# Patient Record
Sex: Female | Born: 1995 | Race: White | Hispanic: No | Marital: Single | Smoking: Current every day smoker
Health system: Southern US, Community
[De-identification: ages and names within clinical notes are randomized; demographics above are authoritative.]

## PROBLEM LIST (undated history)

## (undated) ENCOUNTER — Emergency Department: Admission: EM | Payer: Medicaid Other | Source: Home / Self Care

## (undated) ENCOUNTER — Emergency Department: Admission: EM | Payer: MEDICAID

## (undated) VITALS — BP 100/70 | HR 147 | Temp 97.7°F | Resp 17 | Ht 60.63 in | Wt 131.2 lb

## (undated) DIAGNOSIS — F909 Attention-deficit hyperactivity disorder, unspecified type: Secondary | ICD-10-CM

## (undated) DIAGNOSIS — J45909 Unspecified asthma, uncomplicated: Secondary | ICD-10-CM

## (undated) DIAGNOSIS — F419 Anxiety disorder, unspecified: Secondary | ICD-10-CM

## (undated) DIAGNOSIS — F4310 Post-traumatic stress disorder, unspecified: Secondary | ICD-10-CM

## (undated) DIAGNOSIS — F329 Major depressive disorder, single episode, unspecified: Secondary | ICD-10-CM

## (undated) DIAGNOSIS — F209 Schizophrenia, unspecified: Secondary | ICD-10-CM

## (undated) DIAGNOSIS — A6000 Herpesviral infection of urogenital system, unspecified: Secondary | ICD-10-CM

## (undated) DIAGNOSIS — B20 Human immunodeficiency virus [HIV] disease: Secondary | ICD-10-CM

## (undated) DIAGNOSIS — Z21 Asymptomatic human immunodeficiency virus [HIV] infection status: Secondary | ICD-10-CM

## (undated) HISTORY — DX: Herpesviral infection of urogenital system, unspecified: A60.00

## (undated) HISTORY — PX: NO PAST SURGERIES: SHX2092

## (undated) HISTORY — PX: COLONOSCOPY: SHX174

---
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
## 2016-04-26 DIAGNOSIS — Z7251 High risk heterosexual behavior: Secondary | ICD-10-CM | POA: Insufficient documentation

## 2016-06-17 DIAGNOSIS — W34010A Accidental discharge of airgun, initial encounter: Secondary | ICD-10-CM | POA: Insufficient documentation

## 2016-07-18 DIAGNOSIS — F603 Borderline personality disorder: Secondary | ICD-10-CM | POA: Insufficient documentation

## 2016-07-19 DIAGNOSIS — F061 Catatonic disorder due to known physiological condition: Secondary | ICD-10-CM | POA: Insufficient documentation

## 2016-07-19 DIAGNOSIS — S99922A Unspecified injury of left foot, initial encounter: Secondary | ICD-10-CM | POA: Insufficient documentation

## 2018-04-18 DIAGNOSIS — F1910 Other psychoactive substance abuse, uncomplicated: Secondary | ICD-10-CM | POA: Insufficient documentation

## 2018-07-26 DIAGNOSIS — F17210 Nicotine dependence, cigarettes, uncomplicated: Secondary | ICD-10-CM | POA: Insufficient documentation

## 2018-07-26 DIAGNOSIS — X838XXA Intentional self-harm by other specified means, initial encounter: Secondary | ICD-10-CM | POA: Insufficient documentation

## 2018-07-26 DIAGNOSIS — F1220 Cannabis dependence, uncomplicated: Secondary | ICD-10-CM | POA: Insufficient documentation

## 2019-02-21 ENCOUNTER — Ambulatory Visit: Payer: Medicaid Other | Attending: Infectious Diseases | Admitting: Infectious Diseases

## 2019-02-21 ENCOUNTER — Other Ambulatory Visit: Payer: Self-pay

## 2019-02-21 ENCOUNTER — Encounter: Payer: Self-pay | Admitting: Infectious Diseases

## 2019-02-21 VITALS — BP 106/73 | HR 96 | Temp 98.0°F | Ht 60.0 in | Wt 195.0 lb

## 2019-02-21 DIAGNOSIS — K219 Gastro-esophageal reflux disease without esophagitis: Secondary | ICD-10-CM

## 2019-02-21 DIAGNOSIS — F259 Schizoaffective disorder, unspecified: Secondary | ICD-10-CM

## 2019-02-21 DIAGNOSIS — I10 Essential (primary) hypertension: Secondary | ICD-10-CM | POA: Diagnosis not present

## 2019-02-21 DIAGNOSIS — Z91410 Personal history of adult physical and sexual abuse: Secondary | ICD-10-CM

## 2019-02-21 DIAGNOSIS — Z91013 Allergy to seafood: Secondary | ICD-10-CM

## 2019-02-21 DIAGNOSIS — J45909 Unspecified asthma, uncomplicated: Secondary | ICD-10-CM

## 2019-02-21 DIAGNOSIS — Z91010 Allergy to peanuts: Secondary | ICD-10-CM

## 2019-02-21 DIAGNOSIS — F913 Oppositional defiant disorder: Secondary | ICD-10-CM

## 2019-02-21 DIAGNOSIS — Z79899 Other long term (current) drug therapy: Secondary | ICD-10-CM

## 2019-02-21 DIAGNOSIS — G4700 Insomnia, unspecified: Secondary | ICD-10-CM

## 2019-02-21 DIAGNOSIS — B20 Human immunodeficiency virus [HIV] disease: Secondary | ICD-10-CM | POA: Insufficient documentation

## 2019-02-21 DIAGNOSIS — Z21 Asymptomatic human immunodeficiency virus [HIV] infection status: Secondary | ICD-10-CM

## 2019-02-21 DIAGNOSIS — F4310 Post-traumatic stress disorder, unspecified: Secondary | ICD-10-CM

## 2019-02-21 DIAGNOSIS — F603 Borderline personality disorder: Secondary | ICD-10-CM

## 2019-02-21 DIAGNOSIS — F17210 Nicotine dependence, cigarettes, uncomplicated: Secondary | ICD-10-CM

## 2019-02-21 DIAGNOSIS — F339 Major depressive disorder, recurrent, unspecified: Secondary | ICD-10-CM
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
## 2019-03-02 ENCOUNTER — Other Ambulatory Visit: Payer: Self-pay

## 2019-03-02 ENCOUNTER — Encounter: Payer: Self-pay | Admitting: Emergency Medicine

## 2019-03-02 ENCOUNTER — Emergency Department
Admission: EM | Admit: 2019-03-02 | Discharge: 2019-03-02 | Disposition: A | Discharge status: Home / Self Care | Payer: Medicaid Other | Attending: Emergency Medicine | Admitting: Emergency Medicine

## 2019-03-02 DIAGNOSIS — K625 Hemorrhage of anus and rectum: Secondary | ICD-10-CM | POA: Insufficient documentation

## 2019-03-02 DIAGNOSIS — Z5321 Procedure and treatment not carried out due to patient leaving prior to being seen by health care provider: Secondary | ICD-10-CM | POA: Insufficient documentation

## 2019-03-02 HISTORY — DX: Post-traumatic stress disorder, unspecified: F43.10

## 2019-03-02 HISTORY — DX: Asymptomatic human immunodeficiency virus (hiv) infection status: Z21

## 2019-03-02 HISTORY — DX: Schizophrenia, unspecified: F20.9

## 2019-03-02 HISTORY — DX: Major depressive disorder, single episode, unspecified: F32.9

## 2019-03-02 HISTORY — DX: Human immunodeficiency virus (HIV) disease: B20

## 2019-03-02 LAB — COMPREHENSIVE METABOLIC PANEL
ALT: 17 U/L (ref 0–44)
AST: 18 U/L (ref 15–41)
Albumin: 3.9 g/dL (ref 3.5–5.0)
Alkaline Phosphatase: 84 U/L (ref 38–126)
Anion gap: 6 (ref 5–15)
BUN: 15 mg/dL (ref 6–20)
CO2: 16 mmol/L — ABNORMAL LOW (ref 22–32)
Calcium: 8.5 mg/dL — ABNORMAL LOW (ref 8.9–10.3)
Chloride: 116 mmol/L — ABNORMAL HIGH (ref 98–111)
Creatinine, Ser: 0.72 mg/dL (ref 0.44–1.00)
GFR calc Af Amer: >60 mL/min (ref >60)
GFR calc non Af Amer: >60 mL/min (ref >60)
Glucose, Bld: 280 mg/dL — ABNORMAL HIGH (ref 70–99)
Potassium: 3.9 mmol/L (ref 3.5–5.1)
Sodium: 138 mmol/L (ref 135–145)
Total Bilirubin: 0.3 mg/dL (ref 0.3–1.2)
Total Protein: 7.4 g/dL (ref 6.5–8.1)

## 2019-03-02 LAB — TYPE AND SCREEN
ABO/RH(D): O NEG
Antibody Screen: NEGATIVE

## 2019-03-02 LAB — CBC
HCT: 38.8 % (ref 36.0–46.0)
Hemoglobin: 13.2 g/dL (ref 12.0–15.0)
MCH: 31 pg (ref 26.0–34.0)
MCHC: 34 g/dL (ref 30.0–36.0)
MCV: 91.1 fL (ref 80.0–100.0)
Platelets: 246 10*3/uL (ref 150–400)
RBC: 4.26 MIL/uL (ref 3.87–5.11)
RDW: 13.5 % (ref 11.5–15.5)
WBC: 6.4 10*3/uL (ref 4.0–10.5)
nRBC: 0 % (ref 0.0–0.2)

## 2019-03-02 NOTE — ED Triage Notes (Signed)
Pt to ED via POV c/o blood in stool. Pt states that she has had 3 BM's and there was a large amount of blood in all of them, pt states that she has pictures of her stools. Pt report pain with defecation. Pt is unsure if she has a hx/o hemorrhoids. Pt is in NAD at this time.

## 2019-03-02 NOTE — ED Notes (Signed)
Pt not present when rounding in [place]

## 2019-03-06 ENCOUNTER — Encounter: Payer: Self-pay | Admitting: Emergency Medicine

## 2019-03-06 ENCOUNTER — Emergency Department
Admission: EM | Admit: 2019-03-06 | Discharge: 2019-03-06 | Disposition: A | Discharge status: Home / Self Care | Payer: Medicaid Other | Attending: Emergency Medicine | Admitting: Emergency Medicine

## 2019-03-06 ENCOUNTER — Other Ambulatory Visit: Payer: Self-pay

## 2019-03-06 DIAGNOSIS — K625 Hemorrhage of anus and rectum: Secondary | ICD-10-CM | POA: Insufficient documentation

## 2019-03-06 DIAGNOSIS — J45909 Unspecified asthma, uncomplicated: Secondary | ICD-10-CM | POA: Diagnosis not present

## 2019-03-06 DIAGNOSIS — Z79899 Other long term (current) drug therapy: Secondary | ICD-10-CM | POA: Diagnosis not present

## 2019-03-06 DIAGNOSIS — Z21 Asymptomatic human immunodeficiency virus [HIV] infection status: Secondary | ICD-10-CM | POA: Insufficient documentation

## 2019-03-06 DIAGNOSIS — R739 Hyperglycemia, unspecified: Secondary | ICD-10-CM | POA: Insufficient documentation

## 2019-03-06 DIAGNOSIS — F17210 Nicotine dependence, cigarettes, uncomplicated: Secondary | ICD-10-CM | POA: Insufficient documentation

## 2019-03-06 LAB — CBC
HCT: 39.3 % (ref 36.0–46.0)
Hemoglobin: 13.6 g/dL (ref 12.0–15.0)
MCH: 31.6 pg (ref 26.0–34.0)
MCHC: 34.6 g/dL (ref 30.0–36.0)
MCV: 91.4 fL (ref 80.0–100.0)
Platelets: 246 10*3/uL (ref 150–400)
RBC: 4.3 MIL/uL (ref 3.87–5.11)
RDW: 13.6 % (ref 11.5–15.5)
WBC: 7.2 10*3/uL (ref 4.0–10.5)
nRBC: 0 % (ref 0.0–0.2)

## 2019-03-06 LAB — COMPREHENSIVE METABOLIC PANEL
ALT: 22 U/L (ref 0–44)
AST: 20 U/L (ref 15–41)
Albumin: 3.9 g/dL (ref 3.5–5.0)
Alkaline Phosphatase: 99 U/L (ref 38–126)
Anion gap: 9 (ref 5–15)
BUN: 14 mg/dL (ref 6–20)
CO2: 17 mmol/L — ABNORMAL LOW (ref 22–32)
Calcium: 8.7 mg/dL — ABNORMAL LOW (ref 8.9–10.3)
Chloride: 111 mmol/L (ref 98–111)
Creatinine, Ser: 0.77 mg/dL (ref 0.44–1.00)
GFR calc Af Amer: >60 mL/min (ref >60)
GFR calc non Af Amer: >60 mL/min (ref >60)
Glucose, Bld: 350 mg/dL — ABNORMAL HIGH (ref 70–99)
Potassium: 3.9 mmol/L (ref 3.5–5.1)
Sodium: 137 mmol/L (ref 135–145)
Total Bilirubin: 0.4 mg/dL (ref 0.3–1.2)
Total Protein: 7.6 g/dL (ref 6.5–8.1)

## 2019-03-06 LAB — POCT PREGNANCY, URINE: Preg Test, Ur: NEGATIVE
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
## 2019-03-06 NOTE — ED Notes (Addendum)
This RN spoke with Bethann Berkshire from [facility] regarding pt's DC, st pt's ride will be here in 15 minutes.

## 2019-03-06 NOTE — ED Notes (Signed)
This RN attempting to contact pt's legal guardian, Johnny Bridge at [phone] by calling 2x for permission to treat; unable to contact legal guardian, voicemail full; this Rn unable to leave a voicemail.

## 2019-03-06 NOTE — ED Triage Notes (Signed)
PT to ER with c/o blood in stools for last several days.  Pt states was here several days ago, but left due to long wait. Pt states pain with stools.

## 2019-03-06 NOTE — ED Notes (Addendum)
This RN contacted [facility]. This RN given permission to treat pt from [place]. Please call Royston Cowper at [phone] before DC.

## 2019-03-06 NOTE — ED Notes (Signed)
This RN spoke with Bethann Berkshire  regarding pt's DC

## 2019-03-27 ENCOUNTER — Emergency Department
Admission: EM | Admit: 2019-03-27 | Discharge: 2019-03-27 | Disposition: A | Discharge status: Home / Self Care | Payer: Medicaid Other | Attending: Emergency Medicine | Admitting: Emergency Medicine

## 2019-03-27 ENCOUNTER — Other Ambulatory Visit: Payer: Self-pay

## 2019-03-27 DIAGNOSIS — Z91010 Allergy to peanuts: Secondary | ICD-10-CM | POA: Diagnosis not present

## 2019-03-27 DIAGNOSIS — J45909 Unspecified asthma, uncomplicated: Secondary | ICD-10-CM | POA: Insufficient documentation

## 2019-03-27 DIAGNOSIS — R739 Hyperglycemia, unspecified: Secondary | ICD-10-CM | POA: Diagnosis not present

## 2019-03-27 DIAGNOSIS — Z79899 Other long term (current) drug therapy: Secondary | ICD-10-CM | POA: Diagnosis not present

## 2019-03-27 DIAGNOSIS — F909 Attention-deficit hyperactivity disorder, unspecified type: Secondary | ICD-10-CM | POA: Insufficient documentation

## 2019-03-27 DIAGNOSIS — F17210 Nicotine dependence, cigarettes, uncomplicated: Secondary | ICD-10-CM | POA: Insufficient documentation

## 2019-03-27 DIAGNOSIS — R42 Dizziness and giddiness: Secondary | ICD-10-CM | POA: Diagnosis present

## 2019-03-27 DIAGNOSIS — B20 Human immunodeficiency virus [HIV] disease: Secondary | ICD-10-CM

## 2019-03-27 LAB — BASIC METABOLIC PANEL
Anion gap: 10 (ref 5–15)
BUN: 16 mg/dL (ref 6–20)
CO2: 18 mmol/L — ABNORMAL LOW (ref 22–32)
Calcium: 9.1 mg/dL (ref 8.9–10.3)
Chloride: 108 mmol/L (ref 98–111)
Creatinine, Ser: 0.77 mg/dL (ref 0.44–1.00)
GFR calc Af Amer: >60 mL/min (ref >60)
GFR calc non Af Amer: >60 mL/min (ref >60)
Glucose, Bld: 211 mg/dL — ABNORMAL HIGH (ref 70–99)
Potassium: 3.9 mmol/L (ref 3.5–5.1)
Sodium: 136 mmol/L (ref 135–145)

## 2019-03-27 LAB — URINALYSIS, COMPLETE (UACMP) WITH MICROSCOPIC
Bilirubin Urine: NEGATIVE
Glucose, UA: 50 mg/dL — AB
Hgb urine dipstick: NEGATIVE
Ketones, ur: NEGATIVE mg/dL
Nitrite: NEGATIVE
Protein, ur: NEGATIVE mg/dL
Specific Gravity, Urine: 1.023 (ref 1.005–1.030)
pH: 6 (ref 5.0–8.0)

## 2019-03-27 LAB — HEMOGLOBIN A1C
Hgb A1c MFr Bld: 7.9 % — ABNORMAL HIGH (ref 4.8–5.6)
Mean Plasma Glucose: 180.03 mg/dL

## 2019-03-27 LAB — CBC
HCT: 39.9 % (ref 36.0–46.0)
Hemoglobin: 13.8 g/dL (ref 12.0–15.0)
MCH: 31.2 pg (ref 26.0–34.0)
MCHC: 34.6 g/dL (ref 30.0–36.0)
MCV: 90.3 fL (ref 80.0–100.0)
Platelets: 258 10*3/uL (ref 150–400)
RBC: 4.42 MIL/uL (ref 3.87–5.11)
RDW: 13.1 % (ref 11.5–15.5)
WBC: 6.6 10*3/uL (ref 4.0–10.5)
nRBC: 0 % (ref 0.0–0.2)

## 2019-03-27 LAB — GLUCOSE, CAPILLARY: Glucose-Capillary: 204 mg/dL — ABNORMAL HIGH (ref 70–99)

## 2019-03-27 LAB — POCT PREGNANCY, URINE: Preg Test, Ur: NEGATIVE

## 2019-03-27 MED ORDER — SODIUM CHLORIDE 0.9% FLUSH
3.0000 mL | Freq: Once | INTRAVENOUS | Status: AC
  Administered 2019-03-27: 19:00:00 3 mL via INTRAVENOUS

## 2019-03-27 MED ORDER — LACTATED RINGERS IV BOLUS
1000.0000 mL | Freq: Once | INTRAVENOUS | Status: AC
  Administered 2019-03-27: 18:00:00 1000 mL via INTRAVENOUS

## 2019-03-27 NOTE — Discharge Instructions (Signed)
Your elevated blood sugar is concerning for new onset diabetes.  We will hold off on starting any medication for this right now as there is risk for interaction with your HIV medications, but you should follow-up with your primary care doctor and also schedule an appointment for an endocrinologist.  In the meantime, you may manage your blood glucose levels with diet and exercise, please try to avoid sugary foods or large servings of carbohydrates.  Please return to the ER for new or worsening symptoms.

## 2019-03-27 NOTE — ED Notes (Signed)
Pt provided with meal tray by Claiborne Billings, RN.

## 2019-03-27 NOTE — ED Provider Notes (Signed)
[facility] Emergency Department Provider Note   ____________________________________________   First MD Initiated Contact with Patient 03/27/19 1728     (approximate)  I have reviewed the triage vital signs and the nursing notes.   HISTORY  Chief Complaint Dizziness    HPI Crystal Black is a 23 y.o. female with past medical history of schizophrenia, HIV, who presents to the ED complaining of dizziness.  Patient reports that she has been frequently been feeling dizzy and lightheaded over the past couple of weeks.  She was seen in the ED for similar symptoms a couple weeks ago, told that her blood sugar was elevated and she needed to follow-up with her PCP.  Patient states that her PCP saw her at the group home and she was post have more blood work done, but this has not occurred.  Patient states she does not have any follow-up scheduled with her PCP and she is not sure what to do next.  She states she has been checking her blood sugar recently and it has varied from 220-280.  She denies any fevers, chills, chest pain, cough, shortness of breath, abdominal pain, nausea, or vomiting.  She has not had any dysuria or hematuria.        Past Medical History:  Diagnosis Date  . ADHD (attention deficit hyperactivity disorder)   . Anxiety   . Asthma   . HIV (human immunodeficiency virus infection) (HCC)   . MDD (major depressive disorder)   . PTSD (post-traumatic stress disorder)   . Schizophrenia [facility](HCC)     Patient Active Problem List   Diagnosis Date Noted  . HIV disease (HCC) 02/21/2019  . Post traumatic stress disorder (PTSD) 11/21/2013  . ODD (oppositional defiant disorder) 11/21/2013  . MDD (major depressive disorder), recurrent episode, moderate (HCC) 11/20/2013    History reviewed. No pertinent surgical history.  Prior to Admission medications   Medication Sig Start Date End Date Taking? Authorizing Provider  albuterol (VENTOLIN HFA) 108 (90  Base) MCG/ACT inhaler Inhale 2 puffs into the lungs every 4 (four) hours.    [provider]  darunavir-cobicistat (PREZCOBIX) 800-150 MG tablet Take 1 tablet by mouth. 12/08/18   [provider]  divalproex (DEPAKOTE) 500 MG DR tablet Take 1 tablet by mouth 2 (two) times daily. 08/30/18   [provider]  dolutegravir (TIVICAY) 50 MG tablet Take 1 tablet by mouth daily. 12/11/18   [provider]  etonogestrel (NEXPLANON) 68 MG IMPL implant 68 mg by Subdermal route once.    [provider]  paliperidone (INVEGA SUSTENNA) 156 MG/ML SUSY injection Inject 156 mg into the muscle every 28 (twenty-eight) days.    [provider]  polyethylene glycol powder (GLYCOLAX/MIRALAX) 17 GM/SCOOP powder Take 17 g by mouth 2 (two) times daily as needed.    [provider]  risperiDONE (RISPERDAL) 1 MG tablet Take 1 tablet (1 mg total) by mouth daily. 11/27/13   Kendrick FriesBlankmann, Meghan, NP  risperiDONE (RISPERDAL) 2 MG tablet Take 1 tablet (2 mg total) by mouth at bedtime. 11/27/13   Kendrick FriesBlankmann, Meghan, NP  sertraline (ZOLOFT) 50 MG tablet Take 1 tablet (50 mg total) by mouth at bedtime. 11/27/13   Kendrick FriesBlankmann, Meghan, NP  traZODone (DESYREL) 100 MG tablet Take 100 mg by mouth at bedtime as needed for sleep.    [provider]    Allergies Fish allergy and Peanut oil  Family History  Problem Relation Age of Onset  . Drug abuse Mother  Social History Social History   Tobacco Use  . Smoking status: Current Every Day Smoker    Packs/day: 1.00    Years: 10.00    Pack years: 10.00    Types: Cigarettes  . Smokeless tobacco: Never Used  Substance Use Topics  . Alcohol use: No  . Drug use: No    Review of Systems  Constitutional: No fever/chills Eyes: No visual changes. ENT: No sore throat. Cardiovascular: Denies chest pain. Respiratory: Denies shortness of breath. Gastrointestinal: No abdominal pain.  No nausea, no vomiting.  No diarrhea.  No  constipation. Genitourinary: Negative for dysuria. Musculoskeletal: Negative for back pain. Skin: Negative for rash. Neurological: Negative for headaches, focal weakness or numbness.  Positive for dizziness.  ____________________________________________   PHYSICAL EXAM:  VITAL SIGNS: ED Triage Vitals  Enc Vitals Group     BP 03/27/19 1622 112/67     Pulse Rate 03/27/19 1622 95     Resp 03/27/19 1622 18     Temp 03/27/19 1622 98.2 F (36.8 C)     Temp Source 03/27/19 1622 Oral     SpO2 03/27/19 1622 97 %     Weight 03/27/19 1623 199 lb 1.2 oz (90.3 kg)     Height 03/27/19 1623 5\' 1"  (1.549 m)     Head Circumference --      Peak Flow --      Pain Score 03/27/19 1623 0     Pain Loc --      Pain Edu? --      Excl. in [place]? --     Constitutional: Alert and oriented. Eyes: Conjunctivae are normal. Head: Atraumatic. Nose: No congestion/rhinnorhea. Mouth/Throat: Mucous membranes are moist. Neck: Normal ROM Cardiovascular: Normal rate, regular rhythm. Grossly normal heart sounds. Respiratory: Normal respiratory effort.  No retractions. Lungs CTAB. Gastrointestinal: Soft and nontender. No distention. Genitourinary: deferred Musculoskeletal: No lower extremity tenderness nor edema. Neurologic:  Normal speech and language. No gross focal neurologic deficits are appreciated. Skin:  Skin is warm, dry and intact. No rash noted. Psychiatric: Mood and affect are normal. Speech and behavior are normal.  ____________________________________________   LABS (all labs ordered are listed, but only abnormal results are displayed)  Labs Reviewed  GLUCOSE, CAPILLARY - Abnormal; Notable for the following components:      Result Value   Glucose-Capillary 204 (*)    All other components within normal limits  BASIC METABOLIC PANEL - Abnormal; Notable for the following components:   CO2 18 (*)    Glucose, Bld 211 (*)    All other components within normal limits  URINALYSIS, COMPLETE  (UACMP) WITH MICROSCOPIC - Abnormal; Notable for the following components:   Color, Urine YELLOW (*)    APPearance CLOUDY (*)    Glucose, UA 50 (*)    Leukocytes,Ua SMALL (*)    Bacteria, UA MANY (*)    All other components within normal limits  CBC  HEMOGLOBIN A1C  CBG MONITORING, ED  POC URINE PREG, ED  POCT PREGNANCY, URINE   ____________________________________________  EKG  ED ECG REPORT I, Blake Divine, the attending physician, personally viewed and interpreted this ECG.   Date: 03/27/2019  EKG Time: 16:32  Rate: 92  Rhythm: normal sinus rhythm  Axis: Normal  Intervals:none  ST&T Change: None    PROCEDURES  Procedure(s) performed (including Critical Care):  Procedures   ____________________________________________   INITIAL IMPRESSION / ASSESSMENT AND PLAN / ED COURSE       23 year old female presenting to the ED  with 3 weeks of ongoing dizziness and lightheadedness, has repeatedly found herself to be hyperglycemic at home.  EKG without evidence of arrhythmia or ischemia, doubt cardiac etiology of symptoms.  Labs significant for hyperglycemia, patient has not yet been officially diagnosed with diabetes.  There is some mild acidosis, but no anion gap and UA is negative for ketones, doubt DKA.  Will hydrate with IV fluids as dehydration is likely contributing to lightheadedness.  Remainder of labs unremarkable.  Ideally, patient would be started on a medication for her apparent new onset diabetes, however this becomes more complicated with her HIV medications.  Counseled patient to focus on diet and exercise for now and will provide referral to endocrinology.  Also counseled patient to follow-up closely with her PCP.  Will send hemoglobin A1c.  Need for follow-up was discussed with manager at patient's group home, who understands and will assist with follow-up.      ____________________________________________   FINAL CLINICAL IMPRESSION(S) / ED DIAGNOSES   Final diagnoses:  Hyperglycemia  HIV disease [facility])     ED Discharge Orders    None       Note:  This document was prepared using Dragon voice recognition software and may include unintentional dictation errors.   Chesley Noon, MD 03/27/19 2002

## 2019-03-27 NOTE — ED Triage Notes (Signed)
Pt states her blood sugar has been elevated and her md was supposed to do more lab work but has not. Pt says she has been dizzy. Pt saw md last week and has been taking her blood sugar at the group home which pt states 200s. Pt states she was not placed on a special diet. Pt has been limiting sweets. Pt lives at a group home and staff were at ER.

## 2019-03-27 NOTE — ED Notes (Signed)
This RN spoke with the group home owner, Randal Buba, about pts follow up care with the endocrinologist. This RN stressed the importance of ensuring the pt goes to follow-up appointment to her. This RN also made her aware that she will be notified when pt is ready for discharge.

## 2019-03-27 NOTE — ED Notes (Signed)
Attempted to contact legal guardian Joaquin Music and was forwarded to voicemail. Randal Buba, owner of pts residence [place] called and notified of patients discharge. She stated that a vehicle would be here in approx 30 minutes to pick up the patient.

## 2019-03-27 NOTE — ED Notes (Signed)
Received permission from pt for this RN to speak to group home owner, Randal Buba, about follow up care with the endocrinologist.

## 2019-04-09 ENCOUNTER — Other Ambulatory Visit: Payer: Self-pay

## 2019-04-09 ENCOUNTER — Emergency Department
Admission: EM | Admit: 2019-04-09 | Discharge: 2019-04-09 | Disposition: A | Discharge status: Home / Self Care | Payer: Medicaid Other | Attending: Student in an Organized Health Care Education/Training Program | Admitting: Student in an Organized Health Care Education/Training Program

## 2019-04-09 DIAGNOSIS — R42 Dizziness and giddiness: Secondary | ICD-10-CM | POA: Diagnosis not present

## 2019-04-09 DIAGNOSIS — F17210 Nicotine dependence, cigarettes, uncomplicated: Secondary | ICD-10-CM | POA: Insufficient documentation

## 2019-04-09 DIAGNOSIS — Z79899 Other long term (current) drug therapy: Secondary | ICD-10-CM | POA: Diagnosis not present

## 2019-04-09 DIAGNOSIS — Z21 Asymptomatic human immunodeficiency virus [HIV] infection status: Secondary | ICD-10-CM | POA: Diagnosis not present

## 2019-04-09 DIAGNOSIS — N3090 Cystitis, unspecified without hematuria: Secondary | ICD-10-CM | POA: Diagnosis not present

## 2019-04-09 DIAGNOSIS — J45909 Unspecified asthma, uncomplicated: Secondary | ICD-10-CM | POA: Insufficient documentation

## 2019-04-09 DIAGNOSIS — E119 Type 2 diabetes mellitus without complications: Secondary | ICD-10-CM | POA: Insufficient documentation

## 2019-04-09 LAB — URINALYSIS, COMPLETE (UACMP) WITH MICROSCOPIC
Bilirubin Urine: NEGATIVE
Glucose, UA: NEGATIVE mg/dL
Hgb urine dipstick: NEGATIVE
Ketones, ur: NEGATIVE mg/dL
Nitrite: POSITIVE — AB
Protein, ur: NEGATIVE mg/dL
Specific Gravity, Urine: 1.014 (ref 1.005–1.030)
pH: 5 (ref 5.0–8.0)

## 2019-04-09 LAB — BASIC METABOLIC PANEL
Anion gap: 9 (ref 5–15)
BUN: 16 mg/dL (ref 6–20)
CO2: 17 mmol/L — ABNORMAL LOW (ref 22–32)
Calcium: 8.7 mg/dL — ABNORMAL LOW (ref 8.9–10.3)
Chloride: 111 mmol/L (ref 98–111)
Creatinine, Ser: 0.82 mg/dL (ref 0.44–1.00)
GFR calc Af Amer: >60 mL/min (ref >60)
GFR calc non Af Amer: >60 mL/min (ref >60)
Glucose, Bld: 90 mg/dL (ref 70–99)
Potassium: 3.6 mmol/L (ref 3.5–5.1)
Sodium: 137 mmol/L (ref 135–145)

## 2019-04-09 LAB — CBC
HCT: 39.6 % (ref 36.0–46.0)
Hemoglobin: 13.6 g/dL (ref 12.0–15.0)
MCH: 31.3 pg (ref 26.0–34.0)
MCHC: 34.3 g/dL (ref 30.0–36.0)
MCV: 91 fL (ref 80.0–100.0)
Platelets: 264 10*3/uL (ref 150–400)
RBC: 4.35 MIL/uL (ref 3.87–5.11)
RDW: 13.2 % (ref 11.5–15.5)
WBC: 7.1 10*3/uL (ref 4.0–10.5)
nRBC: 0 % (ref 0.0–0.2)

## 2019-04-09 LAB — GLUCOSE, CAPILLARY: Glucose-Capillary: 96 mg/dL (ref 70–99)

## 2019-04-09 LAB — POCT PREGNANCY, URINE: Preg Test, Ur: NEGATIVE

## 2019-04-09 MED ORDER — SODIUM CHLORIDE 0.9% FLUSH: 3.0000 mL | Freq: Once | INTRAVENOUS | Status: DC

## 2019-04-09 MED ORDER — CEPHALEXIN 500 MG PO CAPS
500.0000 mg | ORAL_CAPSULE | Freq: Once | ORAL | Status: AC
  Administered 2019-04-09: 500 mg via ORAL
  Filled 2019-04-09: qty 1

## 2019-04-09 MED ORDER — CEPHALEXIN 500 MG PO CAPS: 500.0000 mg | ORAL_CAPSULE | Freq: Three times a day (TID) | ORAL | 0 refills | Status: AC

## 2019-04-09 MED ORDER — SODIUM CHLORIDE 0.9 % IV BOLUS
1000.0000 mL | Freq: Once | INTRAVENOUS | Status: AC
  Administered 2019-04-09: 21:00:00 1000 mL via INTRAVENOUS

## 2019-04-09 NOTE — ED Provider Notes (Signed)
[facility]    First MD Initiated Contact with Patient 04/09/19 2033     (approximate)  I have reviewed the triage vital signs and the nursing notes.   HISTORY  Chief Complaint dizziness   HPI Crystal Black is a 23 y.o. female below listed medical history and recent diagnosis of type 2 diabetes presents the ER for lightheadedness.  Denies any pain.  No nausea or vomiting.  States she had decreased appetite today and has had increased urine frequency.  Was started on metformin by her PCP.  Has been compliant with that medication.  Denies any fevers.  No shortness of breath.  No diarrhea.   States that she has had some discomfort and dysuria with urination.   Past Medical History:  Diagnosis Date  . ADHD (attention deficit hyperactivity disorder)   . Anxiety   . Asthma   . HIV (human immunodeficiency virus infection) (HCC)   . MDD (major depressive disorder)   . PTSD (post-traumatic stress disorder)   . Schizophrenia (HCC)    Family History  Problem Relation Age of Onset  . Drug abuse Mother    History reviewed. No pertinent surgical history. Patient Active Problem List   Diagnosis Date Noted  . HIV disease (HCC) 02/21/2019  . Post traumatic stress disorder (PTSD) 11/21/2013  . ODD (oppositional defiant disorder) 11/21/2013  . MDD (major depressive disorder), recurrent episode, moderate (HCC) 11/20/2013      Prior to Admission medications   Medication Sig Start Date End Date Taking? Authorizing Provider  albuterol (VENTOLIN HFA) 108 (90 Base) MCG/ACT inhaler Inhale 2 puffs into the lungs every 4 (four) hours.    [provider]  darunavir-cobicistat (PREZCOBIX) 800-150 MG tablet Take 1 tablet by mouth. 12/08/18   [provider]  divalproex (DEPAKOTE) 500 MG DR tablet Take 1 tablet by mouth 2 (two) times daily. 08/30/18   [provider]  dolutegravir (TIVICAY) 50 MG tablet Take 1  tablet by mouth daily. 12/11/18   [provider]  etonogestrel (NEXPLANON) 68 MG IMPL implant 68 mg by Subdermal route once.    [provider]  paliperidone (INVEGA SUSTENNA) 156 MG/ML SUSY injection Inject 156 mg into the muscle every 28 (twenty-eight) days.    [provider]  polyethylene glycol powder (GLYCOLAX/MIRALAX) 17 GM/SCOOP powder Take 17 g by mouth 2 (two) times daily as needed.    [provider]  risperiDONE (RISPERDAL) 1 MG tablet Take 1 tablet (1 mg total) by mouth daily. 11/27/13   Kendrick FriesBlankmann, Meghan, NP  risperiDONE (RISPERDAL) 2 MG tablet Take 1 tablet (2 mg total) by mouth at bedtime. 11/27/13   Kendrick FriesBlankmann, Meghan, NP  sertraline (ZOLOFT) 50 MG tablet Take 1 tablet (50 mg total) by mouth at bedtime. 11/27/13   Kendrick FriesBlankmann, Meghan, NP  traZODone (DESYREL) 100 MG tablet Take 100 mg by mouth at bedtime as needed for sleep.    [provider]    Allergies Fish allergy and Peanut oil    Social History Social History   Tobacco Use  . Smoking status: Current Every Day Smoker    Packs/day: 1.00    Years: 10.00    Pack years: 10.00    Types: Cigarettes  . Smokeless tobacco: Never Used  Substance Use Topics  . Alcohol use: No  . Drug use: No    Review of Systems Patient denies headaches, rhinorrhea, blurry vision, numbness, shortness of breath, chest pain, edema, cough, abdominal pain, nausea,  vomiting, diarrhea, dysuria, fevers, rashes or hallucinations unless otherwise stated above in HPI. ____________________________________________   PHYSICAL EXAM:  VITAL SIGNS: Vitals:   04/09/19 2043 04/09/19 2044  BP: (!) 133/97   Pulse:  92  Resp:    Temp:    SpO2:  99%    Constitutional: Alert and oriented.  Eyes: Conjunctivae are normal.  Head: Atraumatic. Nose: No congestion/rhinnorhea. Mouth/Throat: Mucous membranes are moist.   Neck: No stridor. Painless ROM.  Cardiovascular: Normal rate, regular rhythm. Grossly normal  heart sounds.  Good peripheral circulation. Respiratory: Normal respiratory effort.  No retractions. Lungs CTAB. Gastrointestinal: Soft and nontender. No distention. No abdominal bruits. No CVA tenderness. Genitourinary:  Musculoskeletal: No lower extremity tenderness nor edema.  No joint effusions. Neurologic:  Normal speech and language. No gross focal neurologic deficits are appreciated. No facial droop Skin:  Skin is warm, dry and intact. No rash noted. Psychiatric: Mood and affect are normal. Speech and behavior are normal.  ____________________________________________   LABS (all labs ordered are listed, but only abnormal results are displayed)  Results for orders placed or performed during the hospital encounter of 04/09/19 (from the past 24 hour(s))  Glucose, capillary     Status: None   Collection Time: 04/09/19  6:57 PM  Result Value Ref Range   Glucose-Capillary 96 70 - 99 mg/dL  Basic metabolic panel     Status: Abnormal   Collection Time: 04/09/19  6:57 PM  Result Value Ref Range   Sodium 137 135 - 145 mmol/L   Potassium 3.6 3.5 - 5.1 mmol/L   Chloride 111 98 - 111 mmol/L   CO2 17 (L) 22 - 32 mmol/L   Glucose, Bld 90 70 - 99 mg/dL   BUN 16 6 - 20 mg/dL   Creatinine, Ser 0.26 0.44 - 1.00 mg/dL   Calcium 8.7 (L) 8.9 - 10.3 mg/dL   GFR calc non Af Amer >60 >60 mL/min   GFR calc Af Amer >60 >60 mL/min   Anion gap 9 5 - 15  CBC     Status: None   Collection Time: 04/09/19  6:57 PM  Result Value Ref Range   WBC 7.1 4.0 - 10.5 K/uL   RBC 4.35 3.87 - 5.11 MIL/uL   Hemoglobin 13.6 12.0 - 15.0 g/dL   HCT 37.8 58.8 - 50.2 %   MCV 91.0 80.0 - 100.0 fL   MCH 31.3 26.0 - 34.0 pg   MCHC 34.3 30.0 - 36.0 g/dL   RDW 77.4 12.8 - 78.6 %   Platelets 264 150 - 400 K/uL   nRBC 0.0 0.0 - 0.2 %  Urinalysis, Complete w Microscopic     Status: Abnormal   Collection Time: 04/09/19  6:57 PM  Result Value Ref Range   Color, Urine YELLOW (A) YELLOW   APPearance HAZY (A) CLEAR    Specific Gravity, Urine 1.014 1.005 - 1.030   pH 5.0 5.0 - 8.0   Glucose, UA NEGATIVE NEGATIVE mg/dL   Hgb urine dipstick NEGATIVE NEGATIVE   Bilirubin Urine NEGATIVE NEGATIVE   Ketones, ur NEGATIVE NEGATIVE mg/dL   Protein, ur NEGATIVE NEGATIVE mg/dL   Nitrite POSITIVE (A) NEGATIVE   Leukocytes,Ua SMALL (A) NEGATIVE   RBC / HPF 0-5 0 - 5 RBC/hpf   WBC, UA 0-5 0 - 5 WBC/hpf   Bacteria, UA MANY (A) NONE SEEN   Squamous Epithelial / LPF 6-10 0 - 5   Mucus PRESENT   Pregnancy, urine POC     Status:  None   Collection Time: 04/09/19  9:10 PM  Result Value Ref Range   Preg Test, Ur NEGATIVE NEGATIVE   ____________________________________________  EKG My review and personal interpretation at Time:   19:05 Indication: dizziness  Rate: 100  Rhythm: sinus Axis: normal Other: normal intervals, no stemi ____________________________________________  RADIOLOGY  I personally reviewed all radiographic images ordered to evaluate for the above acute complaints and reviewed radiology reports and findings.  These findings were personally discussed with the patient.  Please see medical record for radiology report.  ____________________________________________   PROCEDURES  Procedure(s) performed:  Procedures    Critical Care performed: no ____________________________________________   INITIAL IMPRESSION / ASSESSMENT AND PLAN / ED COURSE  Pertinent labs & imaging results that were available during my care of the patient were reviewed by me and considered in my medical decision making (see chart for details).   DDX: Dehydration, electrolyte abnormality, DKA, HHS, cystitis  Crystal Black is a 23 y.o. who presents to the ED with symptoms as described above.  Patient is well-appearing in no acute distress.  No signs of DKA.  Will give IV fluids.  Tolerating oral hydration.  Is complaining of some dysuria and does have evidence of cystitis.  No flank pain.  Not consistent with pyelonephritis.   Patient stable appropriate for outpatient follow-up.     The patient was evaluated in Emergency Department today for the symptoms described in the history of present illness. He/she was evaluated in the context of the global COVID-19 pandemic, which necessitated consideration that the patient might be at risk for infection with the SARS-CoV-2 virus that causes COVID-19. Institutional protocols and algorithms that pertain to the evaluation of patients at risk for COVID-19 are in a state of rapid change based on information released by regulatory bodies including the CDC and federal and state organizations. These policies and algorithms were followed during the patient's care in the ED.  As part of my medical decision making, I reviewed the following data within the [place] notes reviewed and incorporated, Labs reviewed, notes from prior ED visits and [place] Controlled Substance Database   ____________________________________________   FINAL CLINICAL IMPRESSION(S) / ED DIAGNOSES  Final diagnoses:  Dizziness  Cystitis      NEW MEDICATIONS STARTED DURING THIS VISIT:  New Prescriptions   No medications on file     Note:  This document was prepared using Dragon voice recognition software and may include unintentional dictation errors.    Merlyn Lot, MD 04/09/19 2138

## 2019-04-09 NOTE — ED Triage Notes (Signed)
Pt comes via POV from home with c/o possible hypoglycemic or hyperglycemic.  Pt states she was dx about 2 weeks ago with Type 2 diabetes. Pt states her Bp was low yesterday.  Pt states some dizziness and feeling like she might pass out.

## 2019-04-09 NOTE — ED Notes (Signed)
POC preg preformed with Negative results.

## 2019-04-09 NOTE — ED Notes (Signed)
Patient visualized in lobby in no acute distress. Awaiting room for MD eval. Lab results reviewed. Awaiting urine specimen from patient.

## 2019-04-09 NOTE — ED Notes (Signed)
[facility] made aware of patient being discharged. Worker at group states will have someone come and pick patient up.

## 2019-04-09 NOTE — ED Notes (Signed)
Diet sandwich tray given with a diet lemon lime soda.

## 2019-04-09 NOTE — ED Notes (Signed)
Attempted to call legal guardian Joaquin Music for pt. Left voicemail message with pt's status

## 2019-04-09 NOTE — ED Notes (Signed)
HIPPA compliant voicemail message left with Gaurdian

## 2019-04-09 NOTE — Discharge Instructions (Addendum)
Be sure to drink plenty of fluids.  Please start this antibiotic tomorrow morning as your urine does appear to have an infection.  This also may help with your symptoms.  Follow-up with PCP.  Return for any additional questions or concerns.

## 2019-04-09 NOTE — ED Notes (Signed)
Patient states she was recently dx with diabetes and started feeling dizzy today so she ate something. Patient stats that did not help and is afraid she is having adverse reactions with metformin and her HIV medications being mixed.

## 2019-05-28 ENCOUNTER — Other Ambulatory Visit
Admission: RE | Admit: 2019-05-28 | Discharge: 2019-05-28 | Disposition: A | Discharge status: Home / Self Care | Payer: Medicaid Other | Source: Ambulatory Visit | Attending: Internal Medicine | Admitting: Internal Medicine

## 2019-05-28 ENCOUNTER — Encounter: Payer: Self-pay | Admitting: *Deleted

## 2019-05-28 DIAGNOSIS — Z20828 Contact with and (suspected) exposure to other viral communicable diseases: Secondary | ICD-10-CM | POA: Diagnosis not present

## 2019-05-28 DIAGNOSIS — Z01812 Encounter for preprocedural laboratory examination: Secondary | ICD-10-CM | POA: Insufficient documentation

## 2019-05-28 LAB — SARS CORONAVIRUS 2 (TAT 6-24 HRS): SARS Coronavirus 2: NEGATIVE

## 2019-05-29 ENCOUNTER — Encounter
Admission: RE | Disposition: A | Discharge status: Home / Self Care | Payer: Self-pay | Source: Home / Self Care | Attending: Internal Medicine

## 2019-05-29 ENCOUNTER — Ambulatory Visit: Payer: Medicaid Other | Admitting: Anesthesiology

## 2019-05-29 ENCOUNTER — Ambulatory Visit
Admission: RE | Admit: 2019-05-29 | Discharge: 2019-05-29 | Disposition: A | Discharge status: Home / Self Care | Payer: Medicaid Other | Attending: Internal Medicine | Admitting: Internal Medicine

## 2019-05-29 DIAGNOSIS — F209 Schizophrenia, unspecified: Secondary | ICD-10-CM | POA: Insufficient documentation

## 2019-05-29 DIAGNOSIS — F329 Major depressive disorder, single episode, unspecified: Secondary | ICD-10-CM | POA: Diagnosis not present

## 2019-05-29 DIAGNOSIS — K640 First degree hemorrhoids: Secondary | ICD-10-CM | POA: Diagnosis not present

## 2019-05-29 DIAGNOSIS — F17210 Nicotine dependence, cigarettes, uncomplicated: Secondary | ICD-10-CM | POA: Diagnosis not present

## 2019-05-29 DIAGNOSIS — K625 Hemorrhage of anus and rectum: Secondary | ICD-10-CM | POA: Diagnosis present

## 2019-05-29 DIAGNOSIS — K921 Melena: Secondary | ICD-10-CM | POA: Diagnosis not present

## 2019-05-29 DIAGNOSIS — F4310 Post-traumatic stress disorder, unspecified: Secondary | ICD-10-CM | POA: Diagnosis not present

## 2019-05-29 DIAGNOSIS — F419 Anxiety disorder, unspecified: Secondary | ICD-10-CM | POA: Diagnosis not present

## 2019-05-29 DIAGNOSIS — Z21 Asymptomatic human immunodeficiency virus [HIV] infection status: Secondary | ICD-10-CM | POA: Diagnosis not present

## 2019-05-29 DIAGNOSIS — Z79899 Other long term (current) drug therapy: Secondary | ICD-10-CM | POA: Insufficient documentation

## 2019-05-29 HISTORY — PX: COLONOSCOPY WITH PROPOFOL: SHX5780

## 2019-05-29 HISTORY — DX: Post-traumatic stress disorder, unspecified: F43.10

## 2019-05-29 LAB — GLUCOSE, CAPILLARY: Glucose-Capillary: 90 mg/dL (ref 70–99)

## 2019-05-29 SURGERY — COLONOSCOPY WITH PROPOFOL
Anesthesia: General

## 2019-05-29 MED ORDER — SODIUM CHLORIDE 0.9 % IV SOLN
INTRAVENOUS | Status: DC
  Administered 2019-05-29: 11:00:00 via INTRAVENOUS

## 2019-05-29 MED ORDER — PROPOFOL 500 MG/50ML IV EMUL
INTRAVENOUS | Status: DC | PRN
  Administered 2019-05-29: 100 ug/kg/min via INTRAVENOUS

## 2019-05-29 MED ORDER — PHENYLEPHRINE HCL (PRESSORS) 10 MG/ML IV SOLN
INTRAVENOUS | Status: DC | PRN
  Administered 2019-05-29: 100 ug via INTRAVENOUS
  Administered 2019-05-29: 200 ug via INTRAVENOUS
  Administered 2019-05-29: 100 ug via INTRAVENOUS
  Administered 2019-05-29: 200 ug via INTRAVENOUS

## 2019-05-29 MED ORDER — PROPOFOL 500 MG/50ML IV EMUL
INTRAVENOUS | Status: AC
  Filled 2019-05-29: qty 50

## 2019-05-29 MED ORDER — LIDOCAINE HCL (PF) 1 % IJ SOLN
INTRAMUSCULAR | Status: AC
  Filled 2019-05-29: qty 2

## 2019-05-29 MED ORDER — PROPOFOL 10 MG/ML IV BOLUS
INTRAVENOUS | Status: DC | PRN
  Administered 2019-05-29: 40 mg via INTRAVENOUS

## 2019-05-29 NOTE — Interval H&P Note (Signed)
History and Physical Interval Note:  05/29/2019 10:32 AM  Crystal Black  has presented today for surgery, with the diagnosis of Rectal bleeding.  The various methods of treatment have been discussed with the patient and family. After consideration of risks, benefits and other options for treatment, the patient has consented to  Procedure(s): COLONOSCOPY WITH PROPOFOL (N/A) as a surgical intervention.  The patient's history has been reviewed, patient examined, no change in status, stable for surgery.  I have reviewed the patient's chart and labs.  Questions were answered to the patient's satisfaction.     [place], [place]
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
## 2019-05-29 NOTE — Anesthesia Post-op Follow-up Note (Signed)
Anesthesia QCDR form completed.        

## 2019-05-29 NOTE — Transfer of Care (Signed)
Immediate Anesthesia Transfer of Care Note  Patient: Crystal Black  Procedure(s) Performed: COLONOSCOPY WITH PROPOFOL (N/A )  Patient Location: PACU  Anesthesia Type:General  Level of Consciousness: sedated  Airway & Oxygen Therapy: Patient Spontanous Breathing and Patient connected to nasal cannula oxygen  Post-op Assessment: Report given to RN and Post -op Vital signs reviewed and stable  Post vital signs: Reviewed and stable  Last Vitals:  Vitals Value Taken Time  BP 102/54 05/29/19 1134  Temp 36.6 C 05/29/19 1132  Pulse 77 05/29/19 1138  Resp 12 05/29/19 1138  SpO2 100 % 05/29/19 1138  Vitals shown include unvalidated device data.  Last Pain:  Vitals:   05/29/19 1132  TempSrc: Temporal  PainSc: Asleep         Complications: No apparent anesthesia complications

## 2019-05-29 NOTE — Anesthesia Postprocedure Evaluation (Signed)
Anesthesia Post Note  Patient: Crystal Black  Procedure(s) Performed: COLONOSCOPY WITH PROPOFOL (N/A )  Patient location during evaluation: Endoscopy Anesthesia Type: General Level of consciousness: awake and alert and oriented Pain management: pain level controlled Vital Signs Assessment: post-procedure vital signs reviewed and stable Respiratory status: spontaneous breathing Cardiovascular status: blood pressure returned to baseline Anesthetic complications: no     Last Vitals:  Vitals:   05/29/19 0906 05/29/19 1132  BP: 109/70 (!) 102/54  Pulse: 95   Resp: 19   Temp: (!) 36.1 C 36.6 C  SpO2: 99%     Last Pain:  Vitals:   05/29/19 1152  TempSrc:   PainSc: 0-No pain                 Kalvin Buss

## 2019-05-29 NOTE — Anesthesia Preprocedure Evaluation (Addendum)
Anesthesia Evaluation  Patient identified by MRN, date of birth, ID band Patient awake    Reviewed: Allergy & Precautions, NPO status , Patient's Chart, lab work & pertinent test results  History of Anesthesia Complications Negative for: history of anesthetic complications  Airway Mallampati: II       Dental   Pulmonary asthma , neg sleep apnea, neg COPD, Current Smoker,           Cardiovascular (-) hypertension(-) Past MI and (-) CHF (-) dysrhythmias (-) Valvular Problems/Murmurs     Neuro/Psych neg Seizures Anxiety Depression Schizophrenia    GI/Hepatic Neg liver ROS, neg GERD  ,  Endo/Other  neg diabetes  Renal/GU negative Renal ROS     Musculoskeletal   Abdominal   Peds  Hematology   Anesthesia Other Findings   Reproductive/Obstetrics                            Anesthesia Physical Anesthesia Plan  ASA: III  Anesthesia Plan: General   Post-op Pain Management:    Induction: Intravenous  PONV Risk Score and Plan: 2 and TIVA and Propofol infusion  Airway Management Planned: Nasal Cannula  Additional Equipment:   Intra-op Plan:   Post-operative Plan:   Informed Consent: I have reviewed the patients History and Physical, chart, labs and discussed the procedure including the risks, benefits and alternatives for the proposed anesthesia with the patient or authorized representative who has indicated his/her understanding and acceptance.       Plan Discussed with:   Anesthesia Plan Comments:         Anesthesia Quick Evaluation

## 2019-05-29 NOTE — Op Note (Signed)
[facility] Gastroenterology Patient Name: Crystal Black Procedure Date: 05/29/2019 11:09 AM MRN: [id number] Account #: [id number] Date of Birth: 12/04/1995 Admit Type: Outpatient Age: [age] [facility] Gender: Female Note Status: Finalized Procedure:             Colonoscopy Indications:           Hematochezia Providers:             Boykin Nearingeodoro K. Norma Fredricksonoledo MD, MD Referring MD:          No Local Md, MD (Referring MD) Medicines:             Propofol per Anesthesia Complications:         No immediate complications. Procedure:             Pre-Anesthesia Assessment:                        - The risks and benefits of the procedure and the                         sedation options and risks were discussed with the                         patient. All questions were answered and informed                         consent was obtained.                        - Patient identification and proposed procedure were                         verified prior to the procedure by the nurse. The                         procedure was verified in the procedure room.                        - ASA Grade Assessment: II - A patient with mild                         systemic disease.                        - After reviewing the risks and benefits, the patient                         was deemed in satisfactory condition to undergo the                         procedure.                        After obtaining informed consent, the colonoscope was                         passed under direct vision. Throughout the procedure,                         the patient's blood pressure, pulse, and oxygen  saturations were monitored continuously. The                         Colonoscope was introduced through the anus and                         advanced to the the terminal ileum, with                         identification of the appendiceal orifice and IC   valve. The colonoscopy was performed without                         difficulty. The patient tolerated the procedure well.                         The quality of the bowel preparation was adequate. Findings:      The perianal and digital rectal examinations were normal. Pertinent       negatives include normal sphincter tone and no palpable rectal lesions.      Non-bleeding internal hemorrhoids were found during retroflexion. The       hemorrhoids were Grade I (internal hemorrhoids that do not prolapse).      The terminal ileum appeared normal. Biopsies were taken with a cold       forceps for histology.      Normal mucosa was found in the entire colon. Biopsies for histology were       taken with a cold forceps from the right colon, left colon and rectum       for evaluation of microscopic colitis.      The exam was otherwise without abnormality on direct and retroflexion       views. Impression:            - Non-bleeding internal hemorrhoids.                        - The examined portion of the ileum was normal.                         Biopsied.                        - Normal mucosa in the entire examined colon. Biopsied.                        - The examination was otherwise normal on direct and                         retroflexion views. Recommendation:        - Patient has a contact number available for                         emergencies. The signs and symptoms of potential                         delayed complications were discussed with the patient.                         Return to normal activities tomorrow. Written  discharge instructions were provided to the patient.                        - High fiber diet.                        - Continue present medications.                        - Await pathology results.                        - Return to GI office PRN.                        - The findings and recommendations were discussed with                          the designated responsible adult.                        - The findings and recommendations were discussed with                         the patient. Procedure Code(s):     --- Professional ---                        [phone], Colonoscopy, flexible; with biopsy, single or                         multiple Diagnosis Code(s):     --- Professional ---                        K92.1, Melena (includes Hematochezia)                        K64.0, First degree hemorrhoids CPT copyright 2019 American Medical Association. All rights reserved. The codes documented in this report are preliminary and upon coder review may  be revised to meet current compliance requirements. Stanton Kidney MD, MD 05/29/2019 11:31:27 AM This report has been signed electronically. Number of Addenda: 0 Note Initiated On: 05/29/2019 11:09 AM Scope Withdrawal Time: 0 hours 7 minutes 46 seconds  Total Procedure Duration: 0 hours 12 minutes 49 seconds  Estimated Blood Loss:  Estimated blood loss: none.      [facility]

## 2019-05-30 ENCOUNTER — Encounter: Payer: Self-pay | Admitting: Internal Medicine

## 2019-05-30 LAB — SURGICAL PATHOLOGY

## 2019-06-23 ENCOUNTER — Other Ambulatory Visit: Payer: Self-pay

## 2019-06-23 ENCOUNTER — Emergency Department
Admission: EM | Admit: 2019-06-23 | Discharge: 2019-06-23 | Disposition: A | Discharge status: Home / Self Care | Payer: Medicaid Other | Attending: Emergency Medicine | Admitting: Emergency Medicine

## 2019-06-23 ENCOUNTER — Encounter: Payer: Self-pay | Admitting: Emergency Medicine

## 2019-06-23 DIAGNOSIS — J029 Acute pharyngitis, unspecified: Secondary | ICD-10-CM | POA: Diagnosis not present

## 2019-06-23 DIAGNOSIS — F17210 Nicotine dependence, cigarettes, uncomplicated: Secondary | ICD-10-CM | POA: Insufficient documentation

## 2019-06-23 DIAGNOSIS — J45909 Unspecified asthma, uncomplicated: Secondary | ICD-10-CM | POA: Insufficient documentation

## 2019-06-23 DIAGNOSIS — R112 Nausea with vomiting, unspecified: Secondary | ICD-10-CM | POA: Insufficient documentation

## 2019-06-23 DIAGNOSIS — Z20822 Contact with and (suspected) exposure to covid-19: Secondary | ICD-10-CM

## 2019-06-23 DIAGNOSIS — M791 Myalgia, unspecified site: Secondary | ICD-10-CM | POA: Insufficient documentation

## 2019-06-23 DIAGNOSIS — Z20828 Contact with and (suspected) exposure to other viral communicable diseases: Secondary | ICD-10-CM | POA: Insufficient documentation

## 2019-06-23 DIAGNOSIS — R05 Cough: Secondary | ICD-10-CM | POA: Diagnosis not present

## 2019-06-23 LAB — INFLUENZA PANEL BY PCR (TYPE A & B)
Influenza A By PCR: NEGATIVE
Influenza B By PCR: NEGATIVE

## 2019-06-23 MED ORDER — ONDANSETRON 4 MG PO TBDP: 4.0000 mg | ORAL_TABLET | Freq: Three times a day (TID) | ORAL | 0 refills | Status: DC | PRN

## 2019-06-23 MED ORDER — PSEUDOEPH-BROMPHEN-DM 30-2-10 MG/5ML PO SYRP: 10.0000 mL | ORAL_SOLUTION | Freq: Four times a day (QID) | ORAL | 0 refills | Status: DC | PRN

## 2019-06-23 NOTE — ED Triage Notes (Signed)
Pt via POV from complaining of flu-like symptoms that started yesterday. Pt c/o productive cough, generalized body aches, stomach ache, and headache. Pt would like to be tested for the flu and COVID. Pt states she has been in contact with someone who had COVID.   Pt NAD and A&Ox4 at this time.
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
## 2019-06-23 NOTE — ED Notes (Signed)
This Rn attempted to call legal guardian, no answer. Called caregiver, Randal Buba, who knew that pt is in ED and gave consent for testing and treatment.

## 2019-06-25 ENCOUNTER — Telehealth: Payer: Self-pay

## 2019-06-25 LAB — NOVEL CORONAVIRUS, NAA (HOSP ORDER, SEND-OUT TO REF LAB; TAT 18-24 HRS): SARS-CoV-2, NAA: NOT DETECTED

## 2019-06-25 NOTE — Telephone Encounter (Signed)
Caller given negative result and verbalized understanding  

## 2019-07-23 ENCOUNTER — Encounter: Payer: Self-pay | Admitting: Emergency Medicine

## 2019-07-23 ENCOUNTER — Emergency Department
Admission: EM | Admit: 2019-07-23 | Discharge: 2019-07-23 | Disposition: A | Discharge status: Home / Self Care | Payer: Medicaid Other | Attending: Emergency Medicine | Admitting: Emergency Medicine

## 2019-07-23 DIAGNOSIS — J45909 Unspecified asthma, uncomplicated: Secondary | ICD-10-CM | POA: Diagnosis not present

## 2019-07-23 DIAGNOSIS — F4310 Post-traumatic stress disorder, unspecified: Secondary | ICD-10-CM | POA: Insufficient documentation

## 2019-07-23 DIAGNOSIS — F419 Anxiety disorder, unspecified: Secondary | ICD-10-CM | POA: Diagnosis not present

## 2019-07-23 DIAGNOSIS — B20 Human immunodeficiency virus [HIV] disease: Secondary | ICD-10-CM | POA: Diagnosis not present

## 2019-07-23 DIAGNOSIS — F17210 Nicotine dependence, cigarettes, uncomplicated: Secondary | ICD-10-CM | POA: Insufficient documentation

## 2019-07-23 DIAGNOSIS — F09 Unspecified mental disorder due to known physiological condition: Secondary | ICD-10-CM | POA: Diagnosis present

## 2019-07-23 DIAGNOSIS — F909 Attention-deficit hyperactivity disorder, unspecified type: Secondary | ICD-10-CM | POA: Insufficient documentation

## 2019-07-23 DIAGNOSIS — Z79899 Other long term (current) drug therapy: Secondary | ICD-10-CM | POA: Diagnosis not present

## 2019-07-23 DIAGNOSIS — Z91010 Allergy to peanuts: Secondary | ICD-10-CM | POA: Insufficient documentation

## 2019-07-23 LAB — URINE DRUG SCREEN, QUALITATIVE (ARMC ONLY)
Amphetamines, Ur Screen: NOT DETECTED
Barbiturates, Ur Screen: NOT DETECTED
Benzodiazepine, Ur Scrn: NOT DETECTED
Cannabinoid 50 Ng, Ur ~~LOC~~: NOT DETECTED
Cocaine Metabolite,Ur ~~LOC~~: NOT DETECTED
MDMA (Ecstasy)Ur Screen: NOT DETECTED
Methadone Scn, Ur: NOT DETECTED
Opiate, Ur Screen: NOT DETECTED
Phencyclidine (PCP) Ur S: NOT DETECTED
Tricyclic, Ur Screen: NOT DETECTED

## 2019-07-23 LAB — COMPREHENSIVE METABOLIC PANEL
ALT: 29 U/L (ref 0–44)
AST: 24 U/L (ref 15–41)
Albumin: 3.9 g/dL (ref 3.5–5.0)
Alkaline Phosphatase: 71 U/L (ref 38–126)
Anion gap: 8 (ref 5–15)
BUN: 19 mg/dL (ref 6–20)
CO2: 19 mmol/L — ABNORMAL LOW (ref 22–32)
Calcium: 8.7 mg/dL — ABNORMAL LOW (ref 8.9–10.3)
Chloride: 112 mmol/L — ABNORMAL HIGH (ref 98–111)
Creatinine, Ser: 0.75 mg/dL (ref 0.44–1.00)
GFR calc Af Amer: >60 mL/min (ref >60)
GFR calc non Af Amer: >60 mL/min (ref >60)
Glucose, Bld: 131 mg/dL — ABNORMAL HIGH (ref 70–99)
Potassium: 3.8 mmol/L (ref 3.5–5.1)
Sodium: 139 mmol/L (ref 135–145)
Total Bilirubin: 0.4 mg/dL (ref 0.3–1.2)
Total Protein: 7.6 g/dL (ref 6.5–8.1)

## 2019-07-23 LAB — CBC
HCT: 39.1 % (ref 36.0–46.0)
Hemoglobin: 13.6 g/dL (ref 12.0–15.0)
MCH: 31.3 pg (ref 26.0–34.0)
MCHC: 34.8 g/dL (ref 30.0–36.0)
MCV: 89.9 fL (ref 80.0–100.0)
Platelets: 245 10*3/uL (ref 150–400)
RBC: 4.35 MIL/uL (ref 3.87–5.11)
RDW: 12.3 % (ref 11.5–15.5)
WBC: 8.7 10*3/uL (ref 4.0–10.5)
nRBC: 0 % (ref 0.0–0.2)

## 2019-07-23 LAB — SALICYLATE LEVEL: Salicylate Lvl: <7 mg/dL — ABNORMAL LOW (ref 7.0–30.0)

## 2019-07-23 LAB — ACETAMINOPHEN LEVEL: Acetaminophen (Tylenol), Serum: <10 ug/mL — ABNORMAL LOW (ref 10–30)

## 2019-07-23 LAB — ETHANOL: Alcohol, Ethyl (B): <10 mg/dL (ref <10)

## 2019-07-23 LAB — POCT PREGNANCY, URINE: Preg Test, Ur: NEGATIVE

## 2019-07-23 MED ORDER — FLUOXETINE HCL 20 MG PO CAPS: 20.0000 mg | ORAL_CAPSULE | Freq: Every day | ORAL | 0 refills | Status: DC

## 2019-07-23 MED ORDER — PRAZOSIN HCL 1 MG PO CAPS: 1.0000 mg | ORAL_CAPSULE | Freq: Every day | ORAL | 0 refills | Status: DC

## 2019-07-23 NOTE — ED Notes (Signed)
Called number for legal guardian Purvis Kilts [phone]) left message on voicemail. Called number on voicemail [phone] for 24 hr emergency line to notify patient was seen here in ED and was to be discharge. Left message with Magdalene River.  This writer has called [facility] to notify patient is being discharge and needs ride home. Unable to make contact at this time will try again.

## 2019-07-23 NOTE — ED Notes (Signed)
Called caregiver again.  She says she is coming now from [place].
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
## 2019-07-23 NOTE — ED Notes (Signed)
Report given to [facility]. Patient moved to interview room and SOC in progress.

## 2019-07-23 NOTE — Discharge Instructions (Addendum)
1.  The psychiatrist tonight recommends the following medicines for the next 2 weeks: Prozac 20 mg daily Prazosin 1 mg nightly 2.  Follow-up with your ACT team today. 3.  Return to the ER for worsening symptoms, feelings of hurting yourself or others, or other concerns.

## 2019-07-23 NOTE — ED Notes (Signed)
Crystal Black guardian called and gave me number for home--[phone].  Called and they will come pick her up around 10-1030am.

## 2019-07-23 NOTE — ED Notes (Signed)

## 2019-07-23 NOTE — ED Notes (Signed)
Called guardian number.  Unable to leave message as voicemail is full.  I called the emergency /off hour number [phone] and spoke to [place].  She says she will contact guardian and group home and they are aware she is being discharged.
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
## 2019-07-23 NOTE — ED Notes (Signed)
Pt belongings:  1 black pant 1 white shirt 1 black jacket 1 pair black/red shoes 1 brown brief 1 phone charger 1 phone 1 cigarette lighter 1 pack cigarettes

## 2019-09-13 ENCOUNTER — Encounter: Payer: Self-pay | Admitting: Emergency Medicine

## 2019-09-13 ENCOUNTER — Other Ambulatory Visit: Payer: Self-pay

## 2019-09-13 DIAGNOSIS — K2900 Acute gastritis without bleeding: Secondary | ICD-10-CM | POA: Diagnosis not present

## 2019-09-13 DIAGNOSIS — Z79899 Other long term (current) drug therapy: Secondary | ICD-10-CM | POA: Insufficient documentation

## 2019-09-13 DIAGNOSIS — J45909 Unspecified asthma, uncomplicated: Secondary | ICD-10-CM | POA: Diagnosis not present

## 2019-09-13 DIAGNOSIS — R1011 Right upper quadrant pain: Secondary | ICD-10-CM | POA: Diagnosis present

## 2019-09-13 DIAGNOSIS — Z21 Asymptomatic human immunodeficiency virus [HIV] infection status: Secondary | ICD-10-CM | POA: Diagnosis not present

## 2019-09-13 DIAGNOSIS — F909 Attention-deficit hyperactivity disorder, unspecified type: Secondary | ICD-10-CM | POA: Insufficient documentation

## 2019-09-13 DIAGNOSIS — Z91010 Allergy to peanuts: Secondary | ICD-10-CM | POA: Insufficient documentation

## 2019-09-13 DIAGNOSIS — Z793 Long term (current) use of hormonal contraceptives: Secondary | ICD-10-CM | POA: Diagnosis not present

## 2019-09-13 DIAGNOSIS — F17210 Nicotine dependence, cigarettes, uncomplicated: Secondary | ICD-10-CM | POA: Diagnosis not present

## 2019-09-13 LAB — COMPREHENSIVE METABOLIC PANEL
ALT: 25 U/L (ref 0–44)
AST: 21 U/L (ref 15–41)
Albumin: 4.1 g/dL (ref 3.5–5.0)
Alkaline Phosphatase: 64 U/L (ref 38–126)
Anion gap: 9 (ref 5–15)
BUN: 22 mg/dL — ABNORMAL HIGH (ref 6–20)
CO2: 17 mmol/L — ABNORMAL LOW (ref 22–32)
Calcium: 8.8 mg/dL — ABNORMAL LOW (ref 8.9–10.3)
Chloride: 114 mmol/L — ABNORMAL HIGH (ref 98–111)
Creatinine, Ser: 0.98 mg/dL (ref 0.44–1.00)
GFR calc Af Amer: >60 mL/min (ref >60)
GFR calc non Af Amer: >60 mL/min (ref >60)
Glucose, Bld: 99 mg/dL (ref 70–99)
Potassium: 3.9 mmol/L (ref 3.5–5.1)
Sodium: 140 mmol/L (ref 135–145)
Total Bilirubin: 0.5 mg/dL (ref 0.3–1.2)
Total Protein: 7.6 g/dL (ref 6.5–8.1)

## 2019-09-13 LAB — CBC
HCT: 41.2 % (ref 36.0–46.0)
Hemoglobin: 13.9 g/dL (ref 12.0–15.0)
MCH: 31.1 pg (ref 26.0–34.0)
MCHC: 33.7 g/dL (ref 30.0–36.0)
MCV: 92.2 fL (ref 80.0–100.0)
Platelets: 255 10*3/uL (ref 150–400)
RBC: 4.47 MIL/uL (ref 3.87–5.11)
RDW: 13.1 % (ref 11.5–15.5)
WBC: 6.3 10*3/uL (ref 4.0–10.5)
nRBC: 0 % (ref 0.0–0.2)

## 2019-09-13 LAB — LIPASE, BLOOD: Lipase: 29 U/L (ref 11–51)

## 2019-09-13 NOTE — ED Notes (Signed)
Messaged left for patient's legal guardian Purvis Kilts [phone].

## 2019-09-13 NOTE — ED Triage Notes (Addendum)
Patient with complaint of upper abdominal pain that is a stabbing pain that started three days ago. Patient states that yesterday she developed a headache, dizziness and nausea. Patient states that three days ago she smoked 40 cigarettes and has been short of breath since.

## 2019-09-14 ENCOUNTER — Emergency Department: Payer: Medicaid Other

## 2019-09-14 ENCOUNTER — Emergency Department
Admission: EM | Admit: 2019-09-14 | Discharge: 2019-09-14 | Disposition: A | Discharge status: Home / Self Care | Payer: Medicaid Other | Attending: Emergency Medicine | Admitting: Emergency Medicine

## 2019-09-14 DIAGNOSIS — K2900 Acute gastritis without bleeding: Secondary | ICD-10-CM

## 2019-09-14 DIAGNOSIS — F17200 Nicotine dependence, unspecified, uncomplicated: Secondary | ICD-10-CM

## 2019-09-14 DIAGNOSIS — R1011 Right upper quadrant pain: Secondary | ICD-10-CM

## 2019-09-14 LAB — URINALYSIS, COMPLETE (UACMP) WITH MICROSCOPIC
Bilirubin Urine: NEGATIVE
Glucose, UA: NEGATIVE mg/dL
Hgb urine dipstick: NEGATIVE
Ketones, ur: 5 mg/dL — AB
Leukocytes,Ua: NEGATIVE
Nitrite: NEGATIVE
Protein, ur: 30 mg/dL — AB
Specific Gravity, Urine: 1.029 (ref 1.005–1.030)
pH: 5 (ref 5.0–8.0)

## 2019-09-14 LAB — POC URINE PREG, ED: Preg Test, Ur: NEGATIVE

## 2019-09-14 LAB — TROPONIN I (HIGH SENSITIVITY): Troponin I (High Sensitivity): <2 ng/L (ref <18)

## 2019-09-14 LAB — GROUP A STREP BY PCR: Group A Strep by PCR: NOT DETECTED

## 2019-09-14 LAB — FIBRIN DERIVATIVES D-DIMER (ARMC ONLY): Fibrin derivatives D-dimer (ARMC): 209.8 ng/mL (FEU) (ref 0.00–499.00)

## 2019-09-14 MED ORDER — KETOROLAC TROMETHAMINE 30 MG/ML IJ SOLN
15.0000 mg | Freq: Once | INTRAMUSCULAR | Status: AC
  Administered 2019-09-14: 15 mg via INTRAVENOUS
  Filled 2019-09-14: qty 1

## 2019-09-14 MED ORDER — ACETAMINOPHEN 500 MG PO TABS
1000.0000 mg | ORAL_TABLET | Freq: Once | ORAL | Status: AC
  Administered 2019-09-14: 03:00:00 1000 mg via ORAL
  Filled 2019-09-14: qty 2

## 2019-09-14 MED ORDER — SUCRALFATE 1 G PO TABS
1.0000 g | ORAL_TABLET | Freq: Once | ORAL | Status: AC
  Administered 2019-09-14: 1 g via ORAL
  Filled 2019-09-14: qty 1

## 2019-09-14 MED ORDER — PANTOPRAZOLE SODIUM 40 MG PO TBEC
40.0000 mg | DELAYED_RELEASE_TABLET | Freq: Once | ORAL | Status: AC
  Administered 2019-09-14: 40 mg via ORAL
  Filled 2019-09-14: qty 1

## 2019-09-14 MED ORDER — SODIUM CHLORIDE 0.9 % IV BOLUS
1000.0000 mL | Freq: Once | INTRAVENOUS | Status: AC
  Administered 2019-09-14: 1000 mL via INTRAVENOUS

## 2019-09-14 MED ORDER — ONDANSETRON 4 MG PO TBDP: 4.0000 mg | ORAL_TABLET | Freq: Three times a day (TID) | ORAL | 0 refills | Status: DC | PRN

## 2019-09-14 MED ORDER — LIDOCAINE VISCOUS HCL 2 % MT SOLN
15.0000 mL | Freq: Once | OROMUCOSAL | Status: AC
  Administered 2019-09-14: 15 mL via ORAL
  Filled 2019-09-14: qty 15

## 2019-09-14 MED ORDER — ONDANSETRON HCL 4 MG/2ML IJ SOLN
4.0000 mg | Freq: Once | INTRAMUSCULAR | Status: AC
  Administered 2019-09-14: 4 mg via INTRAVENOUS
  Filled 2019-09-14: qty 2

## 2019-09-14 MED ORDER — SUCRALFATE 1 G PO TABS: 1.0000 g | ORAL_TABLET | Freq: Three times a day (TID) | ORAL | 0 refills | Status: DC

## 2019-09-14 MED ORDER — ALUM & MAG HYDROXIDE-SIMETH 200-200-20 MG/5ML PO SUSP
30.0000 mL | Freq: Once | ORAL | Status: AC
  Administered 2019-09-14: 03:00:00 30 mL via ORAL
  Filled 2019-09-14: qty 30

## 2019-09-14 MED ORDER — PANTOPRAZOLE SODIUM 20 MG PO TBEC: 20.0000 mg | DELAYED_RELEASE_TABLET | Freq: Every day | ORAL | 0 refills | Status: DC

## 2019-09-14 NOTE — Discharge Instructions (Addendum)
This is most likely secondary to inflammation of your stomach given the pain is in your upper middle abdomen.  You can take a Protonix to help decrease the acid, Carafate to help line the stomach and Zofran to help with nausea.  You should stop smoking because this can cause worsening gastritis.  Return to the ER if you develop lower abdominal pain, fevers, worsening pain or any other concerns  MPRESSION: 1. The gallbladder and common bile duct are normal. 2. Heterogeneous increased echo texture in the liver is nonspecific but often seen with hepatic steatosis.

## 2019-09-14 NOTE — ED Notes (Addendum)
shanda arrived to pick pt up. She is from [facility] and ok with kara for pt to leave with her.  DC printed and signed by shanda, placed in med rec box.

## 2019-09-14 NOTE — ED Notes (Signed)
Pt and night shift RN have called Crystal Black without answer, ride of patient to adult foster facility.  Crystal Black is Crystal Black (owner of adult foster facility) daughter and brought pt to ED and will be bringing pt home.  Will try and call again shortly.

## 2019-09-14 NOTE — ED Notes (Signed)
Pt offered cereal, declined.  Pt given juice.  NAD.
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
## 2019-09-14 NOTE — ED Notes (Signed)
This RN able to get in touch with Dianna, legal guardian in regards of discharge.

## 2019-09-14 NOTE — ED Notes (Signed)
Attempted to call shonda and latoya without success.  Pt remains in room until able to reach safe ride.

## 2019-09-14 NOTE — ED Notes (Addendum)
Called and spoke with Diannia Ruder, legal guardian services on call person.  [phone].  Attempting to reach someone to pick up pt. Per legal guardian service [facility] resides at [facility]. Chanetta Marshall will attempt to reach dianna her regular caseworker and see if she know is pt has been moved.

## 2019-09-14 NOTE — ED Notes (Signed)
Attempted to call [facility] and verify if pt still living there. Group home staff reports pt is staying at an [facility] with latoya and shanda as her care givers.

## 2019-09-14 NOTE — ED Notes (Signed)
Diannia Ruder called and reports Crystal Black normal guardian did get in touch with her and inform her that pt is with latoya at the [facility].  Still unable to reach anyone.  She informed this RN Crystal Black will try and reach latoya.

## 2019-09-14 NOTE — ED Notes (Signed)
Explained delay to pt. Pt resting, lights dimmed for comfort.  Offered crackers.

## 2019-09-14 NOTE — ED Notes (Signed)
Remain to attempt to contact shanda or latoya to get patient home. Unable to reach anyone.

## 2020-04-14 ENCOUNTER — Emergency Department
Admission: EM | Admit: 2020-04-14 | Discharge: 2020-04-14 | Disposition: A | Discharge status: Home / Self Care | Payer: Medicaid Other | Attending: Emergency Medicine | Admitting: Emergency Medicine

## 2020-04-14 ENCOUNTER — Other Ambulatory Visit: Payer: Self-pay

## 2020-04-14 DIAGNOSIS — R531 Weakness: Secondary | ICD-10-CM | POA: Diagnosis present

## 2020-04-14 DIAGNOSIS — J45909 Unspecified asthma, uncomplicated: Secondary | ICD-10-CM | POA: Insufficient documentation

## 2020-04-14 DIAGNOSIS — F17210 Nicotine dependence, cigarettes, uncomplicated: Secondary | ICD-10-CM | POA: Insufficient documentation

## 2020-04-14 DIAGNOSIS — Z91010 Allergy to peanuts: Secondary | ICD-10-CM | POA: Diagnosis not present

## 2020-04-14 DIAGNOSIS — N12 Tubulo-interstitial nephritis, not specified as acute or chronic: Secondary | ICD-10-CM | POA: Diagnosis not present

## 2020-04-14 DIAGNOSIS — R5381 Other malaise: Secondary | ICD-10-CM | POA: Diagnosis not present

## 2020-04-14 LAB — CBC
HCT: 35.6 % — ABNORMAL LOW (ref 36.0–46.0)
Hemoglobin: 12.1 g/dL (ref 12.0–15.0)
MCH: 32 pg (ref 26.0–34.0)
MCHC: 34 g/dL (ref 30.0–36.0)
MCV: 94.2 fL (ref 80.0–100.0)
Platelets: 217 10*3/uL (ref 150–400)
RBC: 3.78 MIL/uL — ABNORMAL LOW (ref 3.87–5.11)
RDW: 13.3 % (ref 11.5–15.5)
WBC: 5.6 10*3/uL (ref 4.0–10.5)
nRBC: 0 % (ref 0.0–0.2)

## 2020-04-14 LAB — URINALYSIS, COMPLETE (UACMP) WITH MICROSCOPIC
Bilirubin Urine: NEGATIVE
Glucose, UA: NEGATIVE mg/dL
Ketones, ur: NEGATIVE mg/dL
Nitrite: POSITIVE — AB
Protein, ur: 30 mg/dL — AB
Specific Gravity, Urine: 1.017 (ref 1.005–1.030)
WBC, UA: >50 WBC/hpf — ABNORMAL HIGH (ref 0–5)
pH: 5 (ref 5.0–8.0)

## 2020-04-14 LAB — BASIC METABOLIC PANEL
Anion gap: 9 (ref 5–15)
BUN: 16 mg/dL (ref 6–20)
CO2: 20 mmol/L — ABNORMAL LOW (ref 22–32)
Calcium: 9 mg/dL (ref 8.9–10.3)
Chloride: 112 mmol/L — ABNORMAL HIGH (ref 98–111)
Creatinine, Ser: 1.02 mg/dL — ABNORMAL HIGH (ref 0.44–1.00)
GFR, Estimated: >60 mL/min (ref >60)
Glucose, Bld: 94 mg/dL (ref 70–99)
Potassium: 3.8 mmol/L (ref 3.5–5.1)
Sodium: 141 mmol/L (ref 135–145)

## 2020-04-14 LAB — PREGNANCY, URINE: Preg Test, Ur: NEGATIVE

## 2020-04-14 MED ORDER — CEFTRIAXONE SODIUM 1 G IJ SOLR
1.0000 g | Freq: Once | INTRAMUSCULAR | Status: AC
  Administered 2020-04-14: 1 g via INTRAMUSCULAR
  Filled 2020-04-14: qty 10

## 2020-04-14 MED ORDER — CIPROFLOXACIN HCL 500 MG PO TABS
500.0000 mg | ORAL_TABLET | Freq: Once | ORAL | Status: AC
  Administered 2020-04-14: 500 mg via ORAL
  Filled 2020-04-14: qty 1

## 2020-04-14 MED ORDER — CIPROFLOXACIN HCL 500 MG PO TABS: 500.0000 mg | ORAL_TABLET | Freq: Two times a day (BID) | ORAL | 0 refills | Status: AC

## 2020-04-14 NOTE — ED Triage Notes (Addendum)
Pt states she has been dizziness, off balance with N/V for the past 2-3 days, and has fallen multiple times and is c/o lower back and neck pain. Pt states she was brought here from the group home she lives at , attendee is in the parking lot.
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
## 2020-04-14 NOTE — ED Notes (Signed)
Pt ambulated to lobby. Group home staff to come and pick up pt.

## 2020-04-15 LAB — POCT PREGNANCY, URINE: Preg Test, Ur: NEGATIVE

## 2020-04-16 ENCOUNTER — Other Ambulatory Visit: Payer: Self-pay

## 2020-04-16 ENCOUNTER — Encounter: Payer: Self-pay | Admitting: Emergency Medicine

## 2020-04-16 ENCOUNTER — Emergency Department
Admission: EM | Admit: 2020-04-16 | Discharge: 2020-04-16 | Payer: Medicaid Other | Attending: Emergency Medicine | Admitting: Emergency Medicine

## 2020-04-16 DIAGNOSIS — F17210 Nicotine dependence, cigarettes, uncomplicated: Secondary | ICD-10-CM | POA: Insufficient documentation

## 2020-04-16 DIAGNOSIS — J45909 Unspecified asthma, uncomplicated: Secondary | ICD-10-CM | POA: Diagnosis not present

## 2020-04-16 DIAGNOSIS — S01502A Unspecified open wound of oral cavity, initial encounter: Secondary | ICD-10-CM | POA: Insufficient documentation

## 2020-04-16 DIAGNOSIS — Z91010 Allergy to peanuts: Secondary | ICD-10-CM | POA: Diagnosis not present

## 2020-04-16 DIAGNOSIS — W010XXA Fall on same level from slipping, tripping and stumbling without subsequent striking against object, initial encounter: Secondary | ICD-10-CM | POA: Diagnosis not present

## 2020-04-16 DIAGNOSIS — Y9389 Activity, other specified: Secondary | ICD-10-CM | POA: Insufficient documentation

## 2020-04-16 DIAGNOSIS — S01511A Laceration without foreign body of lip, initial encounter: Secondary | ICD-10-CM | POA: Insufficient documentation

## 2020-04-16 DIAGNOSIS — S00501A Unspecified superficial injury of lip, initial encounter: Secondary | ICD-10-CM | POA: Diagnosis present

## 2020-04-16 MED ORDER — CHLORHEXIDINE GLUCONATE 0.12 % MT SOLN: 15.0000 mL | Freq: Two times a day (BID) | OROMUCOSAL | 0 refills | Status: DC

## 2020-04-16 NOTE — Discharge Instructions (Signed)
Use the mouth rinse 2 times per day to help the lip and wound inside your mouth heal.  Follow up with your primary care as needed.

## 2020-04-16 NOTE — ED Triage Notes (Signed)
Pt in via POV from unspecified Group Home; reports mechanical fall today, states she slipped on a wet floor, with minor laceration to bottom lip; some bruising swelling noted to lip.  No bleeding noted at this time.
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
## 2020-04-16 NOTE — ED Notes (Signed)
Per EDP, no urinalysis needed at this time. Care home updated.

## 2020-04-16 NOTE — ED Notes (Signed)
Care giver in room to sign for pt, pt left with care giver.

## 2020-04-16 NOTE — ED Notes (Addendum)
Updated legal guardian, Krista Blue, left message. Gave update to [facility] Care home to send transportation for pt. Caregiver given dc instructions over phone.

## 2020-04-16 NOTE — ED Notes (Signed)
Per Legal Guardian, Crystal Black, group home is requesting Urinalysis.

## 2020-04-17 LAB — URINE CULTURE: Culture: >=100000 — AB

## 2020-05-19 ENCOUNTER — Encounter: Payer: Self-pay | Admitting: Infectious Diseases

## 2020-05-19 ENCOUNTER — Ambulatory Visit: Payer: Medicaid Other | Attending: Infectious Diseases | Admitting: Infectious Diseases

## 2020-05-19 ENCOUNTER — Other Ambulatory Visit
Admission: RE | Admit: 2020-05-19 | Discharge: 2020-05-19 | Disposition: A | Discharge status: Home / Self Care | Payer: Medicaid Other | Source: Ambulatory Visit | Attending: Infectious Diseases | Admitting: Infectious Diseases

## 2020-05-19 VITALS — BP 93/73 | HR 77 | Resp 16 | Ht 62.0 in | Wt 173.0 lb

## 2020-05-19 DIAGNOSIS — F259 Schizoaffective disorder, unspecified: Secondary | ICD-10-CM | POA: Insufficient documentation

## 2020-05-19 DIAGNOSIS — Z79899 Other long term (current) drug therapy: Secondary | ICD-10-CM | POA: Diagnosis not present

## 2020-05-19 DIAGNOSIS — F603 Borderline personality disorder: Secondary | ICD-10-CM | POA: Insufficient documentation

## 2020-05-19 DIAGNOSIS — K219 Gastro-esophageal reflux disease without esophagitis: Secondary | ICD-10-CM | POA: Insufficient documentation

## 2020-05-19 DIAGNOSIS — F17210 Nicotine dependence, cigarettes, uncomplicated: Secondary | ICD-10-CM | POA: Insufficient documentation

## 2020-05-19 DIAGNOSIS — F4310 Post-traumatic stress disorder, unspecified: Secondary | ICD-10-CM | POA: Insufficient documentation

## 2020-05-19 DIAGNOSIS — I10 Essential (primary) hypertension: Secondary | ICD-10-CM | POA: Diagnosis not present

## 2020-05-19 DIAGNOSIS — T7451XA Adult forced sexual exploitation, confirmed, initial encounter: Secondary | ICD-10-CM | POA: Insufficient documentation

## 2020-05-19 DIAGNOSIS — B20 Human immunodeficiency virus [HIV] disease: Secondary | ICD-10-CM | POA: Insufficient documentation

## 2020-05-19 DIAGNOSIS — Z23 Encounter for immunization: Secondary | ICD-10-CM | POA: Diagnosis not present

## 2020-05-19 LAB — COMPREHENSIVE METABOLIC PANEL
ALT: 17 U/L (ref 0–44)
AST: 15 U/L (ref 15–41)
Albumin: 4 g/dL (ref 3.5–5.0)
Alkaline Phosphatase: 54 U/L (ref 38–126)
Anion gap: 9 (ref 5–15)
BUN: 19 mg/dL (ref 6–20)
CO2: 21 mmol/L — ABNORMAL LOW (ref 22–32)
Calcium: 8.5 mg/dL — ABNORMAL LOW (ref 8.9–10.3)
Chloride: 107 mmol/L (ref 98–111)
Creatinine, Ser: 0.95 mg/dL (ref 0.44–1.00)
GFR, Estimated: >60 mL/min (ref >60)
Glucose, Bld: 77 mg/dL (ref 70–99)
Potassium: 3.5 mmol/L (ref 3.5–5.1)
Sodium: 137 mmol/L (ref 135–145)
Total Bilirubin: 0.6 mg/dL (ref 0.3–1.2)
Total Protein: 7.7 g/dL (ref 6.5–8.1)

## 2020-05-19 LAB — CBC WITH DIFFERENTIAL/PLATELET
Abs Immature Granulocytes: 0.01 10*3/uL (ref 0.00–0.07)
Basophils Absolute: 0 10*3/uL (ref 0.0–0.1)
Basophils Relative: 1 %
Eosinophils Absolute: 0 10*3/uL (ref 0.0–0.5)
Eosinophils Relative: 1 %
HCT: 32.7 % — ABNORMAL LOW (ref 36.0–46.0)
Hemoglobin: 11.3 g/dL — ABNORMAL LOW (ref 12.0–15.0)
Immature Granulocytes: 0 %
Lymphocytes Relative: 48 %
Lymphs Abs: 2.8 10*3/uL (ref 0.7–4.0)
MCH: 32.6 pg (ref 26.0–34.0)
MCHC: 34.6 g/dL (ref 30.0–36.0)
MCV: 94.2 fL (ref 80.0–100.0)
Monocytes Absolute: 0.5 10*3/uL (ref 0.1–1.0)
Monocytes Relative: 8 %
Neutro Abs: 2.5 10*3/uL (ref 1.7–7.7)
Neutrophils Relative %: 42 %
Platelets: 171 10*3/uL (ref 150–400)
RBC: 3.47 MIL/uL — ABNORMAL LOW (ref 3.87–5.11)
RDW: 12.8 % (ref 11.5–15.5)
WBC: 5.8 10*3/uL (ref 4.0–10.5)
nRBC: 0.3 % — ABNORMAL HIGH (ref 0.0–0.2)

## 2020-05-19 LAB — LIPID PANEL
Cholesterol: 198 mg/dL (ref 0–200)
HDL: 24 mg/dL — ABNORMAL LOW (ref >40)
LDL Cholesterol: 156 mg/dL — ABNORMAL HIGH (ref 0–99)
Total CHOL/HDL Ratio: 8.3 RATIO
Triglycerides: 90 mg/dL (ref <150)
VLDL: 18 mg/dL (ref 0–40)

## 2020-05-19 LAB — HEPATITIS PANEL, ACUTE
HCV Ab: NONREACTIVE
Hep A IgM: NONREACTIVE
Hep B C IgM: NONREACTIVE
Hepatitis B Surface Ag: NONREACTIVE

## 2020-05-19 MED ORDER — DARUNAVIR-COBICISTAT 800-150 MG PO TABS: 1.0000 | ORAL_TABLET | Freq: Every day | ORAL | 6 refills | Status: DC

## 2020-05-19 MED ORDER — DOLUTEGRAVIR SODIUM 50 MG PO TABS
50.0000 mg | ORAL_TABLET | Freq: Every day | ORAL | 6 refills | Status: DC
Start: 2020-05-19 — End: 2020-12-16

## 2020-05-19 NOTE — Patient Instructions (Signed)
Today you are here for follow up for your HIV- you currently are on prezcobix and tivcay- your other medicine valproate can make the level of tivical low- we will do labs today. Depending on that we will have to adjust your medicines. Please see a neurologist regarding seizure med
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
## 2020-05-20 LAB — T-HELPER CELLS CD4/CD8 %
% CD 4 Pos. Lymph.: 40.5 % (ref 30.8–58.5)
Absolute CD 4 Helper: 1134 /uL (ref 359–1519)
Basophils Absolute: 0 10*3/uL (ref 0.0–0.2)
Basos: 1 %
CD3+CD4+ Cells/CD3+CD8+ Cells Bld: 1.23 (ref 0.92–3.72)
CD3+CD8+ Cells # Bld: 924 /uL — ABNORMAL HIGH (ref 109–897)
CD3+CD8+ Cells NFr Bld: 33 % (ref 12.0–35.5)
EOS (ABSOLUTE): 0 10*3/uL (ref 0.0–0.4)
Eos: 1 %
Hematocrit: 35.7 % (ref 34.0–46.6)
Hemoglobin: 11.8 g/dL (ref 11.1–15.9)
Immature Grans (Abs): 0 10*3/uL (ref 0.0–0.1)
Immature Granulocytes: 0 %
Lymphocytes Absolute: 2.8 10*3/uL (ref 0.7–3.1)
Lymphs: 47 %
MCH: 30.9 pg (ref 26.6–33.0)
MCHC: 33.1 g/dL (ref 31.5–35.7)
MCV: 94 fL (ref 79–97)
Monocytes Absolute: 0.4 10*3/uL (ref 0.1–0.9)
Monocytes: 7 %
Neutrophils Absolute: 2.6 10*3/uL (ref 1.4–7.0)
Neutrophils: 44 %
Platelets: 186 10*3/uL (ref 150–450)
RBC: 3.82 x10E6/uL (ref 3.77–5.28)
RDW: 13.3 % (ref 11.7–15.4)
WBC: 5.8 10*3/uL (ref 3.4–10.8)

## 2020-05-20 LAB — RPR: RPR Ser Ql: NONREACTIVE

## 2020-05-20 LAB — HIV-1 RNA QUANT-NO REFLEX-BLD
HIV 1 RNA Quant: <20 copies/mL
LOG10 HIV-1 RNA: UNDETERMINED log10copy/mL

## 2020-05-23 LAB — QUANTIFERON-TB GOLD PLUS: QuantiFERON-TB Gold Plus: NEGATIVE

## 2020-05-23 LAB — QUANTIFERON-TB GOLD PLUS (RQFGPL)
QuantiFERON Mitogen Value: >10 IU/mL
QuantiFERON Nil Value: 0.06 IU/mL
QuantiFERON TB1 Ag Value: 0.07 IU/mL
QuantiFERON TB2 Ag Value: 0.06 IU/mL

## 2020-06-04 ENCOUNTER — Other Ambulatory Visit: Payer: Self-pay

## 2020-06-04 ENCOUNTER — Ambulatory Visit: Payer: Medicaid Other | Attending: Infectious Diseases | Admitting: Infectious Diseases

## 2020-06-04 ENCOUNTER — Encounter: Payer: Self-pay | Admitting: Infectious Diseases

## 2020-06-04 DIAGNOSIS — B20 Human immunodeficiency virus [HIV] disease: Secondary | ICD-10-CM

## 2020-06-04 NOTE — Progress Notes (Signed)
The purpose of this virtual visit is to provide medical care while limiting exposure to the novel coronavirus (COVID19) for both patient and office staff.   Consent was obtained for phone visit:  Yes.   Answered questions that patient had about telehealth interaction:  Yes.   I discussed the limitations, risks, security and privacy concerns of performing an evaluation and management service by telephone. I also discussed with the patient that there may be a patient responsible charge related to this service. The patient expressed understanding and agreed to proceed.   Patient Location: Group Home Provider Location: office People on the call Crystal Black patient Tomma Rakers ( care taker)  Call was to discuss her recent labs Shared the results that Vl < 20, Cd4 was very high > 1000, liver, kidney normal As LDH high and HDL low need to have increased activity, decrease food rich in fat and quit smoking  Labs Lab Result  Date comment  HIV VL <20 05/19/20   CD4 1134 ( 40%) 05/19/20   Genotype     [id number]     HIV antibody     RPR NR 05/19/20   Quantiferon Gold NR 05/19/20   Hep C ab NR 05/19/20   Hepatitis B-ab,ag,c NR 05/19/20   Hepatitis A-IgM, IgG /T NR 05/19/20   Lipid(TC,HDL,LDL,TGL) 267,12,458, 90    GC/CHL     PAP     HB,PLT,Cr, LFT 11.3,171,0.95,15/17 05/19/20    Spent 12  min on the call

## 2020-07-20 ENCOUNTER — Other Ambulatory Visit: Payer: Self-pay

## 2020-07-20 ENCOUNTER — Ambulatory Visit (INDEPENDENT_AMBULATORY_CARE_PROVIDER_SITE_OTHER): Payer: Medicaid Other | Admitting: Obstetrics and Gynecology

## 2020-07-20 ENCOUNTER — Encounter: Payer: Self-pay | Admitting: Obstetrics and Gynecology

## 2020-07-20 VITALS — BP 96/60 | Ht 61.0 in | Wt 162.0 lb

## 2020-07-20 DIAGNOSIS — Z3046 Encounter for surveillance of implantable subdermal contraceptive: Secondary | ICD-10-CM

## 2020-07-20 DIAGNOSIS — Z113 Encounter for screening for infections with a predominantly sexual mode of transmission: Secondary | ICD-10-CM

## 2020-07-20 LAB — POCT URINE PREGNANCY: Preg Test, Ur: NEGATIVE

## 2020-07-20 MED ORDER — ETONOGESTREL 68 MG ~~LOC~~ IMPL: 1.0000 | DRUG_IMPLANT | Freq: Once | SUBCUTANEOUS | 0 refills | Status: DC

## 2020-07-20 NOTE — Patient Instructions (Signed)
I value your feedback and you entrusting us with your care. If you get a [facility] patient survey, I would appreciate you taking the time to let us know about your experience today. Thank you!  Remove the dressing in 24 hours,  keep the incision area dry for 24 hours and remove the Steristrip in 2-3  days.  Notify us if any signs of tenderness, redness, pain, or fevers develop.   
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
## 2020-07-22 LAB — CHLAMYDIA/GONOCOCCUS/TRICHOMONAS, NAA
Chlamydia by NAA: NEGATIVE
Gonococcus by NAA: NEGATIVE
Trich vag by NAA: NEGATIVE

## 2020-09-29 ENCOUNTER — Other Ambulatory Visit (HOSPITAL_COMMUNITY)
Admission: RE | Admit: 2020-09-29 | Discharge: 2020-09-29 | Disposition: A | Discharge status: Home / Self Care | Payer: Medicaid Other | Source: Ambulatory Visit | Attending: Obstetrics and Gynecology | Admitting: Obstetrics and Gynecology

## 2020-09-29 ENCOUNTER — Encounter: Payer: Self-pay | Admitting: Obstetrics and Gynecology

## 2020-09-29 ENCOUNTER — Other Ambulatory Visit: Payer: Self-pay

## 2020-09-29 ENCOUNTER — Ambulatory Visit (INDEPENDENT_AMBULATORY_CARE_PROVIDER_SITE_OTHER): Payer: Medicaid Other | Admitting: Obstetrics and Gynecology

## 2020-09-29 VITALS — BP 100/80 | Ht 61.0 in | Wt 166.0 lb

## 2020-09-29 DIAGNOSIS — Z113 Encounter for screening for infections with a predominantly sexual mode of transmission: Secondary | ICD-10-CM | POA: Insufficient documentation

## 2020-09-29 NOTE — Patient Instructions (Signed)
I value your feedback and you entrusting us with your care. If you get a [facility] patient survey, I would appreciate you taking the time to let us know about your experience today. Thank you! ? ? ?
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
## 2020-10-01 LAB — CERVICOVAGINAL ANCILLARY ONLY
Chlamydia: NEGATIVE
Comment: NEGATIVE
Comment: NEGATIVE
Comment: NORMAL
Neisseria Gonorrhea: NEGATIVE
Trichomonas: NEGATIVE

## 2020-10-01 NOTE — Progress Notes (Signed)
Caregiver [name] (on [place]) aware.

## 2020-10-01 NOTE — Progress Notes (Signed)
Pls let pt know STD testing neg. Thx.

## 2020-12-02 ENCOUNTER — Other Ambulatory Visit: Payer: Self-pay | Admitting: Podiatry

## 2020-12-02 ENCOUNTER — Ambulatory Visit (INDEPENDENT_AMBULATORY_CARE_PROVIDER_SITE_OTHER): Payer: Medicaid Other | Admitting: Podiatry

## 2020-12-02 ENCOUNTER — Ambulatory Visit (INDEPENDENT_AMBULATORY_CARE_PROVIDER_SITE_OTHER): Payer: Medicaid Other

## 2020-12-02 ENCOUNTER — Other Ambulatory Visit: Payer: Self-pay

## 2020-12-02 ENCOUNTER — Encounter: Payer: Self-pay | Admitting: Podiatry

## 2020-12-02 DIAGNOSIS — M2142 Flat foot [pes planus] (acquired), left foot: Secondary | ICD-10-CM

## 2020-12-02 DIAGNOSIS — G990 Autonomic neuropathy in diseases classified elsewhere: Secondary | ICD-10-CM

## 2020-12-02 DIAGNOSIS — M2141 Flat foot [pes planus] (acquired), right foot: Secondary | ICD-10-CM | POA: Diagnosis not present

## 2020-12-02 DIAGNOSIS — M109 Gout, unspecified: Secondary | ICD-10-CM

## 2020-12-02 DIAGNOSIS — M722 Plantar fascial fibromatosis: Secondary | ICD-10-CM

## 2020-12-02 DIAGNOSIS — M7989 Other specified soft tissue disorders: Secondary | ICD-10-CM

## 2020-12-02 NOTE — Progress Notes (Signed)
  Subjective:  Patient ID: Crystal Black, female    DOB: May 17, 1996,  MRN: [id number]  Chief Complaint  Patient presents with   Nail Problem   Foot Pain    Patient presents today for nail trim [facility] and bilat foot pain from top midfoot to bottom of heel x 3 weeks    25 y.o. female presents with the above complaint. History confirmed with patient.  She has diabetes with use of metformin.  Here with a Geophysicist/field seismologist.  Objective:  Physical Exam: warm, good capillary refill, no trophic changes or ulcerative lesions, normal DP and PT pulses, and normal sensory exam.  Elongated thickened nails.  Unable to reproduce pain in the foot.  Nonspecific.   Radiographs: X-ray of both feet: no fracture, dislocation, swelling or degenerative changes noted Assessment:   1. Pain and swelling of toe of left foot   2. Gout of left foot, unspecified cause, unspecified chronicity   3. Peripheral autonomic neuropathy due to underlying disease   4. Pes planus of both feet      Plan:  Patient was evaluated and treated and all questions answered.  Unclear what the source of her pain is.  Have ordered lab work to evaluate for gout.  Possible this is also neuropathy she has multiple risk factors including diabetes and HIV.    Return in about 1 month (around 01/01/2021) for after lab work to review.

## 2020-12-03 LAB — ARTHRITIS PANEL
Basophils Absolute: 0.1 10*3/uL (ref 0.0–0.2)
Basos: 1 %
EOS (ABSOLUTE): 0 10*3/uL (ref 0.0–0.4)
Eos: 1 %
Hematocrit: 37.2 % (ref 34.0–46.6)
Hemoglobin: 12.5 g/dL (ref 11.1–15.9)
Immature Grans (Abs): 0 10*3/uL (ref 0.0–0.1)
Immature Granulocytes: 0 %
Lymphocytes Absolute: 3.2 10*3/uL — ABNORMAL HIGH (ref 0.7–3.1)
Lymphs: 53 %
MCH: 31.5 pg (ref 26.6–33.0)
MCHC: 33.6 g/dL (ref 31.5–35.7)
MCV: 94 fL (ref 79–97)
Monocytes Absolute: 0.4 10*3/uL (ref 0.1–0.9)
Monocytes: 7 %
Neutrophils Absolute: 2.2 10*3/uL (ref 1.4–7.0)
Neutrophils: 38 %
Platelets: 214 10*3/uL (ref 150–450)
RBC: 3.97 x10E6/uL (ref 3.77–5.28)
RDW: 13 % (ref 11.7–15.4)
Rheumatoid fact SerPl-aCnc: <10 IU/mL (ref <14.0)
Sed Rate: 7 mm/hr (ref 0–32)
Uric Acid: 5.4 mg/dL (ref 2.6–6.2)
WBC: 5.9 10*3/uL (ref 3.4–10.8)

## 2020-12-03 LAB — BASIC METABOLIC PANEL
BUN/Creatinine Ratio: 20 (ref 9–23)
BUN: 19 mg/dL (ref 6–20)
CO2: 17 mmol/L — ABNORMAL LOW (ref 20–29)
Calcium: 9.8 mg/dL (ref 8.7–10.2)
Chloride: 111 mmol/L — ABNORMAL HIGH (ref 96–106)
Creatinine, Ser: 0.95 mg/dL (ref 0.57–1.00)
Glucose: 88 mg/dL (ref 65–99)
Potassium: 3.9 mmol/L (ref 3.5–5.2)
Sodium: 143 mmol/L (ref 134–144)
eGFR: 86 mL/min/{1.73_m2} (ref >59)

## 2020-12-03 LAB — HEMOGLOBIN A1C
Est. average glucose Bld gHb Est-mCnc: 100 mg/dL
Hgb A1c MFr Bld: 5.1 % (ref 4.8–5.6)

## 2020-12-08 ENCOUNTER — Other Ambulatory Visit: Payer: Self-pay

## 2020-12-08 ENCOUNTER — Ambulatory Visit: Payer: Medicaid Other | Attending: Infectious Diseases | Admitting: Infectious Diseases

## 2020-12-08 DIAGNOSIS — B20 Human immunodeficiency virus [HIV] disease: Secondary | ICD-10-CM
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
## 2020-12-16 ENCOUNTER — Other Ambulatory Visit: Payer: Self-pay

## 2020-12-16 MED ORDER — DARUNAVIR-COBICISTAT 800-150 MG PO TABS: 1.0000 | ORAL_TABLET | Freq: Every day | ORAL | 6 refills | Status: DC

## 2020-12-16 MED ORDER — DOLUTEGRAVIR SODIUM 50 MG PO TABS: 50.0000 mg | ORAL_TABLET | Freq: Every day | ORAL | 6 refills | Status: DC

## 2020-12-29 ENCOUNTER — Other Ambulatory Visit: Payer: Self-pay

## 2021-01-06 ENCOUNTER — Other Ambulatory Visit: Payer: Self-pay

## 2021-01-06 ENCOUNTER — Ambulatory Visit (INDEPENDENT_AMBULATORY_CARE_PROVIDER_SITE_OTHER): Payer: Medicaid Other | Admitting: Podiatry

## 2021-01-06 DIAGNOSIS — G990 Autonomic neuropathy in diseases classified elsewhere: Secondary | ICD-10-CM | POA: Diagnosis not present

## 2021-01-06 DIAGNOSIS — M216X1 Other acquired deformities of right foot: Secondary | ICD-10-CM | POA: Diagnosis not present

## 2021-01-06 NOTE — Progress Notes (Signed)
  Subjective:  Patient ID: Crystal Black, female    DOB: March 26, 1996,  MRN: [id number]  Chief Complaint  Patient presents with   Foot Pain    Follow up pain and swelling of the left foot - lab results    25 y.o. female returns for follow-up with the above complaint. History confirmed with patient.  She has diabetes with use of metformin.  Here with a Geophysicist/field seismologist.  Doing much better after being in the CAM boot  Objective:  Physical Exam: warm, good capillary refill, no trophic changes or ulcerative lesions, normal DP and PT pulses, and normal sensory exam.  Elongated thickened nails.  Unable to reproduce pain in the foot.  Nonspecific.   Radiographs: X-ray of both feet: no fracture, dislocation, swelling or degenerative changes noted Assessment:   1. Peripheral autonomic neuropathy due to underlying disease       Plan:  Patient was evaluated and treated and all questions answered.  Doing much better she not having much pain.  I reviewed her lab work which was normal.  No evidence of gout.  Possible this is neuropathy return as needed.  Discontinue boot and resume regular shoe gear    Return if symptoms worsen or fail to improve.

## 2021-05-04 ENCOUNTER — Other Ambulatory Visit
Admission: RE | Admit: 2021-05-04 | Discharge: 2021-05-04 | Disposition: A | Discharge status: Home / Self Care | Payer: Medicaid Other | Attending: Infectious Diseases | Admitting: Infectious Diseases

## 2021-05-04 ENCOUNTER — Ambulatory Visit: Payer: Medicaid Other | Attending: Infectious Diseases | Admitting: Infectious Diseases

## 2021-05-04 ENCOUNTER — Other Ambulatory Visit: Payer: Self-pay

## 2021-05-04 VITALS — BP 118/76 | HR 88 | Resp 16 | Ht 61.0 in | Wt 174.5 lb

## 2021-05-04 DIAGNOSIS — I10 Essential (primary) hypertension: Secondary | ICD-10-CM | POA: Diagnosis not present

## 2021-05-04 DIAGNOSIS — F603 Borderline personality disorder: Secondary | ICD-10-CM | POA: Insufficient documentation

## 2021-05-04 DIAGNOSIS — F4310 Post-traumatic stress disorder, unspecified: Secondary | ICD-10-CM | POA: Insufficient documentation

## 2021-05-04 DIAGNOSIS — F259 Schizoaffective disorder, unspecified: Secondary | ICD-10-CM | POA: Diagnosis not present

## 2021-05-04 DIAGNOSIS — Z79899 Other long term (current) drug therapy: Secondary | ICD-10-CM | POA: Diagnosis not present

## 2021-05-04 DIAGNOSIS — Z7951 Long term (current) use of inhaled steroids: Secondary | ICD-10-CM | POA: Diagnosis not present

## 2021-05-04 DIAGNOSIS — F17210 Nicotine dependence, cigarettes, uncomplicated: Secondary | ICD-10-CM | POA: Insufficient documentation

## 2021-05-04 DIAGNOSIS — B20 Human immunodeficiency virus [HIV] disease: Secondary | ICD-10-CM | POA: Insufficient documentation

## 2021-05-04 DIAGNOSIS — F329 Major depressive disorder, single episode, unspecified: Secondary | ICD-10-CM | POA: Insufficient documentation

## 2021-05-04 DIAGNOSIS — T7451XA Adult forced sexual exploitation, confirmed, initial encounter: Secondary | ICD-10-CM | POA: Diagnosis not present

## 2021-05-04 DIAGNOSIS — J45909 Unspecified asthma, uncomplicated: Secondary | ICD-10-CM | POA: Insufficient documentation

## 2021-05-04 DIAGNOSIS — F913 Oppositional defiant disorder: Secondary | ICD-10-CM | POA: Diagnosis not present

## 2021-05-04 DIAGNOSIS — G629 Polyneuropathy, unspecified: Secondary | ICD-10-CM | POA: Insufficient documentation

## 2021-05-04 DIAGNOSIS — Z21 Asymptomatic human immunodeficiency virus [HIV] infection status: Secondary | ICD-10-CM | POA: Diagnosis present

## 2021-05-04 DIAGNOSIS — K219 Gastro-esophageal reflux disease without esophagitis: Secondary | ICD-10-CM | POA: Diagnosis not present

## 2021-05-04 LAB — COMPREHENSIVE METABOLIC PANEL
ALT: 16 U/L (ref 0–44)
AST: 15 U/L (ref 15–41)
Albumin: 3.9 g/dL (ref 3.5–5.0)
Alkaline Phosphatase: 46 U/L (ref 38–126)
Anion gap: 8 (ref 5–15)
BUN: 16 mg/dL (ref 6–20)
CO2: 21 mmol/L — ABNORMAL LOW (ref 22–32)
Calcium: 9 mg/dL (ref 8.9–10.3)
Chloride: 110 mmol/L (ref 98–111)
Creatinine, Ser: 1.04 mg/dL — ABNORMAL HIGH (ref 0.44–1.00)
GFR, Estimated: >60 mL/min (ref >60)
Glucose, Bld: 74 mg/dL (ref 70–99)
Potassium: 3.7 mmol/L (ref 3.5–5.1)
Sodium: 139 mmol/L (ref 135–145)
Total Bilirubin: 0.6 mg/dL (ref 0.3–1.2)
Total Protein: 8 g/dL (ref 6.5–8.1)

## 2021-05-04 MED ORDER — DARUNAVIR-COBICISTAT 800-150 MG PO TABS: 1.0000 | ORAL_TABLET | Freq: Every day | ORAL | 6 refills | Status: DC

## 2021-05-04 MED ORDER — DOLUTEGRAVIR SODIUM 50 MG PO TABS: 50.0000 mg | ORAL_TABLET | Freq: Every day | ORAL | 6 refills | Status: DC

## 2021-05-04 NOTE — Patient Instructions (Signed)
You are here for follow up of HIV-and you are doing well- you take your meds every days  today you will do labs- continue prezcobix and tivicay-  Follow up 6 months
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
## 2021-05-05 LAB — T-HELPER CELLS CD4/CD8 %
% CD 4 Pos. Lymph.: 38.9 % (ref 30.8–58.5)
Absolute CD 4 Helper: 1167 /uL (ref 359–1519)
Basophils Absolute: 0 10*3/uL (ref 0.0–0.2)
Basos: 1 %
CD3+CD4+ Cells/CD3+CD8+ Cells Bld: 1.24 (ref 0.92–3.72)
CD3+CD8+ Cells # Bld: 942 /uL — ABNORMAL HIGH (ref 109–897)
CD3+CD8+ Cells NFr Bld: 31.4 % (ref 12.0–35.5)
EOS (ABSOLUTE): 0.1 10*3/uL (ref 0.0–0.4)
Eos: 1 %
Hematocrit: 36.3 % (ref 34.0–46.6)
Hemoglobin: 12.4 g/dL (ref 11.1–15.9)
Immature Grans (Abs): 0 10*3/uL (ref 0.0–0.1)
Immature Granulocytes: 1 %
Lymphocytes Absolute: 3 10*3/uL (ref 0.7–3.1)
Lymphs: 47 %
MCH: 32.3 pg (ref 26.6–33.0)
MCHC: 34.2 g/dL (ref 31.5–35.7)
MCV: 95 fL (ref 79–97)
Monocytes Absolute: 0.4 10*3/uL (ref 0.1–0.9)
Monocytes: 7 %
Neutrophils Absolute: 2.6 10*3/uL (ref 1.4–7.0)
Neutrophils: 43 %
Platelets: 227 10*3/uL (ref 150–450)
RBC: 3.84 x10E6/uL (ref 3.77–5.28)
RDW: 13.5 % (ref 11.7–15.4)
WBC: 6.1 10*3/uL (ref 3.4–10.8)

## 2021-05-05 LAB — HIV-1 RNA QUANT-NO REFLEX-BLD
HIV 1 RNA Quant: <20 copies/mL
LOG10 HIV-1 RNA: UNDETERMINED log10copy/mL

## 2021-05-05 LAB — RPR: RPR Ser Ql: NONREACTIVE

## 2021-05-10 LAB — QUANTIFERON-TB GOLD PLUS (RQFGPL)
QuantiFERON Mitogen Value: >10 IU/mL
QuantiFERON Nil Value: 0.07 IU/mL
QuantiFERON TB1 Ag Value: 0.05 IU/mL
QuantiFERON TB2 Ag Value: 0.05 IU/mL

## 2021-05-10 LAB — QUANTIFERON-TB GOLD PLUS: QuantiFERON-TB Gold Plus: NEGATIVE

## 2021-06-16 LAB — MISC LABCORP TEST (SEND OUT): Labcorp test code: 551776

## 2021-06-30 ENCOUNTER — Ambulatory Visit: Payer: Medicaid Other | Admitting: Podiatry

## 2021-07-18 ENCOUNTER — Emergency Department
Admission: EM | Admit: 2021-07-18 | Discharge: 2021-07-18 | Disposition: A | Discharge status: Home / Self Care | Payer: Medicaid Other | Attending: Emergency Medicine | Admitting: Emergency Medicine

## 2021-07-18 ENCOUNTER — Encounter: Payer: Self-pay | Admitting: Radiology

## 2021-07-18 ENCOUNTER — Emergency Department: Payer: Medicaid Other

## 2021-07-18 DIAGNOSIS — M436 Torticollis: Secondary | ICD-10-CM | POA: Diagnosis not present

## 2021-07-18 DIAGNOSIS — Z7984 Long term (current) use of oral hypoglycemic drugs: Secondary | ICD-10-CM | POA: Insufficient documentation

## 2021-07-18 DIAGNOSIS — F17210 Nicotine dependence, cigarettes, uncomplicated: Secondary | ICD-10-CM | POA: Insufficient documentation

## 2021-07-18 DIAGNOSIS — J45909 Unspecified asthma, uncomplicated: Secondary | ICD-10-CM | POA: Insufficient documentation

## 2021-07-18 DIAGNOSIS — Z91010 Allergy to peanuts: Secondary | ICD-10-CM | POA: Insufficient documentation

## 2021-07-18 DIAGNOSIS — Z21 Asymptomatic human immunodeficiency virus [HIV] infection status: Secondary | ICD-10-CM | POA: Insufficient documentation

## 2021-07-18 DIAGNOSIS — M542 Cervicalgia: Secondary | ICD-10-CM

## 2021-07-18 MED ORDER — IBUPROFEN 800 MG PO TABS
800.0000 mg | ORAL_TABLET | Freq: Three times a day (TID) | ORAL | 0 refills | Status: DC | PRN
Start: 2021-07-18 — End: 2023-09-08

## 2021-07-18 MED ORDER — CYCLOBENZAPRINE HCL 5 MG PO TABS: ORAL_TABLET | ORAL | 0 refills | Status: DC

## 2021-07-18 MED ORDER — IBUPROFEN 800 MG PO TABS
800.0000 mg | ORAL_TABLET | Freq: Once | ORAL | Status: AC
  Administered 2021-07-18: 800 mg via ORAL
  Filled 2021-07-18: qty 1

## 2021-07-18 MED ORDER — CYCLOBENZAPRINE HCL 10 MG PO TABS
5.0000 mg | ORAL_TABLET | Freq: Once | ORAL | Status: AC
Start: 2021-07-18 — End: 2021-07-18
  Administered 2021-07-18: 5 mg via ORAL
  Filled 2021-07-18: qty 1

## 2021-07-18 NOTE — ED Notes (Signed)
Crystal Black [phone]

## 2021-07-18 NOTE — ED Triage Notes (Signed)
Patient from [place] [facility] via EMS for Neck pain x 2 weeks; a/o x 4; respirations even non-labored; skin warm dry
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
## 2021-07-18 NOTE — ED Notes (Addendum)
Called Glendon Axe (legal guardian) told ETA for pick up is 0900, refused permission for sister to transport at this time

## 2021-07-18 NOTE — Discharge Instructions (Addendum)
You may take medicines as needed for pain and muscle spasms (Motrin/Flexeril #15).  Return to the ER for worsening symptoms, persistent vomiting, difficulty breathing or other concerns.

## 2021-10-12 ENCOUNTER — Ambulatory Visit: Payer: Medicaid Other | Admitting: Podiatry

## 2021-10-12 ENCOUNTER — Ambulatory Visit (INDEPENDENT_AMBULATORY_CARE_PROVIDER_SITE_OTHER): Payer: Medicaid Other

## 2021-10-12 DIAGNOSIS — M7661 Achilles tendinitis, right leg: Secondary | ICD-10-CM
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
## 2021-11-02 ENCOUNTER — Ambulatory Visit: Payer: Medicaid Other | Admitting: Infectious Diseases

## 2021-11-04 ENCOUNTER — Other Ambulatory Visit
Admission: RE | Admit: 2021-11-04 | Discharge: 2021-11-04 | Disposition: A | Discharge status: Home / Self Care | Payer: Medicaid Other | Source: Ambulatory Visit | Attending: Infectious Diseases | Admitting: Infectious Diseases

## 2021-11-04 ENCOUNTER — Ambulatory Visit: Payer: Medicaid Other | Attending: Infectious Diseases | Admitting: Infectious Diseases

## 2021-11-04 ENCOUNTER — Encounter: Payer: Self-pay | Admitting: Infectious Diseases

## 2021-11-04 VITALS — BP 102/71 | HR 90 | Temp 97.3°F | Ht 64.0 in | Wt 185.0 lb

## 2021-11-04 DIAGNOSIS — J45909 Unspecified asthma, uncomplicated: Secondary | ICD-10-CM | POA: Diagnosis not present

## 2021-11-04 DIAGNOSIS — Z21 Asymptomatic human immunodeficiency virus [HIV] infection status: Secondary | ICD-10-CM | POA: Diagnosis not present

## 2021-11-04 DIAGNOSIS — F209 Schizophrenia, unspecified: Secondary | ICD-10-CM

## 2021-11-04 DIAGNOSIS — F603 Borderline personality disorder: Secondary | ICD-10-CM | POA: Diagnosis not present

## 2021-11-04 DIAGNOSIS — Z7729 Contact with and (suspected ) exposure to other hazardous substances: Secondary | ICD-10-CM | POA: Insufficient documentation

## 2021-11-04 DIAGNOSIS — I10 Essential (primary) hypertension: Secondary | ICD-10-CM | POA: Diagnosis not present

## 2021-11-04 DIAGNOSIS — Z9142 Personal history of forced labor or sexual exploitation: Secondary | ICD-10-CM | POA: Diagnosis not present

## 2021-11-04 DIAGNOSIS — K219 Gastro-esophageal reflux disease without esophagitis: Secondary | ICD-10-CM | POA: Insufficient documentation

## 2021-11-04 DIAGNOSIS — F4310 Post-traumatic stress disorder, unspecified: Secondary | ICD-10-CM | POA: Diagnosis not present

## 2021-11-04 DIAGNOSIS — F17210 Nicotine dependence, cigarettes, uncomplicated: Secondary | ICD-10-CM | POA: Diagnosis not present

## 2021-11-04 DIAGNOSIS — B20 Human immunodeficiency virus [HIV] disease: Secondary | ICD-10-CM | POA: Diagnosis present

## 2021-11-04 DIAGNOSIS — F259 Schizoaffective disorder, unspecified: Secondary | ICD-10-CM | POA: Insufficient documentation

## 2021-11-04 LAB — COMPREHENSIVE METABOLIC PANEL
ALT: 16 U/L (ref 0–44)
AST: 17 U/L (ref 15–41)
Albumin: 4.1 g/dL (ref 3.5–5.0)
Alkaline Phosphatase: 42 U/L (ref 38–126)
Anion gap: 7 (ref 5–15)
BUN: 14 mg/dL (ref 6–20)
CO2: 23 mmol/L (ref 22–32)
Calcium: 9.4 mg/dL (ref 8.9–10.3)
Chloride: 109 mmol/L (ref 98–111)
Creatinine, Ser: 1.11 mg/dL — ABNORMAL HIGH (ref 0.44–1.00)
GFR, Estimated: >60 mL/min (ref >60)
Glucose, Bld: 95 mg/dL (ref 70–99)
Potassium: 4 mmol/L (ref 3.5–5.1)
Sodium: 139 mmol/L (ref 135–145)
Total Bilirubin: 0.6 mg/dL (ref 0.3–1.2)
Total Protein: 8 g/dL (ref 6.5–8.1)
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
## 2021-11-05 LAB — T-HELPER CELLS CD4/CD8 %
% CD 4 Pos. Lymph.: 38.5 % (ref 30.8–58.5)
Absolute CD 4 Helper: 1232 /uL (ref 359–1519)
Basophils Absolute: 0 10*3/uL (ref 0.0–0.2)
Basos: 1 %
CD3+CD4+ Cells/CD3+CD8+ Cells Bld: 1.18 (ref 0.92–3.72)
CD3+CD8+ Cells # Bld: 1046 /uL — ABNORMAL HIGH (ref 109–897)
CD3+CD8+ Cells NFr Bld: 32.7 % (ref 12.0–35.5)
EOS (ABSOLUTE): 0 10*3/uL (ref 0.0–0.4)
Eos: 1 %
Hematocrit: 39.8 % (ref 34.0–46.6)
Hemoglobin: 13.2 g/dL (ref 11.1–15.9)
Immature Grans (Abs): 0 10*3/uL (ref 0.0–0.1)
Immature Granulocytes: 0 %
Lymphocytes Absolute: 3.2 10*3/uL — ABNORMAL HIGH (ref 0.7–3.1)
Lymphs: 49 %
MCH: 31.1 pg (ref 26.6–33.0)
MCHC: 33.2 g/dL (ref 31.5–35.7)
MCV: 94 fL (ref 79–97)
Monocytes Absolute: 0.5 10*3/uL (ref 0.1–0.9)
Monocytes: 8 %
Neutrophils Absolute: 2.6 10*3/uL (ref 1.4–7.0)
Neutrophils: 41 %
Platelets: 230 10*3/uL (ref 150–450)
RBC: 4.24 x10E6/uL (ref 3.77–5.28)
RDW: 13.1 % (ref 11.7–15.4)
WBC: 6.4 10*3/uL (ref 3.4–10.8)

## 2021-11-05 LAB — HIV-1 RNA QUANT-NO REFLEX-BLD
HIV 1 RNA Quant: <20 copies/mL
LOG10 HIV-1 RNA: UNDETERMINED log10copy/mL

## 2021-11-05 LAB — RPR: RPR Ser Ql: NONREACTIVE

## 2021-11-18 ENCOUNTER — Ambulatory Visit (INDEPENDENT_AMBULATORY_CARE_PROVIDER_SITE_OTHER): Payer: Medicaid Other | Admitting: Podiatry

## 2021-11-18 DIAGNOSIS — M7661 Achilles tendinitis, right leg: Secondary | ICD-10-CM | POA: Diagnosis not present

## 2021-11-18 DIAGNOSIS — M21861 Other specified acquired deformities of right lower leg: Secondary | ICD-10-CM

## 2021-11-18 MED ORDER — MELOXICAM 15 MG PO TABS: 15.0000 mg | ORAL_TABLET | Freq: Every day | ORAL | 0 refills | Status: DC

## 2021-11-18 MED ORDER — METHYLPREDNISOLONE 4 MG PO TBPK: ORAL_TABLET | ORAL | 0 refills | Status: DC

## 2021-11-23 ENCOUNTER — Encounter: Payer: Self-pay | Admitting: Podiatry
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
## 2022-01-11 ENCOUNTER — Other Ambulatory Visit: Payer: Self-pay

## 2022-01-11 MED ORDER — DARUNAVIR-COBICISTAT 800-150 MG PO TABS: 1.0000 | ORAL_TABLET | Freq: Every day | ORAL | 6 refills | Status: DC

## 2022-01-11 MED ORDER — DOLUTEGRAVIR SODIUM 50 MG PO TABS: 50.0000 mg | ORAL_TABLET | Freq: Every day | ORAL | 6 refills | Status: DC

## 2022-02-17 ENCOUNTER — Emergency Department
Admission: EM | Admit: 2022-02-17 | Discharge: 2022-02-17 | Disposition: A | Discharge status: Home / Self Care | Payer: Medicaid Other | Attending: Emergency Medicine | Admitting: Emergency Medicine

## 2022-02-17 ENCOUNTER — Other Ambulatory Visit: Payer: Self-pay

## 2022-02-17 DIAGNOSIS — K5909 Other constipation: Secondary | ICD-10-CM | POA: Diagnosis not present

## 2022-02-17 DIAGNOSIS — R339 Retention of urine, unspecified: Secondary | ICD-10-CM | POA: Diagnosis present

## 2022-02-17 DIAGNOSIS — Z21 Asymptomatic human immunodeficiency virus [HIV] infection status: Secondary | ICD-10-CM | POA: Diagnosis not present

## 2022-02-17 DIAGNOSIS — N390 Urinary tract infection, site not specified: Secondary | ICD-10-CM | POA: Diagnosis not present

## 2022-02-17 LAB — URINALYSIS, ROUTINE W REFLEX MICROSCOPIC
Bilirubin Urine: NEGATIVE
Glucose, UA: NEGATIVE mg/dL
Hgb urine dipstick: NEGATIVE
Ketones, ur: NEGATIVE mg/dL
Nitrite: POSITIVE — AB
Protein, ur: 30 mg/dL — AB
Specific Gravity, Urine: 1.021 (ref 1.005–1.030)
pH: 5 (ref 5.0–8.0)

## 2022-02-17 MED ORDER — CEFDINIR 300 MG PO CAPS: 300.0000 mg | ORAL_CAPSULE | Freq: Two times a day (BID) | ORAL | 0 refills | Status: AC

## 2022-02-17 MED ORDER — POLYETHYLENE GLYCOL 3350 17 GM/SCOOP PO POWD: 17.0000 g | Freq: Once | ORAL | 0 refills | Status: AC

## 2022-02-17 MED ORDER — POLYETHYLENE GLYCOL 3350 17 GM/SCOOP PO POWD: 17.0000 g | Freq: Once | ORAL | 0 refills | Status: DC

## 2022-02-17 MED ORDER — CEFDINIR 300 MG PO CAPS: 300.0000 mg | ORAL_CAPSULE | Freq: Two times a day (BID) | ORAL | 0 refills | Status: DC

## 2022-02-17 NOTE — Discharge Instructions (Addendum)
Please use MiraLAX one half capful every hour until your first bowel movement.  Please do not take any MiraLAX after this for at least 24 hours.  You may use one half capful twice a day of MiraLAX in order to have 1 solid well-formed bowel movement per day.  You may increase or decrease this dosage as needed to obtain this 1 well-formed bowel movement.  Please make sure that you are drinking at least 8 ounces of water every hour during this initial bowel regimen. ?

## 2022-02-17 NOTE — ED Notes (Signed)
Pt bladder scanned and result showed 0 ml, ABD non tender to touch and not firm to touch

## 2022-02-17 NOTE — ED Notes (Signed)
PVR 88 mL

## 2022-02-17 NOTE — ED Triage Notes (Signed)
Pt presents to ED with c/o of urinary retention and constipation for 1 week. Pt states "I only peed 5 drops yesterday". NAD noted. Caregiver with pt.

## 2022-02-17 NOTE — ED Notes (Signed)
Crystal Black, legal guardian aware and gives consent to treat.

## 2022-02-17 NOTE — ED Provider Notes (Signed)
[facility] Provider Note   Event Date/Time   First MD Initiated Contact with Patient 02/17/22 1141     (approximate) History  Urinary Retention  HPI Crystal Black is a 26 y.o. female with a past medical history of HIV, borderline personality disorder, polysubstance abuse, major depressive disorder, PTSD, oppositional defiant disorder, and schizoaffective disorder who presents with complaints of urinary retention and constipation over the last week.  Patient states that she does not drink very much water and after drinking some last night she only had "5 drops of pee" throughout the last 24 hours.  Patient also complains of suprapubic abdominal pain and constipation over the last week as well. ROS: Patient currently denies any vision changes, tinnitus, difficulty speaking, facial droop, sore throat, chest pain, shortness of breath, nausea/vomiting/diarrhea, dysuria, or weakness/numbness/paresthesias in any extremity   Physical Exam  Triage Vital Signs: ED Triage Vitals  Enc Vitals Group     BP 02/17/22 1111 115/82     Pulse Rate 02/17/22 1111 97     Resp 02/17/22 1111 17     Temp 02/17/22 1111 97.7 F (36.5 C)     Temp Source 02/17/22 1111 Oral     SpO2 02/17/22 1111 98 %     Weight 02/17/22 1147 184 lb 15.5 oz (83.9 kg)     Height 02/17/22 1147 5' (1.524 m)     Head Circumference --      Peak Flow --      Pain Score 02/17/22 1115 8     Pain Loc --      Pain Edu? --      Excl. in [place]? --    Most recent vital signs: Vitals:   02/17/22 1111 02/17/22 1115  BP: 115/82   Pulse: 97 98  Resp: 17 17  Temp: 97.7 F (36.5 C) 97.7 F (36.5 C)  SpO2: 98%    General: Awake, oriented x4. CV:  Good peripheral perfusion.  Resp:  Normal effort.  Abd:  No distention.  Mild suprapubic abdominal tenderness to palpation Other:  Obese middle-aged Caucasian female laying in bed in no acute distress ED Results / Procedures / Treatments  Labs (all labs ordered are  listed, but only abnormal results are displayed) Labs Reviewed  URINALYSIS, ROUTINE W REFLEX MICROSCOPIC - Abnormal; Notable for the following components:      Result Value   Color, Urine YELLOW (*)    APPearance CLOUDY (*)    Protein, ur 30 (*)    Nitrite POSITIVE (*)    Leukocytes,Ua MODERATE (*)    Bacteria, UA MANY (*)    All other components within normal limits   PROCEDURES: Critical Care performed: No Procedures MEDICATIONS ORDERED IN ED: Medications - No data to display IMPRESSION / MDM / ASSESSMENT AND PLAN / ED COURSE  I reviewed the triage vital signs and the nursing notes.                             The patient is on the cardiac monitor to evaluate for evidence of arrhythmia and/or significant heart rate changes. Patient's presentation is most consistent with acute presentation with potential threat to life or bodily function. Not Pregnant. Unlikely TOA, Ovarian Torsion, PID, gonorrhea/chlamydia. Low suspicion for Infected Urolithiasis, AAA, Cholecystitis, Pancreatitis, SBO, Appendicitis, or other acute abdomen. Patients history and exam most consistent with UTI and constipation as an etiology for their pain and sensation of urinary retention  Patients symptoms not typical for other emergent causes of abdominal pain such as, but not limited to, appendicitis, abdominal aortic aneurysm, pancreatitis, SBO, mesenteric ischemia, serious intra-abdominal bacterial illness.  Patient without red flags concerning for cancer as a constipation etiology.  Rx: Miralax, Cefdinir 300 mg BID for 5 days Disposition: Discharge home. SRP discussed. Advise follow up with primary care provider within 24-72 hours.   FINAL CLINICAL IMPRESSION(S) / ED DIAGNOSES   Final diagnoses:  Lower urinary tract infectious disease  Other constipation   Rx / DC Orders   ED Discharge Orders          Ordered    cefdinir (OMNICEF) 300 MG capsule  2 times daily,   Status:  Discontinued         02/17/22 1423    polyethylene glycol powder (GLYCOLAX/MIRALAX) 17 GM/SCOOP powder   Once,   Status:  Discontinued        02/17/22 1423    cefdinir (OMNICEF) 300 MG capsule  2 times daily        02/17/22 1444    polyethylene glycol powder (GLYCOLAX/MIRALAX) 17 GM/SCOOP powder   Once        02/17/22 1444           Note:  This document was prepared using Dragon voice recognition software and may include unintentional dictation errors.   Merwyn Katos, MD 02/17/22 [id number]

## 2022-03-30 ENCOUNTER — Emergency Department: Payer: Medicaid Other

## 2022-03-30 ENCOUNTER — Emergency Department
Admission: EM | Admit: 2022-03-30 | Discharge: 2022-03-30 | Disposition: A | Discharge status: Home / Self Care | Payer: Medicaid Other | Attending: Emergency Medicine | Admitting: Emergency Medicine

## 2022-03-30 ENCOUNTER — Encounter: Payer: Self-pay | Admitting: Emergency Medicine

## 2022-03-30 ENCOUNTER — Other Ambulatory Visit: Payer: Self-pay

## 2022-03-30 DIAGNOSIS — Y9343 Activity, gymnastics: Secondary | ICD-10-CM | POA: Insufficient documentation

## 2022-03-30 DIAGNOSIS — S0990XA Unspecified injury of head, initial encounter: Secondary | ICD-10-CM | POA: Diagnosis not present

## 2022-03-30 DIAGNOSIS — M25551 Pain in right hip: Secondary | ICD-10-CM | POA: Diagnosis not present

## 2022-03-30 DIAGNOSIS — S300XXA Contusion of lower back and pelvis, initial encounter: Secondary | ICD-10-CM

## 2022-03-30 DIAGNOSIS — R102 Pelvic and perineal pain: Secondary | ICD-10-CM | POA: Diagnosis not present

## 2022-03-30 DIAGNOSIS — Z21 Asymptomatic human immunodeficiency virus [HIV] infection status: Secondary | ICD-10-CM | POA: Diagnosis not present

## 2022-03-30 DIAGNOSIS — W01198A Fall on same level from slipping, tripping and stumbling with subsequent striking against other object, initial encounter: Secondary | ICD-10-CM | POA: Diagnosis not present

## 2022-03-30 DIAGNOSIS — Y9239 Other specified sports and athletic area as the place of occurrence of the external cause: Secondary | ICD-10-CM | POA: Insufficient documentation

## 2022-03-30 DIAGNOSIS — W19XXXA Unspecified fall, initial encounter: Secondary | ICD-10-CM

## 2022-03-30 DIAGNOSIS — R519 Headache, unspecified: Secondary | ICD-10-CM | POA: Diagnosis present

## 2022-03-30 LAB — POC URINE PREG, ED: Preg Test, Ur: NEGATIVE

## 2022-03-30 MED ORDER — MORPHINE SULFATE (PF) 4 MG/ML IV SOLN
4.0000 mg | Freq: Once | INTRAVENOUS | Status: AC
  Administered 2022-03-30: 4 mg via INTRAMUSCULAR
  Filled 2022-03-30: qty 1

## 2022-03-30 MED ORDER — ONDANSETRON 4 MG PO TBDP
4.0000 mg | ORAL_TABLET | Freq: Once | ORAL | Status: AC
  Administered 2022-03-30: 4 mg via ORAL
  Filled 2022-03-30: qty 1

## 2022-03-30 NOTE — ED Notes (Signed)
Poc urine pregnancy test is negative

## 2022-03-30 NOTE — ED Provider Notes (Signed)
[facility] Provider Note    Event Date/Time   First MD Initiated Contact with Patient 03/30/22 1319     (approximate)  History   Chief Complaint: Fall  HPI  Crystal Black is a 26 y.o. female with a past medical history of anxiety, HIV, schizophrenia, presents to the emergency department after a fall.  According to the patient she was using exercise equipment today at the gym when she fell backwards by accident hitting the back of her head as well as hitting her right hip/pelvis.  Patient states 10/10 pain in her right hip/pelvis as well as a mild headache and some dizziness.  Physical Exam   Triage Vital Signs: ED Triage Vitals  Enc Vitals Group     BP 03/30/22 1318 (!) 116/47     Pulse Rate 03/30/22 1318 87     Resp 03/30/22 1318 16     Temp 03/30/22 1318 98 F (36.7 C)     Temp Source 03/30/22 1318 Oral     SpO2 03/30/22 1318 99 %     Weight 03/30/22 1315 197 lb (89.4 kg)     Height 03/30/22 1315 5' (1.524 m)     Head Circumference --      Peak Flow --      Pain Score 03/30/22 1315 9     Pain Loc --      Pain Edu? --      Excl. in [place]? --     Most recent vital signs: Vitals:   03/30/22 1318  BP: (!) 116/47  Pulse: 87  Resp: 16  Temp: 98 F (36.7 C)  SpO2: 99%    General: Awake, no distress.  CV:  Good peripheral perfusion.  Regular rate and rhythm  Resp:  Normal effort.  Equal breath sounds bilaterally.  Abd:  No distention.  Soft, nontender.  No rebound or guarding. Other:  States moderate tenderness to palpation of the right hip and mild pain with range of motion although able to range the hip well.  Neurovascular intact distally with 2+ DP pulse.  Sensation intact.   ED Results / Procedures / Treatments    RADIOLOGY  I have reviewed and interpreted the hip x-ray images.  There is no fracture on my evaluation of the images.   MEDICATIONS ORDERED IN ED: Medications  morphine (PF) 4 MG/ML injection 4 mg (has no  administration in time range)  ondansetron (ZOFRAN-ODT) disintegrating tablet 4 mg (has no administration in time range)    IMPRESSION / MDM / ASSESSMENT AND PLAN / ED COURSE  I reviewed the triage vital signs and the nursing notes.  Patient's presentation is most consistent with acute presentation with potential threat to life or bodily function.  Patient presents emergency department after mechanical fall.  States she lost her balance and fell backwards while using workout equipment hitting the back of her head as well as hitting her right hip/pelvis.  Patient states 10 out of 10 pain in her right hip/pelvis.  Mild tenderness palpation of the occipital scalp although no obvious hematoma palpated.  We will obtain CT imaging of the head and C-spine as a precaution as well as imaging of the right hip and pelvis.  We will dose intramuscular pain medication.  Patient agreeable to plan of care.  X-ray images are negative.  CT scan of the head and neck are negative.  Patient appears well.  We will discharge patient from the emergency department with PCP follow-up.  Patient  agreeable to plan of care.  FINAL CLINICAL IMPRESSION(S) / ED DIAGNOSES   Fall Right hip pain Pelvis pain Head injury   Note:  This document was prepared using Dragon voice recognition software and may include unintentional dictation errors.   Harvest Dark, MD 03/30/22 1439

## 2022-03-30 NOTE — ED Triage Notes (Signed)
Pt to ED via [facility] from the [facility]. Pt was on a piece of exercise equipment and was assisted to the ground, fall was from about 4 feet. Pt did hit head. Pt states that she is having some neck pain and dizziness. Pt is having 10/10 pain in her pelvis area on the right side near hip joint. Pt VSS. Pt was given Fentanyl 50 mg IM with EMS with no relief in pain.

## 2022-05-10 ENCOUNTER — Ambulatory Visit: Payer: Medicaid Other | Admitting: Infectious Diseases

## 2022-05-26 ENCOUNTER — Ambulatory Visit: Payer: Medicaid Other | Attending: Infectious Diseases | Admitting: Infectious Diseases

## 2022-05-26 ENCOUNTER — Other Ambulatory Visit
Admission: RE | Admit: 2022-05-26 | Discharge: 2022-05-26 | Disposition: A | Discharge status: Home / Self Care | Payer: Medicaid Other | Attending: Infectious Diseases | Admitting: Infectious Diseases

## 2022-05-26 ENCOUNTER — Encounter: Payer: Self-pay | Admitting: Infectious Diseases

## 2022-05-26 VITALS — BP 112/78 | HR 106 | Temp 97.2°F | Wt 177.0 lb

## 2022-05-26 DIAGNOSIS — B20 Human immunodeficiency virus [HIV] disease: Secondary | ICD-10-CM | POA: Diagnosis not present

## 2022-05-26 DIAGNOSIS — K219 Gastro-esophageal reflux disease without esophagitis: Secondary | ICD-10-CM | POA: Diagnosis not present

## 2022-05-26 DIAGNOSIS — J069 Acute upper respiratory infection, unspecified: Secondary | ICD-10-CM | POA: Insufficient documentation

## 2022-05-26 DIAGNOSIS — J45909 Unspecified asthma, uncomplicated: Secondary | ICD-10-CM | POA: Diagnosis not present

## 2022-05-26 DIAGNOSIS — Z1152 Encounter for screening for COVID-19: Secondary | ICD-10-CM | POA: Insufficient documentation

## 2022-05-26 DIAGNOSIS — F259 Schizoaffective disorder, unspecified: Secondary | ICD-10-CM | POA: Diagnosis not present

## 2022-05-26 DIAGNOSIS — J029 Acute pharyngitis, unspecified: Secondary | ICD-10-CM | POA: Diagnosis not present

## 2022-05-26 DIAGNOSIS — I10 Essential (primary) hypertension: Secondary | ICD-10-CM | POA: Diagnosis not present

## 2022-05-26 DIAGNOSIS — F603 Borderline personality disorder: Secondary | ICD-10-CM | POA: Diagnosis not present

## 2022-05-26 DIAGNOSIS — Z21 Asymptomatic human immunodeficiency virus [HIV] infection status: Secondary | ICD-10-CM | POA: Diagnosis not present

## 2022-05-26 DIAGNOSIS — F4310 Post-traumatic stress disorder, unspecified: Secondary | ICD-10-CM | POA: Insufficient documentation

## 2022-05-26 LAB — RESP PANEL BY RT-PCR (RSV, FLU A&B, COVID)  RVPGX2
Influenza A by PCR: NEGATIVE
Influenza B by PCR: NEGATIVE
Resp Syncytial Virus by PCR: NEGATIVE
SARS Coronavirus 2 by RT PCR: NEGATIVE

## 2022-05-26 LAB — COMPREHENSIVE METABOLIC PANEL
ALT: 18 U/L (ref 0–44)
AST: 18 U/L (ref 15–41)
Albumin: 4.2 g/dL (ref 3.5–5.0)
Alkaline Phosphatase: 38 U/L (ref 38–126)
Anion gap: 6 (ref 5–15)
BUN: 20 mg/dL (ref 6–20)
CO2: 20 mmol/L — ABNORMAL LOW (ref 22–32)
Calcium: 9 mg/dL (ref 8.9–10.3)
Chloride: 115 mmol/L — ABNORMAL HIGH (ref 98–111)
Creatinine, Ser: 1.01 mg/dL — ABNORMAL HIGH (ref 0.44–1.00)
GFR, Estimated: >60 mL/min (ref >60)
Glucose, Bld: 106 mg/dL — ABNORMAL HIGH (ref 70–99)
Potassium: 3.8 mmol/L (ref 3.5–5.1)
Sodium: 141 mmol/L (ref 135–145)
Total Bilirubin: 0.7 mg/dL (ref 0.3–1.2)
Total Protein: 7.9 g/dL (ref 6.5–8.1)

## 2022-05-26 LAB — CBC WITH DIFFERENTIAL/PLATELET
Abs Immature Granulocytes: 0.01 10*3/uL (ref 0.00–0.07)
Basophils Absolute: 0 10*3/uL (ref 0.0–0.1)
Basophils Relative: 0 %
Eosinophils Absolute: 0 10*3/uL (ref 0.0–0.5)
Eosinophils Relative: 0 %
HCT: 37.5 % (ref 36.0–46.0)
Hemoglobin: 12.9 g/dL (ref 12.0–15.0)
Immature Granulocytes: 0 %
Lymphocytes Relative: 28 %
Lymphs Abs: 1.9 10*3/uL (ref 0.7–4.0)
MCH: 32.1 pg (ref 26.0–34.0)
MCHC: 34.4 g/dL (ref 30.0–36.0)
MCV: 93.3 fL (ref 80.0–100.0)
Monocytes Absolute: 0.4 10*3/uL (ref 0.1–1.0)
Monocytes Relative: 5 %
Neutro Abs: 4.6 10*3/uL (ref 1.7–7.7)
Neutrophils Relative %: 67 %
Platelets: 187 10*3/uL (ref 150–400)
RBC: 4.02 MIL/uL (ref 3.87–5.11)
RDW: 12.4 % (ref 11.5–15.5)
WBC: 6.9 10*3/uL (ref 4.0–10.5)
nRBC: 0 % (ref 0.0–0.2)

## 2022-05-26 LAB — GROUP A STREP BY PCR: Group A Strep by PCR: NOT DETECTED

## 2022-05-26 MED ORDER — DARUNAVIR-COBICISTAT 800-150 MG PO TABS: 1.0000 | ORAL_TABLET | Freq: Every day | ORAL | 6 refills | Status: DC

## 2022-05-26 MED ORDER — DOLUTEGRAVIR SODIUM 50 MG PO TABS: 50.0000 mg | ORAL_TABLET | Freq: Every day | ORAL | 6 refills | Status: DC

## 2022-05-26 MED ORDER — BENZONATATE 100 MG PO CAPS: 100.0000 mg | ORAL_CAPSULE | Freq: Three times a day (TID) | ORAL | 0 refills | Status: DC | PRN

## 2022-05-26 NOTE — Patient Instructions (Addendum)
You are here for follow up of HIV-= you are doing well- you have been having painful throat and body ache and back ache- will check flu and strep throat. Take tessalon pearls will follow up after results
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
## 2022-05-27 LAB — T-HELPER CELLS CD4/CD8 %
% CD 4 Pos. Lymph.: 39.5 % (ref 30.8–58.5)
Absolute CD 4 Helper: 751 /uL (ref 359–1519)
Basophils Absolute: 0 10*3/uL (ref 0.0–0.2)
Basos: 0 %
CD3+CD4+ Cells/CD3+CD8+ Cells Bld: 1.22 (ref 0.92–3.72)
CD3+CD8+ Cells # Bld: 614 /uL (ref 109–897)
CD3+CD8+ Cells NFr Bld: 32.3 % (ref 12.0–35.5)
EOS (ABSOLUTE): 0 10*3/uL (ref 0.0–0.4)
Eos: 0 %
Hematocrit: 38.7 % (ref 34.0–46.6)
Hemoglobin: 12.9 g/dL (ref 11.1–15.9)
Immature Grans (Abs): 0 10*3/uL (ref 0.0–0.1)
Immature Granulocytes: 0 %
Lymphocytes Absolute: 1.9 10*3/uL (ref 0.7–3.1)
Lymphs: 27 %
MCH: 31.2 pg (ref 26.6–33.0)
MCHC: 33.3 g/dL (ref 31.5–35.7)
MCV: 94 fL (ref 79–97)
Monocytes Absolute: 0.4 10*3/uL (ref 0.1–0.9)
Monocytes: 6 %
Neutrophils Absolute: 4.7 10*3/uL (ref 1.4–7.0)
Neutrophils: 67 %
Platelets: 185 10*3/uL (ref 150–450)
RBC: 4.13 x10E6/uL (ref 3.77–5.28)
RDW: 12.9 % (ref 11.7–15.4)
WBC: 7.1 10*3/uL (ref 3.4–10.8)

## 2022-05-27 LAB — RPR: RPR Ser Ql: NONREACTIVE

## 2022-05-30 ENCOUNTER — Telehealth: Payer: Self-pay

## 2022-05-30 LAB — QUANTIFERON-TB GOLD PLUS (RQFGPL)
QuantiFERON Mitogen Value: >10 IU/mL
QuantiFERON Nil Value: 0 IU/mL
QuantiFERON TB1 Ag Value: 0 IU/mL
QuantiFERON TB2 Ag Value: 0 IU/mL

## 2022-05-30 LAB — QUANTIFERON-TB GOLD PLUS: QuantiFERON-TB Gold Plus: NEGATIVE

## 2022-05-30 NOTE — Telephone Encounter (Signed)
-----   Message from Lynn Ito, MD sent at 05/27/2022  1:13 PM EST ----- Can you please let hetr know that strep  and flu and covid were negative- she has to just continue symptomatic treament for sore throat like tessalon pearls- thx ----- Message ----- From: Interface, Lab In [facility] Sent: 05/26/2022   1:51 PM EST To: Lynn Ito, MD

## 2022-05-30 NOTE — Telephone Encounter (Signed)
Left voicemail asking patient to return my call.   Jet Armbrust P Ivie Maese, CMA  

## 2022-05-31 ENCOUNTER — Emergency Department
Admission: EM | Admit: 2022-05-31 | Discharge: 2022-06-01 | Disposition: A | Discharge status: Home / Self Care | Payer: Medicaid Other | Attending: Emergency Medicine | Admitting: Emergency Medicine

## 2022-05-31 ENCOUNTER — Encounter: Payer: Self-pay | Admitting: Emergency Medicine

## 2022-05-31 ENCOUNTER — Other Ambulatory Visit: Payer: Self-pay

## 2022-05-31 ENCOUNTER — Emergency Department: Payer: Medicaid Other

## 2022-05-31 DIAGNOSIS — R251 Tremor, unspecified: Secondary | ICD-10-CM | POA: Insufficient documentation

## 2022-05-31 DIAGNOSIS — M25571 Pain in right ankle and joints of right foot: Secondary | ICD-10-CM | POA: Insufficient documentation

## 2022-05-31 LAB — CBC
HCT: 36.9 % (ref 36.0–46.0)
Hemoglobin: 12.3 g/dL (ref 12.0–15.0)
MCH: 31.3 pg (ref 26.0–34.0)
MCHC: 33.3 g/dL (ref 30.0–36.0)
MCV: 93.9 fL (ref 80.0–100.0)
Platelets: 193 10*3/uL (ref 150–400)
RBC: 3.93 MIL/uL (ref 3.87–5.11)
RDW: 12.8 % (ref 11.5–15.5)
WBC: 6.7 10*3/uL (ref 4.0–10.5)
nRBC: 0 % (ref 0.0–0.2)

## 2022-05-31 LAB — BASIC METABOLIC PANEL
Anion gap: 6 (ref 5–15)
BUN: 16 mg/dL (ref 6–20)
CO2: 21 mmol/L — ABNORMAL LOW (ref 22–32)
Calcium: 9.4 mg/dL (ref 8.9–10.3)
Chloride: 113 mmol/L — ABNORMAL HIGH (ref 98–111)
Creatinine, Ser: 0.92 mg/dL (ref 0.44–1.00)
GFR, Estimated: >60 mL/min (ref >60)
Glucose, Bld: 121 mg/dL — ABNORMAL HIGH (ref 70–99)
Potassium: 3.8 mmol/L (ref 3.5–5.1)
Sodium: 140 mmol/L (ref 135–145)

## 2022-05-31 LAB — URINALYSIS, ROUTINE W REFLEX MICROSCOPIC
Bilirubin Urine: NEGATIVE
Glucose, UA: NEGATIVE mg/dL
Hgb urine dipstick: NEGATIVE
Ketones, ur: NEGATIVE mg/dL
Leukocytes,Ua: NEGATIVE
Nitrite: NEGATIVE
Protein, ur: NEGATIVE mg/dL
Specific Gravity, Urine: 1.023 (ref 1.005–1.030)
pH: 5 (ref 5.0–8.0)

## 2022-05-31 LAB — POC URINE PREG, ED: Preg Test, Ur: NEGATIVE

## 2022-05-31 LAB — CBG MONITORING, ED: Glucose-Capillary: 119 mg/dL — ABNORMAL HIGH (ref 70–99)

## 2022-05-31 NOTE — ED Triage Notes (Signed)
Patient into triage in wheelchair c/o either having a seizure of a panic attack. Patient reports she was standing up and her legs began shaking uncontrollably. Patient reports not eating over the past few days due to sore throat. Was seen by PCP and was negative for strep.

## 2022-05-31 NOTE — Telephone Encounter (Signed)
Attempted to reach patient to relay lab results. Patient currently in the ED

## 2022-05-31 NOTE — ED Provider Triage Note (Signed)
Emergency Medicine Provider Triage Evaluation Note  Crystal Black , a 26 y.o. female  was evaluated in triage.  Pt complains of shaking uncontrollably, but is not cold. Symptoms started earlier today. She is concerned that she may be having seizures. History of seizures as a child, but not currently on any medications. She is also having right ankle pain after falling out of bed last night.  Physical Exam  BP 113/84 (BP Location: Left Arm)   Pulse 88   Temp 98.3 F (36.8 C) (Oral)   Resp 16   Ht 5' (1.524 m)   Wt 80.3 kg   SpO2 96%   BMI 34.57 kg/m  Gen:   Awake, no distress   Resp:  Normal effort  MSK:   Moves extremities without difficulty  Other:    Medical Decision Making  Medically screening exam initiated at 4:33 PM.  Appropriate orders placed.  Crystal Black was informed that the remainder of the evaluation will be completed by another provider, this initial triage assessment does not replace that evaluation, and the importance of remaining in the ED until their evaluation is complete.    Crystal Pester, FNP 05/31/22 1636

## 2022-05-31 NOTE — ED Triage Notes (Signed)
First Nurse: Pt here via [facility] from a group home. Pt started shaking for 20 mins. Pt takes abx but unsure of why she is taking them. Pt cbg read low, 100cc of D10 given by ems, 154 new cbg. Hx of borderline diabetic, 18G RAC.   103 97% 35 co2 22 129/77 97.2

## 2022-06-01 NOTE — Telephone Encounter (Signed)
Attempted to contact group home to relay still no answer and vm left for them to call back Crystal Black T [place]

## 2022-06-02 MED ORDER — DOLUTEGRAVIR SODIUM 50 MG PO TABS: 50.0000 mg | ORAL_TABLET | Freq: Every day | ORAL | 6 refills | Status: DC

## 2022-06-02 MED ORDER — DARUNAVIR-COBICISTAT 800-150 MG PO TABS: 1.0000 | ORAL_TABLET | Freq: Every day | ORAL | 6 refills | Status: DC

## 2022-06-02 NOTE — Addendum Note (Signed)
Addended by: Tressa Busman T on: 06/02/2022 11:16 AM   Modules accepted: Orders

## 2022-06-02 NOTE — Telephone Encounter (Signed)
I spoke to [place] patient guardian and relayed lab results. Dealie Koelzer T Pricilla Loveless

## 2022-06-06 ENCOUNTER — Emergency Department (EMERGENCY_DEPARTMENT_HOSPITAL)
Admission: EM | Admit: 2022-06-06 | Discharge: 2022-06-07 | Disposition: A | Discharge status: Other Acute Inpatient Hospital | Payer: No Typology Code available for payment source | Source: Home / Self Care | Attending: Emergency Medicine | Admitting: Emergency Medicine

## 2022-06-06 ENCOUNTER — Encounter: Payer: Self-pay | Admitting: Emergency Medicine

## 2022-06-06 DIAGNOSIS — R070 Pain in throat: Secondary | ICD-10-CM | POA: Insufficient documentation

## 2022-06-06 DIAGNOSIS — Z91010 Allergy to peanuts: Secondary | ICD-10-CM | POA: Insufficient documentation

## 2022-06-06 DIAGNOSIS — J45909 Unspecified asthma, uncomplicated: Secondary | ICD-10-CM | POA: Insufficient documentation

## 2022-06-06 DIAGNOSIS — F17210 Nicotine dependence, cigarettes, uncomplicated: Secondary | ICD-10-CM | POA: Diagnosis present

## 2022-06-06 DIAGNOSIS — X838XXA Intentional self-harm by other specified means, initial encounter: Secondary | ICD-10-CM | POA: Diagnosis present

## 2022-06-06 DIAGNOSIS — J029 Acute pharyngitis, unspecified: Secondary | ICD-10-CM

## 2022-06-06 DIAGNOSIS — B20 Human immunodeficiency virus [HIV] disease: Secondary | ICD-10-CM | POA: Diagnosis present

## 2022-06-06 DIAGNOSIS — Z21 Asymptomatic human immunodeficiency virus [HIV] infection status: Secondary | ICD-10-CM | POA: Insufficient documentation

## 2022-06-06 DIAGNOSIS — R45851 Suicidal ideations: Secondary | ICD-10-CM | POA: Insufficient documentation

## 2022-06-06 DIAGNOSIS — F331 Major depressive disorder, recurrent, moderate: Secondary | ICD-10-CM | POA: Diagnosis present

## 2022-06-06 DIAGNOSIS — F209 Schizophrenia, unspecified: Secondary | ICD-10-CM | POA: Insufficient documentation

## 2022-06-06 DIAGNOSIS — F061 Catatonic disorder due to known physiological condition: Secondary | ICD-10-CM | POA: Diagnosis present

## 2022-06-06 DIAGNOSIS — F1220 Cannabis dependence, uncomplicated: Secondary | ICD-10-CM | POA: Diagnosis present

## 2022-06-06 DIAGNOSIS — Z1152 Encounter for screening for COVID-19: Secondary | ICD-10-CM | POA: Insufficient documentation

## 2022-06-06 DIAGNOSIS — F4310 Post-traumatic stress disorder, unspecified: Secondary | ICD-10-CM | POA: Diagnosis present

## 2022-06-06 DIAGNOSIS — F1910 Other psychoactive substance abuse, uncomplicated: Secondary | ICD-10-CM | POA: Diagnosis present

## 2022-06-06 DIAGNOSIS — F603 Borderline personality disorder: Secondary | ICD-10-CM | POA: Diagnosis present

## 2022-06-06 DIAGNOSIS — Z7251 High risk heterosexual behavior: Secondary | ICD-10-CM

## 2022-06-06 LAB — CBC
HCT: 39.7 % (ref 36.0–46.0)
Hemoglobin: 13 g/dL (ref 12.0–15.0)
MCH: 31.3 pg (ref 26.0–34.0)
MCHC: 32.7 g/dL (ref 30.0–36.0)
MCV: 95.4 fL (ref 80.0–100.0)
Platelets: 220 K/uL (ref 150–400)
RBC: 4.16 MIL/uL (ref 3.87–5.11)
RDW: 12.8 % (ref 11.5–15.5)
WBC: 7.4 K/uL (ref 4.0–10.5)
nRBC: 0 % (ref 0.0–0.2)

## 2022-06-06 NOTE — ED Notes (Signed)
Pt dressed out by this RN & EDT [employer]) belongings include:   1 pair of white of shoes 1 pair of camo pants 1 gray top 1 blue bra 1 pink and black bag

## 2022-06-06 NOTE — ED Triage Notes (Signed)
Pt presents via PD for SI under IVC. Pt is from [facility] and endorses SI with a plan for the last week. Per paperwork, the patient has recently had a medication change and her behavior has changed since then. It states that she has had flashbacks causing her to want to harm herself with sharp object such a knife or wanting to hang herself. Denies HI.

## 2022-06-07 ENCOUNTER — Other Ambulatory Visit (HOSPITAL_COMMUNITY): Payer: Self-pay

## 2022-06-07 ENCOUNTER — Inpatient Hospital Stay
Admission: EM | Admit: 2022-06-07 | Discharge: 2022-06-09 | DRG: 882 | Disposition: A | Discharge status: Home / Self Care | Payer: No Typology Code available for payment source | Source: Intra-hospital | Attending: Psychiatry | Admitting: Psychiatry

## 2022-06-07 ENCOUNTER — Encounter: Payer: Self-pay | Admitting: Psychiatry

## 2022-06-07 ENCOUNTER — Other Ambulatory Visit: Payer: Self-pay

## 2022-06-07 DIAGNOSIS — R45851 Suicidal ideations: Secondary | ICD-10-CM

## 2022-06-07 DIAGNOSIS — B20 Human immunodeficiency virus [HIV] disease: Secondary | ICD-10-CM | POA: Diagnosis present

## 2022-06-07 DIAGNOSIS — K5900 Constipation, unspecified: Secondary | ICD-10-CM | POA: Diagnosis present

## 2022-06-07 DIAGNOSIS — Z20822 Contact with and (suspected) exposure to covid-19: Secondary | ICD-10-CM | POA: Diagnosis present

## 2022-06-07 DIAGNOSIS — R070 Pain in throat: Secondary | ICD-10-CM | POA: Diagnosis present

## 2022-06-07 DIAGNOSIS — F17210 Nicotine dependence, cigarettes, uncomplicated: Secondary | ICD-10-CM | POA: Diagnosis present

## 2022-06-07 DIAGNOSIS — Z91010 Allergy to peanuts: Secondary | ICD-10-CM | POA: Diagnosis not present

## 2022-06-07 DIAGNOSIS — F209 Schizophrenia, unspecified: Secondary | ICD-10-CM | POA: Diagnosis present

## 2022-06-07 DIAGNOSIS — J45909 Unspecified asthma, uncomplicated: Secondary | ICD-10-CM | POA: Diagnosis present

## 2022-06-07 DIAGNOSIS — F603 Borderline personality disorder: Secondary | ICD-10-CM | POA: Diagnosis present

## 2022-06-07 DIAGNOSIS — F4310 Post-traumatic stress disorder, unspecified: Principal | ICD-10-CM | POA: Diagnosis present

## 2022-06-07 LAB — URINE DRUG SCREEN, QUALITATIVE (ARMC ONLY)
Amphetamines, Ur Screen: NOT DETECTED
Barbiturates, Ur Screen: NOT DETECTED
Benzodiazepine, Ur Scrn: NOT DETECTED
Cannabinoid 50 Ng, Ur ~~LOC~~: NOT DETECTED
Cocaine Metabolite,Ur ~~LOC~~: NOT DETECTED
MDMA (Ecstasy)Ur Screen: NOT DETECTED
Methadone Scn, Ur: NOT DETECTED
Opiate, Ur Screen: NOT DETECTED
Phencyclidine (PCP) Ur S: NOT DETECTED
Tricyclic, Ur Screen: NOT DETECTED

## 2022-06-07 LAB — COMPREHENSIVE METABOLIC PANEL
ALT: 35 U/L (ref 0–44)
AST: 23 U/L (ref 15–41)
Albumin: 4.2 g/dL (ref 3.5–5.0)
Alkaline Phosphatase: 54 U/L (ref 38–126)
Anion gap: 7 (ref 5–15)
BUN: 14 mg/dL (ref 6–20)
CO2: 22 mmol/L (ref 22–32)
Calcium: 8.9 mg/dL (ref 8.9–10.3)
Chloride: 112 mmol/L — ABNORMAL HIGH (ref 98–111)
Creatinine, Ser: 0.88 mg/dL (ref 0.44–1.00)
GFR, Estimated: >60 mL/min (ref >60)
Glucose, Bld: 131 mg/dL — ABNORMAL HIGH (ref 70–99)
Potassium: 4.1 mmol/L (ref 3.5–5.1)
Sodium: 141 mmol/L (ref 135–145)
Total Bilirubin: 0.5 mg/dL (ref 0.3–1.2)
Total Protein: 8 g/dL (ref 6.5–8.1)

## 2022-06-07 LAB — RESP PANEL BY RT-PCR (FLU A&B, COVID) ARPGX2
Influenza A by PCR: NEGATIVE
Influenza B by PCR: NEGATIVE
SARS Coronavirus 2 by RT PCR: NEGATIVE

## 2022-06-07 LAB — POC URINE PREG, ED: Preg Test, Ur: NEGATIVE

## 2022-06-07 LAB — VALPROIC ACID LEVEL: Valproic Acid Lvl: 64 ug/mL (ref 50.0–100.0)

## 2022-06-07 LAB — SALICYLATE LEVEL: Salicylate Lvl: <7 mg/dL — ABNORMAL LOW (ref 7.0–30.0)

## 2022-06-07 LAB — ETHANOL: Alcohol, Ethyl (B): <10 mg/dL (ref <10)

## 2022-06-07 LAB — GROUP A STREP BY PCR: Group A Strep by PCR: NOT DETECTED

## 2022-06-07 LAB — ACETAMINOPHEN LEVEL: Acetaminophen (Tylenol), Serum: <10 ug/mL — ABNORMAL LOW (ref 10–30)

## 2022-06-07 MED ORDER — TOPIRAMATE 25 MG PO TABS
50.0000 mg | ORAL_TABLET | Freq: Two times a day (BID) | ORAL | Status: DC
  Administered 2022-06-07 – 2022-06-09 (×4): 50 mg via ORAL
  Filled 2022-06-07 (×5): qty 2

## 2022-06-07 MED ORDER — ACETAMINOPHEN 325 MG PO TABS: 650.0000 mg | ORAL_TABLET | Freq: Four times a day (QID) | ORAL | Status: DC | PRN

## 2022-06-07 MED ORDER — NICOTINE POLACRILEX 2 MG MT GUM
2.0000 mg | CHEWING_GUM | OROMUCOSAL | Status: DC | PRN
  Administered 2022-06-07 – 2022-06-08 (×2): 2 mg via ORAL
  Filled 2022-06-07 (×3): qty 1

## 2022-06-07 MED ORDER — PRAZOSIN HCL 2 MG PO CAPS
5.0000 mg | ORAL_CAPSULE | Freq: Every day | ORAL | Status: DC
  Administered 2022-06-07 – 2022-06-08 (×2): 5 mg via ORAL
  Filled 2022-06-07 (×2): qty 1

## 2022-06-07 MED ORDER — ALUM & MAG HYDROXIDE-SIMETH 200-200-20 MG/5ML PO SUSP: 30.0000 mL | ORAL | Status: DC | PRN

## 2022-06-07 MED ORDER — MAGNESIUM HYDROXIDE 400 MG/5ML PO SUSP: 30.0000 mL | Freq: Every day | ORAL | Status: DC | PRN

## 2022-06-07 MED ORDER — HYDROXYZINE HCL 25 MG PO TABS: 25.0000 mg | ORAL_TABLET | Freq: Three times a day (TID) | ORAL | Status: DC | PRN

## 2022-06-07 MED ORDER — DIVALPROEX SODIUM 500 MG PO DR TAB
500.0000 mg | DELAYED_RELEASE_TABLET | Freq: Two times a day (BID) | ORAL | Status: DC
  Administered 2022-06-07 – 2022-06-09 (×4): 500 mg via ORAL
  Filled 2022-06-07 (×4): qty 1

## 2022-06-07 MED ORDER — BENZTROPINE MESYLATE 1 MG PO TABS
0.5000 mg | ORAL_TABLET | Freq: Two times a day (BID) | ORAL | Status: DC
  Administered 2022-06-07 – 2022-06-09 (×4): 0.5 mg via ORAL
  Filled 2022-06-07 (×4): qty 1

## 2022-06-07 MED ORDER — DARUNAVIR-COBICISTAT 800-150 MG PO TABS
1.0000 | ORAL_TABLET | Freq: Every day | ORAL | Status: DC
  Administered 2022-06-08 – 2022-06-09 (×2): 1 via ORAL
  Filled 2022-06-07 (×2): qty 1

## 2022-06-07 NOTE — Group Note (Signed)
LCSW Group Therapy Note   Group Date: 06/07/2022 Start Time: 1300 End Time: 1400   Type of Therapy and Topic:  Group Therapy: Challenging Core Beliefs  Participation Level:  None  Description of Group:  Patients were educated about core beliefs and asked to identify one harmful core belief that they have. Patients were asked to explore from where those beliefs originate. Patients were asked to discuss how those beliefs make them feel and the resulting behaviors of those beliefs. They were then be asked if those beliefs are true and, if so, what evidence they have to support them. Lastly, group members were challenged to replace those negative core beliefs with helpful beliefs.   Therapeutic Goals:   1. Patient will identify harmful core beliefs and explore the origins of such beliefs. 2. Patient will identify feelings and behaviors that result from those core beliefs. 3. Patient will discuss whether such beliefs are true. 4.  Patient will replace harmful core beliefs with helpful ones.  Summary of Patient Progress:   Patient was present for entirety of group; did not participate to any capacity.   Therapeutic Modalities: Cognitive Behavioral Therapy; Solution-Focused Therapy   Crystal Black 06/07/2022  2:32 PM
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
## 2022-06-07 NOTE — ED Notes (Signed)
RN contacted    McAdams,Garnetta Other     [phone] group home owner  To update on now plan for admission to [facility]. Agreeable to plan.
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
## 2022-06-07 NOTE — ED Provider Notes (Signed)
[facility] Provider Note    Event Date/Time   First MD Initiated Contact with Patient 05/31/22 1709     (approximate)   History   Hypoglycemia   HPI  Crystal Black is a 26 y.o. female who presents with complaints of shaking.  She reports the symptoms started earlier today, she reports having history of seizures as a child but not currently on any medications.     Physical Exam   Triage Vital Signs: ED Triage Vitals  Enc Vitals Group     BP 05/31/22 1622 113/84     Pulse Rate 05/31/22 1622 88     Resp 05/31/22 1622 16     Temp 05/31/22 1622 98.3 F (36.8 C)     Temp Source 05/31/22 1622 Oral     SpO2 05/31/22 1622 96 %     Weight 05/31/22 1623 80.3 kg (177 lb)     Height 05/31/22 1623 1.524 m (5')     Head Circumference --      Peak Flow --      Pain Score 05/31/22 1623 4     Pain Loc --      Pain Edu? --      Excl. in GC? --     Most recent vital signs: Vitals:   05/31/22 1622  BP: 113/84  Pulse: 88  Resp: 16  Temp: 98.3 F (36.8 C)  SpO2: 96%     General: Awake, no distress.  CV:  Good peripheral perfusion.  Resp:  Normal effort.  Abd:  No distention.  Other:  Mildly tremulous, anxious   ED Results / Procedures / Treatments   Labs (all labs ordered are listed, but only abnormal results are displayed) Labs Reviewed  BASIC METABOLIC PANEL - Abnormal; Notable for the following components:      Result Value   Chloride 113 (*)    CO2 21 (*)    Glucose, Bld 121 (*)    All other components within normal limits  URINALYSIS, ROUTINE W REFLEX MICROSCOPIC - Abnormal; Notable for the following components:   Color, Urine YELLOW (*)    APPearance HAZY (*)    All other components within normal limits  CBG MONITORING, ED - Abnormal; Notable for the following components:   Glucose-Capillary 119 (*)    All other components within normal limits  CBC  POC URINE PREG, ED     EKG     RADIOLOGY  Ankle x-ray viewed  interpreted me, no acute abnormality   PROCEDURES:  Critical Care performed:   Procedures   MEDICATIONS ORDERED IN ED: Medications - No data to display   IMPRESSION / MDM / ASSESSMENT AND PLAN / ED COURSE  I reviewed the triage vital signs and the nursing notes. Patient's presentation is most consistent with acute illness / injury with system symptoms.   Patient with tremors as above, suspect anxiety, no evidence of electrolyte abnormalities.  Her symptoms resolved while I was with her.  She is feeling improved, no indication for further workup this time, prep for discharge.       FINAL CLINICAL IMPRESSION(S) / ED DIAGNOSES   Final diagnoses:  Shaking  Acute right ankle pain     Rx / DC Orders   ED Discharge Orders     None        Note:  This document was prepared using Dragon voice recognition software and may include unintentional dictation errors.   Jene Every, MD 06/07/22 [phone]

## 2022-06-07 NOTE — BH Assessment (Signed)
This Clinical research associate attempted to reach Patient has a Legal Guardian, Purvis Kilts [phone]) to update guardian on pt's plan to be observed overnight and discharged back to the group home later in the morning. A HIPAA compliant voicemail was left requesting a call back.

## 2022-06-07 NOTE — ED Notes (Signed)
Report to Johnny Bridge, RN in [facility]
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
## 2022-06-07 NOTE — ED Notes (Addendum)
RN contacted    McAdams,Garnetta Other   [phone] group home owner  To update on now plan for admission to BMU. No answer, left HIPAA compliant message.
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
## 2022-06-07 NOTE — ED Notes (Signed)
Pt refusing shower.  

## 2022-06-07 NOTE — Tx Team (Signed)
Initial Treatment Plan 06/07/2022 1:29 PM [name] [id number]    PATIENT STRESSORS: Financial difficulties   Health problems   Medication change or noncompliance   Traumatic event     PATIENT STRENGTHS: Ability for insight  Communication skills  Motivation for treatment/growth  Physical Health    PATIENT IDENTIFIED PROBLEMS: Health Risks  Safety Risks   Medication Compliance   Depression Anxiety  PTSD             DISCHARGE CRITERIA:  Ability to meet basic life and health needs Adequate post-discharge living arrangements Improved stabilization in mood, thinking, and/or behavior Medical problems require only outpatient monitoring Reduction of life-threatening or endangering symptoms to within safe limits Safe-care adequate arrangements made Verbal commitment to aftercare and medication compliance  PRELIMINARY DISCHARGE PLAN: Return to previous living arrangement  PATIENT/FAMILY INVOLVEMENT: This treatment plan has been presented to and reviewed with the patient, Crystal Black, and/or family member, .  The patient and family have been given the opportunity to ask questions and make suggestions.  Crystal Haring, RN 06/07/2022, 1:29 PM

## 2022-06-07 NOTE — ED Notes (Addendum)
McAdams,Garnetta Other   [phone] group home owner    RN contacted, Burnett Corrente states that they will come pick up the patient as soon as she is ready for discharge and to call at this number.

## 2022-06-07 NOTE — BH Assessment (Addendum)
Patient is to be admitted to [facility] BMU today 06/07/22 by Dr. Toni Amend.  Attending Physician will be Dr.  Toni Amend .   Patient has been assigned to room 325, by [facility] Charge Nurse, Maryelizabeth Kaufmann.    ER staff is aware of the admission: Melodie, ER Secretary   Dr. Lenard Lance, ER MD  Connye Burkitt, Patient's Nurse  Harrison Mons, Patient Access.

## 2022-06-07 NOTE — ED Notes (Signed)
Breakfast placed at bedside. 
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
## 2022-06-07 NOTE — ED Notes (Signed)
Moved to [facility] via ED tech, security and wheelchair. Sent with all of belongings.

## 2022-06-07 NOTE — ED Notes (Signed)
RN called  Crystal Black,Crystal Black Legal Guardian   [phone]    To inform of pending discharge, left HIPAA complaint VM.

## 2022-06-07 NOTE — ED Notes (Signed)
VOL/pt to be admitted to [facility] today

## 2022-06-07 NOTE — ED Notes (Addendum)
RN attempted to call to update on disposition to [facility]. Agreeable with plan. [name],[name] Legal Guardian   [phone]

## 2022-06-08 ENCOUNTER — Other Ambulatory Visit (HOSPITAL_COMMUNITY): Payer: Self-pay

## 2022-06-08 DIAGNOSIS — R45851 Suicidal ideations: Secondary | ICD-10-CM | POA: Diagnosis not present

## 2022-06-08 MED ORDER — DOLUTEGRAVIR SODIUM 50 MG PO TABS
50.0000 mg | ORAL_TABLET | Freq: Every day | ORAL | Status: DC
  Administered 2022-06-09: 50 mg via ORAL
  Filled 2022-06-08 (×2): qty 1

## 2022-06-08 NOTE — BH IP Treatment Plan (Signed)
Interdisciplinary Treatment and Diagnostic Plan Update  06/08/2022 Time of Session: 0830 Crystal Black MRN: [id number]  Principal Diagnosis: Suicidal ideation  Secondary Diagnoses: Principal Problem:   Suicidal ideation Active Problems:   Post traumatic stress disorder (PTSD)   HIV disease (HCC)   Borderline personality disorder (HCC)   Current Medications:  Current Facility-Administered Medications  Medication Dose Route Frequency Provider Last Rate Last Admin   acetaminophen (TYLENOL) tablet 650 mg  650 mg Oral Q6H PRN Vanetta Mulders, NP       alum & mag hydroxide-simeth (MAALOX/MYLANTA) 200-200-20 MG/5ML suspension 30 mL  30 mL Oral Q4H PRN Gabriel Cirri F, NP       benztropine (COGENTIN) tablet 0.5 mg  0.5 mg Oral BID [name], John T, MD   0.5 mg at 06/08/22 0847   darunavir-cobicistat (PREZCOBIX) 800-150 MG per tablet 1 tablet  1 tablet Oral Q breakfast [name], John T, MD       divalproex (DEPAKOTE) DR tablet 500 mg  500 mg Oral Q12H [name], John T, MD   500 mg at 06/08/22 0847   hydrOXYzine (ATARAX) tablet 25 mg  25 mg Oral TID PRN Vanetta Mulders, NP       magnesium hydroxide (MILK OF MAGNESIA) suspension 30 mL  30 mL Oral Daily PRN Gabriel Cirri F, NP       nicotine polacrilex (NICORETTE) gum 2 mg  2 mg Oral PRN [name], [name], MD   2 mg at 06/07/22 2111   prazosin (MINIPRESS) capsule 5 mg  5 mg Oral QHS [name], John T, MD   5 mg at 06/07/22 2109   topiramate (TOPAMAX) tablet 50 mg  50 mg Oral BID [name], John T, MD   50 mg at 06/08/22 0848   PTA Medications: Medications Prior to Admission  Medication Sig Dispense Refill Last Dose   albuterol (VENTOLIN HFA) 108 (90 Base) MCG/ACT inhaler Inhale 2 puffs into the lungs every 4 (four) hours.      benztropine (COGENTIN) 1 MG tablet Take 1 mg by mouth 2 (two) times daily.      chlorhexidine (PERIDEX) 0.12 % solution Use as directed 15 mLs in the mouth or throat 2 (two) times daily. 120 mL 0    cyclobenzaprine  (FLEXERIL) 5 MG tablet 1 tablet every 8 hours as needed for muscle spasms 15 tablet 0    darunavir-cobicistat (PREZCOBIX) 800-150 MG tablet Take 1 tablet by mouth daily with breakfast. 30 tablet 6    divalproex (DEPAKOTE) 250 MG DR tablet Take 250 mg by mouth every morning.      divalproex (DEPAKOTE) 500 MG DR tablet Take 1 tablet by mouth at bedtime.       docusate sodium (COLACE) 100 MG capsule Take 100 mg by mouth 2 (two) times daily.      dolutegravir (TIVICAY) 50 MG tablet Take 1 tablet (50 mg total) by mouth daily. 30 tablet 6    etonogestrel (NEXPLANON) 68 MG IMPL implant 1 each (68 mg total) by Subdermal route once for 1 dose. 1 each 0    FLUoxetine (PROZAC) 20 MG capsule Take 1 capsule (20 mg total) by mouth daily for 14 days. 14 capsule 0    folic acid (FOLVITE) 1 MG tablet Take 1 mg by mouth daily.      hydrOXYzine (ATARAX/VISTARIL) 25 MG tablet Take 25 mg by mouth 3 (three) times daily as needed.      ibuprofen (ADVIL) 800 MG tablet Take 1 tablet (800 mg total) by  mouth every 8 (eight) hours as needed for moderate pain. 15 tablet 0    INVEGA SUSTENNA 156 MG/ML SUSY injection SMARTSIG:1 Syringe(s) IM Every 4 Weeks      meloxicam (MOBIC) 15 MG tablet Take 1 tablet (15 mg total) by mouth daily. 30 tablet 0    metFORMIN (GLUCOPHAGE-XR) 500 MG 24 hr tablet Take 1 tablet by mouth 2 (two) times daily.      methylPREDNISolone (MEDROL DOSEPAK) 4 MG TBPK tablet Take as directed 21 each 0    montelukast (SINGULAIR) 10 MG tablet Take 10 mg by mouth at bedtime.      paliperidone (INVEGA) 3 MG 24 hr tablet Take 3 mg by mouth daily.      prazosin (MINIPRESS) 1 MG capsule Take 1 capsule (1 mg total) by mouth at bedtime. (Patient taking differently: Take 5 mg by mouth at bedtime.) 14 capsule 0    topiramate (TOPAMAX) 50 MG tablet Take 50 mg by mouth 3 (three) times daily.      traZODone (DESYREL) 50 MG tablet Take 50 mg by mouth at bedtime as needed for sleep.       Patient Stressors: Financial  difficulties   Health problems   Medication change or noncompliance   Traumatic event    Patient Strengths: Ability for insight  Printmaker for treatment/growth  Physical Health   Treatment Modalities: Medication Management, Group therapy, Case management,  1 to 1 session with clinician, Psychoeducation, Recreational therapy.   Physician Treatment Plan for Primary Diagnosis: Suicidal ideation Long Term Goal(s): Improvement in symptoms so as ready for discharge   Short Term Goals: Compliance with prescribed medications will improve Ability to verbalize feelings will improve Ability to disclose and discuss suicidal ideas Ability to demonstrate self-control will improve  Medication Management: Evaluate patient's response, side effects, and tolerance of medication regimen.  Therapeutic Interventions: 1 to 1 sessions, Unit Group sessions and Medication administration.  Evaluation of Outcomes: Progressing  Physician Treatment Plan for Secondary Diagnosis: Principal Problem:   Suicidal ideation Active Problems:   Post traumatic stress disorder (PTSD)   HIV disease (HCC)   Borderline personality disorder (HCC)  Long Term Goal(s): Improvement in symptoms so as ready for discharge   Short Term Goals: Compliance with prescribed medications will improve Ability to verbalize feelings will improve Ability to disclose and discuss suicidal ideas Ability to demonstrate self-control will improve     Medication Management: Evaluate patient's response, side effects, and tolerance of medication regimen.  Therapeutic Interventions: 1 to 1 sessions, Unit Group sessions and Medication administration.  Evaluation of Outcomes: Progressing   RN Treatment Plan for Primary Diagnosis: Suicidal ideation Long Term Goal(s): Knowledge of disease and therapeutic regimen to maintain health will improve  Short Term Goals: Ability to remain free from injury will improve, Ability  to verbalize frustration and anger appropriately will improve, Ability to demonstrate self-control, Ability to participate in decision making will improve, Ability to verbalize feelings will improve, Ability to disclose and discuss suicidal ideas, Ability to identify and develop effective coping behaviors will improve, and Compliance with prescribed medications will improve  Medication Management: RN will administer medications as ordered by provider, will assess and evaluate patient's response and provide education to patient for prescribed medication. RN will report any adverse and/or side effects to prescribing provider.  Therapeutic Interventions: 1 on 1 counseling sessions, Psychoeducation, Medication administration, Evaluate responses to treatment, Monitor vital signs and CBGs as ordered, Perform/monitor CIWA, COWS, AIMS and Fall Risk screenings as  ordered, Perform wound care treatments as ordered.  Evaluation of Outcomes: Progressing   LCSW Treatment Plan for Primary Diagnosis: Suicidal ideation Long Term Goal(s): Safe transition to appropriate next level of care at discharge, Engage patient in therapeutic group addressing interpersonal concerns.  Short Term Goals: Engage patient in aftercare planning with referrals and resources, Increase social support, Increase ability to appropriately verbalize feelings, Increase emotional regulation, Facilitate acceptance of mental health diagnosis and concerns, Facilitate patient progression through stages of change regarding substance use diagnoses and concerns, Identify triggers associated with mental health/substance abuse issues, and Increase skills for wellness and recovery  Therapeutic Interventions: Assess for all discharge needs, 1 to 1 time with Social worker, Explore available resources and support systems, Assess for adequacy in community support network, Educate family and significant other(s) on suicide prevention, Complete Psychosocial  Assessment, Interpersonal group therapy.  Evaluation of Outcomes: Progressing   Progress in Treatment: Attending groups: Yes. Participating in groups: No. Taking medication as prescribed: Yes. Toleration medication: Yes. Family/Significant other contact made: No, will contact:  CSW will contact legal guardian. Patient understands diagnosis: No. Discussing patient identified problems/goals with staff: Yes. Medical problems stabilized or resolved: Yes. Denies suicidal/homicidal ideation: No. Issues/concerns per patient self-inventory: Yes. Other: none  New problem(s) identified: No, Describe:  none  New Short Term/Long Term Goal(s): Patient to work towards medication management for mood stabilization; elimination of SI thoughts; development of comprehensive mental wellness plan.  Patient Goals:  Patient states their goal for treatment is to "just get the help I need." Patient unable to identify what help is needed.  Discharge Plan or Barriers: No psychosocial barriers identified at this time, patient to return to place of residence when appropriate for discharge.   Reason for Continuation of Hospitalization: Depression Medication stabilization Suicidal ideation  Estimated Length of Stay: 1-7 days   Last 3 [place] Suicide Severity Risk Score: Flowsheet Row Admission (Current) from 06/07/2022 in [facility] INPATIENT BEH[facility] MEDICINE ED from 06/06/2022 in [facility] EMERGENCY [facility]PART[facility] ED from [facility]/31/2022 in [facility]  C-SSRS RISK CATEGORY High Risk High Risk No Risk       Last PHQ 2/9 Scores:    05/26/2022   12:03 PM 11/04/2021   11:52 AM  Depression screen PHQ 2/9  Decreased Interest 0 0  Down, Depressed, Hopeless 1 1  PHQ - 2 Score 1 1    Scribe for Treatment Team: Almedia Balls 06/08/2022 10:15 AM

## 2022-06-08 NOTE — Group Note (Signed)
[facility] LCSW Group Therapy Note   Group Date: 06/08/2022 Start Time: 1300 End Time: 1400   Type of Therapy/Topic:  Group Therapy:  Emotion Regulation  Participation Level:  Did Not Attend   Mood:  Description of Group:    The purpose of this group is to assist patients in learning to regulate negative emotions and experience positive emotions. Patients will be guided to discuss ways in which they have been vulnerable to their negative emotions. These vulnerabilities will be juxtaposed with experiences of positive emotions or situations, and patients challenged to use positive emotions to combat negative ones. Special emphasis will be placed on coping with negative emotions in conflict situations, and patients will process healthy conflict resolution skills.  Therapeutic Goals: Patient will identify two positive emotions or experiences to reflect on in order to balance out negative emotions:  Patient will label two or more emotions that they find the most difficult to experience:  Patient will be able to demonstrate positive conflict resolution skills through discussion or role plays:   Summary of Patient Progress:   Patient did not attend group.    Therapeutic Modalities:   Cognitive Behavioral Therapy Feelings Identification Dialectical Behavioral Therapy   Shellene Sweigert J Rozina Pointer, LCSW 
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
## 2022-06-08 NOTE — Progress Notes (Signed)
Patient was cooperative with treatment and medication on shift, she spent most of the evening resting in bed. She remains sad and depressed, she denies SI, HI & AVH.

## 2022-06-08 NOTE — [facility] Group Notes (Signed)
[facility] Group Notes:  (Nursing/MHT/Case Management/Adjunct)  Date:  06/08/2022  Time:  8:30 PM  Type of Therapy:   Wrap up  Participation Level:  Active  Participation Quality:  Appropriate  Affect:  Appropriate  Cognitive:  Alert  Insight:  Good  Engagement in Group:  Engaged and Her goal is to deal with flash back  Modes of Intervention:  Support  Summary of Progress/Problems:  Crystal Black 06/08/2022, 8:30 PM
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
## 2022-06-08 NOTE — Plan of Care (Signed)
  Problem: Education: Goal: Knowledge of General Education information will improve Description: Including pain rating scale, medication(s)/side effects and non-pharmacologic comfort measures Outcome: Progressing   Problem: Health Behavior/Discharge Planning: Goal: Ability to manage health-related needs will improve Outcome: Progressing   Problem: Nutrition: Goal: Adequate nutrition will be maintained Outcome: Progressing   Problem: Education: Goal: Emotional status will improve Outcome: Progressing   Problem: Education: Goal: Mental status will improve Outcome: Progressing

## 2022-06-08 NOTE — Plan of Care (Signed)
Patient stayed in bed most of the shift. States that she feels tired but feels better emotionally. Denies SI,HI and AVH.Appropriate with staff & peers. Compliant with medications. Appetite good. Support and encouragement given.

## 2022-06-08 NOTE — Progress Notes (Signed)
Recreation Therapy Notes   Date: 06/08/2022  Time: 10:35 am    Location: Craft room     Behavioral response: N/A   Intervention Topic: Values   Discussion/Intervention: Patient refused to attend group.   Clinical Observations/Feedback:  Patient refused to attend group.    Alleyah Twombly LRT/CTRS        Kayler Buckholtz 06/08/2022 12:15 PM
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
## 2022-06-09 MED ORDER — PRAZOSIN HCL 5 MG PO CAPS: 5.0000 mg | ORAL_CAPSULE | Freq: Every day | ORAL | 1 refills | Status: DC

## 2022-06-09 MED ORDER — TOPIRAMATE 50 MG PO TABS: 50.0000 mg | ORAL_TABLET | Freq: Two times a day (BID) | ORAL | 1 refills | Status: DC

## 2022-06-09 MED ORDER — DOLUTEGRAVIR SODIUM 50 MG PO TABS: 50.0000 mg | ORAL_TABLET | Freq: Every day | ORAL | 1 refills | Status: DC

## 2022-06-09 MED ORDER — HYDROXYZINE HCL 25 MG PO TABS: 25.0000 mg | ORAL_TABLET | Freq: Three times a day (TID) | ORAL | 1 refills | Status: DC | PRN

## 2022-06-09 MED ORDER — DARUNAVIR-COBICISTAT 800-150 MG PO TABS: 1.0000 | ORAL_TABLET | Freq: Every day | ORAL | 1 refills | Status: DC

## 2022-06-09 MED ORDER — NICOTINE POLACRILEX 2 MG MT GUM: 2.0000 mg | CHEWING_GUM | OROMUCOSAL | 1 refills | Status: DC | PRN

## 2022-06-09 MED ORDER — DIVALPROEX SODIUM 500 MG PO DR TAB: 500.0000 mg | DELAYED_RELEASE_TABLET | Freq: Two times a day (BID) | ORAL | 1 refills | Status: DC

## 2022-06-09 MED ORDER — BENZTROPINE MESYLATE 0.5 MG PO TABS: 0.5000 mg | ORAL_TABLET | Freq: Two times a day (BID) | ORAL | 1 refills | Status: DC

## 2022-06-09 NOTE — [facility] Suicide Risk Assessment (Signed)
[facility] Discharge Suicide Risk Assessment   Principal Problem: Suicidal ideation Discharge Diagnoses: Principal Problem:   Suicidal ideation Active Problems:   Post traumatic stress disorder (PTSD)   HIV disease (HCC)   Borderline personality disorder (HCC)   Total Time spent with patient: 30 minutes  Musculoskeletal: Strength & Muscle Tone: within normal limits Gait & Station: normal Patient leans: N/A  Psychiatric Specialty Exam  Presentation  General Appearance:  Bizarre  Eye Contact: Good  Speech: Clear and Coherent  Speech Volume: Normal  Handedness: Right   Mood and Affect  Mood: Depressed  Duration of Depression Symptoms: No data recorded Affect: Blunt; Congruent   Thought Process  Thought Processes: Coherent  Descriptions of Associations:Loose  Orientation:Full (Time, Place and Person)  Thought Content:Illogical; Obsessions  History of Schizophrenia/Schizoaffective disorder:No data recorded Duration of Psychotic Symptoms:No data recorded Hallucinations:No data recorded Ideas of Reference:None  Suicidal Thoughts:No data recorded Homicidal Thoughts:No data recorded  Sensorium  Memory: Immediate Good; Recent Good; Remote Good  Judgment: Fair  Insight: Fair   Chartered certified accountant: Fair  Attention Span: Fair  Recall: [employer] of Knowledge: Fair  Language: Fair   Psychomotor Activity  Psychomotor Activity:No data recorded  Assets  Assets: Communication Skills; Desire for Improvement; Resilience; Physical Health; Social Support   Sleep  Sleep:No data recorded  Physical Exam: Physical Exam Vitals and nursing note reviewed.  Constitutional:      Appearance: Normal appearance.  HENT:     Head: Normocephalic and atraumatic.     Mouth/Throat:     Pharynx: Oropharynx is clear.  Eyes:     Pupils: Pupils are equal, round, and reactive to light.  Cardiovascular:     Rate and Rhythm: Normal rate and  regular rhythm.  Pulmonary:     Effort: Pulmonary effort is normal.     Breath sounds: Normal breath sounds.  Abdominal:     General: Abdomen is flat.     Palpations: Abdomen is soft.  Musculoskeletal:        General: Normal range of motion.  Skin:    General: Skin is warm and dry.  Neurological:     General: No focal deficit present.     Mental Status: She is alert. Mental status is at baseline.  Psychiatric:        Attention and Perception: Attention normal.        Mood and Affect: Mood normal.        Speech: Speech normal.        Behavior: Behavior normal.        Thought Content: Thought content normal.        Cognition and Memory: Cognition normal.    Review of Systems  Constitutional: Negative.   HENT: Negative.    Eyes: Negative.   Respiratory: Negative.    Cardiovascular: Negative.   Gastrointestinal: Negative.   Musculoskeletal: Negative.   Skin: Negative.   Neurological: Negative.   Psychiatric/Behavioral: Negative.     Blood pressure 110/79, pulse (!) 103, temperature 97.7 F (36.5 C), temperature source Oral, resp. rate 18, height 5' (1.524 m), weight 78.9 kg, SpO2 100 %. Body mass index is 33.98 kg/m.  Mental Status Per Nursing Assessment::   On Admission:  NA  Demographic Factors:  Adolescent or young adult  Loss Factors: NA  Historical Factors: Impulsivity  Risk Reduction Factors:   Positive social support  Continued Clinical Symptoms:  Severe Anxiety and/or Agitation Dysthymia  Cognitive Features That Contribute To Risk:  None  Suicide Risk:  Minimal: No identifiable suicidal ideation.  Patients presenting with no risk factors but with morbid ruminations; may be classified as minimal risk based on the severity of the depressive symptoms   Follow-up Information     [facility] .   Contact information: [address] [place] [address] [phone]                 Plan Of Care/Follow-up recommendations:   Other:  Patient will be discharged back to her group home.  Recommend continued follow-up with [facility].  No change to medication.  Patient is denying all suicidal thought or violent or aggressive thought and has not displayed any dangerous behavior in the hospital.  Mordecai Rasmussen, MD 06/09/2022, 10:56 AM

## 2022-06-09 NOTE — Progress Notes (Signed)
  [facility] Adult Case Management Discharge Plan :  Will you be returning to the same living situation after discharge:  Yes,  pt to return to [facility] group home. At discharge, do you have transportation home?: Yes,  group home to provide transportation. Do you have the ability to pay for your medications: Yes,  LME Medicaid/[facility].  Release of information consent forms completed and in the chart;  Patient's signature needed at discharge.  Patient to Follow up at:  Follow-up Information     [facility] .   Why: You had an appointment scheduled for today. [facility] to follow up with group home to make arrangements regarding appointment. Thanks! Contact information: [address] [place] [address] [phone]                 Next level of care provider has access to [facility] Link:no  Safety Planning and Suicide Prevention discussed: Yes,  SPE completed with guardian, [name].     Has patient been referred to the Quitline?: Patient refused referral  Patient has been referred for addiction treatment: Pt. refused referral  Glenis Smoker, LCSW 06/09/2022, 1:29 PM

## 2022-06-09 NOTE — Plan of Care (Signed)
  Problem: Education: Goal: Knowledge of General Education information will improve Description: Including pain rating scale, medication(s)/side effects and non-pharmacologic comfort measures Outcome: Adequate for Discharge   Problem: Health Behavior/Discharge Planning: Goal: Ability to manage health-related needs will improve Outcome: Adequate for Discharge   Problem: Clinical Measurements: Goal: Ability to maintain clinical measurements within normal limits will improve Outcome: Adequate for Discharge Goal: Will remain free from infection Outcome: Adequate for Discharge Goal: Diagnostic test results will improve Outcome: Adequate for Discharge Goal: Respiratory complications will improve Outcome: Adequate for Discharge Goal: Cardiovascular complication will be avoided Outcome: Adequate for Discharge   Problem: Activity: Goal: Risk for activity intolerance will decrease Outcome: Adequate for Discharge   Problem: Nutrition: Goal: Adequate nutrition will be maintained Outcome: Adequate for Discharge   Problem: Coping: Goal: Level of anxiety will decrease Outcome: Adequate for Discharge   Problem: Elimination: Goal: Will not experience complications related to bowel motility Outcome: Adequate for Discharge Goal: Will not experience complications related to urinary retention Outcome: Adequate for Discharge   Problem: Pain Managment: Goal: General experience of comfort will improve Outcome: Adequate for Discharge   Problem: Safety: Goal: Ability to remain free from injury will improve Outcome: Adequate for Discharge   Problem: Skin Integrity: Goal: Risk for impaired skin integrity will decrease Outcome: Adequate for Discharge   Problem: Education: Goal: Knowledge of [facility] Education information/materials will improve Outcome: Adequate for Discharge Goal: Emotional status will improve Outcome: Adequate for Discharge Goal: Mental status will  improve Outcome: Adequate for Discharge Goal: Verbalization of understanding the information provided will improve Outcome: Adequate for Discharge   Problem: Activity: Goal: Sleeping patterns will improve Outcome: Adequate for Discharge   Problem: Coping: Goal: Ability to verbalize frustrations and anger appropriately will improve Outcome: Adequate for Discharge Goal: Ability to demonstrate self-control will improve Outcome: Adequate for Discharge   Problem: Health Behavior/Discharge Planning: Goal: Identification of resources available to assist in meeting health care needs will improve Outcome: Adequate for Discharge Goal: Compliance with treatment plan for underlying cause of condition will improve Outcome: Adequate for Discharge   Problem: Physical Regulation: Goal: Ability to maintain clinical measurements within normal limits will improve Outcome: Adequate for Discharge   Problem: Safety: Goal: Periods of time without injury will increase Outcome: Adequate for Discharge

## 2022-06-09 NOTE — Progress Notes (Signed)
Patient ID: Crystal Black, female   DOB: 02-27-1996, 26 y.o.   MRN: [id number] Patient denies SI/HI/AVH. Belongings were returned to patient. Discharge instructions  including medication and follow up information were reviewed with patient and understanding was verbalized. Patient was not observed to be in distress at time of discharge. Patient was escorted out with staff to medical mall to be transported to [facility].

## 2022-06-09 NOTE — Progress Notes (Signed)
Patient has been in the dayroom since the start of the shift.  She is playing cards with her peers. She is med compliant and states that she is feeling a lot better during this med  pass. She denies si/hi/avh depression and anxiety.  No issues or concerns to discuss, as she wanted to get back to her card game.  Safe on the unit with q15 min safety checks.   C Butler-Nicholson, LPN

## 2022-06-09 NOTE — Progress Notes (Signed)
Recreation Therapy Notes   Date: 06/09/2022  Time: 9:45 am     Location: Craft room   Behavioral response: Appropriate  Intervention Topic:  Coping skills   Discussion/Intervention:  Group content on today was focused on coping skills. The group defined what coping skills are and when they normally use coping skills. Individuals described how they normally cope with thing and the coping skills they normally use. Patients expressed why it is important to cope with things and how not coping with things can affect you. The group participated in the intervention "Exploring coping skills" where they had a chance to test new coping skills they could use in the future.  Clinical Observations/Feedback: Patient came to group late due to unknown reasons. She identified music and white noise as coping skills she uses. Individual was social with peers and staff while participating in the intervention.    Crystal Black LRT/CTRS         Crystal Black 06/09/2022 1:25 PM
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
## 2022-06-09 NOTE — BHH Counselor (Signed)
CSW contacted guardian, Charna Archer [phone]) to inform of discharge. Guardian was agreeable to this. CSW asked about aftercare as it was noted that pt has psychiatry for medication management but also needs a therapist. Wille Glaser stated that this was left up to the pt as she would have to buy into this. CSW voiced understanding and informed [name] that he would speak with pt about this. She asked to speak to the pt. CSW agreed, placing guardian on a brief hold to get pt. No other concerns expressed. Contact ended without incident. CSW transferred guardian to pt phone and they were able to speak with each other.   CSW met with pt briefly to discuss discharge. When asked about therapy, pt was agreeable to referral. Pt acknowledged that she has the clothes that she came into the hospital with in her stuff here at the hospital. Pt states that she is a cigarette smoker who has no interest in cessation. She denied any substance use or need for such services at this time, although she acknowledges in the past this was an issue for her. When asked if there was anything else that she needs for CSW she stated that she just wants her aftercare set up. CSW informed her that he would make contacts and attempt to get her an appointment set up for follow up. No other concerns expressed. Contact ended without incident.   CSW contacted group home, [facility] [phone]) to inform of discharge. [place] inquired regarding any medication changes. CSW informed her that he was uncertain but that he could only view her current medications and not those prior to admission. She asked that pt's scripts be sent to [facility] in [place], [place]. [place] agreed, stating that the provider would be updated. She agreed. Mc[place]ams and CSW discussed transportation for the pt. She stated that they have another client who has an appointment here at the hospital and they can swing by and pick the pt up as they  leave that appointment. Martha Clan stated that it will probably be around 1pm that they can pick pt up. CSW voiced understanding. She inquired regarding the process for discharge and was informed that they would need to check in at the medical mall information desk and left them know that they are here to pick up pt. CSW explained that the front desk will call the unit and notify us and in turn we would bring pt up. No other concerns expressed. Contact ended without incident.   CSW contacted [facility] [phone]) regarding follow up appointment. CSW inquired about pt getting therapy there. 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 CSW was also informed that pt had an appointment scheduled today for her shot. CSW explained that he was uncertain regarding when group home would be picking pt up exactly but that they had given a rough estimate of 1pm. She stated that she would reach out to group home regarding appointment to see when would be best for them. No other concerns expressed. Contact ended without incident.   Chalmers Guest. Guerry Bruin, MSW, LCSW, [place] 06/09/2022 1:28 PM
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
## 2022-06-16 ENCOUNTER — Other Ambulatory Visit (HOSPITAL_COMMUNITY): Payer: Self-pay

## 2022-07-07 ENCOUNTER — Other Ambulatory Visit: Payer: Self-pay

## 2022-07-07 ENCOUNTER — Emergency Department
Admission: EM | Admit: 2022-07-07 | Discharge: 2022-07-08 | Disposition: A | Discharge status: Home / Self Care | Payer: No Typology Code available for payment source | Attending: Emergency Medicine | Admitting: Emergency Medicine

## 2022-07-07 DIAGNOSIS — R45851 Suicidal ideations: Secondary | ICD-10-CM

## 2022-07-07 DIAGNOSIS — F17210 Nicotine dependence, cigarettes, uncomplicated: Secondary | ICD-10-CM | POA: Diagnosis present

## 2022-07-07 DIAGNOSIS — Z1152 Encounter for screening for COVID-19: Secondary | ICD-10-CM | POA: Insufficient documentation

## 2022-07-07 DIAGNOSIS — F061 Catatonic disorder due to known physiological condition: Secondary | ICD-10-CM | POA: Diagnosis present

## 2022-07-07 DIAGNOSIS — F1910 Other psychoactive substance abuse, uncomplicated: Secondary | ICD-10-CM | POA: Diagnosis present

## 2022-07-07 DIAGNOSIS — F250 Schizoaffective disorder, bipolar type: Secondary | ICD-10-CM | POA: Diagnosis not present

## 2022-07-07 DIAGNOSIS — F603 Borderline personality disorder: Secondary | ICD-10-CM | POA: Diagnosis present

## 2022-07-07 DIAGNOSIS — F329 Major depressive disorder, single episode, unspecified: Secondary | ICD-10-CM | POA: Insufficient documentation

## 2022-07-07 DIAGNOSIS — F331 Major depressive disorder, recurrent, moderate: Secondary | ICD-10-CM | POA: Diagnosis present

## 2022-07-07 DIAGNOSIS — F4310 Post-traumatic stress disorder, unspecified: Secondary | ICD-10-CM | POA: Diagnosis present

## 2022-07-07 DIAGNOSIS — F209 Schizophrenia, unspecified: Secondary | ICD-10-CM | POA: Diagnosis present

## 2022-07-07 LAB — CBC
HCT: 38.3 % (ref 36.0–46.0)
Hemoglobin: 12.6 g/dL (ref 12.0–15.0)
MCH: 31.6 pg (ref 26.0–34.0)
MCHC: 32.9 g/dL (ref 30.0–36.0)
MCV: 96 fL (ref 80.0–100.0)
Platelets: 233 10*3/uL (ref 150–400)
RBC: 3.99 MIL/uL (ref 3.87–5.11)
RDW: 13.2 % (ref 11.5–15.5)
WBC: 7 10*3/uL (ref 4.0–10.5)
nRBC: 0 % (ref 0.0–0.2)

## 2022-07-07 LAB — COMPREHENSIVE METABOLIC PANEL
ALT: 21 U/L (ref 0–44)
AST: 18 U/L (ref 15–41)
Albumin: 4.3 g/dL (ref 3.5–5.0)
Alkaline Phosphatase: 53 U/L (ref 38–126)
Anion gap: 8 (ref 5–15)
BUN: 18 mg/dL (ref 6–20)
CO2: 19 mmol/L — ABNORMAL LOW (ref 22–32)
Calcium: 8.8 mg/dL — ABNORMAL LOW (ref 8.9–10.3)
Chloride: 112 mmol/L — ABNORMAL HIGH (ref 98–111)
Creatinine, Ser: 0.95 mg/dL (ref 0.44–1.00)
GFR, Estimated: >60 mL/min (ref >60)
Glucose, Bld: 148 mg/dL — ABNORMAL HIGH (ref 70–99)
Potassium: 3.7 mmol/L (ref 3.5–5.1)
Sodium: 139 mmol/L (ref 135–145)
Total Bilirubin: 0.5 mg/dL (ref 0.3–1.2)
Total Protein: 8.1 g/dL (ref 6.5–8.1)

## 2022-07-07 LAB — ACETAMINOPHEN LEVEL: Acetaminophen (Tylenol), Serum: <10 ug/mL — ABNORMAL LOW (ref 10–30)

## 2022-07-07 LAB — ETHANOL: Alcohol, Ethyl (B): <10 mg/dL (ref <10)

## 2022-07-07 LAB — SALICYLATE LEVEL: Salicylate Lvl: <7 mg/dL — ABNORMAL LOW (ref 7.0–30.0)

## 2022-07-07 NOTE — ED Provider Notes (Signed)
[facility] Provider Note    Event Date/Time   First MD Initiated Contact with Patient 07/07/22 2154     (approximate)   History   Suicidal and IVC   HPI  Crystal Black is a 27 y.o. female   Past medical history of depression anxiety PTSD who presents the emergency department suicidal ideation.  She comes from a residential group home facility with suicidal and homicidal ideation with reported plan to suffocate herself or cut herself with a knife.  She was IVC prior to Arrival to the emergency department.   She has no acute medical complaints.  Multiple stressors, has been taking all of her medications and denies drug or alcohol use.  Suicidal ideation with plan as above.   History was obtained via the patient I reviewed external medical notes including a discharge summary from 06/09/2022 when she was admitted to psychiatric for agitation and aggression and threatening behavior at her group home, suicidal ideation.     Physical Exam   Triage Vital Signs: ED Triage Vitals  Enc Vitals Group     BP 07/07/22 2119 125/74     Pulse Rate 07/07/22 2119 98     Resp 07/07/22 2119 18     Temp 07/07/22 2119 98 F (36.7 C)     Temp src --      SpO2 07/07/22 2119 98 %     Weight 07/07/22 2120 179 lb (81.2 kg)     Height 07/07/22 2120 5' (1.524 m)     Head Circumference --      Peak Flow --      Pain Score 07/07/22 2119 0     Pain Loc --      Pain Edu? --      Excl. in [place]? --     Most recent vital signs: Vitals:   07/07/22 2119  BP: 125/74  Pulse: 98  Resp: 18  Temp: 98 F (36.7 C)  SpO2: 98%    General: Awake, no distress.  CV:  Good peripheral perfusion.  Resp:  Normal effort.  Abd:  No distention.  Other:  Awake alert comfortable appearing, no obvious trauma    ED Results / Procedures / Treatments   Labs (all labs ordered are listed, but only abnormal results are displayed) Labs Reviewed  COMPREHENSIVE METABOLIC PANEL - Abnormal;  Notable for the following components:      Result Value   Chloride 112 (*)    CO2 19 (*)    Glucose, Bld 148 (*)    Calcium 8.8 (*)    All other components within normal limits  SALICYLATE LEVEL - Abnormal; Notable for the following components:   Salicylate Lvl <2.4 (*)    All other components within normal limits  ACETAMINOPHEN LEVEL - Abnormal; Notable for the following components:   Acetaminophen (Tylenol), Serum <10 (*)    All other components within normal limits  ETHANOL  CBC  URINE DRUG SCREEN, QUALITATIVE ([facility] ONLY)  POC URINE PREG, ED     I reviewed labs and they are notable for negative salicylate and ethanol and Tylenol level.   PROCEDURES:  Critical Care performed: No  Procedures   MEDICATIONS ORDERED IN ED: Medications - No data to display  IMPRESSION / MDM / [place] / ED COURSE  I reviewed the triage vital signs and the nursing notes.  Differential diagnosis includes, but is not limited to, [place] ideation, depression, anxiety, considering organic causes like trauma infection or ingestion but less likely    MDM: Medically cleared for psychiatric evaluation after review of basic labs and tox labs within normal limits. IVC    Patient's presentation is most consistent with acute presentation with potential threat to life or bodily function.       FINAL CLINICAL IMPRESSION(S) / ED DIAGNOSES   Final diagnoses:  Suicidal ideation     Rx / DC Orders   ED Discharge Orders     None        Note:  This document was prepared using Dragon voice recognition software and may include unintentional dictation errors.    Lucillie Garfinkel, MD 07/07/22 [phone]
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
## 2022-07-07 NOTE — ED Notes (Signed)
Pt dressed out into hospital provided scrubs by this tech. Pts belongings placed in a labeled pt belonging bag. Pts belongings include:  Pearline Cables long sleeve top [employer] [place] bra Pink underwear White slide shoes Black winter coat Off-white hair scrunchie  Ziploc bag including: Red gatorade [place] pack of cigarettes Face mask Paper with pts phone numbers Pack of playing cards Lighter Two vape pens Snack pack of chex mix  Elastic from a yellow skid-free sock was cut and provided for the pt to use as a hair scrunchie, per pts request.

## 2022-07-07 NOTE — ED Triage Notes (Signed)
Arrives [facility] from [facility]. Pt sts she lives at [facility]. Was taken to crisis center by coworker after suicidal & homicidal ideation with plan to:   1) suffocate self 2) cut throat with knife 3) drown  Admits access to knives in her office. Upon arrival IVC papers were already petitioned and served.

## 2022-07-07 NOTE — ED Notes (Signed)
Behavioral med provided at the bedside for pt evaluation

## 2022-07-08 DIAGNOSIS — F603 Borderline personality disorder: Secondary | ICD-10-CM

## 2022-07-08 DIAGNOSIS — F4310 Post-traumatic stress disorder, unspecified: Secondary | ICD-10-CM

## 2022-07-08 LAB — RESP PANEL BY RT-PCR (RSV, FLU A&B, COVID)  RVPGX2
Influenza A by PCR: NEGATIVE
Influenza B by PCR: NEGATIVE
Resp Syncytial Virus by PCR: NEGATIVE
SARS Coronavirus 2 by RT PCR: NEGATIVE

## 2022-07-08 LAB — URINE DRUG SCREEN, QUALITATIVE (ARMC ONLY)
Amphetamines, Ur Screen: NOT DETECTED
Barbiturates, Ur Screen: NOT DETECTED
Benzodiazepine, Ur Scrn: NOT DETECTED
Cannabinoid 50 Ng, Ur ~~LOC~~: NOT DETECTED
Cocaine Metabolite,Ur ~~LOC~~: NOT DETECTED
MDMA (Ecstasy)Ur Screen: NOT DETECTED
Methadone Scn, Ur: NOT DETECTED
Opiate, Ur Screen: NOT DETECTED
Phencyclidine (PCP) Ur S: NOT DETECTED
Tricyclic, Ur Screen: NOT DETECTED

## 2022-07-08 LAB — POC URINE PREG, ED: Preg Test, Ur: NEGATIVE

## 2022-07-08 MED ORDER — NICOTINE 21 MG/24HR TD PT24
21.0000 mg | MEDICATED_PATCH | Freq: Once | TRANSDERMAL | Status: DC
  Administered 2022-07-08: 21 mg via TRANSDERMAL
  Filled 2022-07-08: qty 1

## 2022-07-08 NOTE — ED Notes (Signed)
Pt using the phone to speak with her group home director
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
## 2022-07-08 NOTE — ED Notes (Signed)
Snack was provided with water. No other needs at this time   

## 2022-07-08 NOTE — ED Notes (Signed)
Pt. To BHU from ED ambulatory without difficulty, to room  BHU 4. Report from [employer]. Pt. Is alert and oriented, warm and dry in no distress. Pt. Denies SI, HI, and AVH. Pt. Calm and cooperative. Pt. Made aware of security cameras and Q15 minute rounds. Pt. Encouraged to let Nursing staff know of any concerns or needs.   ENVIRONMENTAL ASSESSMENT Potentially harmful objects out of patient reach: y Personal belongings secured: Yes.   Patient dressed in hospital provided attire only: Yes.   Plastic bags out of patient reach: Yes.   Patient care equipment (cords, cables, call bells, lines, and drains) shortened, removed, or accounted for: Yes.   Equipment and supplies removed from bottom of stretcher: Yes.   Potentially toxic materials out of patient reach: Yes.   Sharps container removed or out of patient reach: Yes.

## 2022-07-08 NOTE — ED Notes (Signed)
Pt asking to use the phone but stating that she needed numbers out of her belongings. Pt informed we would not go into belongings to get phone but I could check her chart to see if the number she needed was ok it. Pt states that she wants to call her "baby daddy". "Baby Daddy" number is not on the chart.

## 2022-07-08 NOTE — ED Notes (Signed)
ivc/ patient would remain under observation overnight and be reassessed in the morning to determine if she meets the criteria for psychiatric inpatient admission or she could be discharge in the morning.

## 2022-11-08 ENCOUNTER — Inpatient Hospital Stay: Payer: Medicaid Other | Admitting: Infectious Diseases

## 2022-11-17 ENCOUNTER — Encounter: Payer: Self-pay | Admitting: Infectious Diseases

## 2022-11-17 ENCOUNTER — Other Ambulatory Visit: Admission: RE | Admit: 2022-11-17 | Payer: Medicaid Other | Source: Ambulatory Visit | Admitting: *Deleted

## 2022-11-17 ENCOUNTER — Ambulatory Visit: Payer: Medicaid Other | Attending: Infectious Diseases | Admitting: Infectious Diseases

## 2022-11-17 VITALS — BP 116/79 | HR 81 | Temp 97.8°F | Wt 178.0 lb

## 2022-11-17 DIAGNOSIS — F4310 Post-traumatic stress disorder, unspecified: Secondary | ICD-10-CM | POA: Diagnosis not present

## 2022-11-17 DIAGNOSIS — F419 Anxiety disorder, unspecified: Secondary | ICD-10-CM | POA: Insufficient documentation

## 2022-11-17 DIAGNOSIS — F603 Borderline personality disorder: Secondary | ICD-10-CM | POA: Insufficient documentation

## 2022-11-17 DIAGNOSIS — Z21 Asymptomatic human immunodeficiency virus [HIV] infection status: Secondary | ICD-10-CM | POA: Diagnosis present

## 2022-11-17 DIAGNOSIS — J45909 Unspecified asthma, uncomplicated: Secondary | ICD-10-CM | POA: Diagnosis not present

## 2022-11-17 DIAGNOSIS — I10 Essential (primary) hypertension: Secondary | ICD-10-CM | POA: Insufficient documentation

## 2022-11-17 DIAGNOSIS — F259 Schizoaffective disorder, unspecified: Secondary | ICD-10-CM | POA: Diagnosis not present

## 2022-11-17 DIAGNOSIS — Z79899 Other long term (current) drug therapy: Secondary | ICD-10-CM | POA: Diagnosis not present

## 2022-11-17 DIAGNOSIS — K219 Gastro-esophageal reflux disease without esophagitis: Secondary | ICD-10-CM | POA: Insufficient documentation

## 2022-11-17 DIAGNOSIS — B20 Human immunodeficiency virus [HIV] disease: Secondary | ICD-10-CM
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
## 2022-11-17 NOTE — Patient Instructions (Addendum)
You are here for follow up of HIV- you are c/o dizziness and falls- BP is low when you stand up- ( 86/61) . Drink gatorade and water -your psych meds could be doing this- you have a high level of Depakote- I spoke to Gerri and you need to see your psychiatrist and PCP asap If you have another episode of falling and dizziness need to go to the ED. Today will do labs to check your HIV status

## 2022-11-18 ENCOUNTER — Other Ambulatory Visit
Admission: RE | Admit: 2022-11-18 | Discharge: 2022-11-18 | Disposition: A | Discharge status: Home / Self Care | Payer: Medicaid Other | Source: Ambulatory Visit | Attending: Infectious Diseases | Admitting: Infectious Diseases

## 2022-11-18 DIAGNOSIS — B20 Human immunodeficiency virus [HIV] disease: Secondary | ICD-10-CM | POA: Insufficient documentation

## 2022-11-18 LAB — CBC WITH DIFFERENTIAL/PLATELET
Abs Immature Granulocytes: 0.01 10*3/uL (ref 0.00–0.07)
Basophils Absolute: 0 10*3/uL (ref 0.0–0.1)
Basophils Relative: 1 %
Eosinophils Absolute: 0 10*3/uL (ref 0.0–0.5)
Eosinophils Relative: 0 %
HCT: 42.2 % (ref 36.0–46.0)
Hemoglobin: 14 g/dL (ref 12.0–15.0)
Immature Granulocytes: 0 %
Lymphocytes Relative: 47 %
Lymphs Abs: 2.5 10*3/uL (ref 0.7–4.0)
MCH: 31.7 pg (ref 26.0–34.0)
MCHC: 33.2 g/dL (ref 30.0–36.0)
MCV: 95.7 fL (ref 80.0–100.0)
Monocytes Absolute: 0.4 10*3/uL (ref 0.1–1.0)
Monocytes Relative: 8 %
Neutro Abs: 2.3 10*3/uL (ref 1.7–7.7)
Neutrophils Relative %: 44 %
Platelets: 194 10*3/uL (ref 150–400)
RBC: 4.41 MIL/uL (ref 3.87–5.11)
RDW: 13.1 % (ref 11.5–15.5)
WBC: 5.4 10*3/uL (ref 4.0–10.5)
nRBC: 0 % (ref 0.0–0.2)

## 2022-11-18 LAB — COMPREHENSIVE METABOLIC PANEL
ALT: 17 U/L (ref 0–44)
AST: 17 U/L (ref 15–41)
Albumin: 4 g/dL (ref 3.5–5.0)
Alkaline Phosphatase: 44 U/L (ref 38–126)
Anion gap: 8 (ref 5–15)
BUN: 18 mg/dL (ref 6–20)
CO2: 20 mmol/L — ABNORMAL LOW (ref 22–32)
Calcium: 8.9 mg/dL (ref 8.9–10.3)
Chloride: 109 mmol/L (ref 98–111)
Creatinine, Ser: 1.05 mg/dL — ABNORMAL HIGH (ref 0.44–1.00)
GFR, Estimated: >60 mL/min (ref >60)
Glucose, Bld: 111 mg/dL — ABNORMAL HIGH (ref 70–99)
Potassium: 3.9 mmol/L (ref 3.5–5.1)
Sodium: 137 mmol/L (ref 135–145)
Total Bilirubin: 0.6 mg/dL (ref 0.3–1.2)
Total Protein: 7.6 g/dL (ref 6.5–8.1)

## 2022-11-19 LAB — T-HELPER CELLS CD4/CD8 %
% CD 4 Pos. Lymph.: 43.9 % (ref 30.8–58.5)
Absolute CD 4 Helper: 1098 /uL (ref 359–1519)
Basophils Absolute: 0 10*3/uL (ref 0.0–0.2)
Basos: 1 %
CD3+CD4+ Cells/CD3+CD8+ Cells Bld: 1.42 (ref 0.92–3.72)
CD3+CD8+ Cells # Bld: 773 /uL (ref 109–897)
CD3+CD8+ Cells NFr Bld: 30.9 % (ref 12.0–35.5)
EOS (ABSOLUTE): 0 10*3/uL (ref 0.0–0.4)
Eos: 0 %
Hematocrit: 38.9 % (ref 34.0–46.6)
Hemoglobin: 13 g/dL (ref 11.1–15.9)
Immature Grans (Abs): 0 10*3/uL (ref 0.0–0.1)
Immature Granulocytes: 0 %
Lymphocytes Absolute: 2.5 10*3/uL (ref 0.7–3.1)
Lymphs: 47 %
MCH: 32.3 pg (ref 26.6–33.0)
MCHC: 33.4 g/dL (ref 31.5–35.7)
MCV: 97 fL (ref 79–97)
Monocytes Absolute: 0.5 10*3/uL (ref 0.1–0.9)
Monocytes: 9 %
Neutrophils Absolute: 2.3 10*3/uL (ref 1.4–7.0)
Neutrophils: 43 %
Platelets: 183 10*3/uL (ref 150–450)
RBC: 4.02 x10E6/uL (ref 3.77–5.28)
RDW: 13.3 % (ref 11.7–15.4)
WBC: 5.3 10*3/uL (ref 3.4–10.8)

## 2022-11-19 LAB — RPR: RPR Ser Ql: NONREACTIVE

## 2022-11-19 LAB — HIV-1 RNA QUANT-NO REFLEX-BLD
HIV 1 RNA Quant: <20 copies/mL
LOG10 HIV-1 RNA: UNDETERMINED log10copy/mL

## 2022-11-23 ENCOUNTER — Telehealth: Payer: Self-pay

## 2022-11-23 LAB — QUANTIFERON-TB GOLD PLUS: QuantiFERON-TB Gold Plus: NEGATIVE

## 2022-11-23 LAB — QUANTIFERON-TB GOLD PLUS (RQFGPL)
QuantiFERON Mitogen Value: >10 IU/mL
QuantiFERON Nil Value: 0.06 IU/mL
QuantiFERON TB1 Ag Value: 0.07 IU/mL
QuantiFERON TB2 Ag Value: 0.06 IU/mL

## 2022-11-23 NOTE — Telephone Encounter (Signed)
Attempted to contact the group home to relay lab results. No answer and LVM to call back Collen Vincent Jonathon Resides, CMA

## 2022-11-23 NOTE — Telephone Encounter (Signed)
-----   Message from Lynn Ito, MD sent at 11/22/2022  1:54 PM EDT ----- Can you let her ( group home) know that her labs look good except cr of 1 indicating dehydration- Can you please check to see whether she saw her psychaitrist/PCP for the falls and low BP? thx ----- Message ----- From: Leory Plowman, Lab [facility] [place] Sent: 11/18/2022  10:59 AM EDT To: Lynn Ito, MD

## 2022-11-25 NOTE — Telephone Encounter (Signed)
Spoke with [name], relayed that patient's labs look good except that creatinine indicated dehydration. Encouraged her to keep Crystal Black hydrated.   Garnetta ended up switching Harly's PCP and her depakote dose has been adjusted. Her new PCP will be managing her psych meds as well. She recently saw this provider and will be going back soon for labs. [name] is unsure of the provider's name.   Sandie Ano, RN

## 2023-01-30 ENCOUNTER — Emergency Department
Admission: EM | Admit: 2023-01-30 | Discharge: 2023-02-01 | Disposition: A | Discharge status: Other Acute Inpatient Hospital | Payer: MEDICAID | Attending: Emergency Medicine | Admitting: Emergency Medicine

## 2023-01-30 ENCOUNTER — Encounter: Payer: Self-pay | Admitting: Emergency Medicine

## 2023-01-30 DIAGNOSIS — T5492XA Toxic effect of unspecified corrosive substance, intentional self-harm, initial encounter: Secondary | ICD-10-CM

## 2023-01-30 DIAGNOSIS — F322 Major depressive disorder, single episode, severe without psychotic features: Secondary | ICD-10-CM | POA: Diagnosis not present

## 2023-01-30 DIAGNOSIS — Z21 Asymptomatic human immunodeficiency virus [HIV] infection status: Secondary | ICD-10-CM | POA: Diagnosis not present

## 2023-01-30 DIAGNOSIS — J45909 Unspecified asthma, uncomplicated: Secondary | ICD-10-CM | POA: Insufficient documentation

## 2023-01-30 DIAGNOSIS — F1910 Other psychoactive substance abuse, uncomplicated: Secondary | ICD-10-CM | POA: Diagnosis not present

## 2023-01-30 DIAGNOSIS — T65892A Toxic effect of other specified substances, intentional self-harm, initial encounter: Secondary | ICD-10-CM | POA: Diagnosis present

## 2023-01-30 DIAGNOSIS — R45851 Suicidal ideations: Secondary | ICD-10-CM | POA: Diagnosis not present

## 2023-01-30 DIAGNOSIS — F17210 Nicotine dependence, cigarettes, uncomplicated: Secondary | ICD-10-CM | POA: Insufficient documentation

## 2023-01-30 DIAGNOSIS — F4310 Post-traumatic stress disorder, unspecified: Secondary | ICD-10-CM | POA: Diagnosis present

## 2023-01-30 DIAGNOSIS — B20 Human immunodeficiency virus [HIV] disease: Secondary | ICD-10-CM | POA: Diagnosis present

## 2023-01-30 DIAGNOSIS — F603 Borderline personality disorder: Secondary | ICD-10-CM | POA: Diagnosis present

## 2023-01-30 LAB — CBC
HCT: 34.2 % — ABNORMAL LOW (ref 36.0–46.0)
Hemoglobin: 12.1 g/dL (ref 12.0–15.0)
MCH: 32.8 pg (ref 26.0–34.0)
MCHC: 35.4 g/dL (ref 30.0–36.0)
MCV: 92.7 fL (ref 80.0–100.0)
Platelets: 228 10*3/uL (ref 150–400)
RBC: 3.69 MIL/uL — ABNORMAL LOW (ref 3.87–5.11)
RDW: 12.7 % (ref 11.5–15.5)
WBC: 7.2 10*3/uL (ref 4.0–10.5)
nRBC: 0 % (ref 0.0–0.2)

## 2023-01-30 LAB — URINE DRUG SCREEN, QUALITATIVE (ARMC ONLY)
Amphetamines, Ur Screen: NOT DETECTED
Barbiturates, Ur Screen: NOT DETECTED
Benzodiazepine, Ur Scrn: NOT DETECTED
Cannabinoid 50 Ng, Ur ~~LOC~~: NOT DETECTED
Cocaine Metabolite,Ur ~~LOC~~: NOT DETECTED
MDMA (Ecstasy)Ur Screen: NOT DETECTED
Methadone Scn, Ur: NOT DETECTED
Opiate, Ur Screen: NOT DETECTED
Phencyclidine (PCP) Ur S: NOT DETECTED
Tricyclic, Ur Screen: NOT DETECTED

## 2023-01-30 LAB — COMPREHENSIVE METABOLIC PANEL
ALT: 18 U/L (ref 0–44)
AST: 16 U/L (ref 15–41)
Albumin: 3.9 g/dL (ref 3.5–5.0)
Alkaline Phosphatase: 49 U/L (ref 38–126)
Anion gap: 8 (ref 5–15)
BUN: 19 mg/dL (ref 6–20)
CO2: 20 mmol/L — ABNORMAL LOW (ref 22–32)
Calcium: 8.8 mg/dL — ABNORMAL LOW (ref 8.9–10.3)
Chloride: 112 mmol/L — ABNORMAL HIGH (ref 98–111)
Creatinine, Ser: 1.1 mg/dL — ABNORMAL HIGH (ref 0.44–1.00)
GFR, Estimated: >60 mL/min (ref >60)
Glucose, Bld: 132 mg/dL — ABNORMAL HIGH (ref 70–99)
Potassium: 3.4 mmol/L — ABNORMAL LOW (ref 3.5–5.1)
Sodium: 140 mmol/L (ref 135–145)
Total Bilirubin: 0.3 mg/dL (ref 0.3–1.2)
Total Protein: 7.3 g/dL (ref 6.5–8.1)

## 2023-01-30 LAB — SALICYLATE LEVEL: Salicylate Lvl: <7 mg/dL — ABNORMAL LOW (ref 7.0–30.0)

## 2023-01-30 LAB — ETHANOL: Alcohol, Ethyl (B): <10 mg/dL (ref <10)

## 2023-01-30 LAB — ACETAMINOPHEN LEVEL: Acetaminophen (Tylenol), Serum: <10 ug/mL — ABNORMAL LOW (ref 10–30)

## 2023-01-30 LAB — POC URINE PREG, ED: Preg Test, Ur: NEGATIVE

## 2023-01-30 MED ORDER — SODIUM CHLORIDE 0.9 % IV BOLUS: 1000.0000 mL | Freq: Once | INTRAVENOUS | Status: DC

## 2023-01-30 MED ORDER — SODIUM CHLORIDE 0.9 % IV BOLUS
1000.0000 mL | Freq: Once | INTRAVENOUS | Status: AC
  Administered 2023-01-30: 1000 mL via INTRAVENOUS

## 2023-01-30 NOTE — ED Notes (Signed)
Pt reports drinking a gulp of fabulouso at 2030 tonight.  Pt reports her mother died yesterday on her [birth year].  Pt states she wanted to kill herself.  Pt denies other drug use or etoh use.  Pt is calm and cooperative.  Pt is from a group home.  Pt in hallway bed.
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
## 2023-01-30 NOTE — ED Notes (Signed)
Contents of bad 2 include Take time to make your soul happy bag - bright orange and muted pink Mcdonalds bag with fries Uno deluxe family card game Blck bag 3 packs of ciggerettes  2 boxes e cigs 4 e cigs out of boxes Inhaler 8 one dollar bills Red headphones La colors mood lip gloss 2 green pencils 1 gold pencil Red penci Striped pencil 1 black pencil Lens cleaner Grey eye glass case Black Durag Black marker Grey lighter Green lighter Green Oncologist 2 dollars and 76 cents in change Mp3 player

## 2023-01-30 NOTE — ED Triage Notes (Signed)
Pt arrived via [facility] from group home, [facility]" after getting in argument with staff and then ingested aprox. 5-10 ounces of Fabuloso Cleaner at [employer]. Pt c/o sore throat and bilateral eyes burning. No obvious redness or irritation noted to pts mucosal membranes. Pts airway clear and able to swallow without difficulty.     Poison Control contacted by Clinical research associate with following recommendations from Danielle: GI irritant, look for GI symptoms and treat with antiemetics, fluids until able to tolerate PO fluid/solids. Baseline lab work with salicylate and acetaminophen with redraw of acetaminophen at 2 hours (0020.) EKG also recommended for baseline. Medical observation time 4 hours from time of ingestion.   ** PT HAS LEGAL GUARDIAN: Crystal Black [phone]

## 2023-01-30 NOTE — ED Notes (Signed)
Contents of bag 1include: Black nike slides Wallace Cullens sport bra White nike shirt with black logo Pink panties Red/black/white pattered leggings

## 2023-01-30 NOTE — ED Notes (Signed)
Md with pt now

## 2023-01-30 NOTE — ED Notes (Signed)
Pt in interview room with tts.  Iv in place.
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
## 2023-01-30 NOTE — ED Notes (Addendum)
This rn spoke with chris stockwell-[phone], on call legal guardian of pt.

## 2023-01-31 ENCOUNTER — Other Ambulatory Visit: Payer: Self-pay

## 2023-01-31 LAB — SALICYLATE LEVEL: Salicylate Lvl: <7 mg/dL — ABNORMAL LOW (ref 7.0–30.0)

## 2023-01-31 LAB — ACETAMINOPHEN LEVEL: Acetaminophen (Tylenol), Serum: <10 ug/mL — ABNORMAL LOW (ref 10–30)

## 2023-01-31 MED ORDER — NICOTINE POLACRILEX 2 MG MT GUM
2.0000 mg | CHEWING_GUM | OROMUCOSAL | Status: DC | PRN
  Administered 2023-01-31 (×2): 2 mg via ORAL
  Filled 2023-01-31 (×3): qty 1

## 2023-01-31 MED ORDER — BIKTARVY 50-200-25 MG PO TABS
1.0000 | ORAL_TABLET | Freq: Every day | ORAL | 0 refills | Status: DC
  Filled 2023-01-31: qty 30, 30d supply, fill #0

## 2023-01-31 NOTE — ED Notes (Addendum)
Breakfast meal tray given at this time.  

## 2023-01-31 NOTE — ED Provider Notes (Signed)
Emergency Medicine Observation Re-evaluation Note  Crystal Black is a 27 y.o. female, seen on rounds today.  Pt initially presented to the ED for complaints of Ingestion  Currently, the patient is resting comfortably.  Physical Exam  BP 134/83 (BP Location: Left Arm)   Pulse 96   Temp 98.7 F (37.1 C) (Oral)   Resp 18   Ht 5' (1.524 m)   Wt 81 kg   SpO2 96%   BMI 34.88 kg/m  General: No acute distress Cardiac: Well-perfused extremities Lungs: No respiratory distress Psych: Appropriate mood and affect  ED Course / MDM  EKG:EKG Interpretation Date/Time:  Monday January 30 2023 21:38:06 EDT Ventricular Rate:  88 PR Interval:  152 QRS Duration:  82 QT Interval:  362 QTC Calculation: 438 R Axis:   91  Text Interpretation: Normal sinus rhythm Rightward axis Borderline ECG When compared with ECG of 14-Apr-2020 13:56, No significant change was found Confirmed by UNCONFIRMED, DOCTOR ([id number]), editor Lonell Face [phone]) on 01/31/2023 7:25:39 AM  I have reviewed the labs performed to date as well as medications administered while in observation.  Recent changes in the last 24 hours include none.  Plan  Current plan is for placement.   Merwyn Katos, MD 01/31/23 [phone]

## 2023-01-31 NOTE — ED Notes (Signed)
Lunch meal tray placed at bedside at this time, pt currently sleeping.

## 2023-01-31 NOTE — ED Notes (Addendum)
Report received from Chad, RN.

## 2023-01-31 NOTE — ED Notes (Signed)
Pt given snack and beverage. 

## 2023-01-31 NOTE — ED Notes (Signed)
Pt care taken, no complaints, lying in hall wants something to drink. Gave a gingerale.

## 2023-01-31 NOTE — ED Notes (Signed)
Pt heard yelling at other psych pt down the hall. EDT intervened and talked to this pt one-on-one informing her that yelling at other pts in the hallway was not allowed. Pt explained she was yelling at the other pt because "she was being disrespectful to y'all." EDT further explained how it is kind of the pt to recognize when someone is not being treated right, but yelling and disrupting other pts in the hallway is also considered disrespectful. Pt understanding and apologetic. Privacy screen set in place for pts privacy from the pt down the hall. Snack trash disposed of and pt provided playing cards for entertainment.

## 2023-01-31 NOTE — ED Notes (Signed)
Pt provided with hospital snack tray and fresh ice water per request. Pt also asked for grape juice. Pt made aware of the rules of only water after night time snack until breakfast in the morning. Pt denied needing anything else at this time.

## 2023-01-31 NOTE — ED Notes (Signed)
Poison controlled called, provided update and closed this case. No further recommendation.

## 2023-01-31 NOTE — BH Assessment (Signed)
Per [facility] AC Selena Batten B), patient to be referred out of system.  Referral information for Psychiatric Hospitalization faxed to;   [facility] [phone]- [phone]) No available beds  [facility] [phone]),   [facility] [phone]),  [address] [phone]),   [facility] [phone] -or- [phone]),   [facility] [phone])  [facility] [phone])

## 2023-01-31 NOTE — BH Assessment (Signed)
Patient has been accepted to [facility] on tomm morning 02/01/23 after 8:30am. Patient assigned to [facility]. Accepting physician is Dr. Janalyn Rouse.  Call report to [phone].  Representative was [place].   ER Staff is aware of it:  Misty Stanley, ER Secretary  Dr. Anner Crete, ER MD  Morrie Sheldon, Patient's Nurse     Patient's Family/Support System [name]- legal guardian [phone]) has been updated as well.

## 2023-01-31 NOTE — BH Assessment (Addendum)
Patient is under review at [facility]. Writer called [phone] and left message to return call. Trying to confirm admission and give update on medication status.

## 2023-01-31 NOTE — ED Notes (Signed)
VOL  GOING TO  [facility] ON 02/01/23

## 2023-01-31 NOTE — ED Notes (Signed)
This tech obtained vital signs on pt.  

## 2023-02-01 DIAGNOSIS — F322 Major depressive disorder, single episode, severe without psychotic features: Secondary | ICD-10-CM

## 2023-02-01 NOTE — ED Notes (Signed)
Meal tray delivered. Pt sitting up eating at this time.

## 2023-02-01 NOTE — ED Notes (Signed)
Gave report to [facility] ,they are requesting permission sign by patients legal guardian , awaiting fax from [facility]

## 2023-02-01 NOTE — ED Notes (Signed)
Attempted to call [facility] to inform of patient's departure from our facility with no answer.

## 2023-02-01 NOTE — ED Notes (Signed)
Pt resting at this time. Chest rise and fall noted
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
## 2023-02-01 NOTE — ED Notes (Signed)
Called [name] (legal guardian) at [phone]. Legal guardian verbally acknowledge SAFE transport to [facility] and agreeable to plan.

## 2023-02-01 NOTE — ED Notes (Signed)
vol/accepted to [facility] on 02/01/23.

## 2023-02-01 NOTE — ED Notes (Signed)
Safe transport arrived to transport patient. Pt stable at time of departure. Pt's belongings as well as medication sent with safe transport.

## 2023-02-01 NOTE — ED Notes (Signed)
Safe  transport  called  to  take  pt  to  [facility]

## 2023-02-01 NOTE — ED Provider Notes (Signed)
Emergency Medicine Observation Re-evaluation Note  Crystal Black is a 27 y.o. female, seen on rounds today.  Pt initially presented to the ED for complaints of Ingestion Currently, the patient is resting, voices no medical complaints.  Physical Exam  BP 111/81 (BP Location: Left Arm)   Pulse 83   Temp 98.3 F (36.8 C)   Resp 20   Ht 5' (1.524 m)   Wt 81 kg   SpO2 97%   BMI 34.88 kg/m  Physical Exam General: Resting in no acute distress Cardiac: No cyanosis Lungs: Equal rise and fall Psych: Not agitated  ED Course / MDM  EKG:EKG Interpretation Date/Time:  Monday January 30 2023 21:38:06 EDT Ventricular Rate:  88 PR Interval:  152 QRS Duration:  82 QT Interval:  362 QTC Calculation: 438 R Axis:   91  Text Interpretation: Normal sinus rhythm Rightward axis Borderline ECG When compared with ECG of 14-Apr-2020 13:56, No significant change was found Confirmed by UNCONFIRMED, DOCTOR ([id number]), editor Lonell Face [phone]) on 01/31/2023 7:25:39 AM  I have reviewed the labs performed to date as well as medications administered while in observation.  Recent changes in the last 24 hours include no events overnight.  Plan  Current plan is for psychiatric disposition.    Irean Hong, MD 02/01/23 [phone]

## 2023-02-13 ENCOUNTER — Telehealth: Payer: Self-pay

## 2023-02-13 NOTE — Telephone Encounter (Signed)
Received voicemail from patient's group home. Crystal Black was hospitalized and did not receive her Prezcobix while there. Per chart review, she was given Biktarvy while at [facility]. Unable to determine what her regimen was while she was at [facility] in [place]. Group home staff is unsure of what regimen she was on, but thinks it contained Descovy. They would like to know if Crystal Black needs to be seen for follow-up sooner. Will route to provider.   Sandie Ano, RN

## 2023-02-13 NOTE — Telephone Encounter (Signed)
Spoke with group home, they will bring Faviola to appointment on Thursday. Requested that they bring all of her medications, including ones not being prescribed by Dr. Rivka Safer.   Sandie Ano, RN

## 2023-02-16 ENCOUNTER — Other Ambulatory Visit
Admission: RE | Admit: 2023-02-16 | Discharge: 2023-02-16 | Disposition: A | Discharge status: Home / Self Care | Payer: MEDICAID | Source: Ambulatory Visit | Attending: Infectious Diseases | Admitting: Infectious Diseases

## 2023-02-16 ENCOUNTER — Ambulatory Visit: Payer: MEDICAID | Attending: Infectious Diseases | Admitting: Infectious Diseases

## 2023-02-16 VITALS — BP 89/66 | HR 78 | Temp 97.3°F | Ht 62.0 in | Wt 177.0 lb

## 2023-02-16 DIAGNOSIS — J45909 Unspecified asthma, uncomplicated: Secondary | ICD-10-CM | POA: Insufficient documentation

## 2023-02-16 DIAGNOSIS — F259 Schizoaffective disorder, unspecified: Secondary | ICD-10-CM | POA: Insufficient documentation

## 2023-02-16 DIAGNOSIS — K219 Gastro-esophageal reflux disease without esophagitis: Secondary | ICD-10-CM | POA: Diagnosis not present

## 2023-02-16 DIAGNOSIS — F603 Borderline personality disorder: Secondary | ICD-10-CM | POA: Diagnosis not present

## 2023-02-16 DIAGNOSIS — Z79899 Other long term (current) drug therapy: Secondary | ICD-10-CM | POA: Diagnosis not present

## 2023-02-16 DIAGNOSIS — B20 Human immunodeficiency virus [HIV] disease: Secondary | ICD-10-CM | POA: Diagnosis present

## 2023-02-16 DIAGNOSIS — F4310 Post-traumatic stress disorder, unspecified: Secondary | ICD-10-CM | POA: Insufficient documentation

## 2023-02-16 DIAGNOSIS — T7421XS Adult sexual abuse, confirmed, sequela: Secondary | ICD-10-CM | POA: Diagnosis not present

## 2023-02-16 DIAGNOSIS — I10 Essential (primary) hypertension: Secondary | ICD-10-CM | POA: Diagnosis not present

## 2023-02-16 DIAGNOSIS — F17290 Nicotine dependence, other tobacco product, uncomplicated: Secondary | ICD-10-CM | POA: Diagnosis not present
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
## 2023-02-27 NOTE — ED Provider Notes (Signed)
[facility] Provider Note    Event Date/Time   First MD Initiated Contact with Patient 01/30/23 2143     (approximate)   History   Ingestion   HPI  Crystal Black is a 27 y.o. female history of ADHD   Patient presents after she ingested "fabulouso" cleaning product after she got upset at her group home.  She advises that her throat felt slightly sore but no difficulty breathing.  She did this because she was upset.  She reports she ingested a small amount like a sip  She endorses suicidal ideation, and does not wish to return to her current care facility  Denies any other injury or ingestion   No nausea or vomiting.  No diarrhea no abdominal pain     Physical Exam   Triage Vital Signs: ED Triage Vitals  Encounter Vitals Group     BP 01/30/23 2115 134/83     Systolic BP Percentile --      Diastolic BP Percentile --      Pulse Rate 01/30/23 2115 96     Resp 01/30/23 2115 18     Temp 01/30/23 2115 98.7 F (37.1 C)     Temp Source 01/30/23 2115 Oral     SpO2 01/30/23 2115 96 %     Weight 01/30/23 2056 178 lb 9.2 oz (81 kg)     Height 01/30/23 2056 5' (1.524 m)     Head Circumference --      Peak Flow --      Pain Score 01/30/23 2055 7     Pain Loc --      Pain Education --      Exclude from Growth Chart --     Most recent vital signs: Vitals:   02/01/23 1850 02/01/23 1850  BP: 115/74 115/74  Pulse: 89 89  Resp: 18 18  Temp: 98 F (36.7 C) 98 F (36.7 C)  SpO2: 98% 98%     General: Awake, no distress.  No noted lesions in the oropharynx or tongue normal mucosa.  Patient swallowing without difficulty no stridor no neck swelling CV:  Good peripheral perfusion.  Resp:  Normal effort.  Abd:  No distention.  Soft nontender nondistended Other:     ED Results / Procedures / Treatments   Labs (all labs ordered are listed, but only abnormal results are displayed) Labs Reviewed  COMPREHENSIVE METABOLIC PANEL - Abnormal; Notable  for the following components:      Result Value   Potassium 3.4 (*)    Chloride 112 (*)    CO2 20 (*)    Glucose, Bld 132 (*)    Creatinine, Ser 1.10 (*)    Calcium 8.8 (*)    All other components within normal limits  SALICYLATE LEVEL - Abnormal; Notable for the following components:   Salicylate Lvl <7.0 (*)    All other components within normal limits  ACETAMINOPHEN LEVEL - Abnormal; Notable for the following components:   Acetaminophen (Tylenol), Serum <10 (*)    All other components within normal limits  CBC - Abnormal; Notable for the following components:   RBC 3.69 (*)    HCT 34.2 (*)    All other components within normal limits  ACETAMINOPHEN LEVEL - Abnormal; Notable for the following components:   Acetaminophen (Tylenol), Serum <10 (*)    All other components within normal limits  SALICYLATE LEVEL - Abnormal; Notable for the following components:   Salicylate Lvl <7.0 (*)  All other components within normal limits  ETHANOL  URINE DRUG SCREEN, QUALITATIVE ([facility] ONLY)  POC URINE PREG, ED   Labs interpreted as mild hypokalemia, normal salicylate and acetaminophen levels including on repeat testing  PROCEDURES:  Critical Care performed: No  Procedures   MEDICATIONS ORDERED IN ED: Medications  sodium chloride 0.9 % bolus 1,000 mL (0 mLs Intravenous Stopped 01/31/23 0046)     IMPRESSION / MDM / ASSESSMENT AND PLAN / ED COURSE  I reviewed the triage vital signs and the nursing notes.                              Differential diagnosis includes, but is not limited to, self-injurious attempt, suicidal ideation, cleaning agent ingestion etc.  Consulted with poison control who recommended patient be observed for 4 hours for symptoms and repeat salicylate and acetaminophen levels.  Patient awake alert nontoxic no obvious evidence of airway or oral pharyngeal mucosal injury from her ingestion no GI symptoms  Patient will continue to be observed, ongoing care to  oncoming partner.  Patient is pending psychiatric evaluation  Patient's presentation is most consistent with acute complicated illness / injury requiring diagnostic workup.      Clinical Course as of 02/27/23 0157  Tue Jan 31, 2023  1308 Pt is medically cleared for psychiatric admission at this time [EB]    Clinical Course User Index [EB] Vicente Males Clent Jacks, MD     FINAL CLINICAL IMPRESSION(S) / ED DIAGNOSES   Final diagnoses:  Ingestion of corrosive chemical, intentional self-harm, initial encounter [facility])    Note:  This document was prepared using Dragon voice recognition software and may include unintentional dictation errors.   Sharyn Creamer, MD 02/27/23 0200

## 2023-03-03 NOTE — Progress Notes (Deleted)
PCP:  Alease Medina, MD   No chief complaint on file.    HPI:      [name] is a 27 y.o. No obstetric history on file. whose LMP was No LMP recorded. Patient has had an implant., presents today for her annual examination.  Her menses are {norm/abn:715}, lasting {number: 22536} days.  Dysmenorrhea {dysmen:716}. She {does:18564} have intermenstrual bleeding.  Sex activity: {sex active: 315163}. Nexplanon replaced 07/20/20 Last Pap: {QION:[id number]}  Results were: {norm/abn:16707::"no abnormalities"} /neg HPV DNA *** Hx of STDs: HIV--seeing ID  There is no FH of breast cancer. There is no FH of ovarian cancer. The patient {does:18564} do self-breast exams.  Tobacco use: {tob:20664} Alcohol use: {Alcohol:11675} No drug use.  Exercise: {exercise:31265}  She {does:18564} get adequate calcium and Vitamin D in her diet.  Patient Active Problem List   Diagnosis Date Noted   MDD (major depressive disorder), severe (HCC) 01/30/2023   HIV disease (HCC) 02/21/2019   Cigarette nicotine dependence without complication 07/26/2018   Polysubstance abuse (HCC) 04/18/2018   Borderline personality disorder (HCC) 07/18/2016   PTSD (post-traumatic stress disorder) 11/21/2013    Past Surgical History:  Procedure Laterality Date   COLONOSCOPY     COLONOSCOPY WITH PROPOFOL N/A 05/29/2019   Procedure: COLONOSCOPY WITH PROPOFOL;  Surgeon: Toledo, Boykin Nearing, MD;  Location: [facility] ENDOSCOPY;  Service: Gastroenterology;  Laterality: N/A;    Family History  Problem Relation Age of Onset   Drug abuse Mother     Social History   Socioeconomic History   Marital status: Single    Spouse name: Not on file   Number of children: Not on file   Years of education: Not on file   Highest education level: Not on file  Occupational History   Not on file  Tobacco Use   Smoking status: Every Day    Current packs/day: 0.25    Average packs/day: 0.3 packs/day for 10.0 years (2.5 ttl pk-yrs)     Types: Cigarettes, E-cigarettes    Passive exposure: Never   Smokeless tobacco: Never  Vaping Use   Vaping status: Every Day  Substance and Sexual Activity   Alcohol use: Not Currently   Drug use: No   Sexual activity: Not on file  Other Topics Concern   Not on file  Social History Narrative   Not on file   Social Determinants of Health   Financial Resource Strain: Not on file  Food Insecurity: No Food Insecurity (06/07/2022)   Hunger Vital Sign    Worried About Running Out of Food in the Last Year: Never true    Ran Out of Food in the Last Year: Never true  Transportation Needs: Unknown (06/07/2022)   PRAPARE - Administrator, Civil Service (Medical): Not on file    Lack of Transportation (Non-Medical): No  Physical Activity: Not on file  Stress: Not on file  Social Connections: Not on file  Intimate Partner Violence: Not At Risk (06/07/2022)   Humiliation, Afraid, Rape, and Kick questionnaire    Fear of Current or Ex-Partner: No    Emotionally Abused: No    Physically Abused: No    Sexually Abused: No     Current Outpatient Medications:    albuterol (VENTOLIN HFA) 108 (90 Base) MCG/ACT inhaler, Inhale 2 puffs into the lungs every 4 (four) hours as needed for wheezing or shortness of breath., Disp: , Rfl:    benztropine (COGENTIN) 0.5 MG tablet, Take 1 tablet (0.5 mg total)  by mouth 2 (two) times daily., Disp: 60 tablet, Rfl: 1   darunavir-cobicistat (PREZCOBIX) 800-150 MG tablet, Take 1 tablet by mouth daily with breakfast. Swallow whole. Do NOT crush, break or chew tablets. Take with food., Disp: 30 tablet, Rfl: 1   divalproex (DEPAKOTE) 500 MG DR tablet, Take 1 tablet (500 mg total) by mouth every 12 (twelve) hours. (Patient taking differently: Take 500 mg by mouth daily.), Disp: 60 tablet, Rfl: 1   docusate sodium (COLACE) 100 MG capsule, Take 300 mg by mouth daily., Disp: , Rfl:    dolutegravir (TIVICAY) 50 MG tablet, Take 1 tablet (50 mg total) by mouth  daily., Disp: 30 tablet, Rfl: 1   EPINEPHrine 0.3 mg/0.3 mL IJ SOAJ injection, Inject 0.3 mg into the muscle as needed for anaphylaxis., Disp: , Rfl:    etonogestrel (NEXPLANON) 68 MG IMPL implant, 1 each (68 mg total) by Subdermal route once for 1 dose., Disp: 1 each, Rfl: 0   famotidine (PEPCID) 20 MG tablet, Take 20 mg by mouth daily., Disp: , Rfl:    FLUoxetine (PROZAC) 40 MG capsule, Take 40 mg by mouth at bedtime., Disp: , Rfl:    glycerin adult 2 g suppository, Place 1 suppository rectally as needed for constipation., Disp: , Rfl:    hydrOXYzine (ATARAX) 25 MG tablet, Take 1 tablet (25 mg total) by mouth 3 (three) times daily as needed., Disp: 60 tablet, Rfl: 1   ibuprofen (ADVIL) 800 MG tablet, Take 1 tablet (800 mg total) by mouth every 8 (eight) hours as needed for moderate pain., Disp: 15 tablet, Rfl: 0   INVEGA SUSTENNA 156 MG/ML SUSY injection, Inject 234 mg into the muscle every 30 (thirty) days., Disp: , Rfl:    linaclotide (LINZESS) 290 MCG CAPS capsule, Take 290 mcg by mouth daily before breakfast., Disp: , Rfl:    magnesium citrate SOLN, Take 1 Bottle by mouth once., Disp: , Rfl:    methocarbamol (ROBAXIN) 500 MG tablet, Take 500 mg by mouth daily as needed for muscle spasms., Disp: , Rfl:    montelukast (SINGULAIR) 10 MG tablet, Take 10 mg by mouth at bedtime., Disp: , Rfl:    prazosin (MINIPRESS) 5 MG capsule, Take 1 capsule (5 mg total) by mouth at bedtime., Disp: 30 capsule, Rfl: 1   topiramate (TOPAMAX) 50 MG tablet, Take 1 tablet (50 mg total) by mouth 2 (two) times daily. (Patient taking differently: Take 50 mg by mouth daily. bedtime), Disp: 60 tablet, Rfl: 1   traZODone (DESYREL) 50 MG tablet, Take 50 mg by mouth at bedtime., Disp: , Rfl:    triamcinolone cream (KENALOG) 0.1 %, Apply 1 Application topically 2 (two) times daily. Right foot, Disp: , Rfl:      ROS:  Review of Systems BREAST: No symptoms   Objective: There were no vitals taken for this  visit.   OBGyn Exam  Results: No results found for this or any previous visit (from the past 24 hour(s)).  Assessment/Plan: No diagnosis found.  No orders of the defined types were placed in this encounter.            GYN counsel {counseling: 16159}     F/U  No follow-ups on file.  [place] Shannahan B. Lailanie Hasley, [name] 03/03/2023 3:01 PM

## 2023-03-05 ENCOUNTER — Emergency Department (HOSPITAL_COMMUNITY)
Admission: EM | Admit: 2023-03-05 | Discharge: 2023-03-07 | Disposition: A | Discharge status: Home / Self Care | Payer: MEDICAID | Source: Home / Self Care | Attending: Emergency Medicine | Admitting: Emergency Medicine

## 2023-03-05 ENCOUNTER — Other Ambulatory Visit: Payer: Self-pay

## 2023-03-05 DIAGNOSIS — R45851 Suicidal ideations: Secondary | ICD-10-CM | POA: Insufficient documentation

## 2023-03-05 DIAGNOSIS — Z21 Asymptomatic human immunodeficiency virus [HIV] infection status: Secondary | ICD-10-CM | POA: Insufficient documentation

## 2023-03-05 DIAGNOSIS — F17210 Nicotine dependence, cigarettes, uncomplicated: Secondary | ICD-10-CM | POA: Insufficient documentation

## 2023-03-05 DIAGNOSIS — J45909 Unspecified asthma, uncomplicated: Secondary | ICD-10-CM | POA: Insufficient documentation

## 2023-03-05 DIAGNOSIS — F332 Major depressive disorder, recurrent severe without psychotic features: Secondary | ICD-10-CM | POA: Insufficient documentation

## 2023-03-05 LAB — URINE DRUG SCREEN, QUALITATIVE (ARMC ONLY)
Amphetamines, Ur Screen: NOT DETECTED
Barbiturates, Ur Screen: NOT DETECTED
Benzodiazepine, Ur Scrn: NOT DETECTED
Cannabinoid 50 Ng, Ur ~~LOC~~: NOT DETECTED
Cocaine Metabolite,Ur ~~LOC~~: NOT DETECTED
MDMA (Ecstasy)Ur Screen: NOT DETECTED
Methadone Scn, Ur: NOT DETECTED
Opiate, Ur Screen: NOT DETECTED
Phencyclidine (PCP) Ur S: NOT DETECTED
Tricyclic, Ur Screen: NOT DETECTED

## 2023-03-05 LAB — COMPREHENSIVE METABOLIC PANEL
ALT: 16 U/L (ref 0–44)
AST: 17 U/L (ref 15–41)
Albumin: 3.8 g/dL (ref 3.5–5.0)
Alkaline Phosphatase: 43 U/L (ref 38–126)
Anion gap: 8 (ref 5–15)
BUN: 18 mg/dL (ref 6–20)
CO2: 19 mmol/L — ABNORMAL LOW (ref 22–32)
Calcium: 8.7 mg/dL — ABNORMAL LOW (ref 8.9–10.3)
Chloride: 111 mmol/L (ref 98–111)
Creatinine, Ser: 0.88 mg/dL (ref 0.44–1.00)
GFR, Estimated: >60 mL/min (ref >60)
Glucose, Bld: 190 mg/dL — ABNORMAL HIGH (ref 70–99)
Potassium: 3.8 mmol/L (ref 3.5–5.1)
Sodium: 138 mmol/L (ref 135–145)
Total Bilirubin: 0.4 mg/dL (ref 0.3–1.2)
Total Protein: 7 g/dL (ref 6.5–8.1)

## 2023-03-05 LAB — CBC
HCT: 36.4 % (ref 36.0–46.0)
Hemoglobin: 12.1 g/dL (ref 12.0–15.0)
MCH: 31.9 pg (ref 26.0–34.0)
MCHC: 33.2 g/dL (ref 30.0–36.0)
MCV: 96 fL (ref 80.0–100.0)
Platelets: 214 10*3/uL (ref 150–400)
RBC: 3.79 MIL/uL — ABNORMAL LOW (ref 3.87–5.11)
RDW: 13.7 % (ref 11.5–15.5)
WBC: 6.4 10*3/uL (ref 4.0–10.5)
nRBC: 0 % (ref 0.0–0.2)

## 2023-03-05 LAB — ACETAMINOPHEN LEVEL: Acetaminophen (Tylenol), Serum: <10 ug/mL — ABNORMAL LOW (ref 10–30)

## 2023-03-05 LAB — SALICYLATE LEVEL: Salicylate Lvl: <7 mg/dL — ABNORMAL LOW (ref 7.0–30.0)

## 2023-03-05 LAB — ETHANOL: Alcohol, Ethyl (B): <10 mg/dL (ref <10)

## 2023-03-05 NOTE — ED Triage Notes (Signed)
Arrives stating "I want to kill myself".  States symptoms started today. States "Its everything"
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
## 2023-03-05 NOTE — BH Assessment (Signed)
Comprehensive Clinical Assessment (CCA) Screening, Triage and Referral Note  03/05/2023 Crystal Black [id number]  Chief Complaint: Patient is a 27 y.o. female patient presenting to the ED with suicidal ideation with plans to drink soap and slit her wrists with the sharp end of the blender. Patient states she does not want to return to the group home and says she will follow through on her plans to slit her wrists if she has to return.   HPI:  Crystal Black is a 27 yo female with a psychiatric history of PTSD, Borderline Personality Disorder, Polysubstance abuse, IDD severe with multiple psychiatric hospitalizations.  She is alert and oriented x 4, pleasant and willing to engage. She has appropriate appearance for the environment, with depressed mood and affect. Patient reports experiencing auditory hallucinations last 3 days ago. She also reports visual hallucinations, "I see shadows". She denies delusional thought or paranoia. Patient says "not right now" to experiencing HI   Visit Diagnosis: Suicidal Ideations   Patient Reported Information How did you hear about [place]? Other (Comment) (Group Home)  What Is the Reason for Your Visit/Call Today? Suicidal  How Long Has This Been Causing You Problems? 1-6 months  What Do You Feel Would Help You the Most Today? Treatment for Depression or other mood problem   Have You Recently Had Any Thoughts About Hurting Yourself? Yes  Are You Planning to Commit Suicide/Harm Yourself At This time? Yes   Have you Recently Had Thoughts About Hurting Someone [name]? No  Are You Planning to Harm Someone at This Time? No  Explanation: No data recorded  Have You Used Any Alcohol or Drugs in the Past 24 Hours? No  How Long Ago Did You Use Drugs or Alcohol? No data recorded What Did You Use and How Much? N/A   Do You Currently Have a Therapist/Psychiatrist? Yes  Name of Therapist/Psychiatrist: [facility]   Have You Been Recently Discharged From Any Office  Practice or Programs? No  Explanation of Discharge From Practice/Program: n/a    CCA Screening Triage Referral Assessment Type of Contact: Face-to-Face  Telemedicine Service Delivery:   Is this Initial or Reassessment?   Date Telepsych consult ordered in [facility]:    Time Telepsych consult ordered in [facility]:    Location of Assessment: [facility] ED  Provider Location: [facility] ED    Collateral Involvement: [facility] [phone]   Does Patient Have a Court Appointed Legal Guardian? No data recorded Name and Contact of Legal Guardian: No data recorded If Minor and Not Living with Parent(s), Who has Custody? n/a  Is CPS involved or ever been involved? Never  Is APS involved or ever been involved? Never   Patient Determined To Be At Risk for Harm To Self or Others Based on Review of Patient Reported Information or Presenting Complaint? Yes, for Self-Harm  Method: Plan without intent  Availability of Means: Has close by  Intent: Intends to cause physical harm but not necessarily death  Notification Required: No need or identified person  Additional Information for Danger to Others Potential: -- (n/a)  Additional Comments for Danger to Others Potential: n/a  Are There Guns or Other Weapons in Your Home? No  Types of Guns/Weapons: n/a  Are These Weapons Safely Secured?                            No  Who Could Verify You Are Able To Have These Secured: n/a  Do  You Have any Outstanding Charges, Pending Court Dates, Parole/Probation? N/A  Contacted To Inform of Risk of Harm To Self or Others: Guardian/MH POA:   Does Patient Present under Involuntary Commitment? Yes    [place] of Residence: [place]   Patient Currently Receiving the Following Services: Group Home   Determination of Need: Emergent (2 hours)   Options For Referral: Inpatient Hospitalization   Discharge Disposition: Patient meets criteria for inpatient hospitalization.    [place],  Counselor

## 2023-03-05 NOTE — ED Notes (Signed)
Pt received snack. 

## 2023-03-05 NOTE — ED Provider Notes (Signed)
[facility] Provider Note    Event Date/Time   First MD Initiated Contact with Patient 03/05/23 1534     (approximate)   History   Suicidal   HPI  Crystal Black is a 27 year old female with history of HIV, PTSD, ODD, borderline personality disorder, schizoaffective disorder presenting to the Emergency Department for evaluation of suicidal ideation.  Reports she has been back at her group home for a few weeks.  Earlier today she took the blade out of the blender and attempted to cut herself with it, but was stopped by a group home employee.  She later tried to drink some soap and was once again stopped by a worker at her group home.  She reports that these were suicide attempts.  She now reports that she has homicidal ideation towards her group home workers.  She reports hearing voices telling her to kill other people over the last week.  Denies other complaints.  Denies any ingestions.  I did review her ED visit from 02/01/2023.  At that time, patient presented after an intentional ingestion of cleaner with suicidal ideation.  She was evaluated by psychiatry who recommended inpatient psychiatric hospitalization and patient was transferred to [facility].      Physical Exam   Triage Vital Signs: ED Triage Vitals  Encounter Vitals Group     BP 03/05/23 1519 104/76     Systolic BP Percentile --      Diastolic BP Percentile --      Pulse Rate 03/05/23 1519 (!) 112     Resp 03/05/23 1519 16     Temp 03/05/23 1519 97.9 F (36.6 C)     Temp Source 03/05/23 1519 Oral     SpO2 03/05/23 1519 98 %     Weight 03/05/23 1516 176 lb 12.9 oz (80.2 kg)     Height 03/05/23 1516 5\' 2"  (1.575 m)     Head Circumference --      Peak Flow --      Pain Score 03/05/23 1516 0     Pain Loc --      Pain Education --      Exclude from Growth Chart --     Most recent vital signs: Vitals:   03/05/23 1519  BP: 104/76  Pulse: (!) 112  Resp: 16  Temp: 97.9 F (36.6 C)   SpO2: 98%     General: Awake, interactive  CV:  Mild tachycardia, normal peripheral perfusion Resp:  Unlabored respirations.  Abd:  Nondistended Neuro:  Symmetric facial movement, fluid speech   ED Results / Procedures / Treatments   Labs (all labs ordered are listed, but only abnormal results are displayed) Labs Reviewed  COMPREHENSIVE METABOLIC PANEL - Abnormal; Notable for the following components:      Result Value   CO2 19 (*)    Glucose, Bld 190 (*)    Calcium 8.7 (*)    All other components within normal limits  SALICYLATE LEVEL - Abnormal; Notable for the following components:   Salicylate Lvl <7.0 (*)    All other components within normal limits  ACETAMINOPHEN LEVEL - Abnormal; Notable for the following components:   Acetaminophen (Tylenol), Serum <10 (*)    All other components within normal limits  CBC - Abnormal; Notable for the following components:   RBC 3.79 (*)    All other components within normal limits  ETHANOL  URINE DRUG SCREEN, QUALITATIVE ([facility] ONLY)  POC URINE PREG, ED  EKG EKG independently reviewed interpreted by myself (ER attending) demonstrates:    RADIOLOGY Imaging independently reviewed and interpreted by myself demonstrates:    PROCEDURES:  Critical Care performed: No  Procedures   MEDICATIONS ORDERED IN ED: Medications - No data to display   IMPRESSION / MDM / ASSESSMENT AND PLAN / ED COURSE  I reviewed the triage vital signs and the nursing notes.  Differential diagnosis includes, but is not limited to, decompensated psychiatric illness, substance-induced mood disorder, acute stress response  Patient's presentation is most consistent with acute presentation with potential threat to life or bodily function.   27 year old female presenting to the emergency department for evaluation of SI, HI, auditory hallucinations.  Will consult psychiatry and TTS.  Patient has been placed under IVC.  The patient has been placed  in psychiatric observation due to the need to provide a safe environment for the patient while obtaining psychiatric consultation and evaluation, as well as ongoing medical and medication management to treat the patient's condition.  The patient has been placed under full IVC at this time.  Labs without severe derangement.  Patient medically cleared. Patient evaluated by psychiatry team.  They are recommending inpatient management.        FINAL CLINICAL IMPRESSION(S) / ED DIAGNOSES   Final diagnoses:  Suicidal ideation     Rx / DC Orders   ED Discharge Orders     None        Note:  This document was prepared using Dragon voice recognition software and may include unintentional dictation errors.   Trinna Post, MD 03/05/23 [phone]

## 2023-03-05 NOTE — ED Triage Notes (Signed)
Patient has on burgundy pants with blue shirt , black flip flops , black purse. Purse has three vape pens. Patient belongs has been put in bags.

## 2023-03-06 ENCOUNTER — Ambulatory Visit: Payer: MEDICAID | Admitting: Obstetrics and Gynecology

## 2023-03-06 DIAGNOSIS — Z113 Encounter for screening for infections with a predominantly sexual mode of transmission: Secondary | ICD-10-CM

## 2023-03-06 DIAGNOSIS — Z124 Encounter for screening for malignant neoplasm of cervix: Secondary | ICD-10-CM

## 2023-03-06 DIAGNOSIS — Z01419 Encounter for gynecological examination (general) (routine) without abnormal findings: Secondary | ICD-10-CM

## 2023-03-06 DIAGNOSIS — Z3046 Encounter for surveillance of implantable subdermal contraceptive: Secondary | ICD-10-CM

## 2023-03-06 MED ORDER — DOLUTEGRAVIR SODIUM 50 MG PO TABS
50.0000 mg | ORAL_TABLET | Freq: Every day | ORAL | Status: DC
  Administered 2023-03-06 – 2023-03-07 (×2): 50 mg via ORAL
  Filled 2023-03-06 (×3): qty 1

## 2023-03-06 MED ORDER — MONTELUKAST SODIUM 10 MG PO TABS
10.0000 mg | ORAL_TABLET | Freq: Every day | ORAL | Status: DC
  Administered 2023-03-06 – 2023-03-07 (×2): 10 mg via ORAL
  Filled 2023-03-06 (×2): qty 1

## 2023-03-06 MED ORDER — FAMOTIDINE 20 MG PO TABS
20.0000 mg | ORAL_TABLET | Freq: Every day | ORAL | Status: DC | PRN
  Administered 2023-03-07: 20 mg via ORAL
  Filled 2023-03-06: qty 1

## 2023-03-06 MED ORDER — DARUNAVIR-COBICISTAT 800-150 MG PO TABS
1.0000 | ORAL_TABLET | Freq: Every day | ORAL | Status: DC
  Administered 2023-03-06 – 2023-03-07 (×2): 1 via ORAL
  Filled 2023-03-06 (×2): qty 1

## 2023-03-06 MED ORDER — ALBUTEROL SULFATE (2.5 MG/3ML) 0.083% IN NEBU: 2.5000 mg | INHALATION_SOLUTION | RESPIRATORY_TRACT | Status: DC | PRN

## 2023-03-06 MED ORDER — LINACLOTIDE 145 MCG PO CAPS
290.0000 ug | ORAL_CAPSULE | Freq: Every day | ORAL | Status: DC
  Filled 2023-03-06: qty 1
  Filled 2023-03-06: qty 2

## 2023-03-06 MED ORDER — METHOCARBAMOL 500 MG PO TABS
500.0000 mg | ORAL_TABLET | Freq: Every day | ORAL | Status: DC | PRN
  Administered 2023-03-07: 500 mg via ORAL
  Filled 2023-03-06: qty 1

## 2023-03-06 MED ORDER — TOPIRAMATE 25 MG PO TABS
50.0000 mg | ORAL_TABLET | Freq: Every day | ORAL | Status: DC
  Administered 2023-03-06 – 2023-03-07 (×2): 50 mg via ORAL
  Filled 2023-03-06 (×2): qty 2

## 2023-03-06 MED ORDER — HYDROXYZINE HCL 25 MG PO TABS: 25.0000 mg | ORAL_TABLET | Freq: Three times a day (TID) | ORAL | Status: DC | PRN

## 2023-03-06 MED ORDER — DOCUSATE SODIUM 100 MG PO CAPS
300.0000 mg | ORAL_CAPSULE | Freq: Every day | ORAL | Status: DC
  Administered 2023-03-06 – 2023-03-07 (×2): 300 mg via ORAL
  Filled 2023-03-06 (×2): qty 3

## 2023-03-06 MED ORDER — DIVALPROEX SODIUM 500 MG PO DR TAB
500.0000 mg | DELAYED_RELEASE_TABLET | Freq: Two times a day (BID) | ORAL | Status: DC
  Administered 2023-03-06 – 2023-03-07 (×4): 500 mg via ORAL
  Filled 2023-03-06 (×4): qty 1

## 2023-03-06 MED ORDER — FLUOXETINE HCL 20 MG PO CAPS
40.0000 mg | ORAL_CAPSULE | Freq: Every day | ORAL | Status: DC
  Administered 2023-03-06 – 2023-03-07 (×2): 40 mg via ORAL
  Filled 2023-03-06 (×2): qty 2

## 2023-03-06 MED ORDER — BENZTROPINE MESYLATE 1 MG PO TABS
0.5000 mg | ORAL_TABLET | Freq: Every day | ORAL | Status: DC
  Administered 2023-03-06 – 2023-03-07 (×2): 0.5 mg via ORAL
  Filled 2023-03-06 (×2): qty 1

## 2023-03-06 MED ORDER — TRIAMCINOLONE ACETONIDE 0.1 % EX CREA
1.0000 | TOPICAL_CREAM | Freq: Two times a day (BID) | CUTANEOUS | Status: DC
  Administered 2023-03-07 (×2): 1 via TOPICAL
  Filled 2023-03-06 (×2): qty 15

## 2023-03-06 MED ORDER — PRAZOSIN HCL 1 MG PO CAPS
5.0000 mg | ORAL_CAPSULE | Freq: Every day | ORAL | Status: DC
  Administered 2023-03-06: 5 mg via ORAL
  Filled 2023-03-06: qty 5

## 2023-03-06 MED ORDER — TRAZODONE HCL 50 MG PO TABS
50.0000 mg | ORAL_TABLET | Freq: Every day | ORAL | Status: DC
  Administered 2023-03-06 – 2023-03-07 (×2): 50 mg via ORAL
  Filled 2023-03-06 (×2): qty 1

## 2023-03-06 NOTE — ED Notes (Signed)
Pt given a breakfast tray.

## 2023-03-06 NOTE — ED Provider Notes (Signed)
Emergency Medicine Observation Re-evaluation Note  Crystal Black is a 27 y.o. female, seen on rounds today.  Pt initially presented to the ED for complaints of Suicidal  Currently, the patient is is no acute distress. Denies any concerns at this time.  Physical Exam  Blood pressure 104/76, pulse (!) 112, temperature 97.9 F (36.6 C), temperature source Oral, resp. rate 16, height 5\' 2"  (1.575 m), weight 80.2 kg, SpO2 98%.  Physical Exam: General: No apparent distress Pulm: Normal WOB Neuro: Moving all extremities Psych: Resting comfortably     ED Course / MDM     I have reviewed the labs performed to date as well as medications administered while in observation.  Recent changes in the last 24 hours include: No acute events overnight.  Plan   Current plan: Patient awaiting psychiatric disposition. Patient is not under full IVC at this time.    Najae Filsaime, Layla Maw, DO 03/06/23 [phone]

## 2023-03-06 NOTE — ED Notes (Signed)
Lunch tray given. 

## 2023-03-06 NOTE — ED Provider Notes (Signed)
Completed med reconciliation    Sharyn Creamer, MD 03/06/23 1059

## 2023-03-06 NOTE — ED Notes (Signed)
IVC/recommend inpatient admission when medically cleared.

## 2023-03-06 NOTE — BH Assessment (Signed)
Adult MH  Referral information for Psychiatric Hospitalization faxed to:   [facility] ([phone]-or- [phone]),   [facility] ([phone]),   [facility] ([phone] -or- [phone]),   [place] ([phone]---[phone]---[phone]),   [place] ([phone] or [phone])   [place] ([phone] or [phone]),   [place] ([phone]) 

## 2023-03-06 NOTE — ED Notes (Signed)
Pt using telephone to call Langley Adie from group home

## 2023-03-07 ENCOUNTER — Inpatient Hospital Stay
Admission: AD | Admit: 2023-03-07 | Discharge: 2023-03-13 | DRG: 885 | Disposition: A | Discharge status: Home / Self Care | Payer: MEDICAID | Source: Intra-hospital | Attending: Psychiatry | Admitting: Psychiatry

## 2023-03-07 ENCOUNTER — Encounter: Payer: Self-pay | Admitting: Psychiatry

## 2023-03-07 ENCOUNTER — Other Ambulatory Visit: Payer: Self-pay

## 2023-03-07 DIAGNOSIS — F79 Unspecified intellectual disabilities: Secondary | ICD-10-CM | POA: Diagnosis present

## 2023-03-07 DIAGNOSIS — F515 Nightmare disorder: Secondary | ICD-10-CM | POA: Diagnosis present

## 2023-03-07 DIAGNOSIS — F17210 Nicotine dependence, cigarettes, uncomplicated: Secondary | ICD-10-CM | POA: Diagnosis present

## 2023-03-07 DIAGNOSIS — F332 Major depressive disorder, recurrent severe without psychotic features: Secondary | ICD-10-CM | POA: Diagnosis not present

## 2023-03-07 DIAGNOSIS — K219 Gastro-esophageal reflux disease without esophagitis: Secondary | ICD-10-CM | POA: Diagnosis present

## 2023-03-07 DIAGNOSIS — F419 Anxiety disorder, unspecified: Secondary | ICD-10-CM | POA: Diagnosis present

## 2023-03-07 DIAGNOSIS — J45909 Unspecified asthma, uncomplicated: Secondary | ICD-10-CM | POA: Diagnosis present

## 2023-03-07 DIAGNOSIS — F603 Borderline personality disorder: Secondary | ICD-10-CM | POA: Diagnosis present

## 2023-03-07 DIAGNOSIS — Z91010 Allergy to peanuts: Secondary | ICD-10-CM

## 2023-03-07 DIAGNOSIS — Z813 Family history of other psychoactive substance abuse and dependence: Secondary | ICD-10-CM

## 2023-03-07 DIAGNOSIS — G4700 Insomnia, unspecified: Secondary | ICD-10-CM | POA: Diagnosis present

## 2023-03-07 DIAGNOSIS — Z91013 Allergy to seafood: Secondary | ICD-10-CM | POA: Diagnosis not present

## 2023-03-07 DIAGNOSIS — R45851 Suicidal ideations: Secondary | ICD-10-CM | POA: Diagnosis present

## 2023-03-07 DIAGNOSIS — K5900 Constipation, unspecified: Secondary | ICD-10-CM | POA: Diagnosis present

## 2023-03-07 DIAGNOSIS — F4310 Post-traumatic stress disorder, unspecified: Secondary | ICD-10-CM | POA: Diagnosis present

## 2023-03-07 DIAGNOSIS — Z21 Asymptomatic human immunodeficiency virus [HIV] infection status: Secondary | ICD-10-CM | POA: Diagnosis present

## 2023-03-07 DIAGNOSIS — Z79899 Other long term (current) drug therapy: Secondary | ICD-10-CM

## 2023-03-07 DIAGNOSIS — F333 Major depressive disorder, recurrent, severe with psychotic symptoms: Secondary | ICD-10-CM | POA: Diagnosis not present

## 2023-03-07 LAB — URINALYSIS, ROUTINE W REFLEX MICROSCOPIC
Bilirubin Urine: NEGATIVE
Glucose, UA: NEGATIVE mg/dL
Hgb urine dipstick: NEGATIVE
Ketones, ur: NEGATIVE mg/dL
Leukocytes,Ua: NEGATIVE
Nitrite: NEGATIVE
Protein, ur: NEGATIVE mg/dL
Specific Gravity, Urine: 1.016 (ref 1.005–1.030)
pH: 6 (ref 5.0–8.0)

## 2023-03-07 MED ORDER — BENZTROPINE MESYLATE 1 MG PO TABS
0.5000 mg | ORAL_TABLET | Freq: Every day | ORAL | Status: DC
  Administered 2023-03-08 – 2023-03-12 (×4): 0.5 mg via ORAL
  Filled 2023-03-07 (×4): qty 1

## 2023-03-07 MED ORDER — DIPHENHYDRAMINE HCL 25 MG PO CAPS
50.0000 mg | ORAL_CAPSULE | Freq: Three times a day (TID) | ORAL | Status: DC | PRN
  Administered 2023-03-13: 50 mg via ORAL
  Filled 2023-03-07: qty 2

## 2023-03-07 MED ORDER — TRAZODONE HCL 50 MG PO TABS
50.0000 mg | ORAL_TABLET | Freq: Every day | ORAL | Status: DC
  Administered 2023-03-08 – 2023-03-12 (×4): 50 mg via ORAL
  Filled 2023-03-07 (×5): qty 1

## 2023-03-07 MED ORDER — ALBUTEROL SULFATE (2.5 MG/3ML) 0.083% IN NEBU: 2.5000 mg | INHALATION_SOLUTION | RESPIRATORY_TRACT | Status: DC | PRN

## 2023-03-07 MED ORDER — LINACLOTIDE 145 MCG PO CAPS
290.0000 ug | ORAL_CAPSULE | Freq: Every day | ORAL | Status: DC
  Administered 2023-03-08 – 2023-03-13 (×6): 290 ug via ORAL
  Filled 2023-03-07 (×6): qty 2

## 2023-03-07 MED ORDER — MAGNESIUM HYDROXIDE 400 MG/5ML PO SUSP
30.0000 mL | Freq: Every day | ORAL | Status: DC | PRN
  Administered 2023-03-08: 30 mL via ORAL
  Filled 2023-03-07: qty 30

## 2023-03-07 MED ORDER — LORAZEPAM 2 MG/ML IJ SOLN
2.0000 mg | Freq: Three times a day (TID) | INTRAMUSCULAR | Status: DC | PRN
  Administered 2023-03-11: 2 mg via INTRAMUSCULAR
  Filled 2023-03-07: qty 1

## 2023-03-07 MED ORDER — METHOCARBAMOL 500 MG PO TABS: 500.0000 mg | ORAL_TABLET | Freq: Every day | ORAL | Status: DC | PRN

## 2023-03-07 MED ORDER — DIPHENHYDRAMINE HCL 50 MG/ML IJ SOLN
50.0000 mg | Freq: Three times a day (TID) | INTRAMUSCULAR | Status: DC | PRN
  Administered 2023-03-09 – 2023-03-11 (×2): 50 mg via INTRAMUSCULAR
  Filled 2023-03-07 (×2): qty 1

## 2023-03-07 MED ORDER — MONTELUKAST SODIUM 10 MG PO TABS
10.0000 mg | ORAL_TABLET | Freq: Every day | ORAL | Status: DC
  Administered 2023-03-08 – 2023-03-13 (×6): 10 mg via ORAL
  Filled 2023-03-07 (×7): qty 1

## 2023-03-07 MED ORDER — HALOPERIDOL 5 MG PO TABS: 5.0000 mg | ORAL_TABLET | Freq: Three times a day (TID) | ORAL | Status: DC | PRN

## 2023-03-07 MED ORDER — TOPIRAMATE 25 MG PO TABS
50.0000 mg | ORAL_TABLET | Freq: Every day | ORAL | Status: DC
  Administered 2023-03-08 – 2023-03-12 (×5): 50 mg via ORAL
  Filled 2023-03-07 (×5): qty 2

## 2023-03-07 MED ORDER — FLUOXETINE HCL 20 MG PO CAPS
40.0000 mg | ORAL_CAPSULE | Freq: Every day | ORAL | Status: DC
  Administered 2023-03-08 – 2023-03-12 (×4): 40 mg via ORAL
  Filled 2023-03-07 (×5): qty 2

## 2023-03-07 MED ORDER — FAMOTIDINE 20 MG PO TABS: 20.0000 mg | ORAL_TABLET | Freq: Every day | ORAL | Status: DC | PRN

## 2023-03-07 MED ORDER — DIVALPROEX SODIUM 500 MG PO DR TAB
500.0000 mg | DELAYED_RELEASE_TABLET | Freq: Two times a day (BID) | ORAL | Status: DC
  Administered 2023-03-08 – 2023-03-11 (×7): 500 mg via ORAL
  Filled 2023-03-07 (×7): qty 1

## 2023-03-07 MED ORDER — HYDROXYZINE HCL 25 MG PO TABS
25.0000 mg | ORAL_TABLET | Freq: Three times a day (TID) | ORAL | Status: DC | PRN
  Administered 2023-03-08 – 2023-03-13 (×4): 25 mg via ORAL
  Filled 2023-03-07 (×4): qty 1

## 2023-03-07 MED ORDER — TRIAMCINOLONE ACETONIDE 0.1 % EX CREA
1.0000 | TOPICAL_CREAM | Freq: Two times a day (BID) | CUTANEOUS | Status: DC
  Administered 2023-03-08 – 2023-03-11 (×6): 1 via TOPICAL

## 2023-03-07 MED ORDER — ALUM & MAG HYDROXIDE-SIMETH 200-200-20 MG/5ML PO SUSP
30.0000 mL | ORAL | Status: DC | PRN
  Administered 2023-03-08: 30 mL via ORAL

## 2023-03-07 MED ORDER — LORAZEPAM 2 MG PO TABS: 2.0000 mg | ORAL_TABLET | Freq: Three times a day (TID) | ORAL | Status: DC | PRN

## 2023-03-07 MED ORDER — PRAZOSIN HCL 2 MG PO CAPS
5.0000 mg | ORAL_CAPSULE | Freq: Every day | ORAL | Status: DC
  Administered 2023-03-08 – 2023-03-12 (×3): 5 mg via ORAL
  Filled 2023-03-07 (×5): qty 1

## 2023-03-07 MED ORDER — DOCUSATE SODIUM 100 MG PO CAPS
300.0000 mg | ORAL_CAPSULE | Freq: Every day | ORAL | Status: DC
  Administered 2023-03-08 – 2023-03-13 (×6): 300 mg via ORAL
  Filled 2023-03-07 (×6): qty 3

## 2023-03-07 MED ORDER — CEPHALEXIN 500 MG PO CAPS
500.0000 mg | ORAL_CAPSULE | Freq: Two times a day (BID) | ORAL | Status: DC
  Administered 2023-03-07: 500 mg via ORAL
  Filled 2023-03-07 (×2): qty 1

## 2023-03-07 MED ORDER — DOLUTEGRAVIR SODIUM 50 MG PO TABS
50.0000 mg | ORAL_TABLET | Freq: Every day | ORAL | Status: DC
  Administered 2023-03-08 – 2023-03-13 (×6): 50 mg via ORAL
  Filled 2023-03-07 (×6): qty 1

## 2023-03-07 MED ORDER — DARUNAVIR-COBICISTAT 800-150 MG PO TABS
1.0000 | ORAL_TABLET | Freq: Every day | ORAL | Status: DC
  Administered 2023-03-08 – 2023-03-13 (×6): 1 via ORAL
  Filled 2023-03-07 (×6): qty 1

## 2023-03-07 MED ORDER — ACETAMINOPHEN 325 MG PO TABS
650.0000 mg | ORAL_TABLET | Freq: Four times a day (QID) | ORAL | Status: DC | PRN
  Administered 2023-03-13: 650 mg via ORAL
  Filled 2023-03-07: qty 2

## 2023-03-07 MED ORDER — HALOPERIDOL LACTATE 5 MG/ML IJ SOLN
5.0000 mg | Freq: Three times a day (TID) | INTRAMUSCULAR | Status: DC | PRN
  Administered 2023-03-11: 5 mg via INTRAMUSCULAR
  Filled 2023-03-07: qty 1

## 2023-03-07 NOTE — ED Notes (Signed)
Pt was transferred from BHU 7 TO [facility] ROOM 24

## 2023-03-07 NOTE — ED Provider Notes (Signed)
Emergency Medicine Observation Re-evaluation Note  Crystal Black is a 27 y.o. female, seen on rounds today.  Pt initially presented to the ED for complaints of Suicidal Currently, the patient is calm in NAD.  Physical Exam  BP 100/73 (BP Location: Left Arm)   Pulse (!) 113   Temp 98.1 F (36.7 C) (Oral)   Resp 18   Ht 5\' 2"  (1.575 m)   Wt 80.2 kg   SpO2 97%   BMI 32.34 kg/m    ED Course / MDM  EKG:   I have reviewed the labs performed to date as well as medications administered while in observation.  Recent changes in the last 24 hours include none.  Plan  Current plan is for Psych dispo.    Shaune Pollack, MD 03/07/23 [phone]

## 2023-03-07 NOTE — ED Notes (Signed)
Receiving RN giving medications to another patient on the unit right now, will call this RN back for report.

## 2023-03-07 NOTE — Tx Team (Signed)
Initial Treatment Plan 03/07/2023 10:19 PM [name] WUJ:[id number]    PATIENT STRESSORS: Medication change or noncompliance   Other: Pt doesn't like her group home      PATIENT STRENGTHS: Ability for insight  Motivation for treatment/growth    PATIENT IDENTIFIED PROBLEMS: Depression  Anxiety  Suicidal Ideation                 DISCHARGE CRITERIA:  Motivation to continue treatment in a less acute level of care Verbal commitment to aftercare and medication compliance  PRELIMINARY DISCHARGE PLAN: Outpatient therapy Return to previous living arrangement  PATIENT/FAMILY INVOLVEMENT: This treatment plan has been presented to and reviewed with the patient, [name] Emge.  The patient has been given the opportunity to ask questions and make suggestions.  Elmyra Ricks, RN 03/07/2023, 10:19 PM

## 2023-03-07 NOTE — BH Assessment (Addendum)
Patient is to be admitted to [facility] BMU tonight 03/07/23 after 9:00pm by Dr.  Marlou Porch .  Attending Physician will be Dr. Marlou Porch.   Patient has been assigned to room 324, by [facility] Charge Nurse Alyas Creary.    ER staff is aware of the admission: Misty Stanley, ER Secretary   Dr. Erma Heritage, ER MD  Genevie Cheshire, Patient's Nurse  Rob, Patient Access.

## 2023-03-07 NOTE — ED Notes (Signed)
Spoke to lab, can add on culture to urine collected this a.m.

## 2023-03-07 NOTE — ED Notes (Signed)
IVC  PT to be admitted to [facility] BMU tonight after 9:00 pm

## 2023-03-07 NOTE — ED Notes (Signed)
Attempted to update legal guardian on patient disposition- no answer at this time.

## 2023-03-07 NOTE — Plan of Care (Signed)
 Pt new to the unit tonight, hasn't had time to progress  Problem: Education: Goal: Knowledge of General Education information will improve Description: Including pain rating scale, medication(s)/side effects and non-pharmacologic comfort measures Outcome: Not Progressing   Problem: Health Behavior/Discharge Planning: Goal: Ability to manage health-related needs will improve Outcome: Not Progressing   Problem: Clinical Measurements: Goal: Ability to maintain clinical measurements within normal limits will improve Outcome: Not Progressing Goal: Will remain free from infection Outcome: Not Progressing Goal: Diagnostic test results will improve Outcome: Not Progressing Goal: Respiratory complications will improve Outcome: Not Progressing Goal: Cardiovascular complication will be avoided Outcome: Not Progressing   Problem: Activity: Goal: Risk for activity intolerance will decrease Outcome: Not Progressing   Problem: Nutrition: Goal: Adequate nutrition will be maintained Outcome: Not Progressing   Problem: Coping: Goal: Level of anxiety will decrease Outcome: Not Progressing   Problem: Elimination: Goal: Will not experience complications related to bowel motility Outcome: Not Progressing Goal: Will not experience complications related to urinary retention Outcome: Not Progressing   Problem: Pain Managment: Goal: General experience of comfort will improve Outcome: Not Progressing   Problem: Safety: Goal: Ability to remain free from injury will improve Outcome: Not Progressing   Problem: Skin Integrity: Goal: Risk for impaired skin integrity will decrease Outcome: Not Progressing   Problem: Education: Goal: Knowledge of [place] [facility] Education information/materials will improve Outcome: Not Progressing Goal: Emotional status will improve Outcome: Not Progressing Goal: Mental status will improve Outcome: Not Progressing Goal: Verbalization of understanding  the information provided will improve Outcome: Not Progressing   Problem: Activity: Goal: Interest or engagement in activities will improve Outcome: Not Progressing Goal: Sleeping patterns will improve Outcome: Not Progressing   Problem: Coping: Goal: Ability to verbalize frustrations and anger appropriately will improve Outcome: Not Progressing Goal: Ability to demonstrate self-control will improve Outcome: Not Progressing   Problem: Health Behavior/Discharge Planning: Goal: Identification of resources available to assist in meeting health care needs will improve Outcome: Not Progressing Goal: Compliance with treatment plan for underlying cause of condition will improve Outcome: Not Progressing   Problem: Physical Regulation: Goal: Ability to maintain clinical measurements within normal limits will improve Outcome: Not Progressing   Problem: Safety: Goal: Periods of time without injury will increase Outcome: Not Progressing

## 2023-03-07 NOTE — Progress Notes (Signed)
Patient admitted from [name] - ED, report received from [place], [place]. Pt calm and pleasant during assessment denying SI/HI/AVH. Pt stated he doesn't like her group home. Pt stated she doesn't have to go back there and has an apartment lined up. Pt given education. Pt given snack. Pt oriented to the unit and her room. Pt took her night medications in the ED prior to coming down to our unit. Pt being monitored Q 15 minutes for safety per unit protocol.

## 2023-03-08 DIAGNOSIS — F333 Major depressive disorder, recurrent, severe with psychotic symptoms: Secondary | ICD-10-CM

## 2023-03-08 MED ORDER — NICOTINE POLACRILEX 2 MG MT GUM
2.0000 mg | CHEWING_GUM | OROMUCOSAL | Status: DC | PRN
  Administered 2023-03-09 – 2023-03-13 (×6): 2 mg via ORAL
  Filled 2023-03-08 (×6): qty 1

## 2023-03-08 NOTE — BHH Suicide Risk Assessment (Signed)
[facility] Admission Suicide Risk Assessment   Nursing information obtained from:  Patient Demographic factors:  Caucasian, Low socioeconomic status Current Mental Status:  Suicidal ideation indicated by patient Loss Factors:  NA (Pt doesn't like her group home) Historical Factors:  Impulsivity Risk Reduction Factors:  Positive social support, Positive therapeutic relationship  Total Time spent with patient: 1 hour Principal Problem: Major depressive disorder, recurrent episode, severe (HCC) Diagnosis:  Principal Problem:   Major depressive disorder, recurrent episode, severe (HCC)  Subjective Data:  Patient is a 27 y.o. female patient presenting to the ED with suicidal ideation with plans to drink soap and slit her wrists with the sharp end of the blender. Patient states she does not want to return to the group home and says she will follow through on her plans to slit her wrists if she has to return.   Crystal Black is a 27 yo female with a psychiatric history of PTSD, Borderline Personality Disorder, Polysubstance abuse, IDD severe with multiple psychiatric hospitalizations.  She is alert and oriented x 4, pleasant and willing to engage. She has appropriate appearance for the environment, with depressed mood and affect. Patient reports experiencing auditory hallucinations last 3 days ago. She also reports visual hallucinations, "I see shadows". She denies delusional thought or paranoia. Patient says "not right now" to experiencing HI  Continued Clinical Symptoms:  Alcohol Use Disorder Identification Test Final Score (AUDIT): 0 The "Alcohol Use Disorders Identification Test", Guidelines for Use in Primary Care, Second Edition.  World Science writer [facility]). Score between 0-7:  no or low risk or alcohol related problems. Score between 8-15:  moderate risk of alcohol related problems. Score between 16-19:  high risk of alcohol related problems. Score 20 or above:  warrants further diagnostic  evaluation for alcohol dependence and treatment.   CLINICAL FACTORS:   Bipolar Disorder:   Mixed State Personality Disorders:   Cluster B   Musculoskeletal: Strength & Muscle Tone: within normal limits Gait & Station: normal Patient leans: N/A  Psychiatric Specialty Exam:  Presentation  General Appearance:  Appropriate for Environment  Eye Contact: Good  Speech: Clear and Coherent  Speech Volume: Normal  Handedness: Right   Mood and Affect  Mood: Depressed  Affect: Congruent   Thought Process  Thought Processes: Coherent  Descriptions of Associations:Intact  Orientation:Full (Time, Place and Person)  Thought Content:Illogical  History of Schizophrenia/Schizoaffective disorder:No  Duration of Psychotic Symptoms:N/A  Hallucinations:No data recorded Ideas of Reference:None  Suicidal Thoughts:No data recorded Homicidal Thoughts:No data recorded  Sensorium  Memory: Immediate Good  Judgment: Poor  Insight: Poor   Executive Functions  Concentration: Good  Attention Span: Good  Recall: Good  Fund of Knowledge: Fair  Language: Good   Psychomotor Activity  Psychomotor Activity:No data recorded  Assets  Assets: Communication Skills   Sleep  Sleep:No data recorded    Blood pressure 112/73, pulse 99, temperature (!) 97.3 F (36.3 C), temperature source Oral, resp. rate 16, height 5\' 2"  (1.575 m), weight 80.2 kg, SpO2 99%. Body mass index is 32.34 kg/m.   COGNITIVE FEATURES THAT CONTRIBUTE TO RISK:  Thought constriction (tunnel vision)    SUICIDE RISK:   Minimal: No identifiable suicidal ideation.  Patients presenting with no risk factors but with morbid ruminations; may be classified as minimal risk based on the severity of the depressive symptoms  PLAN OF CARE: See orders  I certify that inpatient services furnished can reasonably be expected to improve the patient's condition.   Edina Winningham Tresea Mall,  DO 03/08/2023, 11:57 AM

## 2023-03-08 NOTE — Group Note (Signed)
Date:  03/08/2023 Time:  5:23 PM  Group Topic/Focus:  Outdoor Recreation    Participation Level:  Active  Participation Quality:  Appropriate  Affect:  Appropriate  Cognitive:  Appropriate  Insight: Appropriate  Engagement in Group:  Engaged  Modes of Intervention:  Activity  Additional Comments:    Wilford Corner 03/08/2023, 5:23 PM

## 2023-03-08 NOTE — Group Note (Signed)
Date:  03/08/2023 Time:  8:35 PM  Group Topic/Focus:  Goals Group:   The focus of this group is to help patients establish daily goals to achieve during treatment and discuss how the patient can incorporate goal setting into their daily lives to aide in recovery.    Participation Level:  Did Not Attend  Participation Quality:   none  Affect:   none  Cognitive:   none  Insight: None  Engagement in Group:   none  Modes of Intervention:   none  Additional Comments:  none  Ashvin Adelson 03/08/2023, 8:35 PM

## 2023-03-08 NOTE — Progress Notes (Signed)
Patient calm and pleasant during assessment. Pt denies SI/HI/AVH, pt stated that they have found her a new group home and she is happy about that. Pt observed interacting appropriately with staff and peers on the unit. Pt compliant with medication administration per MD orders. Pt given education, support, and encouragement to be active in her treatment plan. Pt being monitored Q 15 minutes for safety per unit protocol, remains safe on the unit
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
## 2023-03-08 NOTE — Plan of Care (Signed)
Assumed care of patient this am, she presented in good spirits, affect bright and cheerful. Pt. Reported being happy, "they found me another group home". Pt was compliant with taking her medications,attended groups, denied side effects and denied thoughts of self harm nor harm to others.Pt also c/o GI distress after eating and was given Mylanta as ordered. Will continue to monitor for safety and support according to plan of care.

## 2023-03-08 NOTE — BH IP Treatment Plan (Signed)
Interdisciplinary Treatment and Diagnostic Plan Update  03/08/2023 Time of Session: 09:12 Crystal Black MRN: [id number]  Principal Diagnosis: Major depressive disorder, recurrent episode, severe (HCC)  Secondary Diagnoses: Principal Problem:   Major depressive disorder, recurrent episode, severe (HCC)   Current Medications:  Current Facility-Administered Medications  Medication Dose Route Frequency Provider Last Rate Last Admin   acetaminophen (TYLENOL) tablet 650 mg  650 mg Oral Q6H PRN Bennett, Christal H, NP       albuterol (PROVENTIL) (2.5 MG/3ML) 0.083% nebulizer solution 2.5 mg  2.5 mg Inhalation Q4H PRN Bennett, Christal H, NP       alum & mag hydroxide-simeth (MAALOX/MYLANTA) 200-200-20 MG/5ML suspension 30 mL  30 mL Oral Q4H PRN Bennett, Christal H, NP       benztropine (COGENTIN) tablet 0.5 mg  0.5 mg Oral QHS Bennett, Christal H, NP       darunavir-cobicistat (PREZCOBIX) 800-150 MG per tablet 1 tablet  1 tablet Oral Q breakfast Bennett, Christal H, NP   1 tablet at 03/08/23 0905   diphenhydrAMINE (BENADRYL) capsule 50 mg  50 mg Oral TID PRN Bennett, Christal H, NP       Or   diphenhydrAMINE (BENADRYL) injection 50 mg  50 mg Intramuscular TID PRN Bennett, Christal H, NP       divalproex (DEPAKOTE) DR tablet 500 mg  500 mg Oral Q12H Bennett, Christal H, NP   500 mg at 03/08/23 0905   docusate sodium (COLACE) capsule 300 mg  300 mg Oral Daily Bennett, Christal H, NP   300 mg at 03/08/23 0905   dolutegravir (TIVICAY) tablet 50 mg  50 mg Oral Daily Bennett, Christal H, NP   50 mg at 03/08/23 0905   famotidine (PEPCID) tablet 20 mg  20 mg Oral Daily PRN Bennett, Christal H, NP       FLUoxetine (PROZAC) capsule 40 mg  40 mg Oral QHS Bennett, Christal H, NP       haloperidol (HALDOL) tablet 5 mg  5 mg Oral TID PRN Bennett, Christal H, NP       Or   haloperidol lactate (HALDOL) injection 5 mg  5 mg Intramuscular TID PRN Bennett, Christal H, NP       hydrOXYzine (ATARAX) tablet 25 mg   25 mg Oral TID PRN Bennett, Christal H, NP       linaclotide (LINZESS) capsule 290 mcg  290 mcg Oral QAC breakfast Bennett, Christal H, NP   290 mcg at 03/08/23 0905   LORazepam (ATIVAN) tablet 2 mg  2 mg Oral TID PRN Bennett, Christal H, NP       Or   LORazepam (ATIVAN) injection 2 mg  2 mg Intramuscular TID PRN Bennett, Christal H, NP       magnesium hydroxide (MILK OF MAGNESIA) suspension 30 mL  30 mL Oral Daily PRN Bennett, Christal H, NP       methocarbamol (ROBAXIN) tablet 500 mg  500 mg Oral Daily PRN Bennett, Christal H, NP       montelukast (SINGULAIR) tablet 10 mg  10 mg Oral Daily Bennett, Christal H, NP   10 mg at 03/08/23 0911   prazosin (MINIPRESS) capsule 5 mg  5 mg Oral QHS Bennett, Christal H, NP       topiramate (TOPAMAX) tablet 50 mg  50 mg Oral QHS Bennett, Christal H, NP       traZODone (DESYREL) tablet 50 mg  50 mg Oral QHS Bennett, Christal H, NP  triamcinolone cream (KENALOG) 0.1 % cream 1 Application  1 Application Topical BID Bennett, Christal H, NP   1 Application at 03/08/23 0912   PTA Medications: Medications Prior to Admission  Medication Sig Dispense Refill Last Dose   albuterol (VENTOLIN HFA) 108 (90 Base) MCG/ACT inhaler Inhale 2 puffs into the lungs every 4 (four) hours as needed for wheezing or shortness of breath.      benztropine (COGENTIN) 0.5 MG tablet Take 1 tablet (0.5 mg total) by mouth 2 (two) times daily. (Patient taking differently: Take 0.5 mg by mouth at bedtime.) 60 tablet 1    darunavir-cobicistat (PREZCOBIX) 800-150 MG tablet Take 1 tablet by mouth daily with breakfast. Swallow whole. Do NOT crush, break or chew tablets. Take with food. 30 tablet 1    divalproex (DEPAKOTE) 500 MG DR tablet Take 1 tablet (500 mg total) by mouth every 12 (twelve) hours. (Patient taking differently: Take 500 mg by mouth 3 (three) times daily.) 60 tablet 1    docusate sodium (COLACE) 100 MG capsule Take 300 mg by mouth daily.      dolutegravir (TIVICAY) 50 MG  tablet Take 1 tablet (50 mg total) by mouth daily. 30 tablet 1    EPINEPHrine 0.3 mg/0.3 mL IJ SOAJ injection Inject 0.3 mg into the muscle as needed for anaphylaxis.      etonogestrel (NEXPLANON) 68 MG IMPL implant 1 each (68 mg total) by Subdermal route once for 1 dose. 1 each 0    famotidine (PEPCID) 20 MG tablet Take 20 mg by mouth daily as needed for heartburn.      Fiber Adult Gummies 2 g CHEW Chew 1 Units by mouth 2 (two) times daily.      FLUoxetine (PROZAC) 40 MG capsule Take 40 mg by mouth at bedtime.      glycerin adult 2 g suppository Place 1 suppository rectally as needed for constipation.      hydrOXYzine (ATARAX) 25 MG tablet Take 1 tablet (25 mg total) by mouth 3 (three) times daily as needed. 60 tablet 1    ibuprofen (ADVIL) 800 MG tablet Take 1 tablet (800 mg total) by mouth every 8 (eight) hours as needed for moderate pain. 15 tablet 0    INVEGA SUSTENNA 156 MG/ML SUSY injection Inject 234 mg into the muscle every 30 (thirty) days.      linaclotide (LINZESS) 290 MCG CAPS capsule Take 290 mcg by mouth daily before breakfast.      magnesium citrate SOLN Take 1 Bottle by mouth once.      methocarbamol (ROBAXIN) 500 MG tablet Take 500 mg by mouth daily as needed (back pain).      montelukast (SINGULAIR) 10 MG tablet Take 10 mg by mouth daily.      nicotine polacrilex (NICORETTE) 2 MG gum Take 2 mg by mouth as needed for smoking cessation.      prazosin (MINIPRESS) 5 MG capsule Take 1 capsule (5 mg total) by mouth at bedtime. 30 capsule 1    SODIUM FLUORIDE, DENTAL RINSE, 0.2 % SOLN Use as directed 10 mLs in the mouth or throat at bedtime.      topiramate (TOPAMAX) 50 MG tablet Take 1 tablet (50 mg total) by mouth 2 (two) times daily. (Patient taking differently: Take 50 mg by mouth at bedtime.) 60 tablet 1    traZODone (DESYREL) 50 MG tablet Take 50 mg by mouth at bedtime.      triamcinolone cream (KENALOG) 0.1 % Apply 1 Application topically 2 (  two) times daily. Right foot        Patient Stressors: Medication change or noncompliance   Other: Pt doesn't like her group home     Patient Strengths: Ability for insight  Motivation for treatment/growth   Treatment Modalities: Medication Management, Group therapy, Case management,  1 to 1 session with clinician, Psychoeducation, Recreational therapy.   Physician Treatment Plan for Primary Diagnosis: Major depressive disorder, recurrent episode, severe (HCC) Long Term Goal(s):     Short Term Goals:    Medication Management: Evaluate patient's response, side effects, and tolerance of medication regimen.  Therapeutic Interventions: 1 to 1 sessions, Unit Group sessions and Medication administration.  Evaluation of Outcomes: Not Met  Physician Treatment Plan for Secondary Diagnosis: Principal Problem:   Major depressive disorder, recurrent episode, severe (HCC)  Long Term Goal(s):     Short Term Goals:       Medication Management: Evaluate patient's response, side effects, and tolerance of medication regimen.  Therapeutic Interventions: 1 to 1 sessions, Unit Group sessions and Medication administration.  Evaluation of Outcomes: Not Met   RN Treatment Plan for Primary Diagnosis: Major depressive disorder, recurrent episode, severe (HCC) Long Term Goal(s): Knowledge of disease and therapeutic regimen to maintain health will improve  Short Term Goals: Ability to remain free from injury will improve, Ability to verbalize frustration and anger appropriately will improve, Ability to demonstrate self-control, Ability to participate in decision making will improve, Ability to verbalize feelings will improve, Ability to disclose and discuss suicidal ideas, Ability to identify and develop effective coping behaviors will improve, and Compliance with prescribed medications will improve  Medication Management: RN will administer medications as ordered by provider, will assess and evaluate patient's response and provide  education to patient for prescribed medication. RN will report any adverse and/or side effects to prescribing provider.  Therapeutic Interventions: 1 on 1 counseling sessions, Psychoeducation, Medication administration, Evaluate responses to treatment, Monitor vital signs and CBGs as ordered, Perform/monitor CIWA, COWS, AIMS and Fall Risk screenings as ordered, Perform wound care treatments as ordered.  Evaluation of Outcomes: Not Met   LCSW Treatment Plan for Primary Diagnosis: Major depressive disorder, recurrent episode, severe (HCC) Long Term Goal(s): Safe transition to appropriate next level of care at discharge, Engage patient in therapeutic group addressing interpersonal concerns.  Short Term Goals: Engage patient in aftercare planning with referrals and resources, Increase social support, Increase ability to appropriately verbalize feelings, Increase emotional regulation, Facilitate acceptance of mental health diagnosis and concerns, and Increase skills for wellness and recovery  Therapeutic Interventions: Assess for all discharge needs, 1 to 1 time with Social worker, Explore available resources and support systems, Assess for adequacy in community support network, Educate family and significant other(s) on suicide prevention, Complete Psychosocial Assessment, Interpersonal group therapy.  Evaluation of Outcomes: Not Met   Progress in Treatment: Attending groups: No. Participating in groups: No. Taking medication as prescribed: Yes. Toleration medication: Yes. Family/Significant other contact made: No, will contact:  guardian, Lynn Ito. Patient understands diagnosis: Yes. Discussing patient identified problems/goals with staff: Yes. Medical problems stabilized or resolved: Yes. Denies suicidal/homicidal ideation: Yes. Issues/concerns per patient self-inventory: No. Other: none.  New problem(s) identified: No, Describe:  none identified.   New Short Term/Long Term Goal(s):  medication management for mood stabilization; elimination of SI thoughts; development of comprehensive mental wellness plan.   Patient Goals: "Just to get me out of here because I'm doing fine now."    Discharge Plan or Barriers: CSW will assist guardian/pt  with development of an appropriate aftercare/discharge plan.   Reason for Continuation of Hospitalization: Depression Medication stabilization Suicidal ideation  Estimated Length of Stay: 1-7 days  Last 3 [place] Suicide Severity Risk Score: Flowsheet Row Admission (Current) from 03/07/2023 in [facility] INPATIENT BE[facility]AL MEDICINE ED from 03/05/2023 in [facility] Emergency Department at [facility] ED from 01/30/2023 in [facility] Emergency Department at [facility]  C-SSRS RISK CATEGORY Low Risk High Risk High Risk       Last PHQ 2/9 Scores:    02/16/2023    9:49 AM 11/17/2022   10:53 AM 05/26/2022   12:03 PM  Depression screen PHQ 2/9  Decreased Interest 0 0 0  Down, Depressed, Hopeless 0 1 1  PHQ - 2 Score 0 1 1    Scribe for Treatment Team: Glenis Smoker, LCSW 03/08/2023 9:49 AM

## 2023-03-08 NOTE — Group Note (Signed)
Recreation Therapy Group Note   Group Topic:Healthy Support Systems  Group Date: 03/08/2023 Start Time: 1000 End Time: 1100 Facilitators: Rosina Lowenstein, LRT, CTRS Location:  Craft Room  Group Description: Straw Bridge.  Patients were given 10 plastic drinking straws and an equal length of masking tape. Using the materials provided, patients were instructed to build a free-standing bridge-like structure to suspend an everyday item (ex: deck of cards) off the floor or table surface. 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 LRT facilitated post-activity discussion reviewing the importance of having strong and healthy support systems in our lives. LRT discussed how the people in our lives serve as the tape and the deck of cards we placed on top of our straw structure are the stressors we face in daily life. LRT and pts discussed what happens in our life when things get too heavy for [place], and we don't have strong supports outside of the hospital. Pt shared 2 of their healthy supports in their life aloud in the group.    Goal Area(s) Addressed:  Patient will identify 2 healthy supports in their life. Patient will identify skills to successfully complete activity. Patient will identify correlation of this activity to life post-discharge.  Patient will work on Product manager.    Affect/Mood: N/A   Participation Level: Did not attend    Clinical Observations/Individualized Feedback: Crystal Black did not attend group.  Plan: Continue to engage patient in RT group sessions 2-3x/week.   Rosina Lowenstein, LRT, CTRS 03/08/2023 11:09 AM

## 2023-03-08 NOTE — Group Note (Signed)
LCSW Group Therapy Note  Group Date: 03/08/2023 Start Time: 1330 End Time: 1430   Type of Therapy and Topic:  Group Therapy: Anger Cues and Responses  Participation Level:  Active   Description of Group:   In this group, patients learned how to recognize the physical, cognitive, emotional, and behavioral responses they have to anger-provoking situations.  They identified a recent time they became angry and how they reacted.  They analyzed how their reaction was possibly beneficial and how it was possibly unhelpful.  The group discussed a variety of healthier coping skills that could help with such a situation in the future.  Focus was placed on how helpful it is to recognize the underlying emotions to our anger, because working on those can lead to a more permanent solution as well as our ability to focus on the important rather than the urgent.  Therapeutic Goals: Patients will remember their last incident of anger and how they felt emotionally and physically, what their thoughts were at the time, and how they behaved. Patients will identify how their behavior at that time worked for them, as well as how it worked against them. Patients will explore possible new behaviors to use in future anger situations. Patients will learn that anger itself is normal and cannot be eliminated, and that healthier reactions can assist with resolving conflict rather than worsening situations.  Summary of Patient Progress:   Patient was active during the group. She shared a recent incident that led to trauma.  Patient wasn't able to link it to the subject that was being discussed, however stated that she wanted to share her story with others.  She demonstrated poor insight into the subject matter, was respectful of peers, and participated throughout the entire session.  Therapeutic Modalities:   Cognitive Behavioral Therapy    Harden Mo, LCSW 03/08/2023  3:33 PM

## 2023-03-09 DIAGNOSIS — F333 Major depressive disorder, recurrent, severe with psychotic symptoms: Secondary | ICD-10-CM | POA: Diagnosis not present

## 2023-03-09 LAB — URINE CULTURE: Culture: >=100000 — AB

## 2023-03-09 NOTE — Plan of Care (Signed)
Pt denies SI/HI/AVH, compliant with procedures on the unit  Problem: Education: Goal: Knowledge of General Education information will improve Description: Including pain rating scale, medication(s)/side effects and non-pharmacologic comfort measures Outcome: Progressing   Problem: Health Behavior/Discharge Planning: Goal: Ability to manage health-related needs will improve Outcome: Progressing   Problem: Clinical Measurements: Goal: Ability to maintain clinical measurements within normal limits will improve Outcome: Progressing Goal: Will remain free from infection Outcome: Progressing Goal: Diagnostic test results will improve Outcome: Progressing   Problem: Activity: Goal: Risk for activity intolerance will decrease Outcome: Progressing   Problem: Nutrition: Goal: Adequate nutrition will be maintained Outcome: Progressing   Problem: Coping: Goal: Level of anxiety will decrease Outcome: Progressing   Problem: Elimination: Goal: Will not experience complications related to bowel motility Outcome: Progressing   Problem: Safety: Goal: Ability to remain free from injury will improve Outcome: Progressing   Problem: Education: Goal: Knowledge of  General Education information/materials will improve Outcome: Progressing Goal: Emotional status will improve Outcome: Progressing Goal: Mental status will improve Outcome: Progressing Goal: Verbalization of understanding the information provided will improve Outcome: Progressing   Problem: Activity: Goal: Interest or engagement in activities will improve Outcome: Progressing Goal: Sleeping patterns will improve Outcome: Progressing   Problem: Coping: Goal: Ability to verbalize frustrations and anger appropriately will improve Outcome: Progressing Goal: Ability to demonstrate self-control will improve Outcome: Progressing   Problem: Health Behavior/Discharge Planning: Goal: Identification of resources  available to assist in meeting health care needs will improve Outcome: Progressing Goal: Compliance with treatment plan for underlying cause of condition will improve Outcome: Progressing   Problem: Physical Regulation: Goal: Ability to maintain clinical measurements within normal limits will improve Outcome: Progressing   Problem: Safety: Goal: Periods of time without injury will increase Outcome: Progressing

## 2023-03-09 NOTE — Group Note (Signed)
Date:  03/09/2023 Time:  5:18 PM  Group Topic/Focus:  Self Care:   The focus of this group is to help patients understand the importance of self-care in order to improve or restore emotional, physical, spiritual, interpersonal, and financial health.    Participation Level:  Active  Participation Quality:  Appropriate  Affect:  Appropriate  Cognitive:  Appropriate  Insight: Appropriate  Engagement in Group:  Engaged  Modes of Intervention:  Activity  Additional Comments:    Edithe Dobbin 03/09/2023, 5:18 PM

## 2023-03-09 NOTE — Progress Notes (Signed)
During snack pt was exposed to peanut butter and she is allergic. Benadryl injection given. Pt being monitored

## 2023-03-09 NOTE — Group Note (Signed)
Date:  03/09/2023 Time:  10:37 AM  Group Topic/Focus:  Activity Group:  Focus of the group was to encourage activities such as coloring, playing cards or just having conversations for the benefit of mental health treatment.    Participation Level:  Active  Participation Quality:  Appropriate  Affect:  Appropriate  Cognitive:  Appropriate  Insight: Appropriate  Engagement in Group:  Engaged  Modes of Intervention:  Activity  Additional Comments:    Crystal Black 03/09/2023, 10:37 AM

## 2023-03-09 NOTE — Plan of Care (Signed)
Assumed care of patient this am, she presented pleasant and cooperative, affect bright and cheerful. Pt. Reported feeling "a little sad b/c she is not able to go to another group home as been promised yesterday. Pt was compliant with taking her medications,attended groups, denied side effects and denied thoughts of self harm nor harm to others.Pt also reported having a BM after more than a few days. Will continue to monitor for safety and support according to plan of care.

## 2023-03-09 NOTE — BHH Counselor (Signed)
CSW attempted contact with legal guardian, [name] [phone]). Contact unable to be established but HIPAA compliant voicemail left with contact information for follow through.   Vilma Meckel. Algis Greenhouse, MSW, LCSW, LCAS 03/09/2023 4:06 PM

## 2023-03-09 NOTE — Group Note (Signed)
Recreation Therapy Group Note   Group Topic:Relaxation  Group Date: 03/09/2023 Start Time: 1000 End Time: 1050 Facilitators: Rosina Lowenstein, LRT, CTRS Location:  Craft Room  Group Description: PMR (Progressive Muscle Relaxation). LRT asks patients their current level of stress/anxiety from 1-10, with 10 being the highest. LRT educates patients on what PMR is and the benefits that come from it. Patients are asked to sit with their feet flat on the floor while sitting up and all the way back in their chair, if possible. LRT and pts follow a prompt through a speaker that requires you to tense and release different muscles in their body and focus on their breathing. During session, lights are off and soft music is being played. Pts are given a stress ball to use if needed. At the end of the prompt, LRT asks patients to rank their current levels of stress/anxiety from 1-10, 10 being the highest.   Goal Area(s) Addressed:  Patients will be able to describe progressive muscle relaxation.  Patient will practice using relaxation technique. Patient will identify a new coping skill.  Patient will follow multistep directions to reduce anxiety and stress.   Affect/Mood: Appropriate   Participation Level: Active and Engaged   Participation Quality: Independent   Behavior: Appropriate, Calm, and Cooperative   Speech/Thought Process: Coherent   Insight: Fair   Judgement: Fair    Modes of Intervention: Activity   Patient Response to Interventions:  Attentive, Engaged, Interested , and Receptive   Education Outcome:  Acknowledges education   Clinical Observations/Individualized Feedback: Vasilisa was active in their participation of session activities and group discussion. Pt came late to group after meeting with SW. Pt did not complete the before or after rating sheet, however was active in following along with the prompt.    Plan: Continue to engage patient in RT group sessions  2-3x/week.   Rosina Lowenstein, LRT, CTRS 03/09/2023 11:01 AM

## 2023-03-09 NOTE — Plan of Care (Signed)
Pt compliant with procedures on the unit, denies SI/HI/AVH  Problem: Education: Goal: Knowledge of General Education information will improve Description: Including pain rating scale, medication(s)/side effects and non-pharmacologic comfort measures Outcome: Progressing   Problem: Health Behavior/Discharge Planning: Goal: Ability to manage health-related needs will improve Outcome: Progressing   Problem: Clinical Measurements: Goal: Ability to maintain clinical measurements within normal limits will improve Outcome: Progressing Goal: Will remain free from infection Outcome: Progressing Goal: Diagnostic test results will improve Outcome: Progressing   Problem: Activity: Goal: Risk for activity intolerance will decrease Outcome: Progressing   Problem: Nutrition: Goal: Adequate nutrition will be maintained Outcome: Progressing   Problem: Coping: Goal: Level of anxiety will decrease Outcome: Progressing   Problem: Elimination: Goal: Will not experience complications related to bowel motility Outcome: Progressing   Problem: Safety: Goal: Ability to remain free from injury will improve Outcome: Progressing   Problem: Skin Integrity: Goal: Risk for impaired skin integrity will decrease Outcome: Progressing   Problem: Education: Goal: Knowledge of [place] [facility] Education information/materials will improve Outcome: Progressing Goal: Emotional status will improve Outcome: Progressing Goal: Mental status will improve Outcome: Progressing Goal: Verbalization of understanding the information provided will improve Outcome: Progressing   Problem: Activity: Goal: Interest or engagement in activities will improve Outcome: Progressing Goal: Sleeping patterns will improve Outcome: Progressing   Problem: Coping: Goal: Ability to verbalize frustrations and anger appropriately will improve Outcome: Progressing Goal: Ability to demonstrate self-control will  improve Outcome: Progressing   Problem: Health Behavior/Discharge Planning: Goal: Identification of resources available to assist in meeting health care needs will improve Outcome: Progressing Goal: Compliance with treatment plan for underlying cause of condition will improve Outcome: Progressing   Problem: Physical Regulation: Goal: Ability to maintain clinical measurements within normal limits will improve Outcome: Progressing   Problem: Safety: Goal: Periods of time without injury will increase Outcome: Progressing

## 2023-03-09 NOTE — Group Note (Signed)
[facility] LCSW Group Therapy Note   Group Date: 03/09/2023 Start Time: 1315 End Time: 1415   Type of Therapy/Topic:  Group Therapy:  Balance in Life  Participation Level:  Active   Description of Group:    This group will address the concept of balance and how it feels and looks when one is unbalanced. Patients will be encouraged to process areas in their lives that are out of balance, and identify reasons for remaining unbalanced. Facilitators will guide patients utilizing problem- solving interventions to address and correct the stressor making their life unbalanced. Understanding and applying boundaries will be explored and addressed for obtaining  and maintaining a balanced life. Patients will be encouraged to explore ways to assertively make their unbalanced needs known to significant others in their lives, using other group members and facilitator for support and feedback.  Therapeutic Goals: Patient will identify two or more emotions or situations they have that consume much of in their lives. Patient will identify signs/triggers that life has become out of balance:  Patient will identify two ways to set boundaries in order to achieve balance in their lives:  Patient will demonstrate ability to communicate their needs through discussion and/or role plays  Summary of Patient Progress: Pt was present for the majority of the group process. She shared that music is something that can throw her off balance. Pt shared that she can be happy and listen to the wrong song and it will make her cry. She states that she likes to write music. Insight into the topic is questionable. However, she appeared open and receptive to feedback/comments from both her peers and there facilitator.    Therapeutic Modalities:   Cognitive Behavioral Therapy Solution-Focused Therapy Assertiveness Training   Glenis Smoker, LCSW
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
## 2023-03-09 NOTE — Group Note (Signed)
Date:  03/09/2023 Time:  10:30 PM  Group Topic/Focus:  Overcoming Stress:   The focus of this group is to define stress and help patients assess their triggers.    Participation Level:  Active  Participation Quality:  Appropriate and Attentive  Affect:  Appropriate  Cognitive:  Alert and Appropriate  Insight: Appropriate  Engagement in Group:  Developing/Improving  Modes of Intervention:  Education and Limit-setting  Additional Comments:     Copelyn Widmer 03/09/2023, 10:30 PM

## 2023-03-09 NOTE — Progress Notes (Signed)
Patient calm and pleasant during assessment. Pt denies SI/HI/AVH. Pt observed interacting appropriately with staff and peers on the unit. Pt compliant with medication administration per MD orders. Pt given education, support, and encouragement to be active in her treatment plan. Pt being monitored Q 15 minutes for safety per unit protocol, remains safe on the unit

## 2023-03-10 DIAGNOSIS — F333 Major depressive disorder, recurrent, severe with psychotic symptoms: Secondary | ICD-10-CM | POA: Diagnosis not present

## 2023-03-10 MED ORDER — MENTHOL 3 MG MT LOZG
1.0000 | LOZENGE | OROMUCOSAL | Status: DC | PRN
  Administered 2023-03-10 (×2): 3 mg via ORAL
  Filled 2023-03-10: qty 9

## 2023-03-10 NOTE — Group Note (Signed)
Date:  03/10/2023 Time:  10:28 PM  Group Topic/Focus:  Wrap-Up Group:   The focus of this group is to help patients review their daily goal of treatment and discuss progress on daily workbooks.     Participation Level:  Active  Participation Quality:  Appropriate and Attentive  Affect:  Appropriate  Cognitive:  Appropriate  Insight: Appropriate and Good  Engagement in Group:  Supportive  Modes of Intervention:  Support  Additional Comments:     [name] 03/10/2023, 10:28 PM
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
## 2023-03-10 NOTE — Plan of Care (Signed)
Problem: Education: Goal: Knowledge of General Education information will improve Description: Including pain rating scale, medication(s)/side effects and non-pharmacologic comfort measures Outcome: Progressing   Problem: Health Behavior/Discharge Planning: Goal: Ability to manage health-related needs will improve Outcome: Progressing   Problem: Clinical Measurements: Goal: Ability to maintain clinical measurements within normal limits will improve Outcome: Progressing Goal: Will remain free from infection Outcome: Progressing Goal: Diagnostic test results will improve Outcome: Progressing   Problem: Activity: Goal: Risk for activity intolerance will decrease Outcome: Progressing   Problem: Nutrition: Goal: Adequate nutrition will be maintained Outcome: Progressing   Problem: Coping: Goal: Level of anxiety will decrease Outcome: Progressing   Problem: Elimination: Goal: Will not experience complications related to bowel motility Outcome: Progressing   Problem: Safety: Goal: Ability to remain free from injury will improve Outcome: Progressing   Problem: Education: Goal: Knowledge of [facility] Education information/materials will improve Outcome: Progressing Goal: Emotional status will improve Outcome: Progressing Goal: Mental status will improve Outcome: Progressing Goal: Verbalization of understanding the information provided will improve Outcome: Progressing   Problem: Activity: Goal: Interest or engagement in activities will improve Outcome: Progressing Goal: Sleeping patterns will improve Outcome: Progressing   Problem: Coping: Goal: Ability to verbalize frustrations and anger appropriately will improve Outcome: Progressing Goal: Ability to demonstrate self-control will improve Outcome: Progressing   Problem: Health Behavior/Discharge Planning: Goal: Identification of resources available to assist in meeting health care needs will  improve Outcome: Progressing Goal: Compliance with treatment plan for underlying cause of condition will improve Outcome: Progressing   Problem: Physical Regulation: Goal: Ability to maintain clinical measurements within normal limits will improve Outcome: Progressing   Problem: Safety: Goal: Periods of time without injury will increase Outcome: Progressing

## 2023-03-10 NOTE — B[facility] Counselor (Signed)
CSW attempted contact with legal guardian, [name] [phone]) regarding consents and aftercare plans. Contact unable to be established but HIPAA compliant voicemail left with contact information for follow through.  Vilma Meckel. Algis Greenhouse, MSW, LCSW, LCAS 03/10/2023 2:19 PM

## 2023-03-10 NOTE — Progress Notes (Addendum)
Patient just came up to this writer stating "I think I might be pregnant. I took a pregnancy test at home and it said positive. I went and got another test and it said positive, but when I got here, it said negative. So, I don't know what's going on". Patient is asking for a pregnancy test. This Clinical research associate only sees a urine pregnancy test from August 2024. MD notified via secure chat.
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
## 2023-03-10 NOTE — Group Note (Signed)
Recreation Therapy Group Note   Group Topic:Leisure Education  Group Date: 03/10/2023 Start Time: 1000 End Time: 1100 Facilitators: Rosina Lowenstein, LRT, CTRS Location:  Craft Room  Group Description: Leisure. Patients were given the option to choose from singing karaoke, coloring mandalas, using oil pastels, journaling, or playing with play-doh. LRT and pts discussed the meaning of leisure, the importance of participating in leisure during their free time/when they're outside of the hospital, as well as how our leisure interests can also serve as coping skills.    Goal Area(s) Addressed:  Patient will identify a current leisure interest.  Patient will learn the definition of "leisure". Patient will practice making a positive decision. Patient will have the opportunity to try a new leisure activity. Patient will communicate with peers and LRT.    Affect/Mood: N/A   Participation Level: Did not attend    Clinical Observations/Individualized Feedback: Crystal Black did not attend group.  Plan: Continue to engage patient in RT group sessions 2-3x/week.   Rosina Lowenstein, LRT, CTRS 03/10/2023 11:13 AM

## 2023-03-10 NOTE — Group Note (Signed)
Date:  03/10/2023 Time:  7:16 PM  Group Topic/Focus:  Activity Group:  The purpose of the group was to hold a card game and communicate with each other to talk about our feelings.    Participation Level:  Active  Participation Quality:  Appropriate  Affect:  Appropriate  Cognitive:  Appropriate  Insight: Appropriate  Engagement in Group:  Engaged  Modes of Intervention:  Activity  Additional Comments:    Mary Sella Rudolpho Claxton 03/10/2023, 7:16 PM

## 2023-03-10 NOTE — Group Note (Unsigned)
Date:  03/10/2023 Time:  1:58 AM  Group Topic/Focus:  Spirituality:   The focus of this group is to discuss how one's spirituality can aide in recovery.     Participation Level:  {BHH PARTICIPATION QIHKV:42595}  Participation Quality:  {BHH PARTICIPATION QUALITY:22265}  Affect:  {BHH AFFECT:22266}  Cognitive:  {BHH COGNITIVE:22267}  Insight: {BHH Insight2:20797}  Engagement in Group:  {BHH ENGAGEMENT IN GLOVF:64332}  Modes of Intervention:  {BHH MODES OF INTERVENTION:22269}  Additional Comments:  ***  Lenore Cordia 03/10/2023, 1:58 AM

## 2023-03-10 NOTE — Progress Notes (Signed)
D- Patient alert and oriented. Patient presented in a preoccupied, but pleasant mood on assessment reporting that she slept "good" last night and had complaints of a sore throat, in which she requested something to help with relief. Patient denied depression, but endorsed anxiety, rating it an "8/10", stating that "just going back to the group home, I feel unsafe", has her feeling this way. Patient also stated that "the new staff aren't doing the job right". Patient denies SI, HI, AVH at this time. Patient's goal for today, per her self-inventory, is "getting help", in which she will "be around positive people", in order to achieve her goal.  A- Scheduled medications administered to patient, per MD orders. Support and encouragement provided. New orders were obtained for PRN Cepacol lozenges. Routine safety checks conducted every 15 minutes. Patient informed to notify staff with problems or concerns.  R- No adverse drug reactions noted. Patient contracts for safety at this time. Patient compliant with medications and treatment plan. Patient receptive, calm, and cooperative. Patient interacts well with others on the unit. Patient remains safe at this time.   03/10/23 1200  Psych Admission Type (Psych Patients Only)  Admission Status Involuntary  Psychosocial Assessment  Patient Complaints Anxiety  Eye Contact Fair  Facial Expression Anxious;Worried  Affect Preoccupied  Training and development officer;Childlike  Motor Activity Slow  Appearance/Hygiene Unremarkable  Behavior Characteristics Cooperative;Appropriate to situation  Mood Preoccupied;Pleasant  Aggressive Behavior  Effect No apparent injury  Thought Process  Coherency Circumstantial  Content WDL  Delusions None reported or observed  Perception WDL  Hallucination None reported or observed  Judgment WDL  Confusion None  Danger to Self  Current suicidal ideation? Denies  Agreement Not to Harm Self Yes  Description of Agreement  Verbal  Danger to Others  Danger to Others None reported or observed

## 2023-03-10 NOTE — Progress Notes (Signed)
Patient refused scheduled Kenalog cream, stating that she still has some from a previous administration.

## 2023-03-11 ENCOUNTER — Encounter: Payer: Self-pay | Admitting: Psychiatry

## 2023-03-11 DIAGNOSIS — F332 Major depressive disorder, recurrent severe without psychotic features: Secondary | ICD-10-CM

## 2023-03-11 MED ORDER — NITROFURANTOIN MONOHYD MACRO 100 MG PO CAPS
100.0000 mg | ORAL_CAPSULE | Freq: Two times a day (BID) | ORAL | Status: DC
  Administered 2023-03-11 – 2023-03-13 (×4): 100 mg via ORAL
  Filled 2023-03-11 (×5): qty 1

## 2023-03-11 NOTE — Progress Notes (Signed)
Patient alert and oriented x 4, affect is blunted, she was noted irritable earlier in the shift, patient denies SI/HI/AVH and ws offered emotional support. 15 minutes safety checks maintained.

## 2023-03-11 NOTE — Progress Notes (Signed)
I assumed care for Ms Secoy at about 08:00. She was resting in her bed, reports that she was nauseated earlier and did not want breakfast,vitals rechecked, wnl for patient, she reports thinking she might be pregnant, this is not a new concern as same has been documented from previous assessment. Gingerale and light snack provided. She otherwise denied any new pain, denied any avh/hi/si at the time of my assessment, she attended groups, ate lunch, provider Molli Knock made aware of patients concerns regarding pregnancy. She is being monitored as ordered.
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
## 2023-03-11 NOTE — Plan of Care (Signed)
  Problem: Safety: Goal: Ability to remain free from injury will improve Outcome: Progressing   

## 2023-03-11 NOTE — Group Note (Signed)
Date:  03/11/2023 Time:  4:51 PM  Group Topic/Focus:  Activity Group:  The focus of this group is to encourage patients to go outside to the courtyard to assist with their mental health to get some fresh air and exercise.     Participation Level:  Active  Participation Quality:  Appropriate  Affect:  Appropriate  Cognitive:  Appropriate  Insight: Appropriate  Engagement in Group:  Engaged  Modes of Intervention:  Activity  Additional Comments:    Crystal Black 03/11/2023, 4:51 PM

## 2023-03-11 NOTE — Plan of Care (Signed)

## 2023-03-12 DIAGNOSIS — F332 Major depressive disorder, recurrent severe without psychotic features: Secondary | ICD-10-CM | POA: Diagnosis not present

## 2023-03-12 NOTE — Group Note (Signed)
Date:  03/12/2023 Time:  5:33 PM  Group Topic/Focus:  Outdoor recreation Art therapy : help patients express themselves more freely, improve their mental health, and improve interpersonal relationships.    Participation Level:  Active  Participation Quality:  Appropriate and Attentive  Affect:  Appropriate and Excited  Cognitive:  Alert, Appropriate, and Oriented  Insight: Appropriate and Improving  Engagement in Group:  Developing/Improving and Engaged  Modes of Intervention:  Activity, Discussion, Rapport Building, and Socialization  Additional Comments:    Rosaura Carpenter 03/12/2023, 5:33 PM

## 2023-03-12 NOTE — Progress Notes (Signed)
Pt denies SI HI AVH but endorses depression and anxiety.  Pt slept late due to medications.  Pt was calm and cooperative and compliant with medications.  She participated with other patients and in unit activities.  Pt complained of redness on right ankle and will discuss with provider tomorrow.  Continued monitoring for safety.  03/12/23 1600  Psych Admission Type (Psych Patients Only)  Admission Status Involuntary  Psychosocial Assessment  Patient Complaints Anxiety  Eye Contact Fair  Facial Expression Anxious  Affect Preoccupied  Speech Soft  Interaction Assertive  Motor Activity Slow  Appearance/Hygiene Unremarkable  Behavior Characteristics Cooperative;Calm  Mood Pleasant  Thought Process  Coherency WDL  Content WDL  Delusions None reported or observed  Perception WDL  Hallucination None reported or observed  Judgment WDL  Confusion None  Danger to Self  Current suicidal ideation? Denies  Danger to Others  Danger to Others None reported or observed

## 2023-03-12 NOTE — Plan of Care (Signed)
  Problem: Education: Goal: Emotional status will improve Outcome: Progressing   Problem: Coping: Goal: Level of anxiety will decrease Outcome: Progressing

## 2023-03-12 NOTE — Group Note (Signed)
LCSW Group Therapy Note  Group Date: 03/12/2023 Start Time: 1315 End Time: 1355   Type of Therapy and Topic:   Group Therapy - Coping Skills  Participation Level:  Active   Description of Group The focus of this group was to determine what unhealthy coping techniques typically are used by group members and what healthy coping techniques would be helpful in coping with various problems. Patients were guided in becoming aware of the differences between healthy and unhealthy coping techniques. Patients were asked to identify 2-3 healthy coping skills they would like to learn to use more effectively.  Therapeutic Goals Patients learned that coping is what human beings do all day long to deal with various situations in their lives Patients defined and discussed healthy vs unhealthy coping techniques Patients identified their preferred coping techniques and identified whether these were healthy or unhealthy Patients determined 2-3 healthy coping skills they would like to become more familiar with and use more often. Patients provided support and ideas to each other   Summary of Patient Progress:  The patient attended group. Patient proved open to input from peers and feedback from [facility]. Patient demonstrated insight into the subject matter, was respectful of peers, and participated throughout the entire session. The patient participated during the icebreaker. The patient stated that she uses distraction techniques like painting while listening to music. The stated that going to the studio and writing music is a way she releases her emotions.      Marshell Levan, LCSWA 03/12/2023  2:09 PM

## 2023-03-12 NOTE — Group Note (Signed)
Date:  03/12/2023 Time:  8:34 PM  Group Topic/Focus:  Wrap-Up Group:   The focus of this group is to help patients review their daily goal of treatment and discuss progress on daily workbooks.    Participation Level:  Active  Participation Quality:  Appropriate and Attentive  Affect:  Appropriate  Cognitive:  Alert and Appropriate  Insight: Good  Engagement in Group:  Developing/Improving  Modes of Intervention:  Discussion, Rapport Building, and Socialization  Additional Comments:     Zaryia Markel 03/12/2023, 8:34 PM
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
## 2023-03-12 NOTE — Progress Notes (Signed)
Patient alert and oriented x 4, affect is blunted, she appears less anxious interacting appropriately with peers and staff.  During the shift patient abruptly ran to the nursing station frantic and anxious yelling " I can't breathe " she explained another patient was eating peanuts close to her and she is allergic to it . Immediately patient was given IM benadryl for itching/ allergy and she expressed relief. Respiration is even non labored without use of accessory muscles. Patient denies  SI/HI/AVH and was offered emotional support.15 minutes safety checks maintained will continue to observe closely.

## 2023-03-13 DIAGNOSIS — F333 Major depressive disorder, recurrent, severe with psychotic symptoms: Secondary | ICD-10-CM | POA: Diagnosis not present

## 2023-03-13 LAB — HCG, QUANTITATIVE, PREGNANCY: hCG, Beta Chain, Quant, S: <1 m[IU]/mL (ref <5)

## 2023-03-13 NOTE — Group Note (Signed)
Recreation Therapy Group Note   Group Topic:Goal Setting  Group Date: 03/13/2023 Start Time: 1000 End Time: 1100 Facilitators: Rosina Lowenstein, LRT, CTRS Location:  Craft Room  Group Description: Scientist, research (physical sciences). Patients were given many different magazines, a glue stick, markers, and a piece of cardstock paper. LRT and pts discussed the importance of having goals in life. LRT and pts discussed the difference between short-term and long-term goals, as well as what a SMART goal is. LRT encouraged pts to create a vision board, with images they picked and then cut out with safety scissors from the magazine, for themselves, that capture their short and long-term goals. LRT encouraged pts to show and explain their vision board to the group.   Goal Area(s) Addressed:  Patient will gain knowledge of short vs. long term goals.  Patient will identify goals for themselves. Patient will practice setting SMART goals. Patient will verbalize their goals to LRT and peers.   Affect/Mood: Appropriate   Participation Level: Active and Engaged   Participation Quality: Independent   Behavior: Appropriate, Calm, and Cooperative   Speech/Thought Process: Coherent   Insight: Fair   Judgement: Good   Modes of Intervention: Activity   Patient Response to Interventions:  Attentive, Interested , Receptive, and Resistant    Education Outcome:  Acknowledges education   Clinical Observations/Individualized Feedback: Crystal Black  was active in their participation of session activities and group discussion. Pt identified "I want to get back on my feet and be my own guardian again. I also want to get therapy going" as her goals. Pt came late to group, however joined with no issue. Pt interacted well with LRT and peers duration of session.   Plan: Continue to engage patient in RT group sessions 2-3x/week.   Rosina Lowenstein, LRT, CTRS 03/13/2023 11:44 AM
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
## 2023-03-13 NOTE — Plan of Care (Signed)
  Problem: Education: Goal: Knowledge of General Education information will improve Description: Including pain rating scale, medication(s)/side effects and non-pharmacologic comfort measures Outcome: Adequate for Discharge   Problem: Health Behavior/Discharge Planning: Goal: Ability to manage health-related needs will improve Outcome: Adequate for Discharge   Problem: Clinical Measurements: Goal: Ability to maintain clinical measurements within normal limits will improve Outcome: Adequate for Discharge Goal: Will remain free from infection Outcome: Adequate for Discharge Goal: Diagnostic test results will improve Outcome: Adequate for Discharge   Problem: Activity: Goal: Risk for activity intolerance will decrease Outcome: Adequate for Discharge   Problem: Nutrition: Goal: Adequate nutrition will be maintained Outcome: Adequate for Discharge   Problem: Coping: Goal: Level of anxiety will decrease Outcome: Adequate for Discharge   Problem: Elimination: Goal: Will not experience complications related to bowel motility Outcome: Adequate for Discharge   Problem: Safety: Goal: Ability to remain free from injury will improve Outcome: Adequate for Discharge   Problem: Education: Goal: Knowledge of [place] Education information/materials will improve Outcome: Adequate for Discharge Goal: Emotional status will improve Outcome: Adequate for Discharge Goal: Mental status will improve Outcome: Adequate for Discharge Goal: Verbalization of understanding the information provided will improve Outcome: Adequate for Discharge   Problem: Activity: Goal: Interest or engagement in activities will improve Outcome: Adequate for Discharge Goal: Sleeping patterns will improve Outcome: Adequate for Discharge   Problem: Coping: Goal: Ability to verbalize frustrations and anger appropriately will improve Outcome: Adequate for Discharge Goal: Ability to demonstrate  self-control will improve Outcome: Adequate for Discharge   Problem: Health Behavior/Discharge Planning: Goal: Identification of resources available to assist in meeting health care needs will improve Outcome: Adequate for Discharge Goal: Compliance with treatment plan for underlying cause of condition will improve Outcome: Adequate for Discharge   Problem: Physical Regulation: Goal: Ability to maintain clinical measurements within normal limits will improve Outcome: Adequate for Discharge   Problem: Safety: Goal: Periods of time without injury will increase Outcome: Adequate for Discharge

## 2023-03-13 NOTE — [facility] Suicide Risk Assessment (Signed)
[facility] INPATIENT:  Family/Significant Other Suicide Prevention Education  Suicide Prevention Education:  Education Completed; [name], legal guardian, [phone] has been identified by the patient as the family member/significant other with whom the patient will be residing, and identified as the person(s) who will aid the patient in the event of a mental health crisis (suicidal ideations/suicide attempt).  With written consent from the patient, the family member/significant other has been provided the following suicide prevention education, prior to the and/or following the discharge of the patient.  The suicide prevention education provided includes the following: Suicide risk factors Suicide prevention and interventions National Suicide Hotline telephone number [facility] assessment telephone number [facility] Emergency Assistance 911 [facility] and/or Residential Mobile Crisis Unit telephone number  Request made of family/significant other to: Remove weapons (e.g., guns, rifles, knives), all items previously/currently identified as safety concern.   Remove drugs/medications (over-the-counter, prescriptions, illicit drugs), all items previously/currently identified as a safety concern.  The family member/significant other verbalizes understanding of the suicide prevention education information provided.  The family member/significant other agrees to remove the items of safety concern listed above.  Harden Mo 03/13/2023, 12:02 PM

## 2023-03-13 NOTE — BHH Suicide Risk Assessment (Signed)
[facility] Discharge Suicide Risk Assessment   Principal Problem: Severe episode of recurrent major depressive disorder, without psychotic features (HCC) Discharge Diagnoses: Principal Problem:   Severe episode of recurrent major depressive disorder, without psychotic features (HCC) Active Problems:   PTSD (post-traumatic stress disorder)   Total Time spent with patient: 1 hour  Musculoskeletal: Strength & Muscle Tone: within normal limits Gait & Station: normal Patient leans: N/A  Psychiatric Specialty Exam  Presentation  General Appearance:  Appropriate for Environment  Eye Contact: Good  Speech: Clear and Coherent  Speech Volume: Normal  Handedness: Right   Mood and Affect  Mood: Depressed  Duration of Depression Symptoms: Greater than two weeks  Affect: Congruent   Thought Process  Thought Processes: Coherent  Descriptions of Associations:Intact  Orientation:Full (Time, Place and Person)  Thought Content:Illogical  History of Schizophrenia/Schizoaffective disorder:No  Duration of Psychotic Symptoms:N/A  Hallucinations:No data recorded Ideas of Reference:None  Suicidal Thoughts:No data recorded Homicidal Thoughts:No data recorded  Sensorium  Memory: Immediate Good  Judgment: Poor  Insight: Poor   Executive Functions  Concentration: Good  Attention Span: Good  Recall: Good  Fund of Knowledge: Fair  Language: Good   Psychomotor Activity  Psychomotor Activity:No data recorded  Assets  Assets: Communication Skills   Sleep  Sleep:No data recorded   Blood pressure 108/72, pulse 81, temperature 98.2 F (36.8 C), temperature source Oral, resp. rate (!) 21, height 5\' 2"  (1.575 m), weight 80.2 kg, SpO2 97%. Body mass index is 32.34 kg/m.  Mental Status Per Nursing Assessment::   On Admission:  Suicidal ideation indicated by patient  Demographic Factors:  Caucasian  Loss Factors: NA  Historical  Factors: NA  Risk Reduction Factors:   NA  Continued Clinical Symptoms:  Personality Disorders:   Cluster B  Cognitive Features That Contribute To Risk:  None    Suicide Risk:  Minimal: No identifiable suicidal ideation.  Patients presenting with no risk factors but with morbid ruminations; may be classified as minimal risk based on the severity of the depressive symptoms   Follow-up Information     [facility].   Why: Your group home will schedule the follow up with your outpatient provider.                Plan Of Care/Follow-up recommendations: As Above   Sarina Ill, DO 03/13/2023, 2:29 PM

## 2023-03-13 NOTE — BHH Counselor (Signed)
CSW spoke with the patient's guardian.  Guardian reports that patient can return to the group home. She provided verbal permission for CSW to speak with patients group home owner [place].   CSW contacted [name] who confirmed that patient can return to the home.  She further reports that the patient will follow up with the in-house provider at the group home, declining CSW assistance.   She reports that she will pick patient up after 3pm.  [name], MSW, LCSW 03/13/2023 12:08 PM

## 2023-03-13 NOTE — Group Note (Signed)
Date:  03/13/2023 Time:  10:05 AM  Group Topic/Focus:  Goals Group:   The focus of this group is to help patients establish daily goals to achieve during treatment and discuss how the patient can incorporate goal setting into their daily lives to aide in recovery.    Participation Level:  Did Not Attend   Lynelle Smoke [facility] 03/13/2023, 10:05 AM

## 2023-03-13 NOTE — BH IP Treatment Plan (Addendum)
Interdisciplinary Treatment and Diagnostic Plan Update  03/13/2023 Time of Session: 10:00 Crystal Black MRN: [id number]  Principal Diagnosis: Severe episode of recurrent major depressive disorder, without psychotic features (HCC)  Secondary Diagnoses: Principal Problem:   Severe episode of recurrent major depressive disorder, without psychotic features (HCC) Active Problems:   PTSD (post-traumatic stress disorder)   Current Medications:  Current Facility-Administered Medications  Medication Dose Route Frequency Provider Last Rate Last Admin   acetaminophen (TYLENOL) tablet 650 mg  650 mg Oral Q6H PRN Bennett, Christal H, NP       albuterol (PROVENTIL) (2.5 MG/3ML) 0.083% nebulizer solution 2.5 mg  2.5 mg Inhalation Q4H PRN Bennett, Christal H, NP       alum & mag hydroxide-simeth (MAALOX/MYLANTA) 200-200-20 MG/5ML suspension 30 mL  30 mL Oral Q4H PRN Bennett, Christal H, NP   30 mL at 03/08/23 1428   benztropine (COGENTIN) tablet 0.5 mg  0.5 mg Oral QHS Bennett, Christal H, NP   0.5 mg at 03/12/23 2152   darunavir-cobicistat (PREZCOBIX) 800-150 MG per tablet 1 tablet  1 tablet Oral Q breakfast Bennett, Christal H, NP   1 tablet at 03/12/23 1121   diphenhydrAMINE (BENADRYL) capsule 50 mg  50 mg Oral TID PRN Willeen Cass, Christal H, NP   50 mg at 03/13/23 0024   Or   diphenhydrAMINE (BENADRYL) injection 50 mg  50 mg Intramuscular TID PRN Bennett, Christal H, NP   50 mg at 03/11/23 2100   docusate sodium (COLACE) capsule 300 mg  300 mg Oral Daily Bennett, Christal H, NP   300 mg at 03/12/23 1123   dolutegravir (TIVICAY) tablet 50 mg  50 mg Oral Daily Bennett, Christal H, NP   50 mg at 03/12/23 1122   famotidine (PEPCID) tablet 20 mg  20 mg Oral Daily PRN Bennett, Christal H, NP       FLUoxetine (PROZAC) capsule 40 mg  40 mg Oral QHS Bennett, Christal H, NP   40 mg at 03/12/23 2152   hydrOXYzine (ATARAX) tablet 25 mg  25 mg Oral TID PRN Willeen Cass, Christal H, NP   25 mg at 03/10/23 2034    linaclotide (LINZESS) capsule 290 mcg  290 mcg Oral QAC breakfast Bennett, Christal H, NP   290 mcg at 03/12/23 1122   magnesium hydroxide (MILK OF MAGNESIA) suspension 30 mL  30 mL Oral Daily PRN Bennett, Christal H, NP   30 mL at 03/08/23 2136   menthol-cetylpyridinium (CEPACOL) lozenge 3 mg  1 lozenge Oral PRN Sarina Ill, DO   3 mg at 03/10/23 1748   methocarbamol (ROBAXIN) tablet 500 mg  500 mg Oral Daily PRN Bennett, Christal H, NP       montelukast (SINGULAIR) tablet 10 mg  10 mg Oral Daily Bennett, Christal H, NP   10 mg at 03/12/23 1122   nicotine polacrilex (NICORETTE) gum 2 mg  2 mg Oral PRN [name], NP   2 mg at 03/12/23 2155   nitrofurantoin (macrocrystal-monohydrate) (MACROBID) capsule 100 mg  100 mg Oral Q12H Charm Rings, NP   100 mg at 03/12/23 2100   prazosin (MINIPRESS) capsule 5 mg  5 mg Oral QHS Bennett, Christal H, NP   5 mg at 03/12/23 2152   topiramate (TOPAMAX) tablet 50 mg  50 mg Oral QHS Bennett, Christal H, NP   50 mg at 03/12/23 2152   traZODone (DESYREL) tablet 50 mg  50 mg Oral QHS Bennett, Christal H, NP   50 mg at 03/12/23  2153   triamcinolone cream (KENALOG) 0.1 % cream 1 Application  1 Application Topical BID Bennett, Christal H, NP   1 Application at 03/11/23 1647   PTA Medications: Medications Prior to Admission  Medication Sig Dispense Refill Last Dose   albuterol (VENTOLIN HFA) 108 (90 Base) MCG/ACT inhaler Inhale 2 puffs into the lungs every 4 (four) hours as needed for wheezing or shortness of breath.      benztropine (COGENTIN) 0.5 MG tablet Take 1 tablet (0.5 mg total) by mouth 2 (two) times daily. (Patient taking differently: Take 0.5 mg by mouth at bedtime.) 60 tablet 1    darunavir-cobicistat (PREZCOBIX) 800-150 MG tablet Take 1 tablet by mouth daily with breakfast. Swallow whole. Do NOT crush, break or chew tablets. Take with food. 30 tablet 1    divalproex (DEPAKOTE) 500 MG DR tablet Take 1 tablet (500 mg total) by mouth  every 12 (twelve) hours. (Patient taking differently: Take 500 mg by mouth 3 (three) times daily.) 60 tablet 1    docusate sodium (COLACE) 100 MG capsule Take 300 mg by mouth daily.      dolutegravir (TIVICAY) 50 MG tablet Take 1 tablet (50 mg total) by mouth daily. 30 tablet 1    EPINEPHrine 0.3 mg/0.3 mL IJ SOAJ injection Inject 0.3 mg into the muscle as needed for anaphylaxis.      etonogestrel (NEXPLANON) 68 MG IMPL implant 1 each (68 mg total) by Subdermal route once for 1 dose. 1 each 0    famotidine (PEPCID) 20 MG tablet Take 20 mg by mouth daily as needed for heartburn.      Fiber Adult Gummies 2 g CHEW Chew 1 Units by mouth 2 (two) times daily.      FLUoxetine (PROZAC) 40 MG capsule Take 40 mg by mouth at bedtime.      glycerin adult 2 g suppository Place 1 suppository rectally as needed for constipation.      hydrOXYzine (ATARAX) 25 MG tablet Take 1 tablet (25 mg total) by mouth 3 (three) times daily as needed. 60 tablet 1    ibuprofen (ADVIL) 800 MG tablet Take 1 tablet (800 mg total) by mouth every 8 (eight) hours as needed for moderate pain. 15 tablet 0    INVEGA SUSTENNA 156 MG/ML SUSY injection Inject 234 mg into the muscle every 30 (thirty) days.      linaclotide (LINZESS) 290 MCG CAPS capsule Take 290 mcg by mouth daily before breakfast.      magnesium citrate SOLN Take 1 Bottle by mouth once.      methocarbamol (ROBAXIN) 500 MG tablet Take 500 mg by mouth daily as needed (back pain).      montelukast (SINGULAIR) 10 MG tablet Take 10 mg by mouth daily.      nicotine polacrilex (NICORETTE) 2 MG gum Take 2 mg by mouth as needed for smoking cessation.      prazosin (MINIPRESS) 5 MG capsule Take 1 capsule (5 mg total) by mouth at bedtime. 30 capsule 1    SODIUM FLUORIDE, DENTAL RINSE, 0.2 % SOLN Use as directed 10 mLs in the mouth or throat at bedtime.      topiramate (TOPAMAX) 50 MG tablet Take 1 tablet (50 mg total) by mouth 2 (two) times daily. (Patient taking differently: Take 50  mg by mouth at bedtime.) 60 tablet 1    traZODone (DESYREL) 50 MG tablet Take 50 mg by mouth at bedtime.      triamcinolone cream (KENALOG) 0.1 % Apply  1 Application topically 2 (two) times daily. Right foot       Patient Stressors: Medication change or noncompliance   Other: Pt doesn't like her group home     Patient Strengths: Ability for insight  Motivation for treatment/growth   Treatment Modalities: Medication Management, Group therapy, Case management,  1 to 1 session with clinician, Psychoeducation, Recreational therapy.   Physician Treatment Plan for Primary Diagnosis: Severe episode of recurrent major depressive disorder, without psychotic features (HCC) Long Term Goal(s): Improvement in symptoms so as ready for discharge   Short Term Goals: Ability to identify changes in lifestyle to reduce recurrence of condition will improve Ability to verbalize feelings will improve Ability to disclose and discuss suicidal ideas Ability to demonstrate self-control will improve Ability to identify and develop effective coping behaviors will improve Ability to maintain clinical measurements within normal limits will improve Compliance with prescribed medications will improve Ability to identify triggers associated with substance abuse/mental health issues will improve  Medication Management: Evaluate patient's response, side effects, and tolerance of medication regimen.  Therapeutic Interventions: 1 to 1 sessions, Unit Group sessions and Medication administration.  Evaluation of Outcomes: Adequate for Discharge  Physician Treatment Plan for Secondary Diagnosis: Principal Problem:   Severe episode of recurrent major depressive disorder, without psychotic features (HCC) Active Problems:   PTSD (post-traumatic stress disorder)  Long Term Goal(s): Improvement in symptoms so as ready for discharge   Short Term Goals: Ability to identify changes in lifestyle to reduce recurrence of  condition will improve Ability to verbalize feelings will improve Ability to disclose and discuss suicidal ideas Ability to demonstrate self-control will improve Ability to identify and develop effective coping behaviors will improve Ability to maintain clinical measurements within normal limits will improve Compliance with prescribed medications will improve Ability to identify triggers associated with substance abuse/mental health issues will improve     Medication Management: Evaluate patient's response, side effects, and tolerance of medication regimen.  Therapeutic Interventions: 1 to 1 sessions, Unit Group sessions and Medication administration.  Evaluation of Outcomes: Adequate for Discharge   RN Treatment Plan for Primary Diagnosis: Severe episode of recurrent major depressive disorder, without psychotic features (HCC) Long Term Goal(s): Knowledge of disease and therapeutic regimen to maintain health will improve  Short Term Goals: Ability to remain free from injury will improve, Ability to verbalize frustration and anger appropriately will improve, Ability to demonstrate self-control, Ability to participate in decision making will improve, Ability to verbalize feelings will improve, Ability to disclose and discuss suicidal ideas, Ability to identify and develop effective coping behaviors will improve, and Compliance with prescribed medications will improve  Medication Management: RN will administer medications as ordered by provider, will assess and evaluate patient's response and provide education to patient for prescribed medication. RN will report any adverse and/or side effects to prescribing provider.  Therapeutic Interventions: 1 on 1 counseling sessions, Psychoeducation, Medication administration, Evaluate responses to treatment, Monitor vital signs and CBGs as ordered, Perform/monitor CIWA, COWS, AIMS and Fall Risk screenings as ordered, Perform wound care treatments as  ordered.  Evaluation of Outcomes: Adequate for Discharge   LCSW Treatment Plan for Primary Diagnosis: Severe episode of recurrent major depressive disorder, without psychotic features (HCC) Long Term Goal(s): Safe transition to appropriate next level of care at discharge, Engage patient in therapeutic group addressing interpersonal concerns.  Short Term Goals: Engage patient in aftercare planning with referrals and resources, Increase social support, Increase ability to appropriately verbalize feelings, Increase emotional regulation, Facilitate acceptance  of mental health diagnosis and concerns, and Increase skills for wellness and recovery  Therapeutic Interventions: Assess for all discharge needs, 1 to 1 time with Social worker, Explore available resources and support systems, Assess for adequacy in community support network, Educate family and significant other(s) on suicide prevention, Complete Psychosocial Assessment, Interpersonal group therapy.  Evaluation of Outcomes: Adequate for Discharge   Progress in Treatment: Attending groups: Yes. and No. Participating in groups: Yes. Taking medication as prescribed: Yes. Toleration medication: Yes. Family/Significant other contact made: Yes, individual(s) contacted:  legal guardian, [name] [name]. Patient understands diagnosis: Yes. Discussing patient identified problems/goals with staff: Yes. Medical problems stabilized or resolved: Yes. Denies suicidal/homicidal ideation: Yes. Issues/concerns per patient self-inventory: No. Other: none.  New problem(s) identified: No, Describe:  none identified.   New Short Term/Long Term Goal(s): medication management for mood stabilization; elimination of SI thoughts; development of comprehensive mental wellness plan. Update 03/13/23: Discharge set for today.     Patient Goals: "Just to get me out of here because I'm doing fine now." Update 03/13/23: "Going home with good intentions."     Discharge  Plan or Barriers: CSW will assist guardian/pt with development of an appropriate aftercare/discharge plan. Update 03/13/23: Guardian has been contacted and is agreeable with discharge plans. CSW assisting with scheduling appointments and transportation arrangements.    Reason for Continuation of Hospitalization: Depression Medication stabilization Suicidal ideation   Estimated Length of Stay: 1-7 days Update 03/13/23: No changes at this time.  Last 3 [place] Suicide Severity Risk Score: Flowsheet Row Admission (Current) from 03/07/2023 in [facility] ED from 03/05/2023 in [facility] Emergency Department at [facility] ED from [facility]/30/2023 in [facility] Department at [facility]  C-SSRS RISK CATEGORY Low Risk High Risk High Risk       Last PHQ 2/9 Scores:    02/16/2023    9:49 AM 11/17/2022   10:53 AM 05/26/2022   12:03 PM  Depression screen PHQ 2/9  Decreased Interest 0 0 0  Down, Depressed, Hopeless 0 1 1  PHQ - 2 Score 0 1 1    Scribe for Treatment Team: Glenis Smoker, LCSW 03/13/2023 10:47 AM

## 2023-03-13 NOTE — Progress Notes (Signed)
  [facility] Adult Case Management Discharge Plan :  Will you be returning to the same living situation after discharge:  Yes,  pt to return to her group home.  At discharge, do you have transportation home?: Yes,  Group home to provide transportation. Do you have the ability to pay for your medications: Yes,  [employer] / [employer]  Release of information consent forms completed and in the chart;  Patient's signature needed at discharge.  Patient to Follow up at:  Follow-up Information     [facility].   Why: Your group home will schedule the follow up with your outpatient provider.                Next level of care provider has access to [facility] Link:no  Safety Planning and Suicide Prevention discussed: Yes,  SPE completed with patient and guardian.     Has patient been referred to the Quitline?: Patient refused referral for treatment  Patient has been referred for addiction treatment: No known substance use disorder.  Harden Mo, LCSW 03/13/2023, 12:00 PM

## 2023-03-13 NOTE — Progress Notes (Signed)
Patient ID: Crystal Black, female   DOB: 20-Jul-1995, 27 y.o.   MRN: [id number]  Discharge Note:  Patient denies SI/HI/AVH at this time. Discharge instructions, AVS, prescriptions, and transition record gone over with patient. Patient declined to fill out her Suicide Safety Plan. Patient agrees to comply with medication management, follow-up visit, and outpatient therapy. Patient belongings returned to patient. Patient questions and concerns addressed and answered. Patient ambulatory off unit. Patient discharged back to group home with group home staff member.

## 2023-03-13 NOTE — Plan of Care (Signed)
Problem: Clinical Measurements: Goal: Will remain free from infection Outcome: Progressing   Problem: Coping: Goal: Level of anxiety will decrease Outcome: Progressing

## 2023-03-13 NOTE — Progress Notes (Signed)
Patient is noted in the dayroom interacting with peers and staff appropriately no distress noted, patient denies SI/HI/AVH, she denies pain and discomfort, she was offered emotional support and encouragement to attend group, 15 safety checks maintained will continue to monitor closely.

## 2023-03-13 NOTE — Progress Notes (Signed)
D- Patient alert and oriented. Patient presents in a pleasant mood on assessment reporting that she slept good last night and initially had complaints of not feeling well, but perked up around lunch time. Patient denies SI, HI, AVH, and pain at this time. Patient also denies any signs/symptoms of depression and anxiety stating that overall, she is feeling "pretty good". Patient's goal for today, per her self-inventory, is "going home with good intentions".  A- Scheduled medications administered to patient, per MD orders. Support and encouragement provided. Routine safety checks conducted every 15 minutes. Patient informed to notify staff with problems or concerns.  R- No adverse drug reactions noted. Patient contracts for safety at this time. Patient compliant with medications and treatment plan. Patient receptive, calm, and cooperative. Patient interacts well with others on the unit. Patient remains safe at this time.

## 2023-03-13 NOTE — Progress Notes (Signed)
Patient stated that she was not feeling well, and did not want to take her scheduled medication at this time. NP was notified during progression rounds.
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
## 2023-03-26 IMAGING — CR DG CERVICAL SPINE COMPLETE 4+V
5 series · 5 of 5 positions shown · non-contrast
Comparison: None.

CLINICAL DATA: Neck pain

EXAM:
CERVICAL SPINE - COMPLETE 4+ VIEW

[c-spine lat]
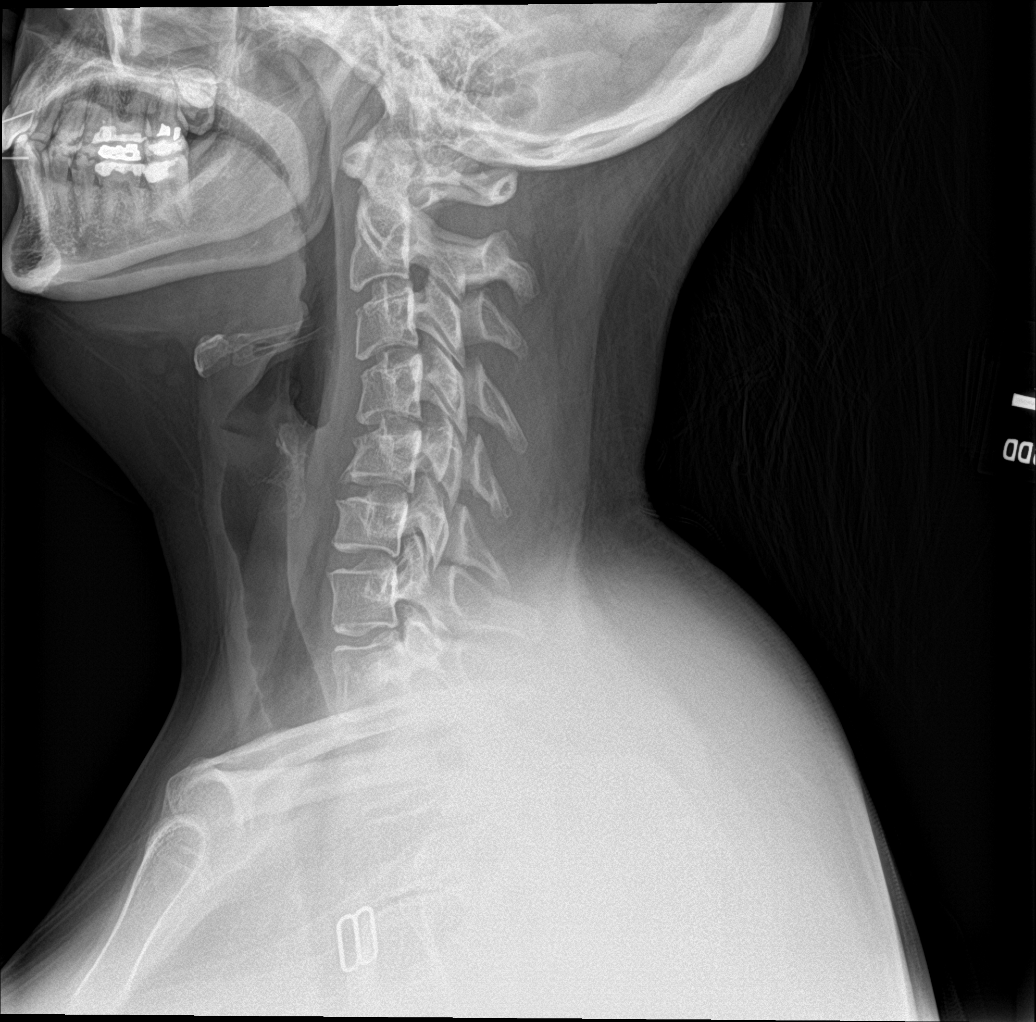

[c-spine obl (1 of 2)]
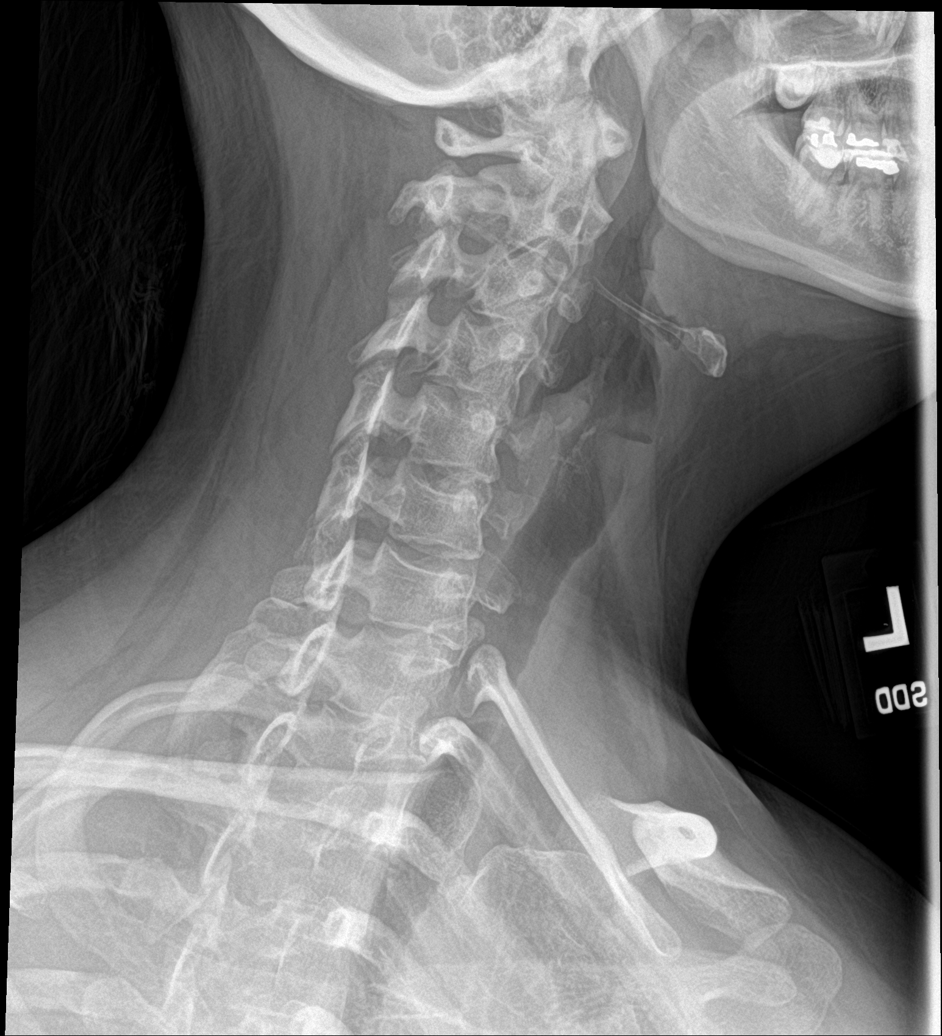

[c-spine obl (2 of 2)]
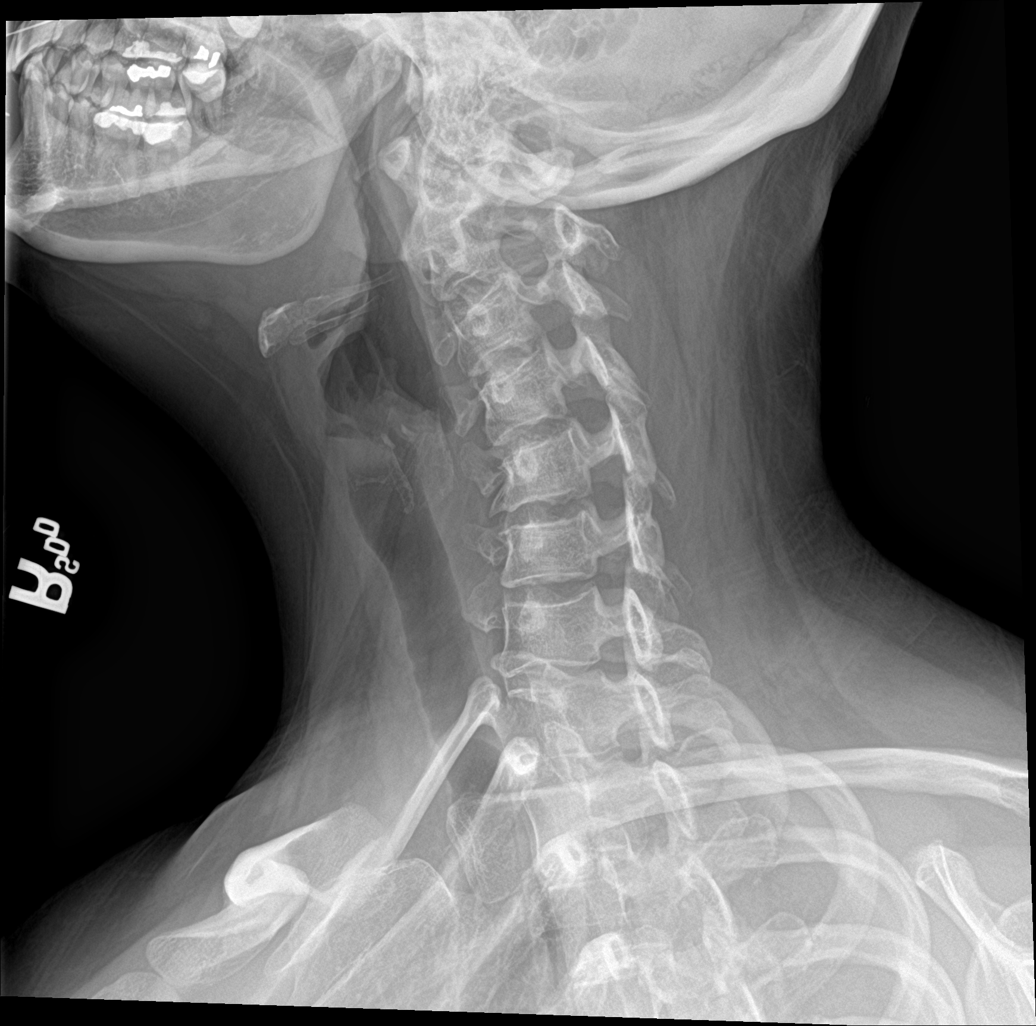

[c-spine ap]
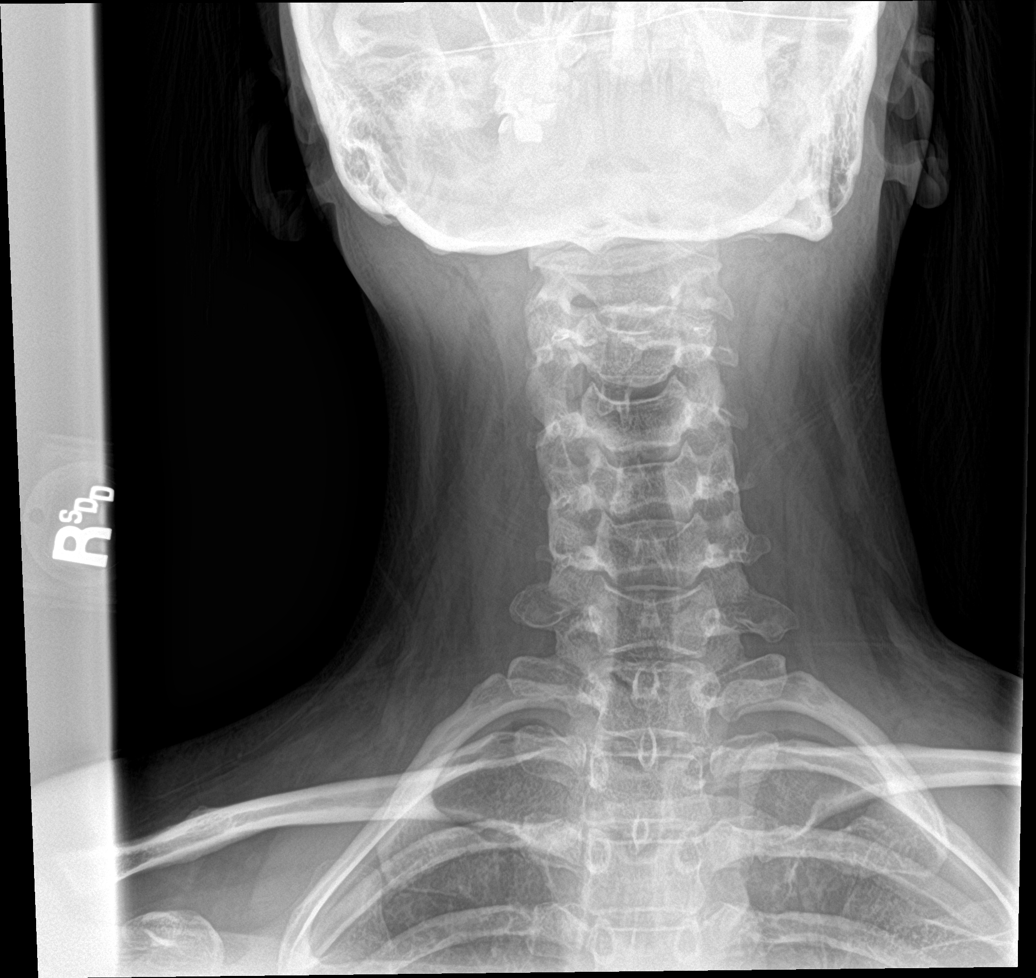

[c-spine open mouth]
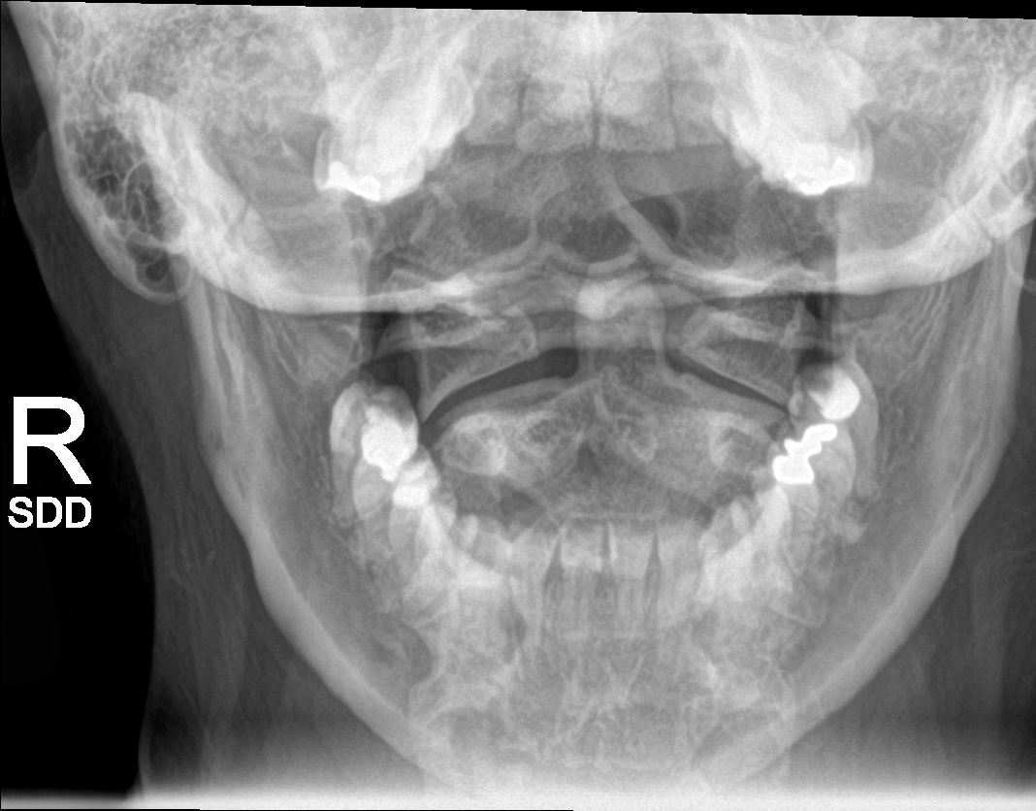

[5 of 5 positions shown; findings below may reference images not displayed]

FINDINGS: There is no evidence of cervical spine fracture or prevertebral soft
tissue swelling. Alignment is normal. No other significant bone
abnormalities are identified.
IMPRESSION: Negative cervical spine radiographs.

## 2023-04-20 ENCOUNTER — Ambulatory Visit (HOSPITAL_COMMUNITY)
Admission: EM | Admit: 2023-04-20 | Discharge: 2023-04-21 | Disposition: A | Discharge status: Other Acute Inpatient Hospital | Payer: MEDICAID | Attending: Psychiatry | Admitting: Psychiatry

## 2023-04-20 DIAGNOSIS — F319 Bipolar disorder, unspecified: Secondary | ICD-10-CM | POA: Insufficient documentation

## 2023-04-20 DIAGNOSIS — R45850 Homicidal ideations: Secondary | ICD-10-CM | POA: Insufficient documentation

## 2023-04-20 DIAGNOSIS — F209 Schizophrenia, unspecified: Secondary | ICD-10-CM | POA: Insufficient documentation

## 2023-04-20 DIAGNOSIS — R45851 Suicidal ideations: Secondary | ICD-10-CM | POA: Insufficient documentation

## 2023-04-20 DIAGNOSIS — R454 Irritability and anger: Secondary | ICD-10-CM | POA: Insufficient documentation

## 2023-04-20 LAB — POCT URINE DRUG SCREEN - MANUAL ENTRY (I-SCREEN)
POC Amphetamine UR: NOT DETECTED
POC Buprenorphine (BUP): NOT DETECTED
POC Cocaine UR: NOT DETECTED
POC Marijuana UR: NOT DETECTED
POC Methadone UR: NOT DETECTED
POC Methamphetamine UR: NOT DETECTED
POC Morphine: NOT DETECTED
POC Oxazepam (BZO): NOT DETECTED
POC Oxycodone UR: NOT DETECTED
POC Secobarbital (BAR): NOT DETECTED

## 2023-04-20 LAB — URINALYSIS, ROUTINE W REFLEX MICROSCOPIC
Bilirubin Urine: NEGATIVE
Glucose, UA: NEGATIVE mg/dL
Hgb urine dipstick: NEGATIVE
Ketones, ur: NEGATIVE mg/dL
Leukocytes,Ua: NEGATIVE
Nitrite: NEGATIVE
Protein, ur: NEGATIVE mg/dL
Specific Gravity, Urine: 1.029 (ref 1.005–1.030)
pH: 5 (ref 5.0–8.0)

## 2023-04-20 LAB — COMPREHENSIVE METABOLIC PANEL
ALT: 13 U/L (ref 0–44)
AST: 17 U/L (ref 15–41)
Albumin: 3.6 g/dL (ref 3.5–5.0)
Alkaline Phosphatase: 41 U/L (ref 38–126)
Anion gap: 10 (ref 5–15)
BUN: 18 mg/dL (ref 6–20)
CO2: 19 mmol/L — ABNORMAL LOW (ref 22–32)
Calcium: 8.8 mg/dL — ABNORMAL LOW (ref 8.9–10.3)
Chloride: 107 mmol/L (ref 98–111)
Creatinine, Ser: 0.83 mg/dL (ref 0.44–1.00)
GFR, Estimated: >60 mL/min (ref >60)
Glucose, Bld: 141 mg/dL — ABNORMAL HIGH (ref 70–99)
Potassium: 4.4 mmol/L (ref 3.5–5.1)
Sodium: 136 mmol/L (ref 135–145)
Total Bilirubin: 0.6 mg/dL (ref 0.3–1.2)
Total Protein: 6.4 g/dL — ABNORMAL LOW (ref 6.5–8.1)

## 2023-04-20 LAB — CBC WITH DIFFERENTIAL/PLATELET
Abs Immature Granulocytes: 0.02 10*3/uL (ref 0.00–0.07)
Basophils Absolute: 0 10*3/uL (ref 0.0–0.1)
Basophils Relative: 0 %
Eosinophils Absolute: 0 10*3/uL (ref 0.0–0.5)
Eosinophils Relative: 0 %
HCT: 30 % — ABNORMAL LOW (ref 36.0–46.0)
Hemoglobin: 9.9 g/dL — ABNORMAL LOW (ref 12.0–15.0)
Immature Granulocytes: 0 %
Lymphocytes Relative: 47 %
Lymphs Abs: 3.3 10*3/uL (ref 0.7–4.0)
MCH: 31 pg (ref 26.0–34.0)
MCHC: 33 g/dL (ref 30.0–36.0)
MCV: 94 fL (ref 80.0–100.0)
Monocytes Absolute: 0.7 10*3/uL (ref 0.1–1.0)
Monocytes Relative: 10 %
Neutro Abs: 3 10*3/uL (ref 1.7–7.7)
Neutrophils Relative %: 43 %
Platelets: 171 10*3/uL (ref 150–400)
RBC: 3.19 MIL/uL — ABNORMAL LOW (ref 3.87–5.11)
RDW: 13.2 % (ref 11.5–15.5)
WBC: 7.1 10*3/uL (ref 4.0–10.5)
nRBC: 0 % (ref 0.0–0.2)

## 2023-04-20 LAB — TSH: TSH: 3.849 u[IU]/mL (ref 0.350–4.500)

## 2023-04-20 LAB — ETHANOL: Alcohol, Ethyl (B): <10 mg/dL (ref <10)

## 2023-04-20 LAB — POC URINE PREG, ED: Preg Test, Ur: NEGATIVE

## 2023-04-20 MED ORDER — MAGNESIUM HYDROXIDE 400 MG/5ML PO SUSP: 30.0000 mL | Freq: Every day | ORAL | Status: DC | PRN

## 2023-04-20 MED ORDER — OLANZAPINE 10 MG PO TBDP: 10.0000 mg | ORAL_TABLET | Freq: Three times a day (TID) | ORAL | Status: DC | PRN

## 2023-04-20 MED ORDER — ACETAMINOPHEN 325 MG PO TABS: 650.0000 mg | ORAL_TABLET | Freq: Four times a day (QID) | ORAL | Status: DC | PRN

## 2023-04-20 MED ORDER — LORAZEPAM 1 MG PO TABS: 1.0000 mg | ORAL_TABLET | ORAL | Status: DC | PRN

## 2023-04-20 MED ORDER — ALUM & MAG HYDROXIDE-SIMETH 200-200-20 MG/5ML PO SUSP: 30.0000 mL | ORAL | Status: DC | PRN

## 2023-04-20 MED ORDER — ZIPRASIDONE MESYLATE 20 MG IM SOLR: 20.0000 mg | INTRAMUSCULAR | Status: DC | PRN
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
## 2023-04-20 NOTE — ED Notes (Signed)
Pt asleep at this hour. No apparent distress. RR even and unlabored. Monitored for safety.  
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
## 2023-04-20 NOTE — ED Provider Notes (Signed)
[facility] Continuous Assessment Admission H&P  Date: 04/21/23 Patient Name: Crystal Black MRN: [id number] Chief Complaint: suicidal and homicidal thoughts to hurt myself  Diagnoses:  Final diagnoses:  Suicidal ideation  Homicidal thoughts  Difficulty controlling anger    HPI: Crystal Black, 27 y/o female with a history of bipolar disorder, ADHD, PTSD, anxiety presented to [facility] voluntarily accompanied by her group home provider.  Per the patient she is suicidal and homicidal and she needs help because she is having thoughts of using a bunch of knives to hurt to people and she loses it and she will have blood all over herself patient also has intention to hurt herself with a knife.  Patient also stated she would drink chemical and according to report patient has tried drinking chemicals in the past.  I review of patient records show patient has been hospitalized on multiple occasions patient was recently discharged from inpatient at [facility] and prior to that patient was at [facility] for 4 months.  Patient currently residing on group home.  Patient does appear to be very distraught and depressed.  Patient currently takes medication however the patient did not know what medicine she takes and also they are in between providers.  Face-to-face observation of patient, patient is alert and oriented to person and place and time.  Patient does answer questions when asked appropriately.  Patient maintain minimal eye contact.  Patient mood is depressed and constricted affect is flat congruent with mood.  Patient endorsed suicidal ideations with plan to use a knife to cut herself patient endorsed homicidal ideation that she wanted to hurt to people and watch them bleed all over the place.  Patient does seem to be very angry and does not seem to be able to channel her anger into anything positive.  Patient denies AVH at this time but in the past does have auditory hallucination.  Patient denies alcohol use,  patient reports she vape and use tobacco products.  Patient denies any other illicit drug use at this time.  Patient does appear to be influenced by some internal stimuli even though she did not verbalize it.   Recommend inpatient admission when a bed becomes available at [facility]  Total Time spent with patient: 20 minutes  Musculoskeletal  Strength & Muscle Tone: within normal limits Gait & Station: normal Patient leans: N/A  Psychiatric Specialty Exam  Presentation General Appearance:  Casual  Eye Contact: Good  Speech: Clear and Coherent  Speech Volume: Normal  Handedness: Right   Mood and Affect  Mood: Anxious; Depressed; Euthymic  Affect: Congruent   Thought Process  Thought Processes: Coherent  Descriptions of Associations:Intact  Orientation:Full (Time, Place and Person)  Thought Content:WDL  Diagnosis of Schizophrenia or Schizoaffective disorder in past: Yes   Hallucinations:Hallucinations: None  Ideas of Reference:None  Suicidal Thoughts:Suicidal Thoughts: Yes, Active SI Active Intent and/or Plan: With Intent; With Plan  Homicidal Thoughts:Homicidal Thoughts: Yes, Passive HI Passive Intent and/or Plan: With Intent; With Plan   Sensorium  Memory: Immediate Fair  Judgment: Intact  Insight: Poor   Executive Functions  Concentration: Fair  Attention Span: Fair  Recall: [employer] of Knowledge: Fair  Language: Fair   Psychomotor Activity  Psychomotor Activity: Psychomotor Activity: Normal   Assets  Assets: Desire for Improvement; Social Support   Sleep  Sleep: Sleep: Fair Number of Hours of Sleep: 6   Nutritional Assessment (For OBS and FBC admissions only) Has the patient had a weight loss or gain  of 10 pounds or more in the last 3 months?: No Has the patient had a decrease in food intake/or appetite?: No Does the patient have dental problems?: No Does the patient have eating habits or behaviors that may be  indicators of an eating disorder including binging or inducing vomiting?: No Has the patient recently lost weight without trying?: 0 Has the patient been eating poorly because of a decreased appetite?: 0 Malnutrition Screening Tool Score: 0    Physical Exam HENT:     Head: Normocephalic.     Nose: Nose normal.  Eyes:     Pupils: Pupils are equal, round, and reactive to light.  Cardiovascular:     Rate and Rhythm: Normal rate.  Pulmonary:     Effort: Pulmonary effort is normal.  Musculoskeletal:        General: Normal range of motion.     Cervical back: Normal range of motion.  Neurological:     General: No focal deficit present.     Mental Status: She is alert.  Psychiatric:        Mood and Affect: Mood normal.        Behavior: Behavior normal.        Thought Content: Thought content normal.        Judgment: Judgment normal.    Review of Systems  Constitutional: Negative.   HENT: Negative.    Eyes: Negative.   Respiratory: Negative.    Cardiovascular: Negative.   Gastrointestinal: Negative.   Genitourinary: Negative.   Musculoskeletal: Negative.   Skin: Negative.   Neurological: Negative.   Psychiatric/Behavioral:  Positive for depression and suicidal ideas. The patient is nervous/anxious.     Blood pressure 111/78, pulse 74, temperature 98.3 F (36.8 C), resp. rate 18, SpO2 100%. There is no height or weight on file to calculate BMI.  Past Psychiatric History: Mdd , PTSD, Anxiety and bipolar dx   Is the patient at risk to self? Yes  Has the patient been a risk to self in the past 6 months? Yes .    Has the patient been a risk to self within the distant past? Yes   Is the patient a risk to others? Yes   Has the patient been a risk to others in the past 6 months? Yes   Has the patient been a risk to others within the distant past? Yes   Past Medical History: see chart   Family History: unknown  Social History: tobacco product  Last Labs:  Admission on  04/20/2023, Discharged on 04/21/2023  Component Date Value Ref Range Status   WBC 04/20/2023 7.1  4.0 - 10.5 K/uL Final   RBC 04/20/2023 3.19 (L)  3.87 - 5.11 MIL/uL Final   Hemoglobin 04/20/2023 9.9 (L)  12.0 - 15.0 g/dL Final   HCT 34/74/2595 30.0 (L)  36.0 - 46.0 % Final   MCV 04/20/2023 94.0  80.0 - 100.0 fL Final   MCH 04/20/2023 31.0  26.0 - 34.0 pg Final   MCHC 04/20/2023 33.0  30.0 - 36.0 g/dL Final   RDW 63/87/5643 13.2  11.5 - 15.5 % Final   Platelets 04/20/2023 171  150 - 400 K/uL Final   REPEATED TO VERIFY   nRBC 04/20/2023 0.0  0.0 - 0.2 % Final   Neutrophils Relative % 04/20/2023 43  % Final   Neutro Abs 04/20/2023 3.0  1.7 - 7.7 K/uL Final   Lymphocytes Relative 04/20/2023 47  % Final   Lymphs Abs 04/20/2023 3.3  0.7 - 4.0 K/uL Final   Monocytes Relative 04/20/2023 10  % Final   Monocytes Absolute 04/20/2023 0.7  0.1 - 1.0 K/uL Final   Eosinophils Relative 04/20/2023 0  % Final   Eosinophils Absolute 04/20/2023 0.0  0.0 - 0.5 K/uL Final   Basophils Relative 04/20/2023 0  % Final   Basophils Absolute 04/20/2023 0.0  0.0 - 0.1 K/uL Final   Immature Granulocytes 04/20/2023 0  % Final   Abs Immature Granulocytes 04/20/2023 0.02  0.00 - 0.07 K/uL Final   Performed at [facility], [address], [place], [place] [address]   Sodium 04/20/2023 136  135 - 145 mmol/L Final   Potassium 04/20/2023 4.4  3.5 - 5.1 mmol/L Final   Chloride 04/20/2023 107  98 - 111 mmol/L Final   CO2 04/20/2023 19 (L)  22 - 32 mmol/L Final   Glucose, Bld 04/20/2023 141 (H)  70 - 99 mg/dL Final   Glucose reference range applies only to samples taken after fasting for at least 8 hours.   BUN 04/20/2023 18  6 - 20 mg/dL Final   Creatinine, Ser 04/20/2023 0.83  0.44 - 1.00 mg/dL Final   Calcium 53/66/4403 8.8 (L)  8.9 - 10.3 mg/dL Final   Total Protein 47/42/5956 6.4 (L)  6.5 - 8.1 g/dL Final   Albumin 38/75/6433 3.6  3.5 - 5.0 g/dL Final   AST 29/51/8841 17  15 - 41 U/L Final   ALT 04/20/2023  13  0 - 44 U/L Final   Alkaline Phosphatase 04/20/2023 41  38 - 126 U/L Final   Total Bilirubin 04/20/2023 0.6  0.3 - 1.2 mg/dL Final   GFR, Estimated 04/20/2023 >60  >60 mL/min Final   Comment: (NOTE) Calculated using the CKD-EPI Creatinine Equation (2021)    Anion gap 04/20/2023 10  5 - 15 Final   Performed at [facility], [address], [place], [place] [address]   Alcohol, Ethyl (B) 04/20/2023 <10  <10 mg/dL Final   Comment: (NOTE) Lowest detectable limit for serum alcohol is 10 mg/dL.  For medical purposes only. Performed at [facility], [address] [place], [place] [address]    TSH 04/20/2023 3.849  0.350 - 4.500 uIU/mL Final   Comment: Performed by a 3rd Generation assay with a functional sensitivity of <=0.01 uIU/mL. Performed at [facility], [address] [place], [place] [address]    Preg Test, Ur 04/20/2023 Negative  Negative Final   POC Amphetamine UR 04/20/2023 None Detected  NONE DETECTED (Cut Off Level 1000 ng/mL) Final   POC Secobarbital (BAR) 04/20/2023 None Detected  NONE DETECTED (Cut Off Level 300 ng/mL) Final   POC Buprenorphine (BUP) 04/20/2023 None Detected  NONE DETECTED (Cut Off Level 10 ng/mL) Final   POC Oxazepam (BZO) 04/20/2023 None Detected  NONE DETECTED (Cut Off Level 300 ng/mL) Final   POC Cocaine UR 04/20/2023 None Detected  NONE DETECTED (Cut Off Level 300 ng/mL) Final   POC Methamphetamine UR 04/20/2023 None Detected  NONE DETECTED (Cut Off Level 1000 ng/mL) Final   POC Morphine 04/20/2023 None Detected  NONE DETECTED (Cut Off Level 300 ng/mL) Final   POC Methadone UR 04/20/2023 None Detected  NONE DETECTED (Cut Off Level 300 ng/mL) Final   POC Oxycodone UR 04/20/2023 None Detected  NONE DETECTED (Cut Off Level 100 ng/mL) Final   POC Marijuana UR 04/20/2023 None Detected  NONE DETECTED (Cut Off Level 50 ng/mL) Final   Color,  Urine 04/20/2023 YELLOW  YELLOW Final   APPearance 04/20/2023 CLEAR  CLEAR Final   Specific  Gravity, Urine 04/20/2023 1.029  1.005 - 1.030 Final   pH 04/20/2023 5.0  5.0 - 8.0 Final   Glucose, UA 04/20/2023 NEGATIVE  NEGATIVE mg/dL Final   Hgb urine dipstick 04/20/2023 NEGATIVE  NEGATIVE Final   Bilirubin Urine 04/20/2023 NEGATIVE  NEGATIVE Final   Ketones, ur 04/20/2023 NEGATIVE  NEGATIVE mg/dL Final   Protein, ur 21/30/8657 NEGATIVE  NEGATIVE mg/dL Final   Nitrite 84/69/6295 NEGATIVE  NEGATIVE Final   Leukocytes,Ua 04/20/2023 NEGATIVE  NEGATIVE Final   Performed at [facility], [address] [place], [place] [address]  Admission on 03/07/2023, Discharged on 03/13/2023  Component Date Value Ref Range Status   hCG, Beta Chain, Quant, S 03/13/2023 <1  <5 mIU/mL Final   Comment:          GEST. AGE      CONC.  (mIU/mL)   <=1 WEEK        5 - 50     2 WEEKS       50 - 500     3 WEEKS       100 - 10,000     4 WEEKS     1,000 - 30,000     5 WEEKS     3,500 - 115,000   6-8 WEEKS     12,000 - 270,000    12 WEEKS     15,000 - 220,000        FEMALE AND NON-PREGNANT FEMALE:     LESS THAN 5 mIU/mL Performed at [facility], [address] [place], [place] [address]   Admission on 03/05/2023, Discharged on 03/07/2023  Component Date Value Ref Range Status   Sodium 03/05/2023 138  135 - 145 mmol/L Final   Potassium 03/05/2023 3.8  3.5 - 5.1 mmol/L Final   Chloride 03/05/2023 111  98 - 111 mmol/L Final   CO2 03/05/2023 19 (L)  22 - 32 mmol/L Final   Glucose, Bld 03/05/2023 190 (H)  70 - 99 mg/dL Final   Glucose reference range applies only to samples taken after fasting for at least 8 hours.   BUN 03/05/2023 18  6 - 20 mg/dL Final   Creatinine, Ser 03/05/2023 0.88  0.44 - 1.00 mg/dL Final   Calcium 02/72/5366 8.7 (L)  8.9 - 10.3 mg/dL Final   Total Protein 44/08/4740 7.0  6.5 - 8.1 g/dL Final   Albumin 59/56/3875 3.8  3.5 - 5.0 g/dL Final   AST 64/33/2951 17  15 - 41 U/L Final   ALT 03/05/2023 16  0 - 44 U/L Final   Alkaline Phosphatase 03/05/2023 43  38 - 126  U/L Final   Total Bilirubin 03/05/2023 0.4  0.3 - 1.2 mg/dL Final   GFR, Estimated 03/05/2023 >60  >60 mL/min Final   Comment: (NOTE) Calculated using the CKD-EPI Creatinine Equation (2021)    Anion gap 03/05/2023 8  5 - 15 Final   Performed at [facility], [address] [place], [place] [address]   Alcohol, Ethyl (B) 03/05/2023 <10  <10 mg/dL Final   Comment: (NOTE) Lowest detectable limit for serum alcohol is 10 mg/dL.  For medical purposes only. Performed at [facility], [address] [place], [place] [address]    Salicylate Lvl 03/05/2023 <7.0 (L)  7.0 - 30.0 mg/dL Final   Performed at [facility], [address], [place], [place]  [address]   Acetaminophen (Tylenol), Serum 03/05/2023 <10 (L)  10 - 30 ug/mL Final   Comment: (NOTE) Therapeutic concentrations vary significantly. A range of 10-30 ug/mL  may be an effective concentration for many patients. However, some  are best treated at concentrations outside of this range. Acetaminophen concentrations >150 ug/mL at 4 hours after ingestion  and >50 ug/mL at 12 hours after ingestion are often associated with  toxic reactions.  Performed at [facility], [address] [place], [place] [address]    WBC 03/05/2023 6.4  4.0 - 10.5 K/uL Final   RBC 03/05/2023 3.79 (L)  3.87 - 5.11 MIL/uL Final   Hemoglobin 03/05/2023 12.1  12.0 - 15.0 g/dL Final   HCT 19/14/7829 36.4  36.0 - 46.0 % Final   MCV 03/05/2023 96.0  80.0 - 100.0 fL Final   MCH 03/05/2023 31.9  26.0 - 34.0 pg Final   MCHC 03/05/2023 33.2  30.0 - 36.0 g/dL Final   RDW 56/21/3086 13.7  11.5 - 15.5 % Final   Platelets 03/05/2023 214  150 - 400 K/uL Final   nRBC 03/05/2023 0.0  0.0 - 0.2 % Final   Performed at [facility], [address] [place], [place] [address]   Tricyclic, Ur Screen 03/05/2023 NONE DETECTED  NONE DETECTED Final   Amphetamines, Ur Screen 03/05/2023 NONE DETECTED  NONE DETECTED Final   MDMA  (Ecstasy)Ur Screen 03/05/2023 NONE DETECTED  NONE DETECTED Final   Cocaine Metabolite,Ur [place] 03/05/2023 NONE DETECTED  NONE DETECTED Final   Opiate, Ur Screen 03/05/2023 NONE DETECTED  NONE DETECTED Final   Phencyclidine (PCP) Ur S 03/05/2023 NONE DETECTED  NONE DETECTED Final   Cannabinoid 50 Ng, Ur [place] 03/05/2023 NONE DETECTED  NONE DETECTED Final   Barbiturates, Ur Screen 03/05/2023 NONE DETECTED  NONE DETECTED Final   Benzodiazepine, Ur Scrn 03/05/2023 NONE DETECTED  NONE DETECTED Final   Methadone Scn, Ur 03/05/2023 NONE DETECTED  NONE DETECTED Final   Comment: (NOTE) Tricyclics + metabolites, urine    Cutoff 1000 ng/mL Amphetamines + metabolites, urine  Cutoff 1000 ng/mL MDMA (Ecstasy), urine              Cutoff 500 ng/mL Cocaine Metabolite, urine          Cutoff 300 ng/mL Opiate + metabolites, urine        Cutoff 300 ng/mL Phencyclidine (PCP), urine         Cutoff 25 ng/mL Cannabinoid, urine                 Cutoff 50 ng/mL Barbiturates + metabolites, urine  Cutoff 200 ng/mL Benzodiazepine, urine              Cutoff 200 ng/mL Methadone, urine                   Cutoff 300 ng/mL  The urine drug screen provides only a preliminary, unconfirmed analytical test result and should not be used for non-medical purposes. Clinical consideration and professional judgment should be applied to any positive drug screen result due to possible interfering substances. A more specific alternate chemical method must be used in order to obtain a confirmed analytical result. Gas chromatography / mass spectrometry (GC/MS) is the preferred confirm                          atory method. Performed at [facility], [address], [place], [place] [address]  Color, Urine 03/07/2023 YELLOW  YELLOW Final   APPearance 03/07/2023 CLOUDY (A)  CLEAR Final   Specific Gravity, Urine 03/07/2023 1.016  1.005 - 1.030 Final   pH 03/07/2023 6.0  5.0 - 8.0 Final   Glucose, UA 03/07/2023 NEGATIVE  NEGATIVE  mg/dL Final   Hgb urine dipstick 03/07/2023 NEGATIVE  NEGATIVE Final   Bilirubin Urine 03/07/2023 NEGATIVE  NEGATIVE Final   Ketones, ur 03/07/2023 NEGATIVE  NEGATIVE mg/dL Final   Protein, ur 16/03/9603 NEGATIVE  NEGATIVE mg/dL Final   Nitrite 54/02/8118 NEGATIVE  NEGATIVE Final   Leukocytes,Ua 03/07/2023 NEGATIVE  NEGATIVE Final   RBC / HPF 03/07/2023 0-5  0 - 5 RBC/hpf Final   WBC, UA 03/07/2023 6-10  0 - 5 WBC/hpf Final   Bacteria, UA 03/07/2023 MANY (A)  NONE SEEN Final   Squamous Epithelial / HPF 03/07/2023 0-5  0 - 5 /HPF Final   Mucus 03/07/2023 PRESENT   Final   Performed at [facility], [address] [place], [place] [address]   Specimen Description 03/07/2023    Final                   Value:URINE, CLEAN CATCH Performed at [facility], [address] [place], [place] [address]    Special Requests 03/07/2023    Final                   Value:NONE Performed at [facility], [address] [place], [place] [address]    Culture 03/07/2023 >=100,000 COLONIES/mL ESCHERICHIA COLI (A)   Final   Report Status 03/07/2023 03/09/2023 FINAL   Final   Organism ID, Bacteria 03/07/2023 ESCHERICHIA COLI (A)   Final  Admission on 01/30/2023, Discharged on 02/01/2023  Component Date Value Ref Range Status   Sodium 01/30/2023 140  135 - 145 mmol/L Final   Potassium 01/30/2023 3.4 (L)  3.5 - 5.1 mmol/L Final   Chloride 01/30/2023 112 (H)  98 - 111 mmol/L Final   CO2 01/30/2023 20 (L)  22 - 32 mmol/L Final   Glucose, Bld 01/30/2023 132 (H)  70 - 99 mg/dL Final   Glucose reference range applies only to samples taken after fasting for at least 8 hours.   BUN 01/30/2023 19  6 - 20 mg/dL Final   Creatinine, Ser 01/30/2023 1.10 (H)  0.44 - 1.00 mg/dL Final   Calcium 78/46/9629 8.8 (L)  8.9 - 10.3 mg/dL Final   Total Protein 52/84/1324 7.3  6.5 - 8.1 g/dL Final   Albumin 40/03/2724 3.9  3.5 - 5.0 g/dL Final   AST 36/64/4034 16  15 - 41 U/L Final   ALT  01/30/2023 18  0 - 44 U/L Final   Alkaline Phosphatase 01/30/2023 49  38 - 126 U/L Final   Total Bilirubin 01/30/2023 0.3  0.3 - 1.2 mg/dL Final   GFR, Estimated 01/30/2023 >60  >60 mL/min Final   Comment: (NOTE) Calculated using the CKD-EPI Creatinine Equation (2021)    Anion gap 01/30/2023 8  5 - 15 Final   Performed at [facility], [address] [place], [place] [address]   Alcohol, Ethyl (B) 01/30/2023 <10  <10 mg/dL Final   Comment: (NOTE) Lowest detectable limit for serum alcohol is 10 mg/dL.  For medical purposes only. Performed at [facility], [address] [place], [place] [address]    Salicylate Lvl 01/30/2023 <7.0 (L)  7.0 - 30.0 mg/dL Final   Performed at [facility],  [address], [place], [place] [address]   Acetaminophen (Tylenol), Serum 01/30/2023 <10 (L)  10 - 30 ug/mL Final   Comment: (NOTE) Therapeutic concentrations vary significantly. A range of 10-30 ug/mL  may be an effective concentration for many patients. However, some  are best treated at concentrations outside of this range. Acetaminophen concentrations >150 ug/mL at 4 hours after ingestion  and >50 ug/mL at 12 hours after ingestion are often associated with  toxic reactions.  Performed at [facility], [address] [place], [place] [address]    WBC 01/30/2023 7.2  4.0 - 10.5 K/uL Final   RBC 01/30/2023 3.69 (L)  3.87 - 5.11 MIL/uL Final   Hemoglobin 01/30/2023 12.1  12.0 - 15.0 g/dL Final   HCT 09/81/1914 34.2 (L)  36.0 - 46.0 % Final   MCV 01/30/2023 92.7  80.0 - 100.0 fL Final   MCH 01/30/2023 32.8  26.0 - 34.0 pg Final   MCHC 01/30/2023 35.4  30.0 - 36.0 g/dL Final   RDW 78/29/5621 12.7  11.5 - 15.5 % Final   Platelets 01/30/2023 228  150 - 400 K/uL Final   nRBC 01/30/2023 0.0  0.0 - 0.2 % Final   Performed at [facility], [address] [place], [place] [address]   Tricyclic, Ur Screen 01/30/2023 NONE DETECTED  NONE DETECTED  Final   Amphetamines, Ur Screen 01/30/2023 NONE DETECTED  NONE DETECTED Final   MDMA (Ecstasy)Ur Screen 01/30/2023 NONE DETECTED  NONE DETECTED Final   Cocaine Metabolite,Ur [place] 01/30/2023 NONE DETECTED  NONE DETECTED Final   Opiate, Ur Screen 01/30/2023 NONE DETECTED  NONE DETECTED Final   Phencyclidine (PCP) Ur S 01/30/2023 NONE DETECTED  NONE DETECTED Final   Cannabinoid 50 Ng, Ur [place] 01/30/2023 NONE DETECTED  NONE DETECTED Final   Barbiturates, Ur Screen 01/30/2023 NONE DETECTED  NONE DETECTED Final   Benzodiazepine, Ur Scrn 01/30/2023 NONE DETECTED  NONE DETECTED Final   Methadone Scn, Ur 01/30/2023 NONE DETECTED  NONE DETECTED Final   Comment: (NOTE) Tricyclics + metabolites, urine    Cutoff 1000 ng/mL Amphetamines + metabolites, urine  Cutoff 1000 ng/mL MDMA (Ecstasy), urine              Cutoff 500 ng/mL Cocaine Metabolite, urine          Cutoff 300 ng/mL Opiate + metabolites, urine        Cutoff 300 ng/mL Phencyclidine (PCP), urine         Cutoff 25 ng/mL Cannabinoid, urine                 Cutoff 50 ng/mL Barbiturates + metabolites, urine  Cutoff 200 ng/mL Benzodiazepine, urine              Cutoff 200 ng/mL Methadone, urine                   Cutoff 300 ng/mL  The urine drug screen provides only a preliminary, unconfirmed analytical test result and should not be used for non-medical purposes. Clinical consideration and professional judgment should be applied to any positive drug screen result due to possible interfering substances. A more specific alternate chemical method must be used in order to obtain a confirmed analytical result. Gas chromatography / mass spectrometry (GC/MS) is the preferred confirm                          atory method. Performed at [facility], [address] [place], [place] [address]    Preg Test, Ur 01/30/2023 NEGATIVE  NEGATIVE Final   Comment:        THE SENSITIVITY OF THIS METHODOLOGY IS >24 mIU/mL    Acetaminophen (Tylenol),  Serum 01/31/2023 <10 (L)  10 - 30 ug/mL Final   Comment: (NOTE) Therapeutic concentrations vary significantly. A range of 10-30 ug/mL  may be an effective concentration for many patients. However, some  are best treated at concentrations outside of this range. Acetaminophen concentrations >150 ug/mL at 4 hours after ingestion  and >50 ug/mL at 12 hours after ingestion are often associated with  toxic reactions.  Performed at [facility], [address] [place], [place] [address]    Salicylate Lvl 01/31/2023 <7.0 (L)  7.0 - 30.0 mg/dL Final   Performed at [facility], [address], [place], [place] [address]  Hospital Outpatient Visit on 11/18/2022  Component Date Value Ref Range Status   QuantiFERON Incubation 11/18/2022 Incubation performed.   Final   QuantiFERON-TB Gold Plus 11/18/2022 Negative  Negative Final   Comment: (NOTE) No response to M tuberculosis antigens detected. Infection with M tuberculosis is unlikely, but high risk individuals should be considered for additional testing (ATS/IDSA/CDC Clinical Practice Guidelines, 2017). The reference range is an Antigen minus Nil result of <0.35 IU/mL. Chemiluminescence immunoassay methodology Performed At: [facility] [address] [place], [place] [id number] Jolene Schimke MD [id number]    WBC 11/18/2022 5.4  4.0 - 10.5 K/uL Final   RBC 11/18/2022 4.41  3.87 - 5.11 MIL/uL Final   Hemoglobin 11/18/2022 14.0  12.0 - 15.0 g/dL Final   HCT 56/38/7564 42.2  36.0 - 46.0 % Final   MCV 11/18/2022 95.7  80.0 - 100.0 fL Final   MCH 11/18/2022 31.7  26.0 - 34.0 pg Final   MCHC 11/18/2022 33.2  30.0 - 36.0 g/dL Final   RDW 33/29/5188 13.1  11.5 - 15.5 % Final   Platelets 11/18/2022 194  150 - 400 K/uL Final   nRBC 11/18/2022 0.0  0.0 - 0.2 % Final   Neutrophils Relative % 11/18/2022 44  % Final   Neutro Abs 11/18/2022 2.3  1.7 - 7.7 K/uL Final   Lymphocytes Relative 11/18/2022 47  % Final    Lymphs Abs 11/18/2022 2.5  0.7 - 4.0 K/uL Final   Monocytes Relative 11/18/2022 8  % Final   Monocytes Absolute 11/18/2022 0.4  0.1 - 1.0 K/uL Final   Eosinophils Relative 11/18/2022 0  % Final   Eosinophils Absolute 11/18/2022 0.0  0.0 - 0.5 K/uL Final   Basophils Relative 11/18/2022 1  % Final   Basophils Absolute 11/18/2022 0.0  0.0 - 0.1 K/uL Final   Immature Granulocytes 11/18/2022 0  % Final   Abs Immature Granulocytes 11/18/2022 0.01  0.00 - 0.07 K/uL Final   Performed at [facility], [address] [place], [place] [address]   Sodium 11/18/2022 137  135 - 145 mmol/L Final   Potassium 11/18/2022 3.9  3.5 - 5.1 mmol/L Final   Chloride 11/18/2022 109  98 - 111 mmol/L Final   CO2 11/18/2022 20 (L)  22 - 32 mmol/L Final   Glucose, Bld 11/18/2022 111 (H)  70 - 99 mg/dL Final   Glucose reference range applies only to samples taken after fasting for at least 8 hours.   BUN 11/18/2022 18  6 - 20 mg/dL Final   Creatinine, Ser 11/18/2022 1.05 (H)  0.44 - 1.00 mg/dL Final   Calcium  11/18/2022 8.9  8.9 - 10.3 mg/dL Final   Total Protein 64/40/3474 7.6  6.5 - 8.1 g/dL Final   Albumin 25/95/6387 4.0  3.5 - 5.0 g/dL Final   AST 56/43/3295 17  15 - 41 U/L Final   ALT 11/18/2022 17  0 - 44 U/L Final   Alkaline Phosphatase 11/18/2022 44  38 - 126 U/L Final   Total Bilirubin 11/18/2022 0.6  0.3 - 1.2 mg/dL Final   GFR, Estimated 11/18/2022 >60  >60 mL/min Final   Comment: (NOTE) Calculated using the CKD-EPI Creatinine Equation (2021)    Anion gap 11/18/2022 8  5 - 15 Final   Performed at [facility], [address] [place], [place] [address]   RPR Ser Ql 11/18/2022 NON REACTIVE  NON REACTIVE Final   Performed at [facility], [address], [place], [place] [address]   HIV 1 RNA Quant 11/18/2022 <20  copies/mL Corrected   Comment: (NOTE) HIV-1 RNA not detected The reportable range for this assay is 20 to 10,000,000 copies HIV-1 RNA/mL. Performed At: [facility] [address] [place], [place] [id number] Jolene Schimke MD TF:[id number]    LOG10 HIV-1 RNA 11/18/2022 UNABLE TO CALCULATE  log10copy/mL Final   Comment: (NOTE) Unable to calculate result since non-numeric result obtained for component test.    Absolute CD 4 Helper 11/18/2022 1,098  359 - 1,519 /uL Final   % CD 4 Pos. Lymph. 11/18/2022 43.9  30.8 - 58.5 % Final   CD3+CD8+ Cells # Bld 11/18/2022 773  109 - 897 /uL Final   CD3+CD8+ Cells NFr Bld 11/18/2022 30.9  12.0 - 35.5 % Final   CD3+CD4+ Cells/CD3+CD8+ Cells Bld 11/18/2022 1.42  0.92 - 3.72 Final   WBC 11/18/2022 5.3  3.4 - 10.8 x10E3/uL Final   RBC 11/18/2022 4.02  3.77 - 5.28 x10E6/uL Final   Hemoglobin 11/18/2022 13.0  11.1 - 15.9 g/dL Final   Hematocrit 70/62/3762 38.9  34.0 - 46.6 % Final   MCV 11/18/2022 97  79 - 97 fL Final   MCH 11/18/2022 32.3  26.6 - 33.0 pg Final   MCHC 11/18/2022 33.4  31.5 - 35.7 g/dL Final   RDW 83/15/1761 13.3  11.7 - 15.4 % Final   Platelets 11/18/2022 183  150 - 450 x10E3/uL Final   Neutrophils 11/18/2022 43  Not Estab. % Final   Lymphs 11/18/2022 47  Not Estab. % Final   Monocytes 11/18/2022 9  Not Estab. % Final   Eos 11/18/2022 0  Not Estab. % Final   Basos 11/18/2022 1  Not Estab. % Final   Neutrophils Absolute 11/18/2022 2.3  1.4 - 7.0 x10E3/uL Final   Lymphocytes Absolute 11/18/2022 2.5  0.7 - 3.1 x10E3/uL Final   Monocytes Absolute 11/18/2022 0.5  0.1 - 0.9 x10E3/uL Final   EOS (ABSOLUTE) 11/18/2022 0.0  0.0 - 0.4 x10E3/uL Final   Basophils Absolute 11/18/2022 0.0  0.0 - 0.2 x10E3/uL Final   Immature Granulocytes 11/18/2022 0  Not Estab. % Final   Immature Grans (Abs) 11/18/2022 0.0  0.0 - 0.1 x10E3/uL Final   Comment: (NOTE) Performed At: [facility] [address] [place], [place] [id number] Jolene Schimke MD IR:[id number]    QuantiFERON Criteria 11/18/2022 Comment   Final   Comment: (NOTE) QuantiFERON-TB Gold Plus is a qualitative indirect test for M  tuberculosis infection (including disease) and is intended for use in conjunction with risk assessment, radiography, and other medical and diagnostic evaluations. The QuantiFERON-TB Gold Plus  result is determined by subtracting the Nil value from either TB antigen (Ag) value. The Mitogen tube serves as a control for the test.    QuantiFERON TB1 Ag Value 11/18/2022 0.07  IU/mL Final   QuantiFERON TB2 Ag Value 11/18/2022 0.06  IU/mL Final   QuantiFERON Nil Value 11/18/2022 0.06  IU/mL Final   QuantiFERON Mitogen Value 11/18/2022 >10.00  IU/mL Final   Comment: (NOTE) Performed At: [facility] [address] [place], [place] [id number] Jolene Schimke MD WU:[id number]     Allergies: Fish allergy, Peanut-containing drug products, and Peanut oil  Medications:  Facility Ordered Medications  Medication   diphenhydrAMINE (BENADRYL) capsule 50 mg   Or   diphenhydrAMINE (BENADRYL) injection 50 mg   LORazepam (ATIVAN) tablet 2 mg   Or   LORazepam (ATIVAN) injection 2 mg   nicotine (NICODERM CQ - dosed in mg/24 hours) patch 14 mg   PTA Medications  Medication Sig   albuterol (VENTOLIN HFA) 108 (90 Base) MCG/ACT inhaler Inhale 2 puffs into the lungs every 4 (four) hours as needed for wheezing or shortness of breath.   docusate sodium (COLACE) 100 MG capsule Take 300 mg by mouth daily.   montelukast (SINGULAIR) 10 MG tablet Take 10 mg by mouth daily.   INVEGA SUSTENNA 156 MG/ML SUSY injection Inject 234 mg into the muscle every 30 (thirty) days.   ibuprofen (ADVIL) 800 MG tablet Take 1 tablet (800 mg total) by mouth every 8 (eight) hours as needed for moderate pain.   darunavir-cobicistat (PREZCOBIX) 800-150 MG tablet Take 1 tablet by mouth daily with breakfast. Swallow whole. Do NOT crush, break or chew tablets. Take with food.   dolutegravir (TIVICAY) 50 MG tablet Take 1 tablet (50 mg total) by mouth daily.   prazosin (MINIPRESS) 5 MG capsule Take 1 capsule (5 mg total) by mouth at  bedtime.   hydrOXYzine (ATARAX) 25 MG tablet Take 1 tablet (25 mg total) by mouth 3 (three) times daily as needed.   benztropine (COGENTIN) 0.5 MG tablet Take 1 tablet (0.5 mg total) by mouth 2 (two) times daily. (Patient taking differently: Take 0.5 mg by mouth at bedtime.)   divalproex (DEPAKOTE) 500 MG DR tablet Take 1 tablet (500 mg total) by mouth every 12 (twelve) hours. (Patient taking differently: Take 500 mg by mouth 3 (three) times daily.)   topiramate (TOPAMAX) 50 MG tablet Take 1 tablet (50 mg total) by mouth 2 (two) times daily. (Patient taking differently: Take 50 mg by mouth at bedtime.)   FLUoxetine (PROZAC) 40 MG capsule Take 40 mg by mouth at bedtime.   famotidine (PEPCID) 20 MG tablet Take 20 mg by mouth daily as needed for heartburn.   traZODone (DESYREL) 50 MG tablet Take 50 mg by mouth at bedtime.   methocarbamol (ROBAXIN) 500 MG tablet Take 500 mg by mouth daily as needed (back pain).   linaclotide (LINZESS) 290 MCG CAPS capsule Take 290 mcg by mouth daily before breakfast.   triamcinolone cream (KENALOG) 0.1 % Apply 1 Application topically 2 (two) times daily. Right foot   EPINEPHrine 0.3 mg/0.3 mL IJ SOAJ injection Inject 0.3 mg into the muscle as needed for anaphylaxis.   glycerin adult 2 g suppository Place 1 suppository rectally as needed for constipation.   magnesium citrate SOLN Take 1 Bottle by mouth once.   nicotine polacrilex (NICORETTE) 2 MG gum Take 2 mg by mouth as needed for smoking cessation.   Fiber Adult Gummies 2 g CHEW Chew 1 Units by  mouth 2 (two) times daily.   SODIUM FLUORIDE, DENTAL RINSE, 0.2 % SOLN Use as directed 10 mLs in the mouth or throat at bedtime.      Medical Decision Making  Inpatient admission when a bed become available at [facility]   Meds ordered this encounter  Medications   DISCONTD: acetaminophen (TYLENOL) tablet 650 mg   DISCONTD: alum & mag hydroxide-simeth (MAALOX/MYLANTA) 200-200-20 MG/5ML suspension 30 mL   DISCONTD: magnesium  hydroxide (MILK OF MAGNESIA) suspension 30 mL   DISCONTD: OLANZapine zydis (ZYPREXA) disintegrating tablet 10 mg   DISCONTD: LORazepam (ATIVAN) tablet 1 mg   DISCONTD: ziprasidone (GEODON) injection 20 mg    . Recommendations  Based on my evaluation the patient does not appear to have an emergency medical condition.  Sindy Guadeloupe, NP 04/21/23  5:12 AM

## 2023-04-20 NOTE — Progress Notes (Signed)
[facility] LCSW Progress Note   04/20/2023    11:31 PM  Crystal Black   [id number]   Type of Contact and Topic:  Psychiatric Bed Placement   Pt accepted to [facility]    Patient meets inpatient criteria per Sindy Guadeloupe, NP   The attending provider will be Dr. Marlou Porch   Call report to [phone]  Phillips Odor, RN @ [facility] notified.     Pt scheduled  to arrive at [facility] TONIGHT.    [name], MSW, LCSW-A  11:34 PM 04/20/2023

## 2023-04-21 ENCOUNTER — Encounter: Payer: Self-pay | Admitting: Psychiatry

## 2023-04-21 ENCOUNTER — Other Ambulatory Visit: Payer: Self-pay

## 2023-04-21 ENCOUNTER — Inpatient Hospital Stay
Admission: AD | Admit: 2023-04-21 | Discharge: 2023-05-03 | DRG: 885 | Disposition: A | Discharge status: Home / Self Care | Payer: MEDICAID | Source: Intra-hospital | Attending: Psychiatry | Admitting: Psychiatry

## 2023-04-21 DIAGNOSIS — R45851 Suicidal ideations: Secondary | ICD-10-CM

## 2023-04-21 DIAGNOSIS — F840 Autistic disorder: Secondary | ICD-10-CM | POA: Diagnosis present

## 2023-04-21 DIAGNOSIS — Z79899 Other long term (current) drug therapy: Secondary | ICD-10-CM | POA: Diagnosis not present

## 2023-04-21 DIAGNOSIS — F4310 Post-traumatic stress disorder, unspecified: Secondary | ICD-10-CM | POA: Diagnosis present

## 2023-04-21 DIAGNOSIS — Z813 Family history of other psychoactive substance abuse and dependence: Secondary | ICD-10-CM

## 2023-04-21 DIAGNOSIS — F17290 Nicotine dependence, other tobacco product, uncomplicated: Secondary | ICD-10-CM | POA: Diagnosis present

## 2023-04-21 DIAGNOSIS — K5900 Constipation, unspecified: Secondary | ICD-10-CM | POA: Diagnosis not present

## 2023-04-21 DIAGNOSIS — F909 Attention-deficit hyperactivity disorder, unspecified type: Secondary | ICD-10-CM | POA: Diagnosis present

## 2023-04-21 DIAGNOSIS — G4700 Insomnia, unspecified: Secondary | ICD-10-CM | POA: Diagnosis present

## 2023-04-21 DIAGNOSIS — F411 Generalized anxiety disorder: Secondary | ICD-10-CM | POA: Diagnosis present

## 2023-04-21 DIAGNOSIS — J45909 Unspecified asthma, uncomplicated: Secondary | ICD-10-CM | POA: Diagnosis present

## 2023-04-21 DIAGNOSIS — R45850 Homicidal ideations: Secondary | ICD-10-CM | POA: Diagnosis present

## 2023-04-21 DIAGNOSIS — Z818 Family history of other mental and behavioral disorders: Secondary | ICD-10-CM | POA: Diagnosis not present

## 2023-04-21 DIAGNOSIS — Z21 Asymptomatic human immunodeficiency virus [HIV] infection status: Secondary | ICD-10-CM | POA: Diagnosis present

## 2023-04-21 DIAGNOSIS — F603 Borderline personality disorder: Secondary | ICD-10-CM | POA: Diagnosis present

## 2023-04-21 DIAGNOSIS — F332 Major depressive disorder, recurrent severe without psychotic features: Principal | ICD-10-CM | POA: Diagnosis present

## 2023-04-21 DIAGNOSIS — K219 Gastro-esophageal reflux disease without esophagitis: Secondary | ICD-10-CM | POA: Diagnosis present

## 2023-04-21 DIAGNOSIS — F17210 Nicotine dependence, cigarettes, uncomplicated: Secondary | ICD-10-CM | POA: Diagnosis present

## 2023-04-21 DIAGNOSIS — F329 Major depressive disorder, single episode, unspecified: Secondary | ICD-10-CM | POA: Diagnosis present

## 2023-04-21 MED ORDER — DARUNAVIR-COBICISTAT 800-150 MG PO TABS
1.0000 | ORAL_TABLET | Freq: Every day | ORAL | Status: DC
  Administered 2023-04-21 – 2023-05-03 (×13): 1 via ORAL
  Filled 2023-04-21 (×15): qty 1

## 2023-04-21 MED ORDER — FLUOXETINE HCL 20 MG PO CAPS
20.0000 mg | ORAL_CAPSULE | Freq: Every day | ORAL | Status: DC
  Administered 2023-04-21 – 2023-05-01 (×11): 20 mg via ORAL
  Filled 2023-04-21 (×11): qty 1

## 2023-04-21 MED ORDER — TOPIRAMATE 25 MG PO TABS
50.0000 mg | ORAL_TABLET | Freq: Two times a day (BID) | ORAL | Status: DC
  Administered 2023-04-21 – 2023-05-03 (×23): 50 mg via ORAL
  Filled 2023-04-21 (×23): qty 2

## 2023-04-21 MED ORDER — DIPHENHYDRAMINE HCL 50 MG/ML IJ SOLN
50.0000 mg | Freq: Three times a day (TID) | INTRAMUSCULAR | Status: DC | PRN
  Administered 2023-04-23 – 2023-05-01 (×3): 50 mg via INTRAMUSCULAR
  Filled 2023-04-21 (×3): qty 1

## 2023-04-21 MED ORDER — LORAZEPAM 2 MG PO TABS
2.0000 mg | ORAL_TABLET | Freq: Three times a day (TID) | ORAL | Status: DC | PRN
  Administered 2023-04-24 – 2023-04-27 (×2): 2 mg via ORAL
  Filled 2023-04-21 (×2): qty 1

## 2023-04-21 MED ORDER — DIPHENHYDRAMINE HCL 50 MG/ML IJ SOLN
50.0000 mg | Freq: Three times a day (TID) | INTRAMUSCULAR | Status: DC | PRN
Start: 2023-04-21 — End: 2023-04-21

## 2023-04-21 MED ORDER — LORAZEPAM 2 MG PO TABS
2.0000 mg | ORAL_TABLET | Freq: Three times a day (TID) | ORAL | Status: DC | PRN
Start: 2023-04-21 — End: 2023-04-21

## 2023-04-21 MED ORDER — DIPHENHYDRAMINE HCL 25 MG PO CAPS: 50.0000 mg | ORAL_CAPSULE | Freq: Three times a day (TID) | ORAL | Status: DC | PRN

## 2023-04-21 MED ORDER — DIVALPROEX SODIUM 500 MG PO DR TAB
500.0000 mg | DELAYED_RELEASE_TABLET | Freq: Three times a day (TID) | ORAL | Status: DC
  Administered 2023-04-21 – 2023-05-03 (×37): 500 mg via ORAL
  Filled 2023-04-21 (×37): qty 1

## 2023-04-21 MED ORDER — DIPHENHYDRAMINE HCL 25 MG PO CAPS
50.0000 mg | ORAL_CAPSULE | Freq: Three times a day (TID) | ORAL | Status: DC | PRN
  Administered 2023-04-27: 50 mg via ORAL
  Filled 2023-04-21: qty 2

## 2023-04-21 MED ORDER — DOLUTEGRAVIR SODIUM 50 MG PO TABS
50.0000 mg | ORAL_TABLET | Freq: Every day | ORAL | Status: DC
  Administered 2023-04-21 – 2023-05-03 (×13): 50 mg via ORAL
  Filled 2023-04-21 (×14): qty 1

## 2023-04-21 MED ORDER — HYDROXYZINE HCL 25 MG PO TABS
25.0000 mg | ORAL_TABLET | Freq: Three times a day (TID) | ORAL | Status: DC | PRN
  Administered 2023-04-22 – 2023-05-02 (×6): 25 mg via ORAL
  Filled 2023-04-21 (×9): qty 1

## 2023-04-21 MED ORDER — RISPERIDONE 1 MG PO TABS
0.5000 mg | ORAL_TABLET | ORAL | Status: DC
Start: 2023-04-21 — End: 2023-05-03
  Administered 2023-04-21 – 2023-05-03 (×25): 0.5 mg via ORAL
  Filled 2023-04-21 (×25): qty 1

## 2023-04-21 MED ORDER — TOPIRAMATE 25 MG PO TABS
50.0000 mg | ORAL_TABLET | Freq: Every day | ORAL | Status: DC
Start: 2023-04-21 — End: 2023-04-21

## 2023-04-21 MED ORDER — LORAZEPAM 2 MG/ML IJ SOLN
2.0000 mg | Freq: Three times a day (TID) | INTRAMUSCULAR | Status: DC | PRN
Start: 2023-04-21 — End: 2023-04-21

## 2023-04-21 MED ORDER — BENZTROPINE MESYLATE 1 MG PO TABS
0.5000 mg | ORAL_TABLET | Freq: Two times a day (BID) | ORAL | Status: DC
  Administered 2023-04-21 – 2023-04-23 (×5): 0.5 mg via ORAL
  Filled 2023-04-21 (×5): qty 1

## 2023-04-21 MED ORDER — TRAZODONE HCL 50 MG PO TABS
50.0000 mg | ORAL_TABLET | Freq: Every day | ORAL | Status: DC
  Administered 2023-04-22 – 2023-05-02 (×10): 50 mg via ORAL
  Filled 2023-04-21 (×10): qty 1

## 2023-04-21 MED ORDER — PRAZOSIN HCL 2 MG PO CAPS
5.0000 mg | ORAL_CAPSULE | Freq: Every day | ORAL | Status: DC
  Filled 2023-04-21: qty 1

## 2023-04-21 MED ORDER — LORAZEPAM 2 MG/ML IJ SOLN
2.0000 mg | Freq: Three times a day (TID) | INTRAMUSCULAR | Status: DC | PRN
  Administered 2023-04-23 – 2023-04-28 (×2): 2 mg via INTRAMUSCULAR
  Filled 2023-04-21 (×2): qty 1

## 2023-04-21 MED ORDER — NICOTINE 14 MG/24HR TD PT24
14.0000 mg | MEDICATED_PATCH | Freq: Every day | TRANSDERMAL | Status: DC
  Administered 2023-04-21 – 2023-04-22 (×2): 14 mg via TRANSDERMAL
  Filled 2023-04-21 (×2): qty 1
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
## 2023-04-21 NOTE — Plan of Care (Signed)
Patient stayed in bed most of the shift. This morning patient stated that she is having passive suicidal ideation and homicidal towards a peer at the group home. Patient compliant with medications.Patient denies SI,HI and AVH at this time. Appetite fair. Encouraged fluids. Support and encouragement given.

## 2023-04-21 NOTE — BHH Suicide Risk Assessment (Signed)
[facility] Admission Suicide Risk Assessment   Nursing information obtained from:  Patient Demographic factors:  Caucasian, Low socioeconomic status Current Mental Status:  Suicidal ideation indicated by patient Loss Factors:  Loss of significant relationship Historical Factors:  Impulsivity Risk Reduction Factors:  Living with another person, especially a relative, Positive social support  Total Time spent with patient: 1 hour Principal Problem: Severe episode of recurrent major depressive disorder, without psychotic features (HCC) Diagnosis:  Principal Problem:   Severe episode of recurrent major depressive disorder, without psychotic features (HCC) Active Problems:   Borderline personality disorder (HCC)   Suicidal ideations  Subjective Data: Crystal Black, 27 y/o female with a history of bipolar disorder, ADHD, PTSD, anxiety presented to [facility] voluntarily accompanied by her group home provider.  Per the patient she is suicidal and homicidal and she needs help because she is having thoughts of using a bunch of knives to hurt to people and she loses it and she will have blood all over herself patient also has intention to hurt herself with a knife.  Patient also stated she would drink chemical and according to report patient has tried drinking chemicals in the past.  I review of patient records show patient has been hospitalized on multiple occasions patient was recently discharged from inpatient at [facility] and prior to that patient was at [facility] for 4 months.  Patient currently residing on group home.  Patient does appear to be very distraught and depressed.  Patient currently takes medication however the patient did not know what medicine she takes and also they are in between providers.   Continued Clinical Symptoms:  Alcohol Use Disorder Identification Test Final Score (AUDIT): 0 The "Alcohol Use Disorders Identification Test", Guidelines for Use in Primary Care, Second Edition.  World  [employer] writer [facility]). Score between 0-7:  no or low risk or alcohol related problems. Score between 8-15:  moderate risk of alcohol related problems. Score between 16-19:  high risk of alcohol related problems. Score 20 or above:  warrants further diagnostic evaluation for alcohol dependence and treatment.   CLINICAL FACTORS:   Severe Anxiety and/or Agitation Personality Disorders:   Cluster B   Musculoskeletal: Strength & Muscle Tone: within normal limits Gait & Station: normal Patient leans: N/A  Psychiatric Specialty Exam:  Presentation  General Appearance:  Casual  Eye Contact: Good  Speech: Clear and Coherent  Speech Volume: Normal  Handedness: Right   Mood and Affect  Mood: Anxious; Depressed; Euthymic  Affect: Congruent   Thought Process  Thought Processes: Coherent  Descriptions of Associations:Intact  Orientation:Full (Time, Place and Person)  Thought Content:WDL  History of Schizophrenia/Schizoaffective disorder:Yes  Duration of Psychotic Symptoms:N/A  Hallucinations:Hallucinations: None  Ideas of Reference:None  Suicidal Thoughts:Suicidal Thoughts: Yes, Active SI Active Intent and/or Plan: With Intent; With Plan  Homicidal Thoughts:Homicidal Thoughts: Yes, Passive HI Passive Intent and/or Plan: With Intent; With Plan   Sensorium  Memory: Immediate Fair  Judgment: Intact  Insight: Poor   Executive Functions  Concentration: Fair  Attention Span: Fair  Recall: Fair  Fund of Knowledge: Fair  Language: Fair   Psychomotor Activity  Psychomotor Activity: Psychomotor Activity: Normal   Assets  Assets: Desire for Improvement; Social Support   Sleep  Sleep: Sleep: Fair Number of Hours of Sleep: 6     Blood pressure 106/77, pulse 98, temperature 97.7 F (36.5 C), temperature source Oral, resp. rate 14, height 5\' 2"  (1.575 m), weight 84.4 kg, SpO2 99%. Body mass index is 34.02 kg/m.  COGNITIVE  FEATURES THAT CONTRIBUTE TO RISK:  Closed-mindedness    SUICIDE RISK:   Mild:  Suicidal ideation of limited frequency, intensity, duration, and specificity.  There are no identifiable plans, no associated intent, mild dysphoria and related symptoms, good self-control (both objective and subjective assessment), few other risk factors, and identifiable protective factors, including available and accessible social support.  PLAN OF CARE: See orders  I certify that inpatient services furnished can reasonably be expected to improve the patient's condition.   Sarina Ill, DO 04/21/2023, 12:32 PM

## 2023-04-21 NOTE — ED Notes (Signed)
Report has been given to receiving nurse at [facility]. Safe transport has been called to transport patient.

## 2023-04-21 NOTE — Group Note (Signed)
Recreation Therapy Group Note   Group Topic:Leisure Education  Group Date: 04/21/2023 Start Time: 1015 End Time: 1120 Facilitators: Rosina Lowenstein, LRT, CTRS Location:  Craft Room  Group Description: Leisure. Patients were given the option to choose from singing karaoke, coloring mandalas, using oil pastels, journaling, or playing with play-doh. LRT and pts discussed the meaning of leisure, the importance of participating in leisure during their free time/when they're outside of the hospital, as well as how our leisure interests can also serve as coping skills.   Goal Area(s) Addressed:  Patient will identify a current leisure interest.  Patient will learn the definition of "leisure". Patient will practice making a positive decision. Patient will have the opportunity to try a new leisure activity. Patient will communicate with peers and LRT.    Affect/Mood: N/A   Participation Level: Did not attend    Clinical Observations/Individualized Feedback: Crystal Black did not attend group.  Plan: Continue to engage patient in RT group sessions 2-3x/week.   Rosina Lowenstein, LRT, CTRS 04/21/2023 12:23 PM

## 2023-04-21 NOTE — BH IP Treatment Plan (Signed)
Interdisciplinary Treatment and Diagnostic Plan Update  04/21/2023 Time of Session: 9:35 AM  Crystal Black MRN: [id number]  Principal Diagnosis: Severe episode of recurrent major depressive disorder, without psychotic features (HCC)  Secondary Diagnoses: Principal Problem:   Severe episode of recurrent major depressive disorder, without psychotic features (HCC) Active Problems:   Borderline personality disorder (HCC)   Suicidal ideations   Current Medications:  Current Facility-Administered Medications  Medication Dose Route Frequency Provider Last Rate Last Admin   benztropine (COGENTIN) tablet 0.5 mg  0.5 mg Oral BID Charm Rings, NP   0.5 mg at 04/21/23 0905   darunavir-cobicistat (PREZCOBIX) 800-150 MG per tablet 1 tablet  1 tablet Oral Q breakfast Charm Rings, NP   1 tablet at 04/21/23 4098   diphenhydrAMINE (BENADRYL) capsule 50 mg  50 mg Oral TID PRN Sindy Guadeloupe, NP       Or   diphenhydrAMINE (BENADRYL) injection 50 mg  50 mg Intramuscular TID PRN Sindy Guadeloupe, NP       divalproex (DEPAKOTE) DR tablet 500 mg  500 mg Oral TID Charm Rings, NP   500 mg at 04/21/23 0905   dolutegravir (TIVICAY) tablet 50 mg  50 mg Oral Daily Charm Rings, NP   50 mg at 04/21/23 1191   hydrOXYzine (ATARAX) tablet 25 mg  25 mg Oral TID PRN Charm Rings, NP       LORazepam (ATIVAN) tablet 2 mg  2 mg Oral TID PRN Sindy Guadeloupe, NP       Or   LORazepam (ATIVAN) injection 2 mg  2 mg Intramuscular TID PRN Sindy Guadeloupe, NP       nicotine (NICODERM CQ - dosed in mg/24 hours) patch 14 mg  14 mg Transdermal Daily Sarina Ill, DO   14 mg at 04/21/23 0900   prazosin (MINIPRESS) capsule 5 mg  5 mg Oral QHS Charm Rings, NP       topiramate (TOPAMAX) tablet 50 mg  50 mg Oral QHS Charm Rings, NP       traZODone (DESYREL) tablet 50 mg  50 mg Oral QHS Charm Rings, NP       PTA Medications: Medications Prior to Admission  Medication Sig Dispense Refill Last Dose    albuterol (VENTOLIN HFA) 108 (90 Base) MCG/ACT inhaler Inhale 2 puffs into the lungs every 4 (four) hours as needed for wheezing or shortness of breath.      benztropine (COGENTIN) 0.5 MG tablet Take 1 tablet (0.5 mg total) by mouth 2 (two) times daily. (Patient taking differently: Take 0.5 mg by mouth at bedtime.) 60 tablet 1    darunavir-cobicistat (PREZCOBIX) 800-150 MG tablet Take 1 tablet by mouth daily with breakfast. Swallow whole. Do NOT crush, break or chew tablets. Take with food. 30 tablet 1    divalproex (DEPAKOTE) 500 MG DR tablet Take 1 tablet (500 mg total) by mouth every 12 (twelve) hours. (Patient taking differently: Take 500 mg by mouth 3 (three) times daily.) 60 tablet 1    docusate sodium (COLACE) 100 MG capsule Take 300 mg by mouth daily.      dolutegravir (TIVICAY) 50 MG tablet Take 1 tablet (50 mg total) by mouth daily. 30 tablet 1    EPINEPHrine 0.3 mg/0.3 mL IJ SOAJ injection Inject 0.3 mg into the muscle as needed for anaphylaxis.      etonogestrel (NEXPLANON) 68 MG IMPL implant 1 each (68 mg total) by Subdermal route once for 1 dose. 1  each 0    famotidine (PEPCID) 20 MG tablet Take 20 mg by mouth daily as needed for heartburn.      Fiber Adult Gummies 2 g CHEW Chew 1 Units by mouth 2 (two) times daily.      FLUoxetine (PROZAC) 40 MG capsule Take 40 mg by mouth at bedtime.      glycerin adult 2 g suppository Place 1 suppository rectally as needed for constipation.      hydrOXYzine (ATARAX) 25 MG tablet Take 1 tablet (25 mg total) by mouth 3 (three) times daily as needed. 60 tablet 1    ibuprofen (ADVIL) 800 MG tablet Take 1 tablet (800 mg total) by mouth every 8 (eight) hours as needed for moderate pain. 15 tablet 0    INVEGA SUSTENNA 156 MG/ML SUSY injection Inject 234 mg into the muscle every 30 (thirty) days.      linaclotide (LINZESS) 290 MCG CAPS capsule Take 290 mcg by mouth daily before breakfast.      magnesium citrate SOLN Take 1 Bottle by mouth once.       methocarbamol (ROBAXIN) 500 MG tablet Take 500 mg by mouth daily as needed (back pain).      montelukast (SINGULAIR) 10 MG tablet Take 10 mg by mouth daily.      nicotine polacrilex (NICORETTE) 2 MG gum Take 2 mg by mouth as needed for smoking cessation.      prazosin (MINIPRESS) 5 MG capsule Take 1 capsule (5 mg total) by mouth at bedtime. 30 capsule 1    SODIUM FLUORIDE, DENTAL RINSE, 0.2 % SOLN Use as directed 10 mLs in the mouth or throat at bedtime.      topiramate (TOPAMAX) 50 MG tablet Take 1 tablet (50 mg total) by mouth 2 (two) times daily. (Patient taking differently: Take 50 mg by mouth at bedtime.) 60 tablet 1    traZODone (DESYREL) 50 MG tablet Take 50 mg by mouth at bedtime.      triamcinolone cream (KENALOG) 0.1 % Apply 1 Application topically 2 (two) times daily. Right foot       Patient Stressors:    Patient Strengths:    Treatment Modalities: Medication Management, Group therapy, Case management,  1 to 1 session with clinician, Psychoeducation, Recreational therapy.   Physician Treatment Plan for Primary Diagnosis: Severe episode of recurrent major depressive disorder, without psychotic features (HCC) Long Term Goal(s):     Short Term Goals:    Medication Management: Evaluate patient's response, side effects, and tolerance of medication regimen.  Therapeutic Interventions: 1 to 1 sessions, Unit Group sessions and Medication administration.  Evaluation of Outcomes: Progressing  Physician Treatment Plan for Secondary Diagnosis: Principal Problem:   Severe episode of recurrent major depressive disorder, without psychotic features (HCC) Active Problems:   Borderline personality disorder (HCC)   Suicidal ideations  Long Term Goal(s):     Short Term Goals:       Medication Management: Evaluate patient's response, side effects, and tolerance of medication regimen.  Therapeutic Interventions: 1 to 1 sessions, Unit Group sessions and Medication  administration.  Evaluation of Outcomes: Progressing   RN Treatment Plan for Primary Diagnosis: Severe episode of recurrent major depressive disorder, without psychotic features (HCC) Long Term Goal(s): Knowledge of disease and therapeutic regimen to maintain health will improve  Short Term Goals: Ability to remain free from injury will improve, Ability to verbalize frustration and anger appropriately will improve, Ability to demonstrate self-control, Ability to participate in decision making will improve, Ability  to verbalize feelings will improve, Ability to disclose and discuss suicidal ideas, Ability to identify and develop effective coping behaviors will improve, and Compliance with prescribed medications will improve  Medication Management: RN will administer medications as ordered by provider, will assess and evaluate patient's response and provide education to patient for prescribed medication. RN will report any adverse and/or side effects to prescribing provider.  Therapeutic Interventions: 1 on 1 counseling sessions, Psychoeducation, Medication administration, Evaluate responses to treatment, Monitor vital signs and CBGs as ordered, Perform/monitor CIWA, COWS, AIMS and Fall Risk screenings as ordered, Perform wound care treatments as ordered.  Evaluation of Outcomes: Progressing   LCSW Treatment Plan for Primary Diagnosis: Severe episode of recurrent major depressive disorder, without psychotic features (HCC) Long Term Goal(s): Safe transition to appropriate next level of care at discharge, Engage patient in therapeutic group addressing interpersonal concerns.  Short Term Goals: Engage patient in aftercare planning with referrals and resources, Increase social support, Increase ability to appropriately verbalize feelings, Increase emotional regulation, Facilitate acceptance of mental health diagnosis and concerns, Facilitate patient progression through stages of change regarding  substance use diagnoses and concerns, Identify triggers associated with mental health/substance abuse issues, and Increase skills for wellness and recovery  Therapeutic Interventions: Assess for all discharge needs, 1 to 1 time with Social worker, Explore available resources and support systems, Assess for adequacy in community support network, Educate family and significant other(s) on suicide prevention, Complete Psychosocial Assessment, Interpersonal group therapy.  Evaluation of Outcomes: Progressing   Progress in Treatment: Attending groups: Yes. and No. Participating in groups: Yes. and No. Taking medication as prescribed: Yes. Toleration medication: Yes. Family/Significant other contact made: No, will contact:  CSW will contact legal guardian  Patient understands diagnosis: Yes. Discussing patient identified problems/goals with staff: Yes. Medical problems stabilized or resolved: Yes. Denies suicidal/homicidal ideation: No. Issues/concerns per patient self-inventory: No. Other: None   New problem(s) identified: No, Describe:  None identified   New Short Term/Long Term Goal(s):  elimination of symptoms of psychosis, medication management for mood stabilization; elimination of SI thoughts; development of comprehensive mental wellness plan.   Patient Goals:  "The biggest problem is learning how to cope with my anger and emotions"   Discharge Plan or Barriers: CSW will assist with appropriate discharge planning   Reason for Continuation of Hospitalization: Aggression Homicidal ideation Medication stabilization Suicidal ideation  Estimated Length of Stay: 1 to 7 days   Last 3 [place] Suicide Severity Risk Score: Flowsheet Row ED from 04/20/2023 in [facility] Most recent reading at 04/21/2023  6:14 AM Admission (Current) from 04/21/2023 in [facility] INPATIENT BEHAVIORAL MEDICINE Most recent reading at 04/21/2023  2:02 AM Admission (Discharged) from  03/07/2023 in [facility] INPATIENT BEHAVIORAL MEDICINE Most recent reading at 03/07/2023 10:11 PM  C-SSRS RISK CATEGORY High Risk Low Risk Low Risk       Last PHQ 2/9 Scores:    02/16/2023    9:49 AM 11/17/2022   10:53 AM 05/26/2022   12:03 PM  Depression screen PHQ 2/9  Decreased Interest 0 0 0  Down, Depressed, Hopeless 0 1 1  PHQ - 2 Score 0 1 1    Scribe for Treatment Team: Elza Rafter, [place] 04/21/2023 10:51 AM

## 2023-04-21 NOTE — BHH Counselor (Addendum)
CSW contacted guardian, [name] [phone]). [name] was informed that pt is here at the hospital. She shared that pt will be returning to the group home upon discharge. [name] stated that she would like to come visit the pt tomorrow, acknowledging that she spent the day trying to find pt in [place]. She inquired if there was any particular procedure that she needed to follow. CSW informed her that his current understanding was that provider would have to write an order for her to visit outside of standard visitation time. [name] was informed that if CSW was made aware of any response for providers regarding this she would be updated. She agreed. No other concerns expressed. Contact ended without incident.   CSW messaged providers regarding order for visitor outside of visitation hours.   Vilma Meckel. Algis Greenhouse, MSW, LCSW, LCAS 04/21/2023 3:39 PM  Guardian was notified that the order has been placed by the provider.   Vilma Meckel. Algis Greenhouse, MSW, LCSW, LCAS 04/21/2023 3:54 PM

## 2023-04-21 NOTE — Progress Notes (Signed)
Report called to [facility] for this patient.

## 2023-04-21 NOTE — BHH Counselor (Signed)
CSW attempted to meet with pt for completion of the assessment. Pt was sleeping soundly and did not appear to be in any acute distress. CSW will attempt assessment at a later date.   Vilma Meckel. Algis Greenhouse, MSW, LCSW, LCAS 04/21/2023 2:58 PM

## 2023-04-21 NOTE — Plan of Care (Signed)
Pt new to the unit tonight, hasn't had time to progress  Problem: Education: Goal: Knowledge of [place] [facility] Education information/materials will improve Outcome: Not Progressing Goal: Emotional status will improve Outcome: Not Progressing Goal: Mental status will improve Outcome: Not Progressing Goal: Verbalization of understanding the information provided will improve Outcome: Not Progressing   Problem: Activity: Goal: Interest or engagement in activities will improve Outcome: Not Progressing Goal: Sleeping patterns will improve Outcome: Not Progressing   Problem: Coping: Goal: Ability to verbalize frustrations and anger appropriately will improve Outcome: Not Progressing Goal: Ability to demonstrate self-control will improve Outcome: Not Progressing   Problem: Health Behavior/Discharge Planning: Goal: Identification of resources available to assist in meeting health care needs will improve Outcome: Not Progressing Goal: Compliance with treatment plan for underlying cause of condition will improve Outcome: Not Progressing   Problem: Physical Regulation: Goal: Ability to maintain clinical measurements within normal limits will improve Outcome: Not Progressing   Problem: Safety: Goal: Periods of time without injury will increase Outcome: Not Progressing   Problem: Education: Goal: Knowledge of  General Education information/materials will improve Outcome: Not Progressing Goal: Emotional status will improve Outcome: Not Progressing Goal: Mental status will improve Outcome: Not Progressing Goal: Verbalization of understanding the information provided will improve Outcome: Not Progressing   Problem: Activity: Goal: Interest or engagement in activities will improve Outcome: Not Progressing Goal: Sleeping patterns will improve Outcome: Not Progressing   Problem: Coping: Goal: Ability to verbalize frustrations and anger appropriately will  improve Outcome: Not Progressing Goal: Ability to demonstrate self-control will improve Outcome: Not Progressing   Problem: Health Behavior/Discharge Planning: Goal: Identification of resources available to assist in meeting health care needs will improve Outcome: Not Progressing Goal: Compliance with treatment plan for underlying cause of condition will improve Outcome: Not Progressing   Problem: Physical Regulation: Goal: Ability to maintain clinical measurements within normal limits will improve Outcome: Not Progressing   Problem: Safety: Goal: Periods of time without injury will increase Outcome: Not Progressing   Problem: Education: Goal: Ability to make informed decisions regarding treatment will improve Outcome: Not Progressing   Problem: Coping: Goal: Coping ability will improve Outcome: Not Progressing   Problem: Health Behavior/Discharge Planning: Goal: Identification of resources available to assist in meeting health care needs will improve Outcome: Not Progressing   Problem: Medication: Goal: Compliance with prescribed medication regimen will improve Outcome: Not Progressing   Problem: Self-Concept: Goal: Ability to disclose and discuss suicidal ideas will improve Outcome: Not Progressing

## 2023-04-21 NOTE — Group Note (Signed)
Date:  04/21/2023 Time:  9:28 PM  Group Topic/Focus:  Wrap-Up Group:   The focus of this group is to help patients review their daily goal of treatment and discuss progress on daily workbooks.    Participation Level:  Did Not Attend  Participation Quality:   none  Affect:   none  Cognitive:   none  Insight: None  Engagement in Group:   none  Modes of Intervention:   none  Additional Comments:  none   [name] 04/21/2023, 9:28 PM

## 2023-04-21 NOTE — Group Note (Signed)
Date:  04/21/2023 Time:  5:39 PM  Group Topic/Focus:  Activity Group:  The focus of the group is to promote activity and encourage the patients to go outside to the courtyard and get some fresh air and some exercise.    Participation Level:  Active  Participation Quality:  Appropriate  Affect:  Appropriate  Cognitive:  Appropriate  Insight: Appropriate  Engagement in Group:  Engaged  Modes of Intervention:  Activity  Additional Comments:    Crystal Black Crystal Black 04/21/2023, 5:39 PM

## 2023-04-22 DIAGNOSIS — F332 Major depressive disorder, recurrent severe without psychotic features: Secondary | ICD-10-CM | POA: Diagnosis not present

## 2023-04-22 MED ORDER — NICOTINE POLACRILEX 2 MG MT GUM
2.0000 mg | CHEWING_GUM | OROMUCOSAL | Status: DC | PRN
  Administered 2023-04-23 – 2023-05-03 (×23): 2 mg via ORAL
  Filled 2023-04-22 (×27): qty 1

## 2023-04-22 NOTE — Group Note (Signed)
Date:  04/22/2023 Time:  1:46 PM  Group Topic/Focus:  Activity Group:  The focus of the group is to promote activity for the patients to encourage exercise to go out in the courtyard and get some exercise.     Participation Level:  Active  Participation Quality:  Appropriate  Affect:  Appropriate  Cognitive:  Appropriate  Insight: Appropriate  Engagement in Group:  Engaged  Modes of Intervention:  Activity  Additional Comments:    Crystal Black 04/22/2023, 1:46 PM

## 2023-04-22 NOTE — Group Note (Signed)
Date:  04/22/2023 Time:  8:47 PM  Group Topic/Focus:  Music therapy    Participation Level:  Did Not Attend  Participation Quality:   none  Affect:   none  Cognitive:   none  Insight: None  Engagement in Group:   none  Modes of Intervention:   none  Additional Comments:     Leolia Vinzant 04/22/2023, 8:47 PM
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
## 2023-04-22 NOTE — Progress Notes (Signed)
   04/21/23 2100  Psych Admission Type (Psych Patients Only)  Admission Status Voluntary  Psychosocial Assessment  Patient Complaints Restlessness  Eye Contact Brief  Facial Expression Anxious  Affect Appropriate to circumstance  Speech Slow  Interaction Assertive  Motor Activity Slow  Appearance/Hygiene Improved  Behavior Characteristics Unwilling to participate;Calm  Mood Pleasant;Other (Comment) (sleeping)  Aggressive Behavior  Effect No apparent injury  Thought Process  Coherency Circumstantial  Content Blaming others  Delusions None reported or observed  Perception WDL  Hallucination None reported or observed  Judgment Impaired  Confusion Mild  Danger to Self  Current suicidal ideation? Denies  Danger to Others  Danger to Others None reported or observed

## 2023-04-22 NOTE — Progress Notes (Signed)
Patient presents in animated mood and showered today. Patient also attended groups/recreation. Patient socializing appropriately and demonstrates adequate appetite. Patient is med compliant and did report feeling anxious today due to a group discussion. Patient encouraged to use coping skills and also received hydroxyzine po prn. Patient remains cooperative on unit.

## 2023-04-22 NOTE — Plan of Care (Signed)
  Problem: Education: Goal: Knowledge of [place] [facility] Education information/materials will improve Outcome: Progressing   

## 2023-04-22 NOTE — Group Note (Deleted)
Date:  04/22/2023 Time:  2:51 PM  Group Topic/Focus:  Self Care:   The focus of this group is to help patients understand the importance of self-care in order to improve or restore emotional, physical, spiritual, interpersonal, and financial health.     Participation Level:  {BHH PARTICIPATION ZOXWR:60454}  Participation Quality:  {BHH PARTICIPATION QUALITY:22265}  Affect:  {BHH AFFECT:22266}  Cognitive:  {BHH COGNITIVE:22267}  Insight: {BHH Insight2:20797}  Engagement in Group:  {BHH ENGAGEMENT IN UJWJX:91478}  Modes of Intervention:  {BHH MODES OF INTERVENTION:22269}  Additional Comments:  ***  Rella Egelston 04/22/2023, 2:51 PM

## 2023-04-22 NOTE — Plan of Care (Signed)
  Problem: Safety: Goal: Periods of time without injury will increase Outcome: Progressing   Problem: Activity: Goal: Interest or engagement in leisure activities will improve Outcome: Progressing   Problem: Coping: Goal: Coping ability will improve Outcome: Progressing   Problem: Role Relationship: Goal: Will demonstrate positive changes in social behaviors and relationships Outcome: Progressing

## 2023-04-23 DIAGNOSIS — F332 Major depressive disorder, recurrent severe without psychotic features: Secondary | ICD-10-CM | POA: Diagnosis not present

## 2023-04-23 MED ORDER — DOCUSATE SODIUM 100 MG PO CAPS
200.0000 mg | ORAL_CAPSULE | Freq: Every day | ORAL | Status: DC | PRN
  Administered 2023-04-26 – 2023-04-27 (×2): 200 mg via ORAL
  Filled 2023-04-23 (×3): qty 2

## 2023-04-23 MED ORDER — ALBUTEROL SULFATE HFA 108 (90 BASE) MCG/ACT IN AERS
2.0000 | INHALATION_SPRAY | RESPIRATORY_TRACT | Status: DC | PRN
  Administered 2023-04-23 – 2023-05-01 (×3): 2 via RESPIRATORY_TRACT
  Filled 2023-04-23: qty 6.7

## 2023-04-23 NOTE — Group Note (Signed)
Date:  04/23/2023 Time:  10:27 AM  Group Topic/Focus:  Goals Group:   The focus of this group is to help patients establish daily goals to achieve during treatment and discuss how the patient can incorporate goal setting into their daily lives to aide in recovery. Healthy Communication:   The focus of this group is to discuss communication, barriers to communication, as well as healthy ways to communicate with others. Identifying Needs:   The focus of this group is to help patients identify their personal needs that have been historically problematic and identify healthy behaviors to address their needs.    Participation Level:  Did Not Attend   Crystal Black 04/23/2023, 10:27 AM

## 2023-04-23 NOTE — Progress Notes (Signed)
Patient encouraged to increase fluid intake, in which she already was given a Gatorade to help bring.   04/23/23 0901  Vital Signs  Pulse Rate 94  BP (!) 106/49  BP Location Right Arm  BP Method Automatic  Patient Position (if appropriate) Sitting

## 2023-04-23 NOTE — Group Note (Signed)
LCSW Group Therapy Note  Group Date: 04/23/2023 Start Time: 1410 End Time: 1455   Type of Therapy and Topic:  Group Therapy - Coping Skills  Participation Level:  Active   Description of Group The focus of this group was to determine what unhealthy coping techniques typically are used by group members and what healthy coping techniques would be helpful in coping with various problems. Patients were guided in becoming aware of the differences between healthy and unhealthy coping techniques. Patients were asked to identify 2-3 healthy coping skills they would like to learn to use more effectively.  Therapeutic Goals Patients learned that coping is what human beings do all day long to deal with various situations in their lives Patients defined and discussed healthy vs unhealthy coping techniques Patients identified their preferred coping techniques and identified whether these were healthy or unhealthy Patients determined 2-3 healthy coping skills they would like to become more familiar with and use more often. Patients provided support and ideas to each other   Summary of Patient Progress:  The patient attended group. Patient proved open to input from peers and feedback from [facility]. Patient demonstrated  insight into the subject matter, was respectful of peers, and participated throughout the entire session. The patient participated in the game of BINGO.      Marshell Levan, LCSWA 04/23/2023  3:06 PM

## 2023-04-23 NOTE — Group Note (Signed)
Date:  04/23/2023 Time:  5:07 PM  Group Topic/Focus:  STRUCTURED GROUP ACTIVITY  The focus of the group is to promote activity for the patients to encourage exercise to go out in the courtyard and get some exercise.     Participation Level:  Active  Participation Quality:  Appropriate  Affect:  Appropriate  Cognitive:  Appropriate  Insight: Appropriate  Engagement in Group:  Developing/Improving and Engaged  Modes of Intervention:  Activity  Additional Comments:    Daren Yeagle 04/23/2023, 5:07 PM
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
## 2023-04-23 NOTE — Group Note (Signed)
Date:  04/23/2023 Time:  9:17 PM  Group Topic/Focus:  Wrap-Up Group:   The focus of this group is to help patients review their daily goal of treatment and discuss progress on daily workbooks.    Participation Level:  Did Not Attend  Participation Quality:   none  Affect:   none  Cognitive:   none  Insight: None  Engagement in Group:   none  Modes of Intervention:   none  Additional Comments:  none   [name] 04/23/2023, 9:17 PM

## 2023-04-23 NOTE — Plan of Care (Signed)
  Problem: Education: Goal: Verbalization of understanding the information provided will improve Outcome: Progressing   Problem: Education: Goal: Emotional status will improve Outcome: Progressing

## 2023-04-23 NOTE — Plan of Care (Signed)
  Problem: Education: Goal: Knowledge of [facility] Education information/materials will improve Outcome: Not Progressing Goal: Emotional status will improve Outcome: Not Progressing Goal: Mental status will improve Outcome: Not Progressing Goal: Verbalization of understanding the information provided will improve Outcome: Not Progressing   Problem: Activity: Goal: Interest or engagement in activities will improve Outcome: Not Progressing Goal: Sleeping patterns will improve Outcome: Not Progressing   Problem: Coping: Goal: Ability to verbalize frustrations and anger appropriately will improve Outcome: Not Progressing Goal: Ability to demonstrate self-control will improve Outcome: Not Progressing   Problem: Health Behavior/Discharge Planning: Goal: Identification of resources available to assist in meeting health care needs will improve Outcome: Not Progressing Goal: Compliance with treatment plan for underlying cause of condition will improve Outcome: Not Progressing   Problem: Physical Regulation: Goal: Ability to maintain clinical measurements within normal limits will improve Outcome: Not Progressing   Problem: Safety: Goal: Periods of time without injury will increase Outcome: Not Progressing   Problem: Education: Goal: Knowledge of [facility] Education information/materials will improve Outcome: Not Progressing Goal: Emotional status will improve Outcome: Not Progressing Goal: Mental status will improve Outcome: Not Progressing Goal: Verbalization of understanding the information provided will improve Outcome: Not Progressing   Problem: Activity: Goal: Interest or engagement in activities will improve Outcome: Not Progressing Goal: Sleeping patterns will improve Outcome: Not Progressing   Problem: Coping: Goal: Ability to verbalize frustrations and anger appropriately will improve Outcome: Not Progressing Goal: Ability to demonstrate  self-control will improve Outcome: Not Progressing   Problem: Health Behavior/Discharge Planning: Goal: Identification of resources available to assist in meeting health care needs will improve Outcome: Not Progressing Goal: Compliance with treatment plan for underlying cause of condition will improve Outcome: Not Progressing   Problem: Physical Regulation: Goal: Ability to maintain clinical measurements within normal limits will improve Outcome: Not Progressing   Problem: Safety: Goal: Periods of time without injury will increase Outcome: Not Progressing   Problem: Education: Goal: Ability to make informed decisions regarding treatment will improve Outcome: Not Progressing   Problem: Coping: Goal: Coping ability will improve Outcome: Not Progressing   Problem: Health Behavior/Discharge Planning: Goal: Identification of resources available to assist in meeting health care needs will improve Outcome: Not Progressing   Problem: Medication: Goal: Compliance with prescribed medication regimen will improve Outcome: Not Progressing   Problem: Self-Concept: Goal: Ability to disclose and discuss suicidal ideas will improve Outcome: Not Progressing   Problem: Activity: Goal: Interest or engagement in leisure activities will improve Outcome: Not Progressing   Problem: Coping: Goal: Coping ability will improve Outcome: Not Progressing   Problem: Role Relationship: Goal: Will demonstrate positive changes in social behaviors and relationships Outcome: Not Progressing
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
## 2023-04-23 NOTE — Progress Notes (Signed)
   04/22/23 2000  Psych Admission Type (Psych Patients Only)  Admission Status Voluntary  Psychosocial Assessment  Patient Complaints None  Eye Contact Brief  Facial Expression Anxious  Affect Appropriate to circumstance  Speech Slow  Interaction Assertive  Motor Activity Slow  Appearance/Hygiene Improved  Behavior Characteristics Cooperative  Mood Pleasant;Depressed  Aggressive Behavior  Effect No apparent injury  Thought Process  Coherency Circumstantial  Content Blaming others  Delusions None reported or observed  Perception WDL  Hallucination None reported or observed  Judgment Impaired  Confusion Mild  Danger to Self  Current suicidal ideation? Denies  Danger to Others  Danger to Others None reported or observed   Patient alert and oriented x 4, affect is flat but brightens upon approach, notes isolated to room,but denies SI/HI/AVH 15 minutes safety checks maintained will continue to monitor.
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
## 2023-04-24 DIAGNOSIS — F332 Major depressive disorder, recurrent severe without psychotic features: Secondary | ICD-10-CM | POA: Diagnosis not present

## 2023-04-24 MED ORDER — MAGNESIUM HYDROXIDE 400 MG/5ML PO SUSP
15.0000 mL | Freq: Once | ORAL | Status: AC
  Administered 2023-04-24: 15 mL via ORAL
  Filled 2023-04-24: qty 30

## 2023-04-24 MED ORDER — MAGNESIUM CITRATE PO SOLN
0.5000 | Freq: Once | ORAL | Status: AC
  Administered 2023-04-24: 0.5 via ORAL
  Filled 2023-04-24: qty 296

## 2023-04-24 NOTE — Group Note (Signed)
Recreation Therapy Group Note   Group Topic:Goal Setting  Group Date: 04/24/2023 Start Time: 1015 End Time: 1115 Facilitators: Clinton Gallant, CTRS Location:  Craft Room  Group Description: Vision Boards. Patients were given many different magazines, a glue stick, markers, and a piece of cardstock paper. LRT and pts discussed the importance of having goals in life. LRT and pts discussed the difference between short-term and long-term goals, as well as what a SMART goal is. LRT encouraged pts to create a vision board, with images they picked and then cut out with safety scissors from the magazine, for themselves, that capture their short and long-term goals. LRT encouraged pts to show and explain their vision board to the group.   Goal Area(s) Addressed:  Patient will gain knowledge of short vs. long term goals.  Patient will identify goals for themselves. Patient will practice setting SMART goals. Patient will verbalize their goals to LRT and peers.   Affect/Mood: Appropriate   Participation Level: Active and Engaged   Participation Quality: Independent   Behavior: Calm and Cooperative   Speech/Thought Process: Coherent   Insight: Good   Judgement: Good   Modes of Intervention: Art   Patient Response to Interventions:  Attentive, Engaged, Interested , and Receptive   Education Outcome:  Acknowledges education   Clinical Observations/Individualized Feedback: Stacey was active in their participation of session activities and group discussion. Pt identified "To be free, stay out of jail and here, to get better at doing make up, and eat better" as her goals. Pt interacted well with LRT and peers duration of session.   Plan: Continue to engage patient in RT group sessions 2-3x/week.   Rosina Lowenstein, LRT, CTRS 04/24/2023 11:36 AM

## 2023-04-24 NOTE — Plan of Care (Signed)
  Problem: Education: Goal: Verbalization of understanding the information provided will improve Outcome: Progressing   Problem: Activity: Goal: Interest or engagement in activities will improve Outcome: Progressing   Problem: Coping: Goal: Ability to verbalize frustrations and anger appropriately will improve Outcome: Progressing Goal: Ability to demonstrate self-control will improve Outcome: Progressing   Problem: Health Behavior/Discharge Planning: Goal: Compliance with treatment plan for underlying cause of condition will improve Outcome: Progressing   Problem: Safety: Goal: Periods of time without injury will increase Outcome: Progressing

## 2023-04-24 NOTE — Group Note (Signed)
Date:  04/24/2023 Time:  6:08 PM  Group Topic/Focus:  Outdoor Structured Activity Group    Participation Level:  Active  Participation Quality:  Appropriate  Affect:  Appropriate  Cognitive:  Appropriate  Insight: Appropriate  Engagement in Group:  Engaged  Modes of Intervention:  Activity  Additional Comments:    Crystal Black 04/24/2023, 6:08 PM

## 2023-04-24 NOTE — [facility] Counselor (Signed)
CSW contacted guardian, [name] [phone]) regarding pt information that she was discharging tomorrow and was going to another group home. [name] informed CSW that pt was not set for discharge and had been told something different. [name] asked to have a phone conference with her, the pt, care coordinator, and CSW. Call set for tomorrow at 10:30AM via team. No other concerns expressed. Contact ended without incident.   Vilma Meckel. Algis Greenhouse, MSW, LCSW, LCAS 04/24/2023 4:44 PM
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
## 2023-04-24 NOTE — Group Note (Signed)
[facility] LCSW Group Therapy Note    Group Date: 04/24/2023 Start Time: 1330 End Time: 1430  Type of Therapy and Topic:  Group Therapy:  Overcoming Obstacles  Participation Level:  [facility] PARTICIPATION LEVEL: Active  Mood:  Description of Group:   In this group patients will be encouraged to explore what they see as obstacles to their own wellness and recovery. They will be guided to discuss their thoughts, feelings, and behaviors related to these obstacles. The group will process together ways to cope with barriers, with attention given to specific choices patients can make. Each patient will be challenged to identify changes they are motivated to make in order to overcome their obstacles. This group will be process-oriented, with patients participating in exploration of their own experiences as well as giving and receiving support and challenge from other group members.  Therapeutic Goals: 1. Patient will identify personal and current obstacles as they relate to admission. 2. Patient will identify barriers that currently interfere with their wellness or overcoming obstacles.  3. Patient will identify feelings, thought process and behaviors related to these barriers. 4. Patient will identify two changes they are willing to make to overcome these obstacles:    Summary of Patient Progress Patient was present in group.  Patient was an active participant.  Patient was able to engage in discussion on obstacles and identified that her willingness to help others, often at her own detriment She was receptive to feedback from others.    Therapeutic Modalities:   Cognitive Behavioral Therapy Solution Focused Therapy Motivational Interviewing Relapse Prevention Therapy   Harden Mo, LCSW

## 2023-04-24 NOTE — Progress Notes (Signed)
Patient received one time dose of magnesium citrate. Patient will update staff on constipation.
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
## 2023-04-24 NOTE — Group Note (Signed)
Date:  04/24/2023 Time:  9:52 AM  Group Topic/Focus:  Goals Group:   The focus of this group is to help patients establish daily goals to achieve during treatment and discuss how the patient can incorporate goal setting into their daily lives to aide in recovery.    Participation Level:  Did Not Attend   Lynelle Smoke [facility] 04/24/2023, 9:52 AM
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
## 2023-04-24 NOTE — Progress Notes (Signed)
Pt calm and pleasant during assessment denying SI/HI/AVH. Pt observed by this Clinical research associate interacting appropriately with staff and peers on the unit. Pt compliant with medication administration per MD orders. Pt given education, support, and encouragement to be active in her treatment plan. Pt being monitored Q 15 minutes for safety per unit protocol, remains safe on the unit

## 2023-04-24 NOTE — Progress Notes (Signed)
Patient appeared tired this morning and skipped breakfast but is out in dayroom this afternoon. Patient alert and med compliant. Patient pulled RN to discuss what happened last night and stated she felt like the RN was screaming at her last night which caused her to become "triggered and go off." Patient reminded of coping skills and appropriate behavior. Patient verbalized all understanding. Patient stated she did not want to go back home and her guardian has a new place for her to go. Patient stated she is looking forward to discharging tomorrow and appears in animated mood. Patient remains cooperative on unit with no aggressive behavior and no s/s of current distress.

## 2023-04-24 NOTE — Plan of Care (Signed)
  Problem: Education: Goal: Mental status will improve Outcome: Not Progressing   Problem: Coping: Goal: Ability to verbalize frustrations and anger appropriately will improve Outcome: Not Progressing   Problem: Coping: Goal: Ability to demonstrate self-control will improve Outcome: Not Progressing  Patient verbally aggressive towards staff, threatening staff members and not coping effectively.
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
## 2023-04-25 NOTE — Group Note (Signed)
[facility] LCSW Group Therapy Note   Group Date: 04/25/2023 Start Time: 1315 End Time: 1415  Type of Therapy/Topic:  Group Therapy:  Feelings about Diagnosis  Participation Level:  Active     Description of Group:    This group will allow patients to explore their thoughts and feelings about diagnoses they have received. Patients will be guided to explore their level of understanding and acceptance of these diagnoses. Facilitator will encourage patients to process their thoughts and feelings about the reactions of others to their diagnosis, and will guide patients in identifying ways to discuss their diagnosis with significant others in their lives. This group will be process-oriented, with patients participating in exploration of their own experiences as well as giving and receiving support and challenge from other group members.   Therapeutic Goals: 1. Patient will demonstrate understanding of diagnosis as evidence by identifying two or more symptoms of the disorder:  2. Patient will be able to express two feelings regarding the diagnosis 3. Patient will demonstrate ability to communicate their needs through discussion and/or role plays  Summary of Patient Progress: Patient was present for the entirety of the group process. She was actively engaged in the discussion during her time in the room. Pt appeared open and receptive to feedback/comments from both peers and facilitator.    Therapeutic Modalities:   Cognitive Behavioral Therapy Brief Therapy Feelings Identification    Glenis Smoker, LCSW

## 2023-04-25 NOTE — Group Note (Signed)
Date:  04/25/2023 Time:  9:09 PM  Group Topic/Focus:  Wrap-Up Group:   The focus of this group is to help patients review their daily goal of treatment and discuss progress on daily workbooks.    Participation Level:  Active  Participation Quality:  Appropriate and Attentive  Affect:  Appropriate  Cognitive:  Alert and Appropriate  Insight: Appropriate and Good  Engagement in Group:  Developing/Improving and Engaged  Modes of Intervention:  Activity, Socialization, and Support  Additional Comments:     Tanisha Lutes 04/25/2023, 9:09 PM

## 2023-04-25 NOTE — Progress Notes (Signed)
Patient denies SI,HI, and A/V/H with no plan or intent. Patient appears focused on her discharge plans but verbalizes understanding discharge process. Patient demonstrating adequate appetite and improved hydration today. Patient stated she finally had a bowel movement last night with no further issues. Patient remains cooperative on unit.

## 2023-04-25 NOTE — Group Note (Signed)
Date:  04/25/2023 Time:  6:08 PM  Group Topic/Focus:  Outdoor recreation structured activity    Participation Level:  Did Not Attend   Crystal Black 04/25/2023, 6:08 PM

## 2023-04-25 NOTE — Group Note (Signed)
Date:  04/25/2023 Time:  3:03 AM  Group Topic/Focus:  Wrap-Up Group:   The focus of this group is to help patients review their daily goal of treatment and discuss progress on daily workbooks.    Participation Level:  Active  Participation Quality:  Appropriate and Attentive  Affect:  Appropriate  Cognitive:  Alert and Appropriate  Insight: Appropriate  Engagement in Group:  Engaged  Modes of Intervention:  Discussion  Additional Comments:     Maglione,Daneen Volcy E 04/25/2023, 3:03 AM

## 2023-04-25 NOTE — Progress Notes (Signed)
Nursing Shift Note:  1900-0700  Attended Evening Group: Yes Medication Compliant:  Yes Behavior: cooperative Sleep Quality: good Significant Changes: none noted/reported    04/25/23 2300  Psych Admission Type (Psych Patients Only)  Admission Status Voluntary  Psychosocial Assessment  Patient Complaints None  Eye Contact Fair  Facial Expression Sad  Affect Appropriate to circumstance  Speech Soft  Interaction Childlike  Motor Activity Slow  Appearance/Hygiene Unremarkable  Behavior Characteristics Cooperative  Mood Pleasant;Sad  Thought Administrator, sports thinking  Content WDL  Delusions None reported or observed  Perception WDL  Hallucination None reported or observed  Judgment Impaired  Confusion None  Danger to Self  Current suicidal ideation? Denies  Danger to Others  Danger to Others None reported or observed

## 2023-04-25 NOTE — Plan of Care (Signed)
Pt denies SI/HI/AVH and is scheduled to D/C today  Problem: Education: Goal: Knowledge of [facility] Education information/materials will improve Outcome: Progressing Goal: Emotional status will improve Outcome: Progressing Goal: Mental status will improve Outcome: Progressing Goal: Verbalization of understanding the information provided will improve Outcome: Progressing   Problem: Activity: Goal: Interest or engagement in activities will improve Outcome: Progressing Goal: Sleeping patterns will improve Outcome: Progressing   Problem: Coping: Goal: Ability to verbalize frustrations and anger appropriately will improve Outcome: Progressing Goal: Ability to demonstrate self-control will improve Outcome: Progressing   Problem: Health Behavior/Discharge Planning: Goal: Identification of resources available to assist in meeting health care needs will improve Outcome: Progressing Goal: Compliance with treatment plan for underlying cause of condition will improve Outcome: Progressing   Problem: Physical Regulation: Goal: Ability to maintain clinical measurements within normal limits will improve Outcome: Progressing   Problem: Safety: Goal: Periods of time without injury will increase Outcome: Progressing   Problem: Education: Goal: Knowledge of [facility] Education information/materials will improve Outcome: Progressing Goal: Emotional status will improve Outcome: Progressing Goal: Mental status will improve Outcome: Progressing Goal: Verbalization of understanding the information provided will improve Outcome: Progressing   Problem: Activity: Goal: Interest or engagement in activities will improve Outcome: Progressing Goal: Sleeping patterns will improve Outcome: Progressing   Problem: Coping: Goal: Ability to verbalize frustrations and anger appropriately will improve Outcome: Progressing Goal: Ability to demonstrate self-control will  improve Outcome: Progressing   Problem: Health Behavior/Discharge Planning: Goal: Identification of resources available to assist in meeting health care needs will improve Outcome: Progressing Goal: Compliance with treatment plan for underlying cause of condition will improve Outcome: Progressing   Problem: Physical Regulation: Goal: Ability to maintain clinical measurements within normal limits will improve Outcome: Progressing   Problem: Safety: Goal: Periods of time without injury will increase Outcome: Progressing   Problem: Education: Goal: Ability to make informed decisions regarding treatment will improve Outcome: Progressing   Problem: Coping: Goal: Coping ability will improve Outcome: Progressing   Problem: Health Behavior/Discharge Planning: Goal: Identification of resources available to assist in meeting health care needs will improve Outcome: Progressing   Problem: Medication: Goal: Compliance with prescribed medication regimen will improve Outcome: Progressing   Problem: Self-Concept: Goal: Ability to disclose and discuss suicidal ideas will improve Outcome: Progressing Goal: Will verbalize positive feelings about self Outcome: Progressing Note: Patient is on track. Patient will maintain adherence    Problem: Activity: Goal: Interest or engagement in leisure activities will improve Outcome: Progressing   Problem: Coping: Goal: Coping ability will improve Outcome: Progressing   Problem: Role Relationship: Goal: Will demonstrate positive changes in social behaviors and relationships Outcome: Progressing

## 2023-04-25 NOTE — Group Note (Signed)
Date:  04/25/2023 Time:  10:29 AM  Group Topic/Focus:  Building Self Esteem:   The Focus of this group is helping patients become aware of the effects of self-esteem on their lives, the things they and others do that enhance or undermine their self-esteem, seeing the relationship between their level of self-esteem and the choices they make and learning ways to enhance self-esteem. Emotional Education:   The focus of this group is to discuss what feelings/emotions are, and how they are experienced. Managing Feelings:   The focus of this group is to identify what feelings patients have difficulty handling and develop a plan to handle them in a healthier way upon discharge.    Participation Level:  Did Not Attend   Crystal Black 04/25/2023, 10:29 AM

## 2023-04-25 NOTE — Plan of Care (Signed)
  Problem: Coping: Goal: Ability to verbalize frustrations and anger appropriately will improve Outcome: Progressing   Problem: Health Behavior/Discharge Planning: Goal: Compliance with treatment plan for underlying cause of condition will improve Outcome: Progressing   Problem: Safety: Goal: Periods of time without injury will increase Outcome: Progressing   

## 2023-04-25 NOTE — Group Note (Signed)
Recreation Therapy Group Note   Group Topic:Self-Esteem  Group Date: 04/25/2023 Start Time: 1010 End Time: 1105 Facilitators: Rosina Lowenstein, LRT, CTRS Location:  Craft Room  Group Description: My strengths and Qualities. Patients and LRT discussed the importance of self-love/self-esteem and things that cause it to fluctuate, including our mental health or state. Pt completed a worksheet that helps them identify 24 different strengths and qualities about themselves. Pt encouraged to read aloud at least 3 off their sheet to the group. LRT and pts discussed how this can be applied to daily life post-discharge.  Pt's then played "Positive Affirmation Bingo" afterwards, with stress balls as prizes.     Goal Area(s) Addressed: Patient will identify positive qualities about themselves. Patient will learn new positive affirmations.  Patient will recite positive qualities and affirmations aloud to the group.  Patient will practice positive self-talk.  Patient will increase communication.  Affect/Mood: N/A   Participation Level: Did not attend    Clinical Observations/Individualized Feedback: Nila did not attend group.  Plan: Continue to engage patient in RT group sessions 2-3x/week.   Rosina Lowenstein, LRT, CTRS 04/25/2023 11:24 AM

## 2023-04-26 NOTE — Group Note (Signed)
Date:  04/26/2023 Time:  9:05 PM  Group Topic/Focus:  Stages of Change:   The focus of this group is to explain the stages of change and help patients identify changes they want to make upon discharge.    Participation Level:  Active  Participation Quality:  Appropriate and Attentive  Affect:  Appropriate  Cognitive:  Alert and Appropriate  Insight: Appropriate and Good  Engagement in Group:  Developing/Improving and Engaged  Modes of Intervention:  Clarification, Discussion, Education, Problem-solving, Socialization, and Support  Additional Comments:     Haidan Nhan 04/26/2023, 9:05 PM

## 2023-04-26 NOTE — Group Note (Signed)
Date:  04/26/2023 Time:  6:56 PM  Group Topic/Focus:  Wellness Toolbox:   The focus of this group is to discuss various aspects of wellness, balancing those aspects and exploring ways to increase the ability to experience wellness.  Patients will create a wellness toolbox for use upon discharge.    Participation Level:  Active  Participation Quality:  Appropriate  Affect:  Appropriate  Cognitive:  Appropriate  Insight: Appropriate  Engagement in Group:  Engaged  Modes of Intervention:  Activity  Additional Comments:    Wilford Corner 04/26/2023, 6:56 PM

## 2023-04-26 NOTE — [facility] Counselor (Signed)
CSW called patients guardian.   CSW informed of plan to discharge patient to her group home.    Guardian reported that she is NOT aligned with the patient being discharged as "no date was given yesterday during the meeting".  She reports that because there was no date given and patient expressed a desire to be discharged to another home than her regular group home that patient CAN NOT be discharged to the group home that she came from.  Guardian reports that she will need to reach out to the patients [facility] Care Coordinator on placement and will have to follow up with this Clinical research associate.  She could not provide this Clinical research associate an ETA.  CSW reiterated that patient is scheduled for discharge.  CSW notes that barrier to discharge is the guardian declining and the necessary paperwork not being completed by the patient's guardian/Care Coordinator.    [name], MSW, LCSW 04/26/2023 12:47 PM
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
## 2023-04-26 NOTE — Plan of Care (Signed)
  Problem: Education: Goal: Mental status will improve 04/26/2023 0112 by Elmon Kirschner, RN Outcome: Progressing 04/26/2023 0112 by Elmon Kirschner, RN Outcome: Progressing   Problem: Education: Goal: Emotional status will improve 04/26/2023 0112 by Elmon Kirschner, RN Outcome: Progressing 04/26/2023 0112 by Elmon Kirschner, RN Outcome: Progressing   Problem: Education: Goal: Verbalization of understanding the information provided will improve 04/26/2023 0112 by Elmon Kirschner, RN Outcome: Progressing 04/26/2023 0112 by Elmon Kirschner, RN Outcome: Progressing

## 2023-04-26 NOTE — Group Note (Signed)
Recreation Therapy Group Note   Group Topic:Coping Skills  Group Date: 04/26/2023 Start Time: 1010 End Time: 1110 Facilitators: Rosina Lowenstein, LRT, CTRS Location:  Craft Room  Group Description: Mind Map.  Patient was provided a blank template of a diagram with 32 blank boxes in a tiered system, branching from the center (similar to a bubble chart). LRT directed patients to label the middle of the diagram "Coping Skills". LRT and patients then came up with 8 different coping skills as examples. Pt were directed to record their coping skills in the 2nd tier boxes closest to the center.  Patients would then share their coping skills with the group as LRT wrote them out. LRT gave a handout of 99 different coping skills at the end of group.   Goal Area(s) Addressed: Patients will be able to define "coping skills". Patient will identify new coping skills.  Patient will increase communication.   Affect/Mood: Appropriate   Participation Level: Minimal    Clinical Observations/Individualized Feedback: Patient came to group late and left after being present for 5 minutes.   Plan: Continue to engage patient in RT group sessions 2-3x/week.   Rosina Lowenstein, LRT, CTRS 04/26/2023 11:24 AM

## 2023-04-26 NOTE — BH IP Treatment Plan (Signed)
Interdisciplinary Treatment and Diagnostic Plan Update  04/26/2023 Time of Session: 9:00AM Crystal Black MRN: [id number]  Principal Diagnosis: Severe episode of recurrent major depressive disorder, without psychotic features (HCC)  Secondary Diagnoses: Principal Problem:   Severe episode of recurrent major depressive disorder, without psychotic features (HCC) Active Problems:   Borderline personality disorder (HCC)   Suicidal ideations   Current Medications:  Current Facility-Administered Medications  Medication Dose Route Frequency Provider Last Rate Last Admin   albuterol (VENTOLIN HFA) 108 (90 Base) MCG/ACT inhaler 2 puff  2 puff Inhalation Q4H PRN Charm Rings, NP   2 puff at 04/23/23 1347   darunavir-cobicistat (PREZCOBIX) 800-150 MG per tablet 1 tablet  1 tablet Oral Q breakfast Charm Rings, NP   1 tablet at 04/26/23 1031   diphenhydrAMINE (BENADRYL) capsule 50 mg  50 mg Oral TID PRN Sindy Guadeloupe, NP       Or   diphenhydrAMINE (BENADRYL) injection 50 mg  50 mg Intramuscular TID PRN Sindy Guadeloupe, NP   50 mg at 04/23/23 2032   divalproex (DEPAKOTE) DR tablet 500 mg  500 mg Oral TID Charm Rings, NP   500 mg at 04/26/23 1230   docusate sodium (COLACE) capsule 200 mg  200 mg Oral Daily PRN Jearld Lesch, NP       dolutegravir (TIVICAY) tablet 50 mg  50 mg Oral Daily Charm Rings, NP   50 mg at 04/26/23 1031   FLUoxetine (PROZAC) capsule 20 mg  20 mg Oral Daily Sarina Ill, DO   20 mg at 04/26/23 4098   hydrOXYzine (ATARAX) tablet 25 mg  25 mg Oral TID PRN Charm Rings, NP   25 mg at 04/22/23 1451   LORazepam (ATIVAN) tablet 2 mg  2 mg Oral TID PRN Sindy Guadeloupe, NP   2 mg at 04/24/23 2258   Or   LORazepam (ATIVAN) injection 2 mg  2 mg Intramuscular TID PRN Sindy Guadeloupe, NP   2 mg at 04/23/23 2031   nicotine polacrilex (NICORETTE) gum 2 mg  2 mg Oral PRN Sarina Ill, DO   2 mg at 04/26/23 1230   risperiDONE (RISPERDAL) tablet 0.5 mg  0.5  mg Oral [id number] Sarina Ill, DO   0.5 mg at 04/26/23 1191   topiramate (TOPAMAX) tablet 50 mg  50 mg Oral BID Sarina Ill, DO   50 mg at 04/26/23 1032   traZODone (DESYREL) tablet 50 mg  50 mg Oral QHS Charm Rings, NP   50 mg at 04/25/23 2120   PTA Medications: Medications Prior to Admission  Medication Sig Dispense Refill Last Dose   albuterol (VENTOLIN HFA) 108 (90 Base) MCG/ACT inhaler Inhale 2 puffs into the lungs every 4 (four) hours as needed for wheezing or shortness of breath.      benztropine (COGENTIN) 0.5 MG tablet Take 1 tablet (0.5 mg total) by mouth 2 (two) times daily. (Patient taking differently: Take 0.5 mg by mouth at bedtime.) 60 tablet 1    darunavir-cobicistat (PREZCOBIX) 800-150 MG tablet Take 1 tablet by mouth daily with breakfast. Swallow whole. Do NOT crush, break or chew tablets. Take with food. 30 tablet 1    divalproex (DEPAKOTE) 500 MG DR tablet Take 1 tablet (500 mg total) by mouth every 12 (twelve) hours. (Patient taking differently: Take 500 mg by mouth 3 (three) times daily.) 60 tablet 1    docusate sodium (COLACE) 100 MG capsule Take 300 mg by mouth daily.  dolutegravir (TIVICAY) 50 MG tablet Take 1 tablet (50 mg total) by mouth daily. 30 tablet 1    EPINEPHrine 0.3 mg/0.3 mL IJ SOAJ injection Inject 0.3 mg into the muscle as needed for anaphylaxis.      etonogestrel (NEXPLANON) 68 MG IMPL implant 1 each (68 mg total) by Subdermal route once for 1 dose. 1 each 0    famotidine (PEPCID) 20 MG tablet Take 20 mg by mouth daily as needed for heartburn.      Fiber Adult Gummies 2 g CHEW Chew 1 Units by mouth 2 (two) times daily.      FLUoxetine (PROZAC) 40 MG capsule Take 40 mg by mouth at bedtime.      glycerin adult 2 g suppository Place 1 suppository rectally as needed for constipation.      hydrOXYzine (ATARAX) 25 MG tablet Take 1 tablet (25 mg total) by mouth 3 (three) times daily as needed. 60 tablet 1    ibuprofen (ADVIL) 800 MG  tablet Take 1 tablet (800 mg total) by mouth every 8 (eight) hours as needed for moderate pain. 15 tablet 0    INVEGA SUSTENNA 156 MG/ML SUSY injection Inject 234 mg into the muscle every 30 (thirty) days.      linaclotide (LINZESS) 290 MCG CAPS capsule Take 290 mcg by mouth daily before breakfast.      magnesium citrate SOLN Take 1 Bottle by mouth once.      methocarbamol (ROBAXIN) 500 MG tablet Take 500 mg by mouth daily as needed (back pain).      montelukast (SINGULAIR) 10 MG tablet Take 10 mg by mouth daily.      nicotine polacrilex (NICORETTE) 2 MG gum Take 2 mg by mouth as needed for smoking cessation.      prazosin (MINIPRESS) 5 MG capsule Take 1 capsule (5 mg total) by mouth at bedtime. 30 capsule 1    SODIUM FLUORIDE, DENTAL RINSE, 0.2 % SOLN Use as directed 10 mLs in the mouth or throat at bedtime.      topiramate (TOPAMAX) 50 MG tablet Take 1 tablet (50 mg total) by mouth 2 (two) times daily. (Patient taking differently: Take 50 mg by mouth at bedtime.) 60 tablet 1    traZODone (DESYREL) 50 MG tablet Take 50 mg by mouth at bedtime.      triamcinolone cream (KENALOG) 0.1 % Apply 1 Application topically 2 (two) times daily. Right foot       Patient Stressors:    Patient Strengths:    Treatment Modalities: Medication Management, Group therapy, Case management,  1 to 1 session with clinician, Psychoeducation, Recreational therapy.   Physician Treatment Plan for Primary Diagnosis: Severe episode of recurrent major depressive disorder, without psychotic features (HCC) Long Term Goal(s): Improvement in symptoms so as ready for discharge   Short Term Goals: Ability to identify changes in lifestyle to reduce recurrence of condition will improve Ability to verbalize feelings will improve Ability to disclose and discuss suicidal ideas Ability to demonstrate self-control will improve Ability to identify and develop effective coping behaviors will improve Ability to maintain clinical  measurements within normal limits will improve Compliance with prescribed medications will improve Ability to identify triggers associated with substance abuse/mental health issues will improve  Medication Management: Evaluate patient's response, side effects, and tolerance of medication regimen.  Therapeutic Interventions: 1 to 1 sessions, Unit Group sessions and Medication administration.  Evaluation of Outcomes: Progressing  Physician Treatment Plan for Secondary Diagnosis: Principal Problem:   Severe episode  of recurrent major depressive disorder, without psychotic features (HCC) Active Problems:   Borderline personality disorder (HCC)   Suicidal ideations  Long Term Goal(s): Improvement in symptoms so as ready for discharge   Short Term Goals: Ability to identify changes in lifestyle to reduce recurrence of condition will improve Ability to verbalize feelings will improve Ability to disclose and discuss suicidal ideas Ability to demonstrate self-control will improve Ability to identify and develop effective coping behaviors will improve Ability to maintain clinical measurements within normal limits will improve Compliance with prescribed medications will improve Ability to identify triggers associated with substance abuse/mental health issues will improve     Medication Management: Evaluate patient's response, side effects, and tolerance of medication regimen.  Therapeutic Interventions: 1 to 1 sessions, Unit Group sessions and Medication administration.  Evaluation of Outcomes: Progressing   RN Treatment Plan for Primary Diagnosis: Severe episode of recurrent major depressive disorder, without psychotic features (HCC) Long Term Goal(s): Knowledge of disease and therapeutic regimen to maintain health will improve  Short Term Goals: Ability to demonstrate self-control, Ability to participate in decision making will improve, Ability to verbalize feelings will improve, Ability  to disclose and discuss suicidal ideas, Ability to identify and develop effective coping behaviors will improve, and Compliance with prescribed medications will improve  Medication Management: RN will administer medications as ordered by provider, will assess and evaluate patient's response and provide education to patient for prescribed medication. RN will report any adverse and/or side effects to prescribing provider.  Therapeutic Interventions: 1 on 1 counseling sessions, Psychoeducation, Medication administration, Evaluate responses to treatment, Monitor vital signs and CBGs as ordered, Perform/monitor CIWA, COWS, AIMS and Fall Risk screenings as ordered, Perform wound care treatments as ordered.  Evaluation of Outcomes: Progressing   LCSW Treatment Plan for Primary Diagnosis: Severe episode of recurrent major depressive disorder, without psychotic features (HCC) Long Term Goal(s): Safe transition to appropriate next level of care at discharge, Engage patient in therapeutic group addressing interpersonal concerns.  Short Term Goals: Engage patient in aftercare planning with referrals and resources, Increase social support, Increase ability to appropriately verbalize feelings, Increase emotional regulation, Facilitate acceptance of mental health diagnosis and concerns, and Increase skills for wellness and recovery  Therapeutic Interventions: Assess for all discharge needs, 1 to 1 time with Social worker, Explore available resources and support systems, Assess for adequacy in community support network, Educate family and significant other(s) on suicide prevention, Complete Psychosocial Assessment, Interpersonal group therapy.  Evaluation of Outcomes: Progressing   Progress in Treatment: Attending groups: Yes. Participating in groups: Yes. Taking medication as prescribed: Yes. Toleration medication: Yes. Family/Significant other contact made: Yes, individual(s) contacted:  SPE completed with  the patient's guardian.  Patient understands diagnosis: Yes. Discussing patient identified problems/goals with staff: Yes. Medical problems stabilized or resolved: Yes. Denies suicidal/homicidal ideation: Yes. Issues/concerns per patient self-inventory: No. Other: none  New problem(s) identified: No, Describe:  None identified  Update 04/26/2023:  No changes at this time.    New Short Term/Long Term Goal(s):  elimination of symptoms of psychosis, medication management for mood stabilization; elimination of SI thoughts; development of comprehensive mental wellness plan.  Update 04/26/2023:  No changes at this time.    Patient Goals:  "The biggest problem is learning how to cope with my anger and emotions" Update 04/26/2023:  No changes at this time.    Discharge Plan or Barriers: CSW will assist with appropriate discharge planning Update 04/26/2023:  Patient was scheduled to discharge on 04/26/2023 however,  guardian is declining to have patient discharge.  She reports that because she was unaware of an expected discharge the necessary paperwork has not been completed to transfer patient to an alternative living space.  She reports that she is unable to provide an ETA on when this paperwork will be completed.  Patient remains safe on the unit.    Reason for Continuation of Hospitalization: Aggression Homicidal ideation Medication stabilization Suicidal ideation   Estimated Length of Stay: 1 to 7 days  Update 04/26/2023:  TBD   Last 3 [place] Suicide Severity Risk Score: Flowsheet Row ED from 04/20/2023 in [facility] Most recent reading at 04/21/2023  6:14 AM Admission (Current) from 04/21/2023 in [facility] INPATIENT BEHAV[facility]AL [facility]ICINE Most recent reading at 04/21/2023  2:02 AM Admission (Discharged) from 03/07/2023 in [facility] INPATIENT BEHAVIORAL MEDICINE Most recent reading at 03/07/2023 10:11 PM  C-SSRS RISK CATEGORY High Risk Low Risk Low Risk       Last PHQ  2/9 Scores:    02/16/2023    9:49 AM 11/17/2022   10:53 AM 05/26/2022   12:03 PM  Depression screen PHQ 2/9  Decreased Interest 0 0 0  Down, Depressed, Hopeless 0 1 1  PHQ - 2 Score 0 1 1    Scribe for Treatment Team: Harden Mo, LCSW 04/26/2023 1:25 PM

## 2023-04-26 NOTE — Group Note (Signed)
Date:  04/26/2023 Time:  2:13 PM  Group Topic/Focus:  Goals Group:   The focus of this group is to help patients establish daily goals to achieve during treatment and discuss how the patient can incorporate goal setting into their daily lives to aide in recovery.    Participation Level:  Minimal  Participation Quality:  Appropriate  Affect:  Appropriate  Cognitive:  Appropriate  Insight: Appropriate  Engagement in Group:  Engaged  Modes of Intervention:  Discussion, Education, and Support  Additional Comments:    Wilford Corner 04/26/2023, 2:13 PM
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
## 2023-04-26 NOTE — Plan of Care (Signed)
  Problem: Coping: Goal: Ability to verbalize frustrations and anger appropriately will improve Outcome: Progressing   Problem: Health Behavior/Discharge Planning: Goal: Compliance with treatment plan for underlying cause of condition will improve Outcome: Progressing   Problem: Safety: Goal: Periods of time without injury will increase Outcome: Progressing   

## 2023-04-26 NOTE — Progress Notes (Signed)
Pt calm and pleasant during assessment denying SI/HI/AVH. Pt observed by this Clinical research associate interacting appropriately with staff and peers on the unit. Pt compliant with medication administration per MD orders. Pt given education, support, and encouragement to be active in her treatment plan. Pt being monitored Q 15 minutes for safety per unit protocol, remains safe on the unit

## 2023-04-27 DIAGNOSIS — F332 Major depressive disorder, recurrent severe without psychotic features: Secondary | ICD-10-CM | POA: Diagnosis not present

## 2023-04-27 MED ORDER — DOCUSATE SODIUM 50 MG/5ML PO LIQD
50.0000 mg | Freq: Every day | ORAL | Status: DC
  Administered 2023-04-27 – 2023-05-03 (×7): 50 mg via ORAL
  Filled 2023-04-27 (×7): qty 10

## 2023-04-27 MED ORDER — FAMOTIDINE 20 MG PO TABS
20.0000 mg | ORAL_TABLET | Freq: Every day | ORAL | Status: DC
  Administered 2023-04-27 – 2023-05-03 (×7): 20 mg via ORAL
  Filled 2023-04-27 (×7): qty 1

## 2023-04-27 MED ORDER — MAGNESIUM HYDROXIDE 400 MG/5ML PO SUSP
30.0000 mL | Freq: Every evening | ORAL | Status: DC | PRN
  Administered 2023-04-27 – 2023-05-02 (×5): 30 mL via ORAL
  Filled 2023-04-27 (×5): qty 30

## 2023-04-27 MED ORDER — ONDANSETRON 4 MG PO TBDP
4.0000 mg | ORAL_TABLET | Freq: Two times a day (BID) | ORAL | Status: DC
  Administered 2023-04-27 – 2023-05-03 (×12): 4 mg via ORAL
  Filled 2023-04-27 (×12): qty 1

## 2023-04-27 NOTE — Plan of Care (Signed)
Pt denies SI/HI/AVH, compliant with procedures on the unit  Problem: Education: Goal: Knowledge of [facility] [facility] Education information/materials will improve Outcome: Progressing Goal: Emotional status will improve Outcome: Progressing Goal: Mental status will improve Outcome: Progressing Goal: Verbalization of understanding the information provided will improve Outcome: Progressing   Problem: Activity: Goal: Interest or engagement in activities will improve Outcome: Progressing Goal: Sleeping patterns will improve Outcome: Progressing   Problem: Coping: Goal: Ability to verbalize frustrations and anger appropriately will improve Outcome: Progressing Goal: Ability to demonstrate self-control will improve Outcome: Progressing   Problem: Health Behavior/Discharge Planning: Goal: Identification of resources available to assist in meeting health care needs will improve Outcome: Progressing Goal: Compliance with treatment plan for underlying cause of condition will improve Outcome: Progressing   Problem: Physical Regulation: Goal: Ability to maintain clinical measurements within normal limits will improve Outcome: Progressing   Problem: Safety: Goal: Periods of time without injury will increase Outcome: Progressing   Problem: Education: Goal: Knowledge of [place] [facility] Education information/materials will improve Outcome: Progressing Goal: Emotional status will improve Outcome: Progressing Goal: Mental status will improve Outcome: Progressing Goal: Verbalization of understanding the information provided will improve Outcome: Progressing   Problem: Activity: Goal: Interest or engagement in activities will improve Outcome: Progressing Goal: Sleeping patterns will improve Outcome: Progressing   Problem: Coping: Goal: Ability to verbalize frustrations and anger appropriately will improve Outcome: Progressing Goal: Ability to demonstrate self-control will  improve Outcome: Progressing   Problem: Health Behavior/Discharge Planning: Goal: Identification of resources available to assist in meeting health care needs will improve Outcome: Progressing Goal: Compliance with treatment plan for underlying cause of condition will improve Outcome: Progressing   Problem: Physical Regulation: Goal: Ability to maintain clinical measurements within normal limits will improve Outcome: Progressing   Problem: Safety: Goal: Periods of time without injury will increase Outcome: Progressing   Problem: Education: Goal: Ability to make informed decisions regarding treatment will improve Outcome: Progressing   Problem: Coping: Goal: Coping ability will improve Outcome: Progressing   Problem: Health Behavior/Discharge Planning: Goal: Identification of resources available to assist in meeting health care needs will improve Outcome: Progressing   Problem: Medication: Goal: Compliance with prescribed medication regimen will improve Outcome: Progressing   Problem: Self-Concept: Goal: Ability to disclose and discuss suicidal ideas will improve Outcome: Progressing Goal: Will verbalize positive feelings about self Outcome: Progressing Note: Patient is on track. Patient will maintain adherence    Problem: Activity: Goal: Interest or engagement in leisure activities will improve Outcome: Progressing   Problem: Coping: Goal: Coping ability will improve Outcome: Progressing   Problem: Role Relationship: Goal: Will demonstrate positive changes in social behaviors and relationships Outcome: Progressing

## 2023-04-27 NOTE — Progress Notes (Signed)
Pt became agitated when another patient got on her nerves. Pt was compliant with PRN agitation medication. Check MAR

## 2023-04-27 NOTE — Progress Notes (Addendum)
Patient denies SI, HI, and AVH. She is calm and cooperative with assessment. She denies pain. She asks for something for constipation and says she has not had a BM for 2 days. Colace was given. Patient is compliant with scheduled medications.  Patient began coughing in the dayroom during dinnertime and said she was feeling nauseous. Patient said she was choking but did not appear to be choking and was able to clear airway. When she was told to take deep breaths, she began hyperventilating. Staff helped verbally de-escalate and patient was able to calm down and return to dinner. She was also given hydroxyzine PRN, with relief.   Patient later stated that she needed a shot for anxiety because of a conflict with another patient on the unit. Patient was able to talk through this emotions and returned to group in the courtyard.
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
## 2023-04-27 NOTE — Group Note (Signed)
Date:  04/27/2023 Time:  10:01 PM  Group Topic/Focus:  Making Healthy Choices:   The focus of this group is to help patients identify negative/unhealthy choices they were using prior to admission and identify positive/healthier coping strategies to replace them upon discharge.    Participation Level:  Did Not Attend  Participation Quality:   none  Affect:   none  Cognitive:   none  Insight: None  Engagement in Group:   none  Modes of Intervention:   none  Additional Comments:  none   Tityana Pagan 04/27/2023, 10:01 PM

## 2023-04-27 NOTE — BHH Counselor (Signed)
Patient's guardian reports, that at this time the patients plans "has changed" and as a result "it is being sent to [facility]" for review.   She reports that this can take up to 14 days. She reports that the [facility] is going to "expedite" the paperwork if possible.  She reports that it is unclear at this time if patient can return to the group home in the interim and "this is a last resort".  She reports that she will follow up.  [address] Legal Guardian, [phone]   [name], MSW, LCSW 04/27/2023 3:16 PM

## 2023-04-27 NOTE — Group Note (Signed)
Date:  04/27/2023 Time:  6:32 PM  Group Topic/Focus:  Goals Group:   The focus of this group is to help patients establish daily goals to achieve during treatment and discuss how the patient can incorporate goal setting into their daily lives to aide in recovery.   Participation Level:  Active  Participation Quality:  Appropriate  Affect:  Appropriate  Cognitive:  Appropriate  Insight: Appropriate  Engagement in Group:  Engaged  Modes of Intervention:  Discussion and Education  Additional Comments:    Syreeta Figler A Savayah Waltrip 04/27/2023, 6:32 PM

## 2023-04-27 NOTE — Progress Notes (Signed)
Pt calm and pleasant during assessment denying SI/HI/AVH. Pt observed by this Clinical research associate interacting appropriately with staff and peers on the unit. Pt compliant with medication administration per MD orders. Pt given education, support, and encouragement to be active in her treatment plan. Pt being monitored Q 15 minutes for safety per unit protocol, remains safe on the unit

## 2023-04-27 NOTE — Group Note (Signed)
Date:  04/27/2023 Time:  7:13 PM  Group Topic/Focus:  Activity Group:  The focus of the group is to encourage activity with the patients and have them go outside to the courtyard for some fresh air and some exercise.    Participation Level:  Active  Participation Quality:  Appropriate  Affect:  Appropriate  Cognitive:  Appropriate  Insight: Appropriate  Engagement in Group:  Engaged  Modes of Intervention:  Activity  Additional Comments:    Macarthur Niziolek Ethelene Closser 04/27/2023, 7:13 PM

## 2023-04-27 NOTE — Group Note (Signed)
Recreation Therapy Group Note   Group Topic:Other  Group Date: 04/27/2023 Start Time: 1000 End Time: 1100 Facilitators: Rosina Lowenstein, LRT, CTRS Location:  Craft Room  Group Description: Sport and exercise psychologist. Pts were given a card with a Halloween term on it and asked to draw it out on the dry erase board, one at a time and without using words or sound, for the group to guess what they have drawn. Once the image was successfully drawn, the next person would draw their term on the board. 2 mummies were identified voluntarily from the group. The rest of the group was split into two groups. Pts were encouraged to wrap the 2 mummies from head to toe with a roll of toilet paper. The team to do so fastest and with the whole roll of toilet paper, wins.   Goal Area(s) Addressed:  Patient will increase communication skills. Patient will engage in a team-building activity.  Patient will build frustration tolerance skills.  Patient will gain knowledge of an emotional expression activity, like drawing.    Affect/Mood: N/A   Participation Level: Did not attend    Clinical Observations/Individualized Feedback: Crystal Black did not attend group.   Plan: Continue to engage patient in RT group sessions 2-3x/week.   Rosina Lowenstein, LRT, CTRS 04/27/2023 11:22 AM

## 2023-04-28 DIAGNOSIS — F332 Major depressive disorder, recurrent severe without psychotic features: Secondary | ICD-10-CM | POA: Diagnosis not present

## 2023-04-28 MED ORDER — MAGNESIUM CITRATE PO SOLN
0.5000 | Freq: Once | ORAL | Status: AC
  Administered 2023-04-28: 0.5 via ORAL
  Filled 2023-04-28: qty 296
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
## 2023-04-28 NOTE — Progress Notes (Incomplete)
D: Pt alert and oriented. Pt denies experiencing any anxiety/depression at this time. Pt denies experiencing any pain at this time. Pt denies experiencing any SI/HI, or AVH at this time.    A: Scheduled medications administered to pt, per MD orders. Received benadryl 50 mg and Ativan 2 mg IM given at 1923 for agitation r/t verbal conflict w/peer. MOM 30 ml given at 2200 for c/o constipation (result pending). Support and encouragement provided. Frequent verbal contact made. Routine safety checks conducted q15 minutes.   R: No adverse drug reactions noted. Pt verbally contracts for safety at this time. Pt complaint with medications. Pt interacts appropriately with others, however, required brief verbal redirection for verbal conflict w/ peer on the unit. Offered Prn's Ativan and benadryl IM effective at reassessment. Pt remains safe at this time.

## 2023-04-28 NOTE — Progress Notes (Addendum)
Patient received her scheduled Depakote at 1017, two hours late, due to patient not coming up to get her medication. This writer will push back next two scheduled doses, per NP request.

## 2023-04-28 NOTE — Progress Notes (Signed)
D: Pt alert and oriented. Pt denies experiencing any anxiety/depression at this time. Pt denies experiencing any pain at this time. Pt denies experiencing any SI/HI, or AVH at this time.    A: Scheduled medications administered to pt, per MD orders. Received benadryl 50 mg and Ativan 2 mg IM given at 1923 for agitation r/t verbal conflict w/peer. MOM 30 ml given at 2200 for c/o constipation (result pending). Support and encouragement provided. Frequent verbal contact made. Routine safety checks conducted q15 minutes.   R: No adverse drug reactions noted. Pt verbally contracts for safety at this time. Pt complaint with medications. Pt interacts appropriately with others, however, required brief verbal redirection for verbal conflict w/ peer on the unit. Offered Prn's Ativan and benadryl IM effective at reassessment. Pt remains safe at this time.

## 2023-04-28 NOTE — Plan of Care (Signed)
Pt denies SI/HI/AVH, waiting safe D/C planning  Problem: Education: Goal: Knowledge of [facility] Education information/materials will improve Outcome: Progressing Goal: Emotional status will improve Outcome: Progressing Goal: Mental status will improve Outcome: Progressing Goal: Verbalization of understanding the information provided will improve Outcome: Progressing   Problem: Activity: Goal: Interest or engagement in activities will improve Outcome: Progressing Goal: Sleeping patterns will improve Outcome: Progressing   Problem: Coping: Goal: Ability to verbalize frustrations and anger appropriately will improve Outcome: Progressing Goal: Ability to demonstrate self-control will improve Outcome: Progressing   Problem: Health Behavior/Discharge Planning: Goal: Identification of resources available to assist in meeting health care needs will improve Outcome: Progressing Goal: Compliance with treatment plan for underlying cause of condition will improve Outcome: Progressing   Problem: Physical Regulation: Goal: Ability to maintain clinical measurements within normal limits will improve Outcome: Progressing   Problem: Safety: Goal: Periods of time without injury will increase Outcome: Progressing   Problem: Education: Goal: Knowledge of  General Education information/materials will improve Outcome: Progressing Goal: Emotional status will improve Outcome: Progressing Goal: Mental status will improve Outcome: Progressing Goal: Verbalization of understanding the information provided will improve Outcome: Progressing   Problem: Activity: Goal: Interest or engagement in activities will improve Outcome: Progressing Goal: Sleeping patterns will improve Outcome: Progressing   Problem: Coping: Goal: Ability to verbalize frustrations and anger appropriately will improve Outcome: Progressing Goal: Ability to demonstrate self-control will  improve Outcome: Progressing   Problem: Health Behavior/Discharge Planning: Goal: Identification of resources available to assist in meeting health care needs will improve Outcome: Progressing Goal: Compliance with treatment plan for underlying cause of condition will improve Outcome: Progressing   Problem: Physical Regulation: Goal: Ability to maintain clinical measurements within normal limits will improve Outcome: Progressing   Problem: Safety: Goal: Periods of time without injury will increase Outcome: Progressing   Problem: Education: Goal: Ability to make informed decisions regarding treatment will improve Outcome: Progressing   Problem: Coping: Goal: Coping ability will improve Outcome: Progressing   Problem: Health Behavior/Discharge Planning: Goal: Identification of resources available to assist in meeting health care needs will improve Outcome: Progressing   Problem: Medication: Goal: Compliance with prescribed medication regimen will improve Outcome: Progressing   Problem: Self-Concept: Goal: Ability to disclose and discuss suicidal ideas will improve Outcome: Progressing Goal: Will verbalize positive feelings about self Outcome: Progressing Note: Patient is on track. Patient will maintain adherence    Problem: Activity: Goal: Interest or engagement in leisure activities will improve Outcome: Progressing   Problem: Coping: Goal: Coping ability will improve Outcome: Progressing   Problem: Role Relationship: Goal: Will demonstrate positive changes in social behaviors and relationships Outcome: Progressing

## 2023-04-28 NOTE — Group Note (Signed)
Recreation Therapy Group Note   Group Topic:Leisure Education  Group Date: 04/28/2023 Start Time: 1100 End Time: 1200 Facilitators: Rosina Lowenstein, LRT, CTRS Location:  Craft Room  Group Description: Leisure. Patients were given the option to choose from singing karaoke, coloring mandalas, using oil pastels, journaling, or playing with play-doh. LRT and pts discussed the meaning of leisure, the importance of participating in leisure during their free time/when they're outside of the hospital, as well as how our leisure interests can also serve as coping skills.    Goal Area(s) Addressed:  Patient will identify a current leisure interest.  Patient will learn the definition of "leisure". Patient will practice making a positive decision. Patient will have the opportunity to try a new leisure activity. Patient will communicate with peers and LRT.    Affect/Mood: N/A   Participation Level: Did not attend    Clinical Observations/Individualized Feedback: Ladashia did not attend group.  Plan: Continue to engage patient in RT group sessions 2-3x/week.   Rosina Lowenstein, LRT, CTRS 04/28/2023 12:56 PM

## 2023-04-28 NOTE — Plan of Care (Signed)
  Problem: Education: Goal: Knowledge of [facility] Education information/materials will improve Outcome: Progressing Goal: Emotional status will improve Outcome: Progressing Goal: Mental status will improve Outcome: Progressing Goal: Verbalization of understanding the information provided will improve Outcome: Progressing   Problem: Activity: Goal: Interest or engagement in activities will improve Outcome: Progressing Goal: Sleeping patterns will improve Outcome: Progressing   Problem: Coping: Goal: Ability to verbalize frustrations and anger appropriately will improve Outcome: Progressing Goal: Ability to demonstrate self-control will improve Outcome: Progressing   Problem: Health Behavior/Discharge Planning: Goal: Identification of resources available to assist in meeting health care needs will improve Outcome: Progressing Goal: Compliance with treatment plan for underlying cause of condition will improve Outcome: Progressing   Problem: Physical Regulation: Goal: Ability to maintain clinical measurements within normal limits will improve Outcome: Progressing   Problem: Safety: Goal: Periods of time without injury will increase Outcome: Progressing   Problem: Education: Goal: Knowledge of [facility] Education information/materials will improve Outcome: Progressing Goal: Emotional status will improve Outcome: Progressing Goal: Mental status will improve Outcome: Progressing Goal: Verbalization of understanding the information provided will improve Outcome: Progressing   Problem: Activity: Goal: Interest or engagement in activities will improve Outcome: Progressing Goal: Sleeping patterns will improve Outcome: Progressing   Problem: Coping: Goal: Ability to verbalize frustrations and anger appropriately will improve Outcome: Progressing Goal: Ability to demonstrate self-control will improve Outcome: Progressing   Problem: Health  Behavior/Discharge Planning: Goal: Identification of resources available to assist in meeting health care needs will improve Outcome: Progressing Goal: Compliance with treatment plan for underlying cause of condition will improve Outcome: Progressing   Problem: Physical Regulation: Goal: Ability to maintain clinical measurements within normal limits will improve Outcome: Progressing   Problem: Safety: Goal: Periods of time without injury will increase Outcome: Progressing   Problem: Education: Goal: Ability to make informed decisions regarding treatment will improve Outcome: Progressing   Problem: Coping: Goal: Coping ability will improve Outcome: Progressing   Problem: Health Behavior/Discharge Planning: Goal: Identification of resources available to assist in meeting health care needs will improve Outcome: Progressing   Problem: Medication: Goal: Compliance with prescribed medication regimen will improve Outcome: Progressing   Problem: Self-Concept: Goal: Ability to disclose and discuss suicidal ideas will improve Outcome: Progressing   Problem: Activity: Goal: Interest or engagement in leisure activities will improve Outcome: Progressing   Problem: Coping: Goal: Coping ability will improve Outcome: Progressing   Problem: Role Relationship: Goal: Will demonstrate positive changes in social behaviors and relationships Outcome: Progressing

## 2023-04-28 NOTE — Group Note (Signed)
Date:  04/28/2023 Time:  3:32 PM  Group Topic/Focus:  Activity Group:  The focus of the group is to promote activity for the patients and allow them to go outside in the courtyard for some fresh air and some exercise.    Participation Level:  Did Not Attend   Crystal Black 04/28/2023, 3:32 PM

## 2023-04-28 NOTE — Progress Notes (Signed)
   04/28/23 1700  Psych Admission Type (Psych Patients Only)  Admission Status Voluntary  Psychosocial Assessment  Patient Complaints Anxiety  Eye Contact Fair  Facial Expression Anxious  Affect Appropriate to circumstance  Speech Logical/coherent;Soft  Interaction Childlike  Motor Activity Slow  Appearance/Hygiene In scrubs;Unremarkable  Behavior Characteristics Cooperative;Appropriate to situation  Mood Pleasant  Aggressive Behavior  Effect No apparent injury  Thought Process  Coherency Circumstantial  Content Blaming others (patient states that another patient on the unit is bothering/annoying her)  Delusions None reported or observed  Perception WDL  Hallucination None reported or observed  Judgment WDL  Confusion None  Danger to Self  Current suicidal ideation? Denies  Self-Injurious Behavior No self-injurious ideation or behavior indicators observed or expressed   Agreement Not to Harm Self Yes  Description of Agreement Verbal  Danger to Others  Danger to Others None reported or observed
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
## 2023-04-29 ENCOUNTER — Encounter: Payer: Self-pay | Admitting: Psychiatry

## 2023-04-29 NOTE — Plan of Care (Signed)
  Problem: Education: Goal: Knowledge of [facility] Education information/materials will improve Outcome: Progressing Goal: Emotional status will improve Outcome: Progressing Goal: Mental status will improve Outcome: Progressing Goal: Verbalization of understanding the information provided will improve Outcome: Progressing   Problem: Activity: Goal: Interest or engagement in activities will improve Outcome: Progressing Goal: Sleeping patterns will improve Outcome: Progressing   Problem: Coping: Goal: Ability to verbalize frustrations and anger appropriately will improve Outcome: Progressing Goal: Ability to demonstrate self-control will improve Outcome: Progressing   Problem: Health Behavior/Discharge Planning: Goal: Identification of resources available to assist in meeting health care needs will improve Outcome: Progressing Goal: Compliance with treatment plan for underlying cause of condition will improve Outcome: Progressing   Problem: Physical Regulation: Goal: Ability to maintain clinical measurements within normal limits will improve Outcome: Progressing   Problem: Safety: Goal: Periods of time without injury will increase Outcome: Progressing   Problem: Education: Goal: Knowledge of [facility] Education information/materials will improve Outcome: Progressing Goal: Emotional status will improve Outcome: Progressing Goal: Mental status will improve Outcome: Progressing Goal: Verbalization of understanding the information provided will improve Outcome: Progressing   Problem: Activity: Goal: Interest or engagement in activities will improve Outcome: Progressing Goal: Sleeping patterns will improve Outcome: Progressing   Problem: Coping: Goal: Ability to verbalize frustrations and anger appropriately will improve Outcome: Progressing Goal: Ability to demonstrate self-control will improve Outcome: Progressing   Problem: Health  Behavior/Discharge Planning: Goal: Identification of resources available to assist in meeting health care needs will improve Outcome: Progressing Goal: Compliance with treatment plan for underlying cause of condition will improve Outcome: Progressing   Problem: Physical Regulation: Goal: Ability to maintain clinical measurements within normal limits will improve Outcome: Progressing   Problem: Safety: Goal: Periods of time without injury will increase Outcome: Progressing   Problem: Education: Goal: Ability to make informed decisions regarding treatment will improve Outcome: Progressing   Problem: Coping: Goal: Coping ability will improve Outcome: Progressing   Problem: Health Behavior/Discharge Planning: Goal: Identification of resources available to assist in meeting health care needs will improve Outcome: Progressing   Problem: Medication: Goal: Compliance with prescribed medication regimen will improve Outcome: Progressing   Problem: Self-Concept: Goal: Ability to disclose and discuss suicidal ideas will improve Outcome: Progressing   Problem: Activity: Goal: Interest or engagement in leisure activities will improve Outcome: Progressing   Problem: Coping: Goal: Coping ability will improve Outcome: Progressing   Problem: Role Relationship: Goal: Will demonstrate positive changes in social behaviors and relationships Outcome: Progressing

## 2023-04-29 NOTE — Progress Notes (Signed)
   04/29/23 1300  Psych Admission Type (Psych Patients Only)  Admission Status Voluntary  Psychosocial Assessment  Patient Complaints None  Eye Contact Fair  Facial Expression Other (Comment) (appropriate)  Affect Appropriate to circumstance  Speech Logical/coherent  Interaction Childlike  Motor Activity Slow  Appearance/Hygiene In scrubs;Unremarkable  Behavior Characteristics Cooperative;Appropriate to situation  Mood Pleasant  Aggressive Behavior  Effect No apparent injury  Thought Process  Coherency Circumstantial  Content WDL  Delusions None reported or observed  Perception WDL  Hallucination None reported or observed  Judgment WDL  Confusion None  Danger to Self  Current suicidal ideation? Denies  Self-Injurious Behavior No self-injurious ideation or behavior indicators observed or expressed   Agreement Not to Harm Self Yes  Description of Agreement Verbal  Danger to Others  Danger to Others None reported or observed

## 2023-04-29 NOTE — Group Note (Signed)
Date:  04/29/2023 Time:  12:38 PM  Group Topic/Focus:  Goals Group:   The focus of this group is to help patients establish daily goals to achieve during treatment and discuss how the patient can incorporate goal setting into their daily lives to aide in recovery.    Participation Level:  Did Not Attend  Participation Quality:  Appropriate  Affect:  Appropriate  Cognitive:  Appropriate  Insight: Appropriate  Engagement in Group:  Engaged  Modes of Intervention:  Activity  Additional Comments:    Ainslie Mazurek 04/29/2023, 12:38 PM

## 2023-04-29 NOTE — Progress Notes (Signed)
Patient has been sleeping and when this writer went to get patient for morning medication, she didn't respond to voice. This Clinical research associate will attempt again when patient wakes up. NP was notified during progression rounds.
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
## 2023-04-29 NOTE — Group Note (Signed)
Date:  04/29/2023 Time:  9:20 PM  Group Topic/Focus:  Wrap-Up Group:   The focus of this group is to help patients review their daily goal of treatment and discuss progress on daily workbooks.    Participation Level:  Did Not Attend   Additional Comments:  Pt only comes to snack time and med pass  Maglione,Crystal Black E 04/29/2023, 9:20 PM

## 2023-04-29 NOTE — Progress Notes (Signed)
Patient's doses of Depakote and Risperdal have been moved, due to her waking up/receiving medications late. NP was notified and advised this writer on when to change the administration times.

## 2023-04-30 DIAGNOSIS — F332 Major depressive disorder, recurrent severe without psychotic features: Secondary | ICD-10-CM | POA: Diagnosis not present
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
## 2023-04-30 NOTE — Group Note (Signed)
Date:  04/30/2023 Time:  4:16 PM  Group Topic/Focus:  OUTDOOR RECREATION STRUCTURED ACTIVITY    Participation Level:  Did Not Attend   Crystal Black 04/30/2023, 4:16 PM

## 2023-04-30 NOTE — Group Note (Signed)
Date:  04/30/2023 Time:  10:11 AM  Group Topic/Focus:  Conflict Resolution:   The focus of this group is to discuss the conflict resolution process and how it may be used upon discharge. Goals Group:   The focus of this group is to help patients establish daily goals to achieve during treatment and discuss how the patient can incorporate goal setting into their daily lives to aide in recovery. Healthy Communication:   The focus of this group is to discuss communication, barriers to communication, as well as healthy ways to communicate with others.    Participation Level:  Minimal  Participation Quality:  Appropriate  Affect:  Appropriate  Cognitive:  Appropriate  Insight: Appropriate  Engagement in Group:  Developing/Improving  Modes of Intervention:  Activity  Additional Comments:    Crystal Black 04/30/2023, 10:11 AM

## 2023-04-30 NOTE — Plan of Care (Signed)
°  Problem: Education: °Goal: Knowledge of [facility] Education information/materials will improve °Outcome: Not Progressing °Goal: Emotional status will improve °Outcome: Not Progressing °Goal: Mental status will improve °Outcome: Not Progressing °Goal: Verbalization of understanding the information provided will improve °Outcome: Not Progressing °  °

## 2023-04-30 NOTE — Progress Notes (Signed)
Requested that pt come to medication room for meds. No response from pt. PT is without injury or distress by visual assessment. Will continue to request pt come to medication room.

## 2023-04-30 NOTE — Plan of Care (Signed)
  Problem: Coping: °Goal: Ability to demonstrate self-control will improve °Outcome: Progressing °  °Problem: Health Behavior/Discharge Planning: °Goal: Compliance with treatment plan for underlying cause of condition will improve °Outcome: Progressing °  °

## 2023-04-30 NOTE — Progress Notes (Signed)
  Chaplain On-Call responded to a page from [employer], who reported that the patient's Mother has died and the patient could use support.  Chaplain arrived at the Unit at 1205 hours.  Staff reported that the patient is having lunch now and would be in Group Session following that. Chaplain was advised to return at 1500 hours to visit with the patient.  Chaplain Evelena Peat M.Div., [facility]

## 2023-04-30 NOTE — Group Note (Signed)
Date:  04/30/2023 Time:  9:08 PM  Group Topic/Focus:  Wrap-Up Group:   The focus of this group is to help patients review their daily goal of treatment and discuss progress on daily workbooks.    Participation Level:  Did Not Attend   Katina Dung 04/30/2023, 9:08 PM

## 2023-04-30 NOTE — Progress Notes (Signed)
  Chaplain On-Call returned to the Unit after the patient was not available earlier in the day.  Chaplain received medical and psychological update from RN Johnny Bridge. While the patient has stated that she learned yesterday of her Mother's death, the Unit Staff have not been able to confirm this information.  Chaplain met Crystal Black and visited with her for twenty minutes. She described the difficulties at her current Group Home which are prompting her to move to a different one. She also reported that she feels left out of family connections and communications.  Chaplain provided spiritual and emotional support and prayer.  Chaplain Morene Crocker., [facility]

## 2023-04-30 NOTE — Group Note (Signed)
[facility] LCSW Group Therapy Note   Group Date: 04/30/2023 Start Time: 1550 End Time: 1635   Type of Therapy/Topic:  Group Therapy:  Emotion Regulation  Participation Level:  Did Not Attend   Mood:  Description of Group:    The purpose of this group is to assist patients in learning to regulate negative emotions and experience positive emotions. Patients will be guided to discuss ways in which they have been vulnerable to their negative emotions. These vulnerabilities will be juxtaposed with experiences of positive emotions or situations, and patients challenged to use positive emotions to combat negative ones. Special emphasis will be placed on coping with negative emotions in conflict situations, and patients will process healthy conflict resolution skills.  Therapeutic Goals: Patient will identify two positive emotions or experiences to reflect on in order to balance out negative emotions:  Patient will label two or more emotions that they find the most difficult to experience:  Patient will be able to demonstrate positive conflict resolution skills through discussion or role plays:   Summary of Patient Progress: The patient did not attend group.          Marshell Levan, LCSW
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
## 2023-04-30 NOTE — Progress Notes (Signed)
Nursing Shift Note:  1900-0700  Attended Evening Group: no Medication Compliant:  yes Behavior: isolative, childlike, only comes out for snacks and medications Sleep Quality: good Significant Changes: none noted    04/30/23 0100  Psych Admission Type (Psych Patients Only)  Admission Status Voluntary  Psychosocial Assessment  Patient Complaints None  Eye Contact Fair  Facial Expression Flat  Affect Appropriate to circumstance  Speech Logical/coherent  Interaction Childlike  Motor Activity Slow  Appearance/Hygiene In scrubs  Behavior Characteristics Cooperative  Mood Pleasant  Thought Process  Coherency Concrete thinking  Content WDL  Delusions None reported or observed  Perception WDL  Hallucination None reported or observed  Judgment WDL  Confusion None  Danger to Self  Current suicidal ideation? Denies  Danger to Others  Danger to Others None reported or observed

## 2023-05-01 DIAGNOSIS — F332 Major depressive disorder, recurrent severe without psychotic features: Secondary | ICD-10-CM | POA: Diagnosis not present

## 2023-05-01 LAB — URINALYSIS, COMPLETE (UACMP) WITH MICROSCOPIC
Bilirubin Urine: NEGATIVE
Glucose, UA: >=500 mg/dL — AB
Hgb urine dipstick: NEGATIVE
Ketones, ur: NEGATIVE mg/dL
Nitrite: NEGATIVE
Protein, ur: NEGATIVE mg/dL
Specific Gravity, Urine: 1.016 (ref 1.005–1.030)
pH: 7 (ref 5.0–8.0)

## 2023-05-01 MED ORDER — FLUOXETINE HCL 20 MG PO CAPS
40.0000 mg | ORAL_CAPSULE | Freq: Every day | ORAL | Status: DC
  Administered 2023-05-02 – 2023-05-03 (×2): 40 mg via ORAL
  Filled 2023-05-01 (×2): qty 2

## 2023-05-01 NOTE — Progress Notes (Signed)
Patient just came up to nurses station asking for anxiety medication, stating that another patient said something to her about the gospel song she was listening to. Patient given PRN Vistaril to help with relief. Patient tolerated medication administration well, without any issues. Patient remains safe on the unit and will continue to be monitored.   05/01/23 1826  Complaints & Interventions  Complains of Anxiety  Interventions Medication (see MAR)  Neuro symptoms relieved by Anti-anxiety medication
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
## 2023-05-01 NOTE — Progress Notes (Signed)
NP was notified of patient receiving IM Benadryl for possible exposure to peanut butter, by another patient on the unit. Patient was also given a specimen cup to provide a urine sample. Patient stated that she feels as if she has a UTI. Patient was informed to bring the sample up to the nurses station. This information will be passed along to the oncoming shift.

## 2023-05-01 NOTE — Progress Notes (Signed)
One of patient's scheduled HIV medications is not available from Pharmacy at scheduled time. Waiting for delivery from main pharmacy in order to administer. This Clinical research associate moved patient's scheduled time to 1200 to allow for medication arrival. NP was notified during progression rounds.

## 2023-05-01 NOTE — Plan of Care (Signed)
  Problem: Education: Goal: Knowledge of [facility] Education information/materials will improve Outcome: Progressing Goal: Emotional status will improve Outcome: Progressing Goal: Mental status will improve Outcome: Progressing Goal: Verbalization of understanding the information provided will improve Outcome: Progressing   Problem: Activity: Goal: Interest or engagement in activities will improve Outcome: Progressing Goal: Sleeping patterns will improve Outcome: Progressing   Problem: Coping: Goal: Ability to verbalize frustrations and anger appropriately will improve Outcome: Progressing Goal: Ability to demonstrate self-control will improve Outcome: Progressing   Problem: Health Behavior/Discharge Planning: Goal: Identification of resources available to assist in meeting health care needs will improve Outcome: Progressing Goal: Compliance with treatment plan for underlying cause of condition will improve Outcome: Progressing   Problem: Physical Regulation: Goal: Ability to maintain clinical measurements within normal limits will improve Outcome: Progressing   Problem: Safety: Goal: Periods of time without injury will increase Outcome: Progressing   Problem: Education: Goal: Knowledge of [facility] Education information/materials will improve Outcome: Progressing Goal: Emotional status will improve Outcome: Progressing Goal: Mental status will improve Outcome: Progressing Goal: Verbalization of understanding the information provided will improve Outcome: Progressing   Problem: Activity: Goal: Interest or engagement in activities will improve Outcome: Progressing Goal: Sleeping patterns will improve Outcome: Progressing   Problem: Coping: Goal: Ability to verbalize frustrations and anger appropriately will improve Outcome: Progressing Goal: Ability to demonstrate self-control will improve Outcome: Progressing   Problem: Health  Behavior/Discharge Planning: Goal: Identification of resources available to assist in meeting health care needs will improve Outcome: Progressing Goal: Compliance with treatment plan for underlying cause of condition will improve Outcome: Progressing   Problem: Physical Regulation: Goal: Ability to maintain clinical measurements within normal limits will improve Outcome: Progressing   Problem: Safety: Goal: Periods of time without injury will increase Outcome: Progressing   Problem: Education: Goal: Ability to make informed decisions regarding treatment will improve Outcome: Progressing   Problem: Coping: Goal: Coping ability will improve Outcome: Progressing   Problem: Health Behavior/Discharge Planning: Goal: Identification of resources available to assist in meeting health care needs will improve Outcome: Progressing   Problem: Medication: Goal: Compliance with prescribed medication regimen will improve Outcome: Progressing   Problem: Self-Concept: Goal: Ability to disclose and discuss suicidal ideas will improve Outcome: Progressing Goal: Will verbalize positive feelings about self Outcome: Progressing Note: Patient is on track. Patient will maintain adherence    Problem: Activity: Goal: Interest or engagement in leisure activities will improve Outcome: Progressing   Problem: Coping: Goal: Coping ability will improve Outcome: Progressing   Problem: Role Relationship: Goal: Will demonstrate positive changes in social behaviors and relationships Outcome: Progressing
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
## 2023-05-01 NOTE — Plan of Care (Signed)
  Problem: Education: Goal: Knowledge of [facility] Education information/materials will improve Outcome: Progressing Goal: Emotional status will improve Outcome: Progressing Goal: Mental status will improve Outcome: Progressing Goal: Verbalization of understanding the information provided will improve Outcome: Progressing   Problem: Activity: Goal: Interest or engagement in activities will improve Outcome: Progressing Goal: Sleeping patterns will improve Outcome: Progressing   Problem: Coping: Goal: Ability to verbalize frustrations and anger appropriately will improve Outcome: Progressing Goal: Ability to demonstrate self-control will improve Outcome: Progressing   Problem: Health Behavior/Discharge Planning: Goal: Identification of resources available to assist in meeting health care needs will improve Outcome: Progressing Goal: Compliance with treatment plan for underlying cause of condition will improve Outcome: Progressing   Problem: Physical Regulation: Goal: Ability to maintain clinical measurements within normal limits will improve Outcome: Progressing   Problem: Safety: Goal: Periods of time without injury will increase Outcome: Progressing   Problem: Education: Goal: Knowledge of [facility] Education information/materials will improve Outcome: Progressing Goal: Emotional status will improve Outcome: Progressing Goal: Mental status will improve Outcome: Progressing Goal: Verbalization of understanding the information provided will improve Outcome: Progressing   Problem: Activity: Goal: Interest or engagement in activities will improve Outcome: Progressing Goal: Sleeping patterns will improve Outcome: Progressing   Problem: Coping: Goal: Ability to verbalize frustrations and anger appropriately will improve Outcome: Progressing Goal: Ability to demonstrate self-control will improve Outcome: Progressing   Problem: Health  Behavior/Discharge Planning: Goal: Identification of resources available to assist in meeting health care needs will improve Outcome: Progressing Goal: Compliance with treatment plan for underlying cause of condition will improve Outcome: Progressing   Problem: Physical Regulation: Goal: Ability to maintain clinical measurements within normal limits will improve Outcome: Progressing   Problem: Safety: Goal: Periods of time without injury will increase Outcome: Progressing   Problem: Education: Goal: Ability to make informed decisions regarding treatment will improve Outcome: Progressing   Problem: Coping: Goal: Coping ability will improve Outcome: Progressing   Problem: Health Behavior/Discharge Planning: Goal: Identification of resources available to assist in meeting health care needs will improve Outcome: Progressing   Problem: Medication: Goal: Compliance with prescribed medication regimen will improve Outcome: Progressing   Problem: Self-Concept: Goal: Ability to disclose and discuss suicidal ideas will improve Outcome: Progressing   Problem: Activity: Goal: Interest or engagement in leisure activities will improve Outcome: Progressing   Problem: Coping: Goal: Coping ability will improve Outcome: Progressing   Problem: Role Relationship: Goal: Will demonstrate positive changes in social behaviors and relationships Outcome: Progressing

## 2023-05-01 NOTE — Progress Notes (Signed)
   05/01/23 1840  Complaints & Interventions  Complains of Itching;Other (Comment) (allergic reaction to peanuts, per patient)  Interventions Medication (see MAR) (IM Benadryl)
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
## 2023-05-01 NOTE — Progress Notes (Signed)
D- Patient alert and oriented. Patient presented in a pleasant mood on assessment stating that she slept ok last night and had no complaints to voice to this Clinical research associate. Patient denied SI, HI, AVH, and pain at this time. Patient also denied any signs/symptoms of depression and anxiety, stating "not right now". When asked if she had any goals for today, patient stated "getting home, good God, I've been here too long".  A- Scheduled medications administered to patient, per MD orders. Support and encouragement provided. Routine safety checks conducted every 15 minutes. Patient informed to notify staff with problems or concerns.  R- No adverse drug reactions noted. Patient contracts for safety at this time. Patient compliant with medications and treatment plan. Patient receptive, calm, and cooperative. Patient interacts well with others on the unit. Patient remains safe at this time.   05/01/23 1500  Psych Admission Type (Psych Patients Only)  Admission Status Voluntary  Psychosocial Assessment  Patient Complaints None  Eye Contact Fair;Watchful  Facial Expression Flat  Affect Appropriate to circumstance  Speech Logical/coherent  Interaction Assertive;Childlike  Motor Activity Slow  Appearance/Hygiene In scrubs;Unremarkable  Behavior Characteristics Cooperative;Appropriate to situation  Mood Pleasant  Aggressive Behavior  Effect No apparent injury  Thought Process  Coherency Circumstantial  Content WDL  Delusions None reported or observed  Perception WDL  Hallucination None reported or observed  Judgment WDL  Confusion None  Danger to Self  Current suicidal ideation? Denies  Self-Injurious Behavior No self-injurious ideation or behavior indicators observed or expressed   Agreement Not to Harm Self Yes  Description of Agreement Verbal  Danger to Others  Danger to Others None reported or observed

## 2023-05-01 NOTE — Group Note (Signed)
Date:  05/01/2023 Time:  6:40 PM  Group Topic/Focus:  OUTDOOR RECREATION STRUCTURED ACTIVITY    Participation Level:  Active  Participation Quality:  Appropriate  Affect:  Appropriate  Cognitive:  Alert  Insight: Appropriate  Engagement in Group:  Developing/Improving and Engaged  Modes of Intervention:  Activity  Additional Comments:    Crystal Black 05/01/2023, 6:40 PM

## 2023-05-01 NOTE — Group Note (Signed)
Date:  05/01/2023 Time:  9:58 PM  Group Topic/Focus:  Overcoming Stress:   The focus of this group is to define stress and help patients assess their triggers.    Participation Level:  Did Not Attend  Participation Quality:   none  Affect:   none  Cognitive:   none  Insight: None  Engagement in Group:   none  Modes of Intervention:   none  Additional Comments:  none   Rowyn Spilde 05/01/2023, 9:58 PM

## 2023-05-01 NOTE — Progress Notes (Signed)
   04/30/23 2200  Psych Admission Type (Psych Patients Only)  Admission Status Voluntary  Psychosocial Assessment  Patient Complaints None  Eye Contact Fair  Facial Expression Flat  Affect Appropriate to circumstance  Speech Logical/coherent  Interaction Childlike  Motor Activity Slow  Appearance/Hygiene In scrubs  Behavior Characteristics Cooperative  Mood Pleasant  Thought Process  Coherency Circumstantial  Content WDL  Delusions None reported or observed  Perception WDL  Hallucination None reported or observed  Judgment WDL  Confusion None  Danger to Self  Current suicidal ideation? Denies  Agreement Not to Harm Self Yes  Description of Agreement Verbal

## 2023-05-02 DIAGNOSIS — F332 Major depressive disorder, recurrent severe without psychotic features: Secondary | ICD-10-CM | POA: Diagnosis not present

## 2023-05-02 NOTE — Plan of Care (Signed)
  Problem: Activity: Goal: Interest or engagement in activities will improve Outcome: Progressing   Problem: Health Behavior/Discharge Planning: Goal: Compliance with treatment plan for underlying cause of condition will improve Outcome: Progressing   Problem: Safety: Goal: Periods of time without injury will increase Outcome: Progressing   Problem: Education: Goal: Emotional status will improve Outcome: Progressing

## 2023-05-02 NOTE — Group Note (Signed)
Recreation Therapy Group Note   Group Topic:Health and Wellness  Group Date: 05/02/2023 Start Time: 1000 End Time: 1055 Facilitators: Rosina Lowenstein, LRT, CTRS Location: Courtyard  Group Description: [employer]. LRT and patients played games of basketball, drew with chalk, and played corn hole while outside in the courtyard while getting fresh air and sunlight. Music was being played in the background. LRT and peers conversed about different games they have played before, what they do in their free time and anything else that is on their minds. LRT encouraged pts to drink water after being outside, sweating and getting their heart rate up.  Goal Area(s) Addressed: Patient will build on frustration tolerance skills. Patients will partake in a competitive play game with peers. Patients will gain knowledge of new leisure interest/hobby.    Affect/Mood: N/A   Participation Level: Did not attend    Clinical Observations/Individualized Feedback: Serina did not attend group.   Plan: Continue to engage patient in RT group sessions 2-3x/week.   Rosina Lowenstein, LRT, CTRS 05/02/2023 11:42 AM

## 2023-05-02 NOTE — Group Note (Signed)
Date:  05/02/2023 Time:  9:51 PM  Group Topic/Focus:  Overcoming Stress:   The focus of this group is to define stress and help patients assess their triggers.    Participation Level:  Active  Participation Quality:  Appropriate and Attentive  Affect:  Appropriate  Cognitive:  Alert and Appropriate  Insight: Appropriate, Good, and Improving  Engagement in Group:  Developing/Improving and Engaged  Modes of Intervention:  Discussion, Education, Rapport Building, Socialization, and Support  Additional Comments:     Juniel Groene 05/02/2023, 9:51 PM

## 2023-05-02 NOTE — Plan of Care (Signed)
  Problem: Education: Goal: Verbalization of understanding the information provided will improve Outcome: Progressing   Problem: Activity: Goal: Sleeping patterns will improve Outcome: Progressing   

## 2023-05-02 NOTE — Progress Notes (Signed)
Pt refused AM vital signs. 

## 2023-05-02 NOTE — Progress Notes (Signed)
Pt calm and pleasant during assessment denying SI/HI/AVH. Pt observed by this Clinical research associate interacting appropriately with staff and peers on the unit. Pt compliant with medication administration per MD orders. Pt given education, support, and encouragement to be active in her treatment plan. Pt being monitored Q 15 minutes for safety per unit protocol, remains safe on the unit
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
## 2023-05-02 NOTE — Progress Notes (Signed)
Pt was extremely agitated early in shift wanting arm to be "wrapped" and became verbally aggressive with staff.  Pt was given gauze to cover arm which she soon removed.  No lacerations or abrasions evident on arm and pt stated that she did not need any bandages and that she would wash the arm.  Pt affect was labile early in shift but stabilized as the evening progressed.  Pt denies SI, HI, AVH.    05/02/23 0600  Psych Admission Type (Psych Patients Only)  Admission Status Voluntary  Psychosocial Assessment  Patient Complaints None  Eye Contact Darting  Facial Expression Animated  Affect Labile  Speech Logical/coherent  Interaction Childlike  Motor Activity Slow  Appearance/Hygiene In scrubs  Behavior Characteristics Cooperative  Mood Pleasant  Aggressive Behavior  Effect No apparent injury  Thought Process  Coherency WDL  Content WDL  Delusions None reported or observed  Perception WDL  Hallucination None reported or observed  Judgment Poor  Confusion Mild  Danger to Self  Current suicidal ideation? Denies  Self-Injurious Behavior No self-injurious ideation or behavior indicators observed or expressed   Danger to Others  Danger to Others None reported or observed

## 2023-05-02 NOTE — Inpatient Diabetes Management (Signed)
Inpatient Diabetes Program Recommendations  AACE/ADA: New Consensus Statement on Inpatient Glycemic Control (2015)  Target Ranges:  Prepandial:   less than 140 mg/dL      Peak postprandial:   less than 180 mg/dL (1-2 hours)      Critically ill patients:  140 - 180 mg/dL   Lab Results  Component Value Date   GLUCAP 119 (H) 05/31/2022   HGBA1C 5.1 12/02/2020    Review of Glycemic Control  Diabetes history: None Outpatient Diabetes medications: antipsychotics Current orders for Inpatient glycemic control:  None  Labs: urine + glucose >500, negative ketones no other values available for review.  Inpatient Diabetes Program Recommendations:    -   Obtain CBC and A1c levels -   Order CBGs tid + hs -   order Novolog 0-15 units tid + hs to determine insulin needs and response to glucose trends   Thanks, Christena Deem RN, MSN, BC-ADM Inpatient Diabetes Coordinator Team Pager [phone] (8a-5p)

## 2023-05-02 NOTE — Group Note (Signed)
Date:  05/02/2023 Time:  11:20 AM  Group Topic/Focus:  Coping With Mental Health Crisis:   The purpose of this group is to help patients identify strategies for coping with mental health crisis.  Group discusses possible causes of crisis and ways to manage them effectively. Goals Group:   The focus of this group is to help patients establish daily goals to achieve during treatment and discuss how the patient can incorporate goal setting into their daily lives to aide in recovery. Rediscovering Joy:   The focus of this group is to explore various ways to relieve stress in a positive manner.    Participation Level:  Did Not Attend   Crystal Black 05/02/2023, 11:20 AM

## 2023-05-03 DIAGNOSIS — F332 Major depressive disorder, recurrent severe without psychotic features: Secondary | ICD-10-CM | POA: Diagnosis not present

## 2023-05-03 MED ORDER — DIVALPROEX SODIUM 500 MG PO DR TAB: 500.0000 mg | DELAYED_RELEASE_TABLET | Freq: Three times a day (TID) | ORAL | 0 refills | Status: DC

## 2023-05-03 MED ORDER — RISPERIDONE 0.5 MG PO TABS: 0.5000 mg | ORAL_TABLET | ORAL | 0 refills | Status: DC

## 2023-05-03 NOTE — Group Note (Signed)
Date:  05/03/2023 Time:  10:27 AM  Group Topic/Focus:  Activity Group:  The focus of the group is to encourage patients to come outside to the courtyard to get some fresh air and exercise for the benefit of their mental health,    Participation Level:  Active  Participation Quality:  Appropriate  Affect:  Appropriate  Cognitive:  Appropriate  Insight: Appropriate  Engagement in Group:  Engaged  Modes of Intervention:  Activity  Additional Comments:    Mary Sella Desirre Eickhoff 05/03/2023, 10:27 AM
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
## 2023-05-03 NOTE — [facility] Suicide Risk Assessment (Signed)
[facility] Discharge Suicide Risk Assessment   Principal Problem: Severe episode of recurrent major depressive disorder, without psychotic features (HCC) Discharge Diagnoses: Principal Problem:   Severe episode of recurrent major depressive disorder, without psychotic features (HCC) Active Problems:   Borderline personality disorder (HCC)   Suicidal ideations   Total Time spent with patient: 45 minutes  Musculoskeletal: Strength & Muscle Tone: within normal limits Gait & Station: normal Patient leans: N/A  Psychiatric Specialty Exam: Physical Exam Vitals and nursing note reviewed.  Constitutional:      Appearance: Normal appearance.  HENT:     Head: Normocephalic.     Nose: Nose normal.  Pulmonary:     Effort: Pulmonary effort is normal.  Musculoskeletal:        General: Normal range of motion.     Cervical back: Normal range of motion.  Neurological:     General: No focal deficit present.     Mental Status: She is alert and oriented to person, place, and time.     Review of Systems  All other systems reviewed and are negative.   Blood pressure (!) 97/58, pulse 77, temperature 99 F (37.2 C), temperature source Oral, resp. rate 17, height 5\' 2"  (1.575 m), weight 84.4 kg, SpO2 97%.Body mass index is 34.02 kg/m.  General Appearance: Casual  Eye Contact:  Good  Speech:  Normal Rate  Volume:  Normal  Mood:  Euthymic  Affect:  Appropriate  Thought Process:  Coherent and Descriptions of Associations: Intact  Orientation:  Full (Time, Place, and Person)  Thought Content:  WDL and Logical  Suicidal Thoughts:  No  Homicidal Thoughts:  No  Memory:  Immediate;   Good Recent;   Good Remote;   Good  Judgement:  Good  Insight:  Good  Psychomotor Activity:  Normal  Concentration:  Concentration: Good and Attention Span: Good  Recall:  Good  Fund of Knowledge:  Fair  Language:  Good  Akathisia:  No  Handed:  Right  AIMS (if indicated):     Assets:  Housing Leisure  Time Physical Health Resilience Social Support  ADL's:  Intact  Cognition:  Impaired,  Mild  Sleep:        Physical Exam: Physical Exam Vitals and nursing note reviewed.  Constitutional:      Appearance: Normal appearance.  HENT:     Head: Normocephalic.     Nose: Nose normal.  Pulmonary:     Effort: Pulmonary effort is normal.  Musculoskeletal:        General: Normal range of motion.     Cervical back: Normal range of motion.  Neurological:     General: No focal deficit present.     Mental Status: She is alert and oriented to person, place, and time.    Review of Systems  All other systems reviewed and are negative.  Blood pressure (!) 97/58, pulse 77, temperature 99 F (37.2 C), temperature source Oral, resp. rate 17, height 5\' 2"  (1.575 m), weight 84.4 kg, SpO2 97%. Body mass index is 34.02 kg/m.  Mental Status Per Nursing Assessment::   On Admission:  Suicidal ideation indicated by patient  Demographic Factors:  Adolescent or young adult and Caucasian  Loss Factors: NA  Historical Factors: NA  Risk Reduction Factors:   Living with another person, especially a relative, Positive social support, Positive therapeutic relationship, and Positive coping skills or problem solving skills  Continued Clinical Symptoms:  None  Cognitive Features That Contribute To Risk:  None  Suicide Risk:  Minimal: No identifiable suicidal ideation.  Patients presenting with no risk factors but with morbid ruminations; may be classified as minimal risk based on the severity of the depressive symptoms   Follow-up Information     [facility] Follow up.   Why: Appointment is scheduled for 05/05/2023 at [facility] information: [address] [place] [address] [phone]                 Plan Of Care/Follow-up recommendations:  Activity:  as tolerated Diet:  heart healthy diet Major depressive disorder, recurrent, severe without  psychosis: Prozac 40 mg daily Depakote 500 mg TID Risperdal 0.5 mg BID   Anxiety: Hydroxyzine 25 mg TID PRN   Insomnia: Trazodone 50 mg daily at bedtime PRN    Nanine Means, NP 05/03/2023, 7:48 AM

## 2023-05-03 NOTE — Plan of Care (Signed)
Pt denies SI/HI/AVH, scheduled to D/C on 11/6  Problem: Education: Goal: Knowledge of [facility] Education information/materials will improve Outcome: Progressing Goal: Emotional status will improve Outcome: Progressing Goal: Mental status will improve Outcome: Progressing Goal: Verbalization of understanding the information provided will improve Outcome: Progressing   Problem: Activity: Goal: Interest or engagement in activities will improve Outcome: Progressing Goal: Sleeping patterns will improve Outcome: Progressing   Problem: Coping: Goal: Ability to verbalize frustrations and anger appropriately will improve Outcome: Progressing Goal: Ability to demonstrate self-control will improve Outcome: Progressing   Problem: Health Behavior/Discharge Planning: Goal: Identification of resources available to assist in meeting health care needs will improve Outcome: Progressing Goal: Compliance with treatment plan for underlying cause of condition will improve Outcome: Progressing   Problem: Physical Regulation: Goal: Ability to maintain clinical measurements within normal limits will improve Outcome: Progressing   Problem: Safety: Goal: Periods of time without injury will increase Outcome: Progressing   Problem: Education: Goal: Knowledge of [facility] Education information/materials will improve Outcome: Progressing Goal: Emotional status will improve Outcome: Progressing Goal: Mental status will improve Outcome: Progressing Goal: Verbalization of understanding the information provided will improve Outcome: Progressing   Problem: Activity: Goal: Interest or engagement in activities will improve Outcome: Progressing Goal: Sleeping patterns will improve Outcome: Progressing   Problem: Coping: Goal: Ability to verbalize frustrations and anger appropriately will improve Outcome: Progressing Goal: Ability to demonstrate self-control will  improve Outcome: Progressing   Problem: Health Behavior/Discharge Planning: Goal: Identification of resources available to assist in meeting health care needs will improve Outcome: Progressing Goal: Compliance with treatment plan for underlying cause of condition will improve Outcome: Progressing   Problem: Physical Regulation: Goal: Ability to maintain clinical measurements within normal limits will improve Outcome: Progressing   Problem: Safety: Goal: Periods of time without injury will increase Outcome: Progressing   Problem: Education: Goal: Ability to make informed decisions regarding treatment will improve Outcome: Progressing   Problem: Coping: Goal: Coping ability will improve Outcome: Progressing   Problem: Health Behavior/Discharge Planning: Goal: Identification of resources available to assist in meeting health care needs will improve Outcome: Progressing   Problem: Medication: Goal: Compliance with prescribed medication regimen will improve Outcome: Progressing   Problem: Self-Concept: Goal: Ability to disclose and discuss suicidal ideas will improve Outcome: Progressing Goal: Will verbalize positive feelings about self Outcome: Progressing Note: Patient is on track. Patient will maintain adherence    Problem: Activity: Goal: Interest or engagement in leisure activities will improve Outcome: Progressing   Problem: Coping: Goal: Coping ability will improve Outcome: Progressing   Problem: Role Relationship: Goal: Will demonstrate positive changes in social behaviors and relationships Outcome: Progressing

## 2023-05-03 NOTE — Group Note (Signed)
Recreation Therapy Group Note   Group Topic:Goal Setting  Group Date: 05/03/2023 Start Time: 1015 End Time: 1115 Facilitators: Rosina Lowenstein, LRT, CTRS Location:  Dayroom  Group Description: Vision Boards. Patients were given many different magazines, a glue stick, markers, and a piece of cardstock paper. LRT and pts discussed the importance of having goals in life. LRT and pts discussed the difference between short-term and long-term goals, as well as what a SMART goal is. LRT encouraged pts to create a vision board, with images they picked and then cut out with safety scissors from the magazine, for themselves, that capture their short and long-term goals. LRT encouraged pts to show and explain their vision board to the group.   Goal Area(s) Addressed:  Patient will gain knowledge of short vs. long term goals.  Patient will identify goals for themselves. Patient will practice setting SMART goals. Patient will verbalize their goals to LRT and peers.   Affect/Mood: N/A   Participation Level: Did not attend    Clinical Observations/Individualized Feedback: Candee did not attend group.   Plan: Continue to engage patient in RT group sessions 2-3x/week.   Rosina Lowenstein, LRT, CTRS 05/03/2023 11:28 AM

## 2023-05-03 NOTE — Progress Notes (Signed)
  [facility] Adult Case Management Discharge Plan :  Will you be returning to the same living situation after discharge:  No. Patient going to a new group home. At discharge, do you have transportation home?: Yes,  Group home to provide transportation. Do you have the ability to pay for your medications: Yes,  [employer] / [employer]  Release of information consent forms completed and in the chart;  Patient's signature needed at discharge.  Patient to Follow up at:  Follow-up Information     [facility] Follow up.   Why: Appointment is scheduled for 05/05/2023 at 11AM Contact information: [address] [place] [address] [phone]                 Next level of care provider has access to [facility] Link:no  Safety Planning and Suicide Prevention discussed: Yes,  SPE completed with patient and patient's guardian.     Has patient been referred to the Quitline?: Patient refused referral for treatment  Patient has been referred for addiction treatment: No known substance use disorder.  Harden Mo, LCSW 05/03/2023, 11:01 AM

## 2023-05-03 NOTE — Progress Notes (Signed)
Patient is discharging at this time. Patient is A&Ox4. Stable. Patient denies SI,HI, and A/V/H with no plan/intent. Printed AVS reviewed with and given to patient along with medications and follow up appointments. Suicide safety plan complete with copy provided to patient. Original form in chart. Patient verbalized all understanding. All valuables/belongings returned to patient. Patient is being transported by group home transportation . Patient denies any pain/discomfort. No s/s of current distress.

## 2023-05-04 ENCOUNTER — Other Ambulatory Visit
Admission: RE | Admit: 2023-05-04 | Discharge: 2023-05-04 | Disposition: A | Discharge status: Home / Self Care | Payer: MEDICAID | Source: Ambulatory Visit | Attending: Infectious Diseases | Admitting: Infectious Diseases

## 2023-05-04 ENCOUNTER — Encounter: Payer: Self-pay | Admitting: Infectious Diseases

## 2023-05-04 ENCOUNTER — Ambulatory Visit: Payer: MEDICAID | Attending: Infectious Diseases | Admitting: Infectious Diseases

## 2023-05-04 VITALS — BP 103/70 | HR 97 | Temp 96.4°F | Ht 64.0 in | Wt 196.0 lb

## 2023-05-04 DIAGNOSIS — K219 Gastro-esophageal reflux disease without esophagitis: Secondary | ICD-10-CM | POA: Diagnosis not present

## 2023-05-04 DIAGNOSIS — F419 Anxiety disorder, unspecified: Secondary | ICD-10-CM | POA: Insufficient documentation

## 2023-05-04 DIAGNOSIS — F259 Schizoaffective disorder, unspecified: Secondary | ICD-10-CM | POA: Insufficient documentation

## 2023-05-04 DIAGNOSIS — F4310 Post-traumatic stress disorder, unspecified: Secondary | ICD-10-CM | POA: Diagnosis not present

## 2023-05-04 DIAGNOSIS — F603 Borderline personality disorder: Secondary | ICD-10-CM | POA: Diagnosis not present

## 2023-05-04 DIAGNOSIS — Z21 Asymptomatic human immunodeficiency virus [HIV] infection status: Secondary | ICD-10-CM | POA: Insufficient documentation

## 2023-05-04 DIAGNOSIS — B20 Human immunodeficiency virus [HIV] disease: Secondary | ICD-10-CM | POA: Insufficient documentation

## 2023-05-04 DIAGNOSIS — J45909 Unspecified asthma, uncomplicated: Secondary | ICD-10-CM | POA: Insufficient documentation

## 2023-05-04 DIAGNOSIS — Z79899 Other long term (current) drug therapy: Secondary | ICD-10-CM | POA: Diagnosis not present

## 2023-05-04 DIAGNOSIS — F17290 Nicotine dependence, other tobacco product, uncomplicated: Secondary | ICD-10-CM | POA: Insufficient documentation

## 2023-05-04 DIAGNOSIS — Z9142 Personal history of forced labor or sexual exploitation: Secondary | ICD-10-CM | POA: Insufficient documentation

## 2023-05-04 DIAGNOSIS — I10 Essential (primary) hypertension: Secondary | ICD-10-CM | POA: Diagnosis not present

## 2023-05-04 LAB — CBC WITH DIFFERENTIAL/PLATELET
Abs Immature Granulocytes: 0.05 10*3/uL (ref 0.00–0.07)
Basophils Absolute: 0 10*3/uL (ref 0.0–0.1)
Basophils Relative: 1 %
Eosinophils Absolute: 0.1 10*3/uL (ref 0.0–0.5)
Eosinophils Relative: 1 %
HCT: 37.6 % (ref 36.0–46.0)
Hemoglobin: 12.7 g/dL (ref 12.0–15.0)
Immature Granulocytes: 1 %
Lymphocytes Relative: 46 %
Lymphs Abs: 2.6 10*3/uL (ref 0.7–4.0)
MCH: 32.6 pg (ref 26.0–34.0)
MCHC: 33.8 g/dL (ref 30.0–36.0)
MCV: 96.7 fL (ref 80.0–100.0)
Monocytes Absolute: 0.5 10*3/uL (ref 0.1–1.0)
Monocytes Relative: 9 %
Neutro Abs: 2.3 10*3/uL (ref 1.7–7.7)
Neutrophils Relative %: 42 %
Platelets: 238 10*3/uL (ref 150–400)
RBC: 3.89 MIL/uL (ref 3.87–5.11)
RDW: 13.6 % (ref 11.5–15.5)
WBC: 5.5 10*3/uL (ref 4.0–10.5)
nRBC: 0 % (ref 0.0–0.2)

## 2023-05-04 LAB — COMPREHENSIVE METABOLIC PANEL
ALT: 21 U/L (ref 0–44)
AST: 16 U/L (ref 15–41)
Albumin: 3.7 g/dL (ref 3.5–5.0)
Alkaline Phosphatase: 38 U/L (ref 38–126)
Anion gap: 8 (ref 5–15)
BUN: 22 mg/dL — ABNORMAL HIGH (ref 6–20)
CO2: 21 mmol/L — ABNORMAL LOW (ref 22–32)
Calcium: 8.7 mg/dL — ABNORMAL LOW (ref 8.9–10.3)
Chloride: 110 mmol/L (ref 98–111)
Creatinine, Ser: 0.89 mg/dL (ref 0.44–1.00)
GFR, Estimated: >60 mL/min (ref >60)
Glucose, Bld: 124 mg/dL — ABNORMAL HIGH (ref 70–99)
Potassium: 4.4 mmol/L (ref 3.5–5.1)
Sodium: 139 mmol/L (ref 135–145)
Total Bilirubin: <0.2 mg/dL (ref <1.2)
Total Protein: 7.2 g/dL (ref 6.5–8.1)

## 2023-05-04 NOTE — Patient Instructions (Signed)
You had a follow-up appointment today for HIV recent two-week stay in a behavioral health unit. You mentioned feeling 'great' since your discharge and reported no current issues or concerns. We reviewed your HIV management and mental health treatment during the visit.  YOUR PLAN:  -HIV: HIV is a virus that attacks the body's immune system. Your viral load was previously undetectable, and your CD4 count was around 1000. We will order labs to check your current viral load and CD4 count. Please continue taking your medications, dolutegravir and Prezcobix, as prescribed.  -MENTAL HEALTH: Your mental health appears stable following your recent discharge from the behavioral health unit. You are currently taking Depakote, Risperdal, Prozac, hydroxyzine as needed for anxiety, and trazodone as needed for sleep. Please continue with these medications as prescribed and stay engaged with mental health services as needed.  -GENERAL HEALTH MAINTENANCE: General health maintenance includes routine checks and lifestyle recommendations. Please stay hydrated to help with the upcoming blood draw.  INSTRUCTIONS:  Please follow up with the lab tests to check your current viral load and CD4 count. Continue taking all your prescribed medications and stay engaged with mental health services as needed.
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
## 2023-05-05 LAB — T-HELPER CELLS CD4/CD8 %
% CD 4 Pos. Lymph.: 47.8 % (ref 30.8–58.5)
Absolute CD 4 Helper: 1195 /uL (ref 359–1519)
Basophils Absolute: 0.1 10*3/uL (ref 0.0–0.2)
Basos: 1 %
CD3+CD4+ Cells/CD3+CD8+ Cells Bld: 1.63 (ref 0.92–3.72)
CD3+CD8+ Cells # Bld: 733 /uL (ref 109–897)
CD3+CD8+ Cells NFr Bld: 29.3 % (ref 12.0–35.5)
EOS (ABSOLUTE): 0 10*3/uL (ref 0.0–0.4)
Eos: 1 %
Hematocrit: 38.6 % (ref 34.0–46.6)
Hemoglobin: 12.8 g/dL (ref 11.1–15.9)
Immature Grans (Abs): 0 10*3/uL (ref 0.0–0.1)
Immature Granulocytes: 1 %
Lymphocytes Absolute: 2.5 10*3/uL (ref 0.7–3.1)
Lymphs: 45 %
MCH: 32.2 pg (ref 26.6–33.0)
MCHC: 33.2 g/dL (ref 31.5–35.7)
MCV: 97 fL (ref 79–97)
Monocytes Absolute: 0.5 10*3/uL (ref 0.1–0.9)
Monocytes: 9 %
Neutrophils Absolute: 2.3 10*3/uL (ref 1.4–7.0)
Neutrophils: 43 %
Platelets: 249 10*3/uL (ref 150–450)
RBC: 3.98 x10E6/uL (ref 3.77–5.28)
RDW: 13.9 % (ref 11.7–15.4)
WBC: 5.5 10*3/uL (ref 3.4–10.8)

## 2023-05-05 LAB — HIV-1 RNA QUANT-NO REFLEX-BLD
HIV 1 RNA Quant: <20 {copies}/mL
LOG10 HIV-1 RNA: UNDETERMINED {Log}

## 2023-05-05 LAB — RPR: RPR Ser Ql: NONREACTIVE

## 2023-05-18 ENCOUNTER — Ambulatory Visit: Payer: Medicaid Other | Admitting: Infectious Diseases

## 2023-06-22 DIAGNOSIS — Z21 Asymptomatic human immunodeficiency virus [HIV] infection status: Secondary | ICD-10-CM | POA: Insufficient documentation

## 2023-06-22 DIAGNOSIS — B372 Candidiasis of skin and nail: Secondary | ICD-10-CM | POA: Insufficient documentation

## 2023-06-22 DIAGNOSIS — R21 Rash and other nonspecific skin eruption: Secondary | ICD-10-CM | POA: Diagnosis present

## 2023-06-23 ENCOUNTER — Emergency Department
Admission: EM | Admit: 2023-06-23 | Discharge: 2023-06-23 | Disposition: A | Discharge status: Home / Self Care | Payer: MEDICAID | Attending: Emergency Medicine | Admitting: Emergency Medicine

## 2023-06-23 ENCOUNTER — Other Ambulatory Visit: Payer: Self-pay

## 2023-06-23 DIAGNOSIS — B372 Candidiasis of skin and nail: Secondary | ICD-10-CM

## 2023-06-23 MED ORDER — NYSTATIN-TRIAMCINOLONE 100000-0.1 UNIT/GM-% EX OINT: 1.0000 | TOPICAL_OINTMENT | Freq: Two times a day (BID) | CUTANEOUS | 0 refills | Status: DC

## 2023-06-23 MED ORDER — MUPIROCIN 2 % EX OINT: 1.0000 | TOPICAL_OINTMENT | Freq: Two times a day (BID) | CUTANEOUS | 0 refills | Status: DC

## 2023-06-23 NOTE — ED Notes (Signed)
Attempted to call Gari Crown, listed as pt's legal guardian in chart [phone]); reports that she has not been this pt's legal guardian for 5 yrs---individual removed from [facility] listing

## 2023-06-23 NOTE — ED Notes (Addendum)
Called [name] [phone] who is listed in chart as pt's legal guardian---no answer; called on call number [phone] Kipp Brood) and informed of pt's pending d/c

## 2023-06-23 NOTE — ED Notes (Signed)
Attempted to call [facility] group home again with no answer [phone]

## 2023-06-23 NOTE — ED Notes (Signed)
Pt discharged with group home staff.

## 2023-06-23 NOTE — ED Triage Notes (Signed)
Pt reports concern for rash to breast x 2 weeks. Sts she doesn't recall using any different soaps. Concerns for new pain. No blisters or vesicles noted on assessment. No fevers. Denies any other concerns.

## 2023-06-23 NOTE — ED Notes (Signed)
First nurse Misty Stanley informed pt sitting in subwait. Call bell within reach for pt.

## 2023-06-23 NOTE — Discharge Instructions (Signed)
Please apply a thin layer of both of the prescription medications to your rash (all affected areas).  Use both ointments twice daily until the rash has resolved, which will probably take about a week.  Follow up with your regular doctor at the next available appointment.

## 2023-06-23 NOTE — ED Notes (Signed)
Pt is unsure of her group home name & information---attempted to call listing in pt's FYI--"[facility]" [phone] and [facility] Adie caregiver [phone]; no answer at either number

## 2023-06-23 NOTE — ED Provider Notes (Signed)
[facility] Provider Note    Event Date/Time   First MD Initiated Contact with Patient 06/23/23 0559     (approximate)   History   Rash   HPI Crystal Black is a 27 y.o. female with a legal guardian who presents for evaluation of rash.  It has been gradually worsening over about 2 weeks.  She reports that it is between and underneath both breasts.  She is not pregnant or breast-feeding.  She does not remember using any different soaps or products and does not remember getting any bug bites.  She said that it is painful and that it has started to break open.  No recent fevers.  No trauma.  Medical history is notable for HIV and psychiatric issues including borderline personality disorder and PTSD.     Physical Exam   Triage Vital Signs: ED Triage Vitals  Encounter Vitals Group     BP 06/23/23 0005 116/71     Systolic BP Percentile --      Diastolic BP Percentile --      Pulse Rate 06/23/23 0005 93     Resp 06/23/23 0005 16     Temp 06/23/23 0005 98.7 F (37.1 C)     Temp Source 06/23/23 0005 Oral     SpO2 06/23/23 0005 97 %     Weight 06/23/23 0006 81.2 kg (179 lb)     Height 06/23/23 0006 1.651 m (5\' 5" )     Head Circumference --      Peak Flow --      Pain Score 06/23/23 0009 8     Pain Loc --      Pain Education --      Exclude from Growth Chart --     Most recent vital signs: Vitals:   06/23/23 0005 06/23/23 0505  BP: 116/71 111/64  Pulse: 93 (!) 101  Resp: 16 19  Temp: 98.7 F (37.1 C) 98.4 F (36.9 C)  SpO2: 97% 98%    General: Awake, no distress.  CV:  Good peripheral perfusion.  Resp:  Normal effort. Speaking easily and comfortably, no accessory muscle usage nor intercostal retractions.   Abd:  No distention.  Other:  Patient has intertriginous erythematous rash consistent with mild yeast infection with skin breakdown most notable on the medial aspect of the left breast.  The rash is confined to the intertriginous regions, both  between the breasts and beneath them.  No evidence of abscess or cellulitis   ED Results / Procedures / Treatments   Labs (all labs ordered are listed, but only abnormal results are displayed) Labs Reviewed - No data to display    PROCEDURES:  Critical Care performed: No  Procedures    IMPRESSION / MDM / ASSESSMENT AND PLAN / ED COURSE  I reviewed the triage vital signs and the nursing notes.                              Differential diagnosis includes, but is not limited to, dermatitis, cellulitis, candidal infection, herpes.  Patient's presentation is most consistent with acute, uncomplicated illness.    Intertriginous skin rash, no concern for severe bacterial infection, looks most consistent with candidal infection with some skin breakdown due to friction.  I prescribed triamcinolone-nystatin ointment along with mupirocin ointment and advised the patient to apply both ointments to the affected regions twice a day until the rash improves.  Also strongly encouraged  the next available follow-up appointment with her primary care provider.  She understands and agrees with the plan.  No indication for evaluation of systemic issue.         FINAL CLINICAL IMPRESSION(S) / ED DIAGNOSES   Final diagnoses:  Intertriginous candidiasis     Rx / DC Orders   ED Discharge Orders          Ordered    nystatin-triamcinolone ointment (MYCOLOG)  2 times daily        06/23/23 0603    mupirocin ointment (BACTROBAN) 2 %  2 times daily        06/23/23 0603             Note:  This document was prepared using Dragon voice recognition software and may include unintentional dictation errors.   Loleta Rose, MD 06/23/23 [phone]

## 2023-06-23 NOTE — ED Notes (Signed)
Attempted to call group home again with no answer

## 2023-07-04 ENCOUNTER — Encounter: Payer: Self-pay | Admitting: *Deleted

## 2023-07-04 ENCOUNTER — Other Ambulatory Visit: Payer: Self-pay

## 2023-07-04 ENCOUNTER — Emergency Department (EMERGENCY_DEPARTMENT_HOSPITAL)
Admission: EM | Admit: 2023-07-04 | Discharge: 2023-07-05 | Disposition: A | Discharge status: Other Acute Inpatient Hospital | Payer: MEDICAID | Source: Home / Self Care | Attending: Emergency Medicine | Admitting: Emergency Medicine

## 2023-07-04 DIAGNOSIS — Z21 Asymptomatic human immunodeficiency virus [HIV] infection status: Secondary | ICD-10-CM | POA: Insufficient documentation

## 2023-07-04 DIAGNOSIS — S50812A Abrasion of left forearm, initial encounter: Secondary | ICD-10-CM | POA: Insufficient documentation

## 2023-07-04 DIAGNOSIS — J45909 Unspecified asthma, uncomplicated: Secondary | ICD-10-CM | POA: Insufficient documentation

## 2023-07-04 DIAGNOSIS — X789XXA Intentional self-harm by unspecified sharp object, initial encounter: Secondary | ICD-10-CM | POA: Insufficient documentation

## 2023-07-04 DIAGNOSIS — F322 Major depressive disorder, single episode, severe without psychotic features: Secondary | ICD-10-CM | POA: Insufficient documentation

## 2023-07-04 DIAGNOSIS — R45851 Suicidal ideations: Secondary | ICD-10-CM | POA: Insufficient documentation

## 2023-07-04 DIAGNOSIS — F4310 Post-traumatic stress disorder, unspecified: Secondary | ICD-10-CM | POA: Diagnosis not present

## 2023-07-04 DIAGNOSIS — F603 Borderline personality disorder: Secondary | ICD-10-CM | POA: Diagnosis present

## 2023-07-04 DIAGNOSIS — F331 Major depressive disorder, recurrent, moderate: Secondary | ICD-10-CM | POA: Diagnosis present

## 2023-07-04 DIAGNOSIS — F1910 Other psychoactive substance abuse, uncomplicated: Secondary | ICD-10-CM | POA: Diagnosis present

## 2023-07-04 LAB — COMPREHENSIVE METABOLIC PANEL
ALT: 24 U/L (ref 0–44)
AST: 21 U/L (ref 15–41)
Albumin: 4 g/dL (ref 3.5–5.0)
Alkaline Phosphatase: 51 U/L (ref 38–126)
Anion gap: 12 (ref 5–15)
BUN: 18 mg/dL (ref 6–20)
CO2: 20 mmol/L — ABNORMAL LOW (ref 22–32)
Calcium: 8.9 mg/dL (ref 8.9–10.3)
Chloride: 107 mmol/L (ref 98–111)
Creatinine, Ser: 0.91 mg/dL (ref 0.44–1.00)
GFR, Estimated: >60 mL/min (ref >60)
Glucose, Bld: 158 mg/dL — ABNORMAL HIGH (ref 70–99)
Potassium: 3.8 mmol/L (ref 3.5–5.1)
Sodium: 139 mmol/L (ref 135–145)
Total Bilirubin: 0.4 mg/dL (ref 0.0–1.2)
Total Protein: 7.6 g/dL (ref 6.5–8.1)

## 2023-07-04 LAB — URINE DRUG SCREEN, QUALITATIVE (ARMC ONLY)
Amphetamines, Ur Screen: NOT DETECTED
Barbiturates, Ur Screen: NOT DETECTED
Benzodiazepine, Ur Scrn: NOT DETECTED
Cannabinoid 50 Ng, Ur ~~LOC~~: NOT DETECTED
Cocaine Metabolite,Ur ~~LOC~~: NOT DETECTED
MDMA (Ecstasy)Ur Screen: NOT DETECTED
Methadone Scn, Ur: NOT DETECTED
Opiate, Ur Screen: NOT DETECTED
Phencyclidine (PCP) Ur S: NOT DETECTED
Tricyclic, Ur Screen: NOT DETECTED

## 2023-07-04 LAB — ETHANOL: Alcohol, Ethyl (B): <10 mg/dL (ref <10)

## 2023-07-04 LAB — ACETAMINOPHEN LEVEL: Acetaminophen (Tylenol), Serum: <10 ug/mL — ABNORMAL LOW (ref 10–30)

## 2023-07-04 LAB — SALICYLATE LEVEL: Salicylate Lvl: <7 mg/dL — ABNORMAL LOW (ref 7.0–30.0)

## 2023-07-04 LAB — PREGNANCY, URINE: Preg Test, Ur: NEGATIVE

## 2023-07-04 NOTE — ED Notes (Signed)
Pt given PM snack at this time.  
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
## 2023-07-04 NOTE — ED Notes (Signed)
 VOL/Psych Consult ordered/Pending/Moved to [facility]

## 2023-07-04 NOTE — ED Notes (Signed)
 Black shoes Red pants Red jacket Gold shiny belt Blue panties Cigarettes Vape Cig lighter Red bra Hair tie Extra long hair piece
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
## 2023-07-04 NOTE — ED Triage Notes (Signed)
 Pt brought in by group home  pt states I want to kill myself.  Sx began today.  Pt used an earring today and scratched left forearm.  Pt denies drugs and etoh.  Pt reports hearing voices.  Pt cooperative.  Pt alert.

## 2023-07-04 NOTE — ED Notes (Signed)
 VOL
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
## 2023-07-04 NOTE — ED Notes (Signed)
 Pt. Alert and oriented, warm and dry, in no distress. Pt. Denies HI, and AH. Patient states she is having SI thoughts with the plan of taking a knife and slitting her throat, and is seeing shadows.  Patient contracts for safety with this clinical research associate. Pt. Encouraged to let nursing staff know of any concerns or needs.   ENVIRONMENTAL ASSESSMENT Potentially harmful objects out of patient reach: Yes.   Personal belongings secured: Yes.   Patient dressed in hospital provided attire only: Yes.   Plastic bags out of patient reach: Yes.   Patient care equipment (cords, cables, call bells, lines, and drains) shortened, removed, or accounted for: Yes.   Equipment and supplies removed from bottom of stretcher: Yes.   Potentially toxic materials out of patient reach: Yes.   Sharps container removed or out of patient reach: Yes.

## 2023-07-05 ENCOUNTER — Inpatient Hospital Stay
Admission: AD | Admit: 2023-07-05 | Discharge: 2023-07-11 | DRG: 885 | Disposition: A | Discharge status: Home / Self Care | Payer: MEDICAID | Source: Intra-hospital | Attending: Psychiatry | Admitting: Psychiatry

## 2023-07-05 ENCOUNTER — Encounter: Payer: Self-pay | Admitting: Psychiatry

## 2023-07-05 DIAGNOSIS — F3160 Bipolar disorder, current episode mixed, unspecified: Principal | ICD-10-CM | POA: Diagnosis present

## 2023-07-05 DIAGNOSIS — Z9152 Personal history of nonsuicidal self-harm: Secondary | ICD-10-CM

## 2023-07-05 DIAGNOSIS — F17290 Nicotine dependence, other tobacco product, uncomplicated: Secondary | ICD-10-CM | POA: Diagnosis present

## 2023-07-05 DIAGNOSIS — R1310 Dysphagia, unspecified: Secondary | ICD-10-CM | POA: Diagnosis present

## 2023-07-05 DIAGNOSIS — Z79899 Other long term (current) drug therapy: Secondary | ICD-10-CM | POA: Diagnosis not present

## 2023-07-05 DIAGNOSIS — X781XXA Intentional self-harm by knife, initial encounter: Secondary | ICD-10-CM | POA: Diagnosis present

## 2023-07-05 DIAGNOSIS — Z813 Family history of other psychoactive substance abuse and dependence: Secondary | ICD-10-CM

## 2023-07-05 DIAGNOSIS — R45851 Suicidal ideations: Secondary | ICD-10-CM | POA: Diagnosis not present

## 2023-07-05 DIAGNOSIS — J45909 Unspecified asthma, uncomplicated: Secondary | ICD-10-CM | POA: Diagnosis present

## 2023-07-05 DIAGNOSIS — E669 Obesity, unspecified: Secondary | ICD-10-CM | POA: Diagnosis present

## 2023-07-05 DIAGNOSIS — F329 Major depressive disorder, single episode, unspecified: Secondary | ICD-10-CM | POA: Insufficient documentation

## 2023-07-05 DIAGNOSIS — Z5320 Procedure and treatment not carried out because of patient's decision for unspecified reasons: Secondary | ICD-10-CM | POA: Diagnosis not present

## 2023-07-05 DIAGNOSIS — Z6834 Body mass index (BMI) 34.0-34.9, adult: Secondary | ICD-10-CM

## 2023-07-05 DIAGNOSIS — J029 Acute pharyngitis, unspecified: Secondary | ICD-10-CM | POA: Diagnosis present

## 2023-07-05 DIAGNOSIS — R112 Nausea with vomiting, unspecified: Secondary | ICD-10-CM | POA: Diagnosis present

## 2023-07-05 DIAGNOSIS — Z20822 Contact with and (suspected) exposure to covid-19: Secondary | ICD-10-CM | POA: Diagnosis not present

## 2023-07-05 DIAGNOSIS — S50812A Abrasion of left forearm, initial encounter: Secondary | ICD-10-CM | POA: Diagnosis present

## 2023-07-05 DIAGNOSIS — G4700 Insomnia, unspecified: Secondary | ICD-10-CM | POA: Diagnosis present

## 2023-07-05 DIAGNOSIS — F17210 Nicotine dependence, cigarettes, uncomplicated: Secondary | ICD-10-CM | POA: Diagnosis present

## 2023-07-05 DIAGNOSIS — K5909 Other constipation: Secondary | ICD-10-CM | POA: Diagnosis present

## 2023-07-05 DIAGNOSIS — F419 Anxiety disorder, unspecified: Secondary | ICD-10-CM | POA: Diagnosis present

## 2023-07-05 DIAGNOSIS — Z716 Tobacco abuse counseling: Secondary | ICD-10-CM

## 2023-07-05 DIAGNOSIS — F603 Borderline personality disorder: Secondary | ICD-10-CM | POA: Diagnosis not present

## 2023-07-05 DIAGNOSIS — F3164 Bipolar disorder, current episode mixed, severe, with psychotic features: Secondary | ICD-10-CM | POA: Diagnosis present

## 2023-07-05 DIAGNOSIS — B20 Human immunodeficiency virus [HIV] disease: Secondary | ICD-10-CM | POA: Diagnosis present

## 2023-07-05 DIAGNOSIS — X789XXA Intentional self-harm by unspecified sharp object, initial encounter: Secondary | ICD-10-CM | POA: Diagnosis present

## 2023-07-05 DIAGNOSIS — F333 Major depressive disorder, recurrent, severe with psychotic symptoms: Secondary | ICD-10-CM | POA: Diagnosis not present

## 2023-07-05 DIAGNOSIS — Z9151 Personal history of suicidal behavior: Secondary | ICD-10-CM | POA: Diagnosis not present

## 2023-07-05 DIAGNOSIS — F331 Major depressive disorder, recurrent, moderate: Secondary | ICD-10-CM | POA: Diagnosis not present

## 2023-07-05 DIAGNOSIS — F4310 Post-traumatic stress disorder, unspecified: Secondary | ICD-10-CM | POA: Diagnosis not present

## 2023-07-05 DIAGNOSIS — Z21 Asymptomatic human immunodeficiency virus [HIV] infection status: Secondary | ICD-10-CM | POA: Diagnosis not present

## 2023-07-05 DIAGNOSIS — F332 Major depressive disorder, recurrent severe without psychotic features: Secondary | ICD-10-CM | POA: Diagnosis present

## 2023-07-05 DIAGNOSIS — R1011 Right upper quadrant pain: Secondary | ICD-10-CM | POA: Diagnosis not present

## 2023-07-05 LAB — CBC
HCT: 37.8 % (ref 36.0–46.0)
Hemoglobin: 12.8 g/dL (ref 12.0–15.0)
MCH: 32.3 pg (ref 26.0–34.0)
MCHC: 33.9 g/dL (ref 30.0–36.0)
MCV: 95.5 fL (ref 80.0–100.0)
Platelets: 208 10*3/uL (ref 150–400)
RBC: 3.96 MIL/uL (ref 3.87–5.11)
RDW: 12.7 % (ref 11.5–15.5)
WBC: 6.4 10*3/uL (ref 4.0–10.5)
nRBC: 0 % (ref 0.0–0.2)

## 2023-07-05 MED ORDER — MAGNESIUM HYDROXIDE 400 MG/5ML PO SUSP: 30.0000 mL | Freq: Every day | ORAL | Status: DC | PRN

## 2023-07-05 MED ORDER — HALOPERIDOL LACTATE 5 MG/ML IJ SOLN: 5.0000 mg | Freq: Three times a day (TID) | INTRAMUSCULAR | Status: DC | PRN

## 2023-07-05 MED ORDER — BENZTROPINE MESYLATE 1 MG PO TABS
1.0000 mg | ORAL_TABLET | Freq: Two times a day (BID) | ORAL | Status: DC
Start: 2023-07-05 — End: 2023-07-11
  Administered 2023-07-05 – 2023-07-11 (×11): 1 mg via ORAL
  Filled 2023-07-05 (×11): qty 1

## 2023-07-05 MED ORDER — DIPHENHYDRAMINE HCL 25 MG PO CAPS: 50.0000 mg | ORAL_CAPSULE | Freq: Three times a day (TID) | ORAL | Status: DC | PRN

## 2023-07-05 MED ORDER — MELATONIN 5 MG PO TABS
5.0000 mg | ORAL_TABLET | Freq: Every day | ORAL | Status: DC
Start: 2023-07-05 — End: 2023-07-07
  Administered 2023-07-05 – 2023-07-06 (×2): 5 mg via ORAL
  Filled 2023-07-05 (×2): qty 1

## 2023-07-05 MED ORDER — DARUNAVIR-COBICISTAT 800-150 MG PO TABS
1.0000 | ORAL_TABLET | Freq: Every day | ORAL | Status: DC
  Administered 2023-07-06 – 2023-07-11 (×5): 1 via ORAL
  Filled 2023-07-05 (×6): qty 1

## 2023-07-05 MED ORDER — TOPIRAMATE 25 MG PO TABS
50.0000 mg | ORAL_TABLET | Freq: Two times a day (BID) | ORAL | Status: DC
  Administered 2023-07-05 – 2023-07-11 (×11): 50 mg via ORAL
  Filled 2023-07-05 (×11): qty 2

## 2023-07-05 MED ORDER — ENSURE ENLIVE PO LIQD
237.0000 mL | Freq: Two times a day (BID) | ORAL | Status: DC
  Administered 2023-07-06 – 2023-07-10 (×7): 237 mL via ORAL

## 2023-07-05 MED ORDER — HYDROXYZINE HCL 50 MG PO TABS
50.0000 mg | ORAL_TABLET | Freq: Three times a day (TID) | ORAL | Status: DC | PRN
  Administered 2023-07-06: 50 mg via ORAL
  Filled 2023-07-05: qty 1

## 2023-07-05 MED ORDER — ACETAMINOPHEN 325 MG PO TABS
650.0000 mg | ORAL_TABLET | Freq: Four times a day (QID) | ORAL | Status: DC | PRN
  Administered 2023-07-09: 650 mg via ORAL
  Filled 2023-07-05: qty 2

## 2023-07-05 MED ORDER — DIPHENHYDRAMINE HCL 50 MG/ML IJ SOLN: 50.0000 mg | Freq: Three times a day (TID) | INTRAMUSCULAR | Status: DC | PRN

## 2023-07-05 MED ORDER — DIVALPROEX SODIUM ER 500 MG PO TB24
500.0000 mg | ORAL_TABLET | Freq: Three times a day (TID) | ORAL | Status: DC
Start: 2023-07-05 — End: 2023-07-10
  Administered 2023-07-05 – 2023-07-10 (×13): 500 mg via ORAL
  Filled 2023-07-05 (×14): qty 1

## 2023-07-05 MED ORDER — IBUPROFEN 600 MG PO TABS: 800.0000 mg | ORAL_TABLET | Freq: Three times a day (TID) | ORAL | Status: DC | PRN

## 2023-07-05 MED ORDER — LORAZEPAM 2 MG/ML IJ SOLN: 2.0000 mg | Freq: Three times a day (TID) | INTRAMUSCULAR | Status: DC | PRN

## 2023-07-05 MED ORDER — HALOPERIDOL 5 MG PO TABS: 5.0000 mg | ORAL_TABLET | Freq: Three times a day (TID) | ORAL | Status: DC | PRN

## 2023-07-05 MED ORDER — RISPERIDONE 1 MG PO TABS
0.5000 mg | ORAL_TABLET | Freq: Two times a day (BID) | ORAL | Status: DC
Start: 2023-07-05 — End: 2023-07-06
  Administered 2023-07-05 – 2023-07-06 (×2): 0.5 mg via ORAL
  Filled 2023-07-05 (×2): qty 1

## 2023-07-05 MED ORDER — LINACLOTIDE 290 MCG PO CAPS
290.0000 ug | ORAL_CAPSULE | Freq: Every day | ORAL | Status: DC
Start: 2023-07-06 — End: 2023-07-06
  Filled 2023-07-05 (×2): qty 1

## 2023-07-05 MED ORDER — DOCUSATE SODIUM 100 MG PO CAPS
100.0000 mg | ORAL_CAPSULE | Freq: Every day | ORAL | Status: DC
Start: 2023-07-05 — End: 2023-07-11
  Administered 2023-07-05 – 2023-07-11 (×6): 100 mg via ORAL
  Filled 2023-07-05 (×7): qty 1

## 2023-07-05 MED ORDER — TRAZODONE HCL 50 MG PO TABS
50.0000 mg | ORAL_TABLET | Freq: Every evening | ORAL | Status: DC | PRN
  Administered 2023-07-06 – 2023-07-10 (×5): 50 mg via ORAL
  Filled 2023-07-05 (×5): qty 1

## 2023-07-05 MED ORDER — HYDROXYZINE HCL 25 MG PO TABS
25.0000 mg | ORAL_TABLET | Freq: Three times a day (TID) | ORAL | Status: DC | PRN
  Administered 2023-07-05: 25 mg via ORAL
  Filled 2023-07-05: qty 1

## 2023-07-05 MED ORDER — FAMOTIDINE 20 MG PO TABS
20.0000 mg | ORAL_TABLET | Freq: Every day | ORAL | Status: DC
  Administered 2023-07-06 – 2023-07-11 (×5): 20 mg via ORAL
  Filled 2023-07-05 (×5): qty 1

## 2023-07-05 MED ORDER — NICOTINE POLACRILEX 2 MG MT GUM
2.0000 mg | CHEWING_GUM | OROMUCOSAL | Status: DC | PRN
  Administered 2023-07-05 – 2023-07-10 (×14): 2 mg via ORAL
  Filled 2023-07-05 (×17): qty 1

## 2023-07-05 MED ORDER — HALOPERIDOL LACTATE 5 MG/ML IJ SOLN: 10.0000 mg | Freq: Three times a day (TID) | INTRAMUSCULAR | Status: DC | PRN

## 2023-07-05 MED ORDER — ALUM & MAG HYDROXIDE-SIMETH 200-200-20 MG/5ML PO SUSP
30.0000 mL | ORAL | Status: DC | PRN
  Administered 2023-07-10: 30 mL via ORAL
  Filled 2023-07-05: qty 30

## 2023-07-05 MED ORDER — FLUOXETINE HCL 20 MG PO CAPS
40.0000 mg | ORAL_CAPSULE | Freq: Every day | ORAL | Status: DC
  Administered 2023-07-05 – 2023-07-10 (×6): 40 mg via ORAL
  Filled 2023-07-05 (×6): qty 2

## 2023-07-05 MED ORDER — DOLUTEGRAVIR SODIUM 50 MG PO TABS
50.0000 mg | ORAL_TABLET | Freq: Every day | ORAL | Status: DC
Start: 2023-07-06 — End: 2023-07-11
  Administered 2023-07-06 – 2023-07-11 (×5): 50 mg via ORAL
  Filled 2023-07-05 (×6): qty 1

## 2023-07-05 NOTE — ED Notes (Signed)
 Vol/recommend inpatient psychiatric hospitalization when medically cleared.

## 2023-07-05 NOTE — Group Note (Signed)
 Date:  07/05/2023 Time:  11:35 AM  Group Topic/Focus:  Goals Group:   The focus of this group is to help patients establish daily goals to achieve during treatment and discuss how the patient can incorporate goal setting into their daily lives to aide in recovery. Personal Choices and Values:   The focus of this group is to help patients assess and explore the importance of values in their lives, how their values affect their decisions, how they express their values and what opposes their expression. Rediscovering Joy:   The focus of this group is to explore various ways to relieve stress in a positive manner.    Participation Level:  Did Not Attend   Lurlean Niece 07/05/2023, 11:35 AM

## 2023-07-05 NOTE — Plan of Care (Signed)
 New admission

## 2023-07-05 NOTE — ED Notes (Signed)
 Report given to Rise Mu, RN pt agreeable for inpatient tx, transported via wheelchair [facility] staff x2

## 2023-07-05 NOTE — BH Assessment (Signed)
 Patient is to be admitted to [facility] today 07/05/23 by   Cam .  Attending Physician will be Dr. Cam.   Patient has been assigned to room 307, by [facility] Charge Nurse Luke.    ER staff is aware of the admission: Olam, ER Secretary   Dr. Ernest, ER MD  Izetta, Patient's Nurse  Rob Patient Access.

## 2023-07-05 NOTE — Group Note (Signed)
 Date:  07/05/2023 Time:  11:10 PM  Group Topic/Focus:  Personal Choices and Values:   The focus of this group is to help patients assess and explore the importance of values in their lives, how their values affect their decisions, how they express their values and what opposes their expression.    Participation Level:  Active  Participation Quality:  Appropriate and Attentive  Affect:  Appropriate  Cognitive:  Alert and Appropriate  Insight: Appropriate, Good, and Improving  Engagement in Group:  Developing/Improving and Engaged  Modes of Intervention:  Activity, Clarification, Discussion, Education, Problem-solving, Rapport Building, Socialization, and Support  Additional Comments:     Delphina Schum 07/05/2023, 11:10 PM

## 2023-07-05 NOTE — Group Note (Signed)
 Date:  07/05/2023 Time:  5:44 PM  Group Topic/Focus:  Coping With Mental Health Crisis:   The purpose of this group is to help patients identify strategies for coping with mental health crisis.  Group discusses possible causes of crisis and ways to manage them effectively. Art Therapy video driven -Art therapy is an effective treatment for persons experiencing developmental, medical, educational, social or psychological impairment. A key goal in art therapy is to improve or restore the client's functioning and his/her sense of personal well being.    Participation Level:  Did Not Attend   Lurlean Niece 07/05/2023, 5:44 PM

## 2023-07-05 NOTE — BH Assessment (Signed)
 Attempted to contact the patient's legal guardian Crystal Black [phone] for verbal consent for treatment but no answer, a voicemail was left to return phone call.

## 2023-07-05 NOTE — Group Note (Signed)
 [facility] LCSW Group Therapy Note   Group Date: 07/05/2023 Start Time: 1300 End Time: 1440   Type of Therapy/Topic:  Group Therapy:  Emotion Regulation  Participation Level:  Active   Mood:  Description of Group:    The purpose of this group is to assist patients in learning to regulate negative emotions and experience positive emotions. Patients will be guided to discuss ways in which they have been vulnerable to their negative emotions. These vulnerabilities will be juxtaposed with experiences of positive emotions or situations, and patients challenged to use positive emotions to combat negative ones. Special emphasis will be placed on coping with negative emotions in conflict situations, and patients will process healthy conflict resolution skills.  Therapeutic Goals: Patient will identify two positive emotions or experiences to reflect on in order to balance out negative emotions:  Patient will label two or more emotions that they find the most difficult to experience:  Patient will be able to demonstrate positive conflict resolution skills through discussion or role plays:   Summary of Patient Progress: Patient was present for the entirety of the group process. She was involved in the discussion and shared personal insight into herself and the topic. Her comments and behavior were appropriate. Pt appeared open and receptive to feedback/comments from both her peers and facilitator.   Therapeutic Modalities:   Cognitive Behavioral Therapy Feelings Identification Dialectical Behavioral Therapy   Crystal JONELLE Fam, LCSW

## 2023-07-05 NOTE — ED Notes (Signed)
 Pt guardian Contact info as follows: [employer] [address] [address]  [place], [place] [address] Office Phone: [phone] Office Fax:  [phone]  24 Hour Emergency Number: [phone]  Spoke to Guardian on call Drenda Gaskins, received verbal permission to admit to inpatient unit.  Phone/verbal permission given to RN, witnessed by 2nd [facility] staff x1

## 2023-07-05 NOTE — ED Notes (Signed)
 Lab onsite to redraw labs

## 2023-07-05 NOTE — Progress Notes (Signed)
 Admission Note:  79 yr Caucasian female who presents Voluntary, in no acute distress for the treatment of SI and Depression. Pt appears flat and depressed. Pt was calm and cooperative with admission process. Pt presents with passive SI, thoughts are to cut wrist but contracts for safety upon admission. Pt denies A/H but endorses seeing shadows. Pt has past medical Hx of HIV, and Bipolar D/O. Pt Skin was assessed and found to be unremarkable,  Food and fluids offered, and fluids accepted. Pt had no additional questions or concerns cn I just go to the dayroom, patient joined group which was in progress and was reported enjoyable Pt was placed on q 15 min rounds for safety and support.SABRA

## 2023-07-05 NOTE — ED Provider Notes (Addendum)
 Emergency Medicine Observation Re-evaluation Note  Crystal Black is a 28 y.o. female, seen on rounds today.  Pt initially presented to the ED for complaints of Suicidal  Currently, the patient is without issues.   Physical Exam  Blood pressure 123/82, pulse 92, temperature 98.9 F (37.2 C), temperature source Oral, resp. rate 18, height 5' 5 (1.651 m), weight 90.7 kg, SpO2 98%.  Physical Exam General: No apparent distress Pulm: Normal WOB Psych: resting     ED Course / MDM     I have reviewed the labs performed to date as well as medications administered while in observation.  Recent changes in the last 24 hours include none   Plan   Current plan is to continue to wait for psych placement   Patient is not under full IVC at this time.  Cbc normal , medically clear.    Ernest Ronal BRAVO, MD 07/05/23 [id number]    Ernest Ronal BRAVO, MD 07/05/23 0930

## 2023-07-06 DIAGNOSIS — F333 Major depressive disorder, recurrent, severe with psychotic symptoms: Secondary | ICD-10-CM

## 2023-07-06 MED ORDER — LINACLOTIDE 145 MCG PO CAPS
290.0000 ug | ORAL_CAPSULE | Freq: Every day | ORAL | Status: DC
  Administered 2023-07-07 – 2023-07-11 (×4): 290 ug via ORAL
  Filled 2023-07-06 (×6): qty 2

## 2023-07-06 MED ORDER — ARIPIPRAZOLE ER 400 MG IM SRER
300.0000 mg | INTRAMUSCULAR | Status: DC
  Administered 2023-07-09: 300 mg via INTRAMUSCULAR
  Filled 2023-07-06: qty 2

## 2023-07-06 MED ORDER — ARIPIPRAZOLE 5 MG PO TABS
5.0000 mg | ORAL_TABLET | Freq: Every day | ORAL | Status: DC
  Administered 2023-07-07 – 2023-07-08 (×2): 5 mg via ORAL
  Filled 2023-07-06 (×3): qty 1

## 2023-07-06 NOTE — Group Note (Signed)
 LCSW Group Therapy Note   Group Date: 07/06/2023 Start Time: 1300 End Time: 1425   Type of Therapy and Topic:  Group Therapy: Challenging Core Beliefs  Participation Level:  Active  Description of Group:  Patients were educated about core beliefs and asked to identify one harmful core belief that they have. Patients were asked to explore from where those beliefs originate. Patients were asked to discuss how those beliefs make them feel and the resulting behaviors of those beliefs. They were then be asked if those beliefs are true and, if so, what evidence they have to support them. Lastly, group members were challenged to replace those negative core beliefs with helpful beliefs.   Therapeutic Goals:   1. Patient will identify harmful core beliefs and explore the origins of such beliefs. 2. Patient will identify feelings and behaviors that result from those core beliefs. 3. Patient will discuss whether such beliefs are true. 4.  Patient will replace harmful core beliefs with helpful ones.  Summary of Patient Progress:  Patient actively engaged in processing and exploring how core beliefs are formed and how they impact thoughts, feelings, and behaviors. Patient proved open to input from peers and feedback from CSW. Patient demonstrated proficient  insight into the subject matter, was respectful and supportive of peers, and participated throughout the entire session.  Therapeutic Modalities: Cognitive Behavioral Therapy; Solution-Focused Therapy   Crystal Black, Crystal Black 07/06/2023  2:29 PM
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
## 2023-07-06 NOTE — Plan of Care (Signed)
  Problem: Education: Goal: Emotional status will improve Outcome: Progressing   Problem: Education: Goal: Mental status will improve Outcome: Progressing   Problem: Education: Goal: Verbalization of understanding the information provided will improve Outcome: Progressing   

## 2023-07-06 NOTE — Progress Notes (Signed)
   07/06/23 1000  Psych Admission Type (Psych Patients Only)  Admission Status Voluntary  Psychosocial Assessment  Patient Complaints Anxiety  Eye Contact Brief  Facial Expression Flat  Affect Flat  Speech Slow  Interaction Attention-seeking;Needy  Motor Activity Pacing;Slow  Appearance/Hygiene In scrubs  Behavior Characteristics Cooperative;Anxious;Pacing  Mood Anxious  Thought Process  Coherency Disorganized  Content Preoccupation  Delusions None reported or observed  Perception WDL;Hallucinations  Hallucination Auditory  Judgment Poor  Confusion Mild  Danger to Self  Current suicidal ideation? Passive  Self-Injurious Behavior No self-injurious ideation or behavior indicators observed or expressed   Agreement Not to Harm Self Yes  Description of Agreement verbal  Danger to Others  Danger to Others None reported or observed

## 2023-07-06 NOTE — Group Note (Signed)
 Date:  07/06/2023 Time:  10:11 AM  Group Topic/Focus:  Goals Group:   The focus of this group is to help patients establish daily goals to achieve during treatment and discuss how the patient can incorporate goal setting into their daily lives to aide in recovery.    Participation Level:  Minimal  Participation Quality:  Appropriate  Affect:  Appropriate  Cognitive:  Appropriate  Insight: Appropriate  Engagement in Group:  Engaged  Modes of Intervention:  Discussion, Education, and Support  Additional Comments:    Deitra Caron Mainland 07/06/2023, 10:11 AM

## 2023-07-06 NOTE — Group Note (Unsigned)
 Date:  07/06/2023 Time:  9:48 PM  Group Topic/Focus:  Making Healthy Choices:   The focus of this group is to help patients identify negative/unhealthy choices they were using prior to admission and identify positive/healthier coping strategies to replace them upon discharge.     Participation Level:  {B[facility] PARTICIPATION OZCZO:77735}  Participation Quality:  {B[facility] PARTICIPATION QUALITY:22265}  Affect:  {BHH AFFECT:22266}  Cognitive:  {BHH COGNITIVE:22267}  Insight: {BHH Insight2:20797}  Engagement in Group:  {BHH ENGAGEMENT IN HMNLE:77731}  Modes of Intervention:  {BHH MODES OF INTERVENTION:22269}  Additional Comments:  ***  Crystal Black 07/06/2023, 9:48 PM

## 2023-07-06 NOTE — [facility] Suicide Risk Assessment (Signed)
 [facility] INPATIENT:  Family/Significant Other Suicide Prevention Education  Suicide Prevention Education:  Education Completed; [name] [name]/guardian [phone]) , has been identified by the patient as the family member/significant other with whom the patient will be residing, and identified as the person(s) who will aid the patient in the event of a mental health crisis (suicidal ideations/suicide attempt).  With written consent from the patient, the family member/significant other has been provided the following suicide prevention education, prior to the and/or following the discharge of the patient.  The suicide prevention education provided includes the following: Suicide risk factors Suicide prevention and interventions National Suicide Hotline telephone number [facility] assessment telephone number [facility] Emergency Assistance 911 [facility] and/or Residential Mobile Crisis Unit telephone number  Request made of family/significant other to: Remove weapons (e.g., guns, rifles, knives), all items previously/currently identified as safety concern.   Remove drugs/medications (over-the-counter, prescriptions, illicit drugs), all items previously/currently identified as a safety concern.  The family member/significant other verbalizes understanding of the suicide prevention education information provided.  The family member/significant other agrees to remove the items of safety concern listed above.  Nadara JONELLE Fam 07/06/2023, 3:37 PM

## 2023-07-06 NOTE — Group Note (Signed)
 Recreation Therapy Group Note   Group Topic:Goal Setting  Group Date: 07/06/2023 Start Time: 1000 End Time: 1100 Facilitators: Celestia Jeoffrey BRAVO, LRT, CTRS Location:  Craft Room  Group Description: Product/process Development Scientist. Patients were given many different magazines, a glue stick, markers, and a piece of cardstock paper. LRT and pts discussed the importance of having goals in life. LRT and pts discussed the difference between short-term and long-term goals, as well as what a SMART goal is. LRT encouraged pts to create a vision board, with images they picked and then cut out with safety scissors from the magazine, for themselves, that capture their short and long-term goals. LRT encouraged pts to show and explain their vision board to the group.   Goal Area(s) Addressed:  Patient will gain knowledge of short vs. long term goals.  Patient will identify goals for themselves. Patient will practice setting SMART goals. Patient will verbalize their goals to LRT and peers.   Affect/Mood: N/A   Participation Level: Did not attend    Clinical Observations/Individualized Feedback: Leiliana did not attend group.   Plan: Continue to engage patient in RT group sessions 2-3x/week.   Jeoffrey BRAVO Celestia, LRT, CTRS  07/06/2023 12:55 PM
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
## 2023-07-06 NOTE — Group Note (Signed)
 Date:  07/06/2023 Time:  9:52 PM  Group Topic/Focus:  Making Healthy Choices:   The focus of this group is to help patients identify negative/unhealthy choices they were using prior to admission and identify positive/healthier coping strategies to replace them upon discharge.    Participation Level:  Active  Participation Quality:  Appropriate  Affect:  Appropriate  Cognitive:  Appropriate  Insight: Appropriate  Engagement in Group:  Engaged  Modes of Intervention:  Discussion   [name] 07/06/2023, 9:52 PM
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
## 2023-07-06 NOTE — Progress Notes (Signed)
 Nursing Shift Note:  1900-0700  Attended Evening Group: Yes Medication Compliant:  yes Behavior: anxious; attention seeking Sleep Quality: good Significant Changes Crystal Black endorsed passive S/I during evening shift and when further questioned said she is fearful she might do something when she wakes up feeling SI.  Pt verbally contracted for safety and agreed to notify staff if feelings of SI or self harm arise.  Pt remained safe and did not endorse any further SI feelings during the night.  Continued monitoring for safety.   07/06/23 0600  Psychosocial Assessment  Patient Complaints Anxiety  Eye Contact Fair  Facial Expression Flat  Affect Flat  Speech Slow  Interaction Attention-seeking  Motor Activity Pacing  Appearance/Hygiene Unremarkable  Behavior Characteristics Cooperative  Mood Depressed;Anxious  Thought Process  Coherency Concrete thinking  Content Preoccupation  Delusions WDL  Perception WDL  Hallucination Auditory  Judgment Poor  Confusion Mild  Danger to Self  Current suicidal ideation? Passive  Agreement Not to Harm Self Yes  Description of Agreement verbal  Danger to Others  Danger to Others None reported or observed

## 2023-07-07 DIAGNOSIS — F3160 Bipolar disorder, current episode mixed, unspecified: Principal | ICD-10-CM

## 2023-07-07 MED ORDER — MELATONIN 5 MG PO TABS
2.5000 mg | ORAL_TABLET | Freq: Every day | ORAL | Status: DC
  Administered 2023-07-07 – 2023-07-10 (×4): 2.5 mg via ORAL
  Filled 2023-07-07 (×4): qty 1

## 2023-07-07 NOTE — Group Note (Signed)
 Recreation Therapy Group Note   Group Topic:Leisure Education  Group Date: 07/07/2023 Start Time: 1000 End Time: 1100 Facilitators: Celestia Jeoffrey BRAVO, LRT, CTRS Location:  Craft Room  Group Description: Leisure. Patients were given the option to choose from singing karaoke, coloring mandalas, using oil pastels, journaling, or playing with play-doh. LRT and pts discussed the meaning of leisure, the importance of participating in leisure during their free time/when they're outside of the hospital, as well as how our leisure interests can also serve as coping skills.   Goal Area(s) Addressed:  Patient will identify a current leisure interest.  Patient will learn the definition of "leisure". Patient will practice making a positive decision. Patient will have the opportunity to try a new leisure activity. Patient will communicate with peers and LRT.    Affect/Mood: N/A   Participation Level: Did not attend    Clinical Observations/Individualized Feedback: Crystal Black did not attend group.   Plan: Continue to engage patient in RT group sessions 2-3x/week.   Jeoffrey BRAVO Celestia, LRT, CTRS 07/07/2023 12:02 PM

## 2023-07-07 NOTE — Plan of Care (Signed)

## 2023-07-07 NOTE — Progress Notes (Signed)
 Patient refused morning labs.

## 2023-07-07 NOTE — Progress Notes (Signed)
 Patient has been asleep and did not arouse when this writer called out to her twice. This Clinical research associate did not give 1400 Depakote nor Ensure due to this.

## 2023-07-07 NOTE — Progress Notes (Signed)
   07/06/23 2000  Psych Admission Type (Psych Patients Only)  Admission Status Voluntary  Psychosocial Assessment  Patient Complaints Anxiety  Eye Contact Brief  Facial Expression Flat  Affect Flat  Speech Slow  Interaction Attention-seeking  Motor Activity Pacing  Appearance/Hygiene In scrubs  Behavior Characteristics Appropriate to situation;Cooperative  Mood Anxious  Aggressive Behavior  Effect No apparent injury  Thought Process  Coherency Disorganized  Content Preoccupation  Delusions None reported or observed  Perception WDL;Hallucinations  Hallucination Auditory  Judgment Poor  Confusion Mild  Danger to Self  Current suicidal ideation? Passive  Self-Injurious Behavior No self-injurious ideation or behavior indicators observed or expressed   Agreement Not to Harm Self Yes  Description of Agreement verbal  Danger to Others  Danger to Others None reported or observed

## 2023-07-07 NOTE — Group Note (Signed)
 Date:  07/07/2023 Time:  10:31 AM  Group Topic/Focus:  Goals Group:   The focus of this group is to help patients establish daily goals to achieve during treatment and discuss how the patient can incorporate goal setting into their daily lives to aide in recovery.    Participation Level:  Did Not Attend   Deitra Clap [facility] 07/07/2023, 10:31 AM

## 2023-07-07 NOTE — [facility] Counselor (Signed)
 CSW contacted pt guardian, Kaleta Marsh [phone]) regarding aftercare. She shared that she would like to find pt another provider outside of [facility] as they expected her to be there for every appointment and she cannot do that as she does not live in the area. [name] also expressed that pt will be returning to the group home upon discharge. No other concerns expressed. Contact ended without incident.   Nadara SAUNDERS. Chaim, MSW, LCSW, LCAS 07/07/2023 3:37 PM

## 2023-07-07 NOTE — BH IP Treatment Plan (Signed)
 Interdisciplinary Treatment and Diagnostic Plan Update  07/07/2023 Time of Session: 09:25 Crystal Black MRN: [id number]  Principal Diagnosis: Severe recurrent depression with psychosis [facility])  Secondary Diagnoses: Principal Problem:   Severe recurrent depression with psychosis (HCC) Active Problems:   Suicidal ideations   Current Medications:  Current Facility-Administered Medications  Medication Dose Route Frequency Provider Last Rate Last Admin   acetaminophen  (TYLENOL ) tablet 650 mg  650 mg Oral Q6H PRN Lee, Jacqueline Eun, NP       alum & mag hydroxide-simeth (MAALOX/MYLANTA) 200-200-20 MG/5ML suspension 30 mL  30 mL Oral Q4H PRN Lee, Jacqueline Eun, NP       ARIPiprazole  (ABILIFY ) tablet 5 mg  5 mg Oral Daily Davis, Nina S, NP       [START ON 07/09/2023] ARIPiprazole  ER (ABILIFY  MAINTENA) injection 300 mg  300 mg Intramuscular Q28 days Nicholaus Brad RAMAN, NP       benztropine  (COGENTIN ) tablet 1 mg  1 mg Oral BID Davis, Nina S, NP   1 mg at 07/06/23 1815   darunavir -cobicistat  (PREZCOBIX ) 800-150 MG per tablet 1 tablet  1 tablet Oral Q breakfast Nicholaus Brad RAMAN, NP   1 tablet at 07/06/23 9062   haloperidol  (HALDOL ) tablet 5 mg  5 mg Oral TID PRN Lee, Jacqueline Eun, NP       And   diphenhydrAMINE  (BENADRYL ) capsule 50 mg  50 mg Oral TID PRN Lee, Jacqueline Eun, NP       haloperidol  lactate (HALDOL ) injection 5 mg  5 mg Intramuscular TID PRN Lee, Jacqueline Eun, NP       And   diphenhydrAMINE  (BENADRYL ) injection 50 mg  50 mg Intramuscular TID PRN Lee, Jacqueline Eun, NP       And   LORazepam  (ATIVAN ) injection 2 mg  2 mg Intramuscular TID PRN Lee, Jacqueline Eun, NP       haloperidol  lactate (HALDOL ) injection 10 mg  10 mg Intramuscular TID PRN Lee, Jacqueline Eun, NP       And   diphenhydrAMINE  (BENADRYL ) injection 50 mg  50 mg Intramuscular TID PRN Lee, Jacqueline Eun, NP       And   LORazepam  (ATIVAN ) injection 2 mg  2 mg Intramuscular TID PRN Lee, Jacqueline Eun, NP        divalproex  (DEPAKOTE  ER) 24 hr tablet 500 mg  500 mg Oral TID Nicholaus Brad RAMAN, NP   500 mg at 07/06/23 2109   docusate sodium  (COLACE) capsule 100 mg  100 mg Oral Daily Nicholaus Brad RAMAN, NP   100 mg at 07/06/23 9062   dolutegravir  (TIVICAY ) tablet 50 mg  50 mg Oral Daily Nicholaus Brad RAMAN, NP   50 mg at 07/06/23 9061   famotidine  (PEPCID ) tablet 20 mg  20 mg Oral Daily Nicholaus Brad RAMAN, NP   20 mg at 07/06/23 9062   feeding supplement (ENSURE ENLIVE / ENSURE PLUS) liquid 237 mL  237 mL Oral BID BM Nicholaus Brad RAMAN, NP   237 mL at 07/06/23 1523   FLUoxetine  (PROZAC ) capsule 40 mg  40 mg Oral QHS Nicholaus Brad RAMAN, NP   40 mg at 07/06/23 2109   hydrOXYzine  (ATARAX ) tablet 50 mg  50 mg Oral TID PRN Davis, Nina S, NP   50 mg at 07/06/23 2110   ibuprofen  (ADVIL ) tablet 800 mg  800 mg Oral Q8H PRN Nicholaus Brad RAMAN, NP       linaclotide  (LINZESS ) capsule 290 mcg  290 mcg Oral QAC breakfast Niels Kayla FALCON,  RPH       magnesium  hydroxide (MILK OF MAGNESIA) suspension 30 mL  30 mL Oral Daily PRN Lee, Jacqueline Eun, NP       melatonin tablet 5 mg  5 mg Oral QHS Nicholaus Brad RAMAN, NP   5 mg at 07/06/23 2110   nicotine  polacrilex (NICORETTE ) gum 2 mg  2 mg Oral PRN Davis, Nina S, NP   2 mg at 07/06/23 1817   topiramate  (TOPAMAX ) tablet 50 mg  50 mg Oral BID Davis, Nina S, NP   50 mg at 07/06/23 1815   traZODone  (DESYREL ) tablet 50 mg  50 mg Oral QHS PRN Lee, Jacqueline Eun, NP   50 mg at 07/06/23 2109   PTA Medications: Medications Prior to Admission  Medication Sig Dispense Refill Last Dose/Taking   albuterol  (VENTOLIN  HFA) 108 (90 Base) MCG/ACT inhaler Inhale 2 puffs into the lungs every 4 (four) hours as needed for wheezing or shortness of breath.      benztropine  (COGENTIN ) 0.5 MG tablet Take 1 tablet (0.5 mg total) by mouth 2 (two) times daily. (Patient taking differently: Take 0.5 mg by mouth at bedtime.) 60 tablet 1    darunavir -cobicistat  (PREZCOBIX ) 800-150 MG tablet Take 1 tablet by mouth daily with breakfast. Swallow  whole. Do NOT crush, break or chew tablets. Take with food. 30 tablet 1    divalproex  (DEPAKOTE ) 500 MG DR tablet Take 1 tablet (500 mg total) by mouth 3 (three) times daily. 90 tablet 0    docusate sodium  (COLACE) 100 MG capsule Take 300 mg by mouth daily.      dolutegravir  (TIVICAY ) 50 MG tablet Take 1 tablet (50 mg total) by mouth daily. 30 tablet 1    EPINEPHrine  0.3 mg/0.3 mL IJ SOAJ injection Inject 0.3 mg into the muscle as needed for anaphylaxis.      etonogestrel  (NEXPLANON ) 68 MG IMPL implant 1 each (68 mg total) by Subdermal route once for 1 dose. 1 each 0    famotidine  (PEPCID ) 20 MG tablet Take 20 mg by mouth daily as needed for heartburn.      Fiber Adult Gummies 2 g CHEW Chew 1 Units by mouth 2 (two) times daily.      FLUoxetine  (PROZAC ) 40 MG capsule Take 40 mg by mouth at bedtime.      glycerin adult 2 g suppository Place 1 suppository rectally as needed for constipation.      hydrOXYzine  (ATARAX ) 25 MG tablet Take 1 tablet (25 mg total) by mouth 3 (three) times daily as needed. 60 tablet 1    ibuprofen  (ADVIL ) 800 MG tablet Take 1 tablet (800 mg total) by mouth every 8 (eight) hours as needed for moderate pain. 15 tablet 0    INVEGA SUSTENNA 156 MG/ML SUSY injection Inject 234 mg into the muscle every 30 (thirty) days.      INVEGA SUSTENNA 234 MG/1.5ML injection Inject 234 mg into the muscle once.      ketoconazole (NIZORAL) 2 % shampoo Apply 1 Application topically. Monday/ Wednesday/ Friday      linaclotide  (LINZESS ) 290 MCG CAPS capsule Take 290 mcg by mouth daily before breakfast.      magnesium  citrate SOLN Take 1 Bottle by mouth once.      montelukast  (SINGULAIR ) 10 MG tablet Take 10 mg by mouth daily.      mupirocin  ointment (BACTROBAN ) 2 % Apply 1 Application topically 2 (two) times daily. (Patient not taking: Reported on 07/04/2023) 22 g 0    nicotine   polacrilex (NICORETTE ) 2 MG gum Take 2 mg by mouth as needed for smoking cessation.      nystatin -triamcinolone  ointment  (MYCOLOG) Apply 1 Application topically 2 (two) times daily. (Patient not taking: Reported on 07/04/2023) 30 g 0    prazosin  (MINIPRESS ) 5 MG capsule Take 1 capsule (5 mg total) by mouth at bedtime. 30 capsule 1    risperiDONE  (RISPERDAL ) 0.5 MG tablet Take 1 tablet (0.5 mg total) by mouth 2 (two) times daily at 8 am and 4 pm. 30 tablet 0    topiramate  (TOPAMAX ) 50 MG tablet Take 1 tablet (50 mg total) by mouth 2 (two) times daily. (Patient taking differently: Take 50 mg by mouth at bedtime.) 60 tablet 1    traZODone  (DESYREL ) 50 MG tablet Take 50 mg by mouth at bedtime.       Patient Stressors:    Patient Strengths:    Treatment Modalities: Medication Management, Group therapy, Case management,  1 to 1 session with clinician, Psychoeducation, Recreational therapy.   Physician Treatment Plan for Primary Diagnosis: Severe recurrent depression with psychosis (HCC) Long Term Goal(s): Improvement in symptoms so as ready for discharge   Short Term Goals: Ability to identify changes in lifestyle to reduce recurrence of condition will improve Ability to verbalize feelings will improve Ability to disclose and discuss suicidal ideas Ability to demonstrate self-control will improve Ability to identify and develop effective coping behaviors will improve Ability to maintain clinical measurements within normal limits will improve Compliance with prescribed medications will improve Ability to identify triggers associated with substance abuse/mental health issues will improve  Medication Management: Evaluate patient's response, side effects, and tolerance of medication regimen.  Therapeutic Interventions: 1 to 1 sessions, Unit Group sessions and Medication administration.  Evaluation of Outcomes: Progressing  Physician Treatment Plan for Secondary Diagnosis: Principal Problem:   Severe recurrent depression with psychosis (HCC) Active Problems:   Suicidal ideations  Long Term Goal(s): Improvement  in symptoms so as ready for discharge   Short Term Goals: Ability to identify changes in lifestyle to reduce recurrence of condition will improve Ability to verbalize feelings will improve Ability to disclose and discuss suicidal ideas Ability to demonstrate self-control will improve Ability to identify and develop effective coping behaviors will improve Ability to maintain clinical measurements within normal limits will improve Compliance with prescribed medications will improve Ability to identify triggers associated with substance abuse/mental health issues will improve     Medication Management: Evaluate patient's response, side effects, and tolerance of medication regimen.  Therapeutic Interventions: 1 to 1 sessions, Unit Group sessions and Medication administration.  Evaluation of Outcomes: Progressing   RN Treatment Plan for Primary Diagnosis: Severe recurrent depression with psychosis (HCC) Long Term Goal(s): Knowledge of disease and therapeutic regimen to maintain health will improve  Short Term Goals: Ability to remain free from injury will improve, Ability to verbalize frustration and anger appropriately will improve, Ability to demonstrate self-control, Ability to participate in decision making will improve, Ability to verbalize feelings will improve, Ability to disclose and discuss suicidal ideas, Ability to identify and develop effective coping behaviors will improve, and Compliance with prescribed medications will improve  Medication Management: RN will administer medications as ordered by provider, will assess and evaluate patient's response and provide education to patient for prescribed medication. RN will report any adverse and/or side effects to prescribing provider.  Therapeutic Interventions: 1 on 1 counseling sessions, Psychoeducation, Medication administration, Evaluate responses to treatment, Monitor vital signs and CBGs as ordered, Perform/monitor CIWA,  COWS, AIMS  and Fall Risk screenings as ordered, Perform wound care treatments as ordered.  Evaluation of Outcomes: Progressing   LCSW Treatment Plan for Primary Diagnosis: Severe recurrent depression with psychosis (HCC) Long Term Goal(s): Safe transition to appropriate next level of care at discharge, Engage patient in therapeutic group addressing interpersonal concerns.  Short Term Goals: Engage patient in aftercare planning with referrals and resources, Increase social support, Increase ability to appropriately verbalize feelings, Increase emotional regulation, Facilitate acceptance of mental health diagnosis and concerns, and Increase skills for wellness and recovery  Therapeutic Interventions: Assess for all discharge needs, 1 to 1 time with Social worker, Explore available resources and support systems, Assess for adequacy in community support network, Educate family and significant other(s) on suicide prevention, Complete Psychosocial Assessment, Interpersonal group therapy.  Evaluation of Outcomes: Progressing   Progress in Treatment: Attending groups: Yes. Participating in groups: Yes. Taking medication as prescribed: Yes. Toleration medication: Yes. Family/Significant other contact made: Yes, individual(s) contacted:  guardian, Kaleta Marsh.  Patient understands diagnosis: Yes. Discussing patient identified problems/goals with staff: Yes. Medical problems stabilized or resolved: Yes. Denies suicidal/homicidal ideation: Yes. Issues/concerns per patient self-inventory: No. Other: none.  New problem(s) identified: No, Describe:  none identified.  New Short Term/Long Term Goal(s): medication management for mood stabilization; elimination of SI thoughts; development of comprehensive mental wellness plan.  Patient Goals:  My goal is to find a safe place where I don't keep coming to the hospital.  Discharge Plan or Barriers: CSW will assist pt with development of an appropriate  aftercare/discharge plan.   Reason for Continuation of Hospitalization: Depression Medication stabilization Suicidal ideation  Estimated Length of Stay: 1-7 days  Last 3 [place] Suicide Severity Risk Score: Flowsheet Row Admission (Current) from 07/05/2023 in [facility]INE ED from 07/04/2023 in [facility] Department at [facility] ED from 06/23/2023 in [facility] Emergency Department at [facility]  C-SSRS RISK CATEGORY High Risk High Risk No Risk       Last PHQ 2/9 Scores:    05/04/2023    9:21 AM 02/16/2023    9:49 AM 11/17/2022   10:53 AM  Depression screen PHQ 2/9  Decreased Interest 0 0 0  Down, Depressed, Hopeless 0 0 1  PHQ - 2 Score 0 0 1    Scribe for Treatment Team: Nadara JONELLE Fam, LCSW 07/07/2023 9:46 AM

## 2023-07-07 NOTE — Progress Notes (Addendum)
   07/07/23 1400  Spiritual Encounters  Type of Visit Initial  Care provided to: Patient  Referral source Patient request  Reason for visit Urgent spiritual support  OnCall Visit No  Spiritual Framework  Presenting Themes Values and beliefs  Values/beliefs Why is God allowing bad things to happen?  Strengths Staying in relationship with others.  Patient Stress Factors Family relationships;Major life changes  Family Stress Factors Family relationships  Goals  Self/Personal Goals Chaplain will ask patient to share ways that love was experienced since the last Chaplain visit.  Intervention Outcomes  Outcomes Reduced anxiety  Spiritual Care Plan  Spiritual Care Issues Still Outstanding Chaplain will continue to follow   While Chaplain was rounding on the [facility] Unit, patient requested a Chaplain visit.  Chaplain assessed spiritual needs of the patient while meeting.  Patient shared she had been incorrectly informed that her mother passed away and this misinformation created great anxiety.  Patient questioned why the hardships she experiences persist and why a loving God allows things to keep happening to her.  Patient manages this anxiety by staying in connection with those who encourage her and keep her spirits uplifted.  Her time on the Unit has been meaningful and helpful.  Chaplain asked patient to identify more areas where she sees the love of God displayed in the midst of hardships during the next Chaplain visit.  Chaplain offered prayer and prayed with patient.  Chaplain will follow-up with patient during the week of 07/10/2023.  Rev. Rana M. Nicholaus, MDiv. Chaplain Resident  [facility]

## 2023-07-07 NOTE — Plan of Care (Signed)
  Problem: Education: Goal: Knowledge of [facility] Education information/materials will improve Outcome: Not Progressing   Problem: Activity: Goal: Interest or engagement in activities will improve Outcome: Not Progressing Goal: Sleeping patterns will improve Outcome: Not Progressing
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
## 2023-07-07 NOTE — Progress Notes (Signed)
   07/07/23 1200  Psych Admission Type (Psych Patients Only)  Admission Status Voluntary  Psychosocial Assessment  Patient Complaints Anxiety;Depression;Hopelessness;Other (Comment) (per patient, she is too drowsy in the morning from sleep medications)  Eye Contact Fair  Facial Expression Flat  Affect Flat  Speech Soft;Slow  Interaction Childlike;Assertive  Motor Activity Slow  Appearance/Hygiene Unremarkable  Behavior Characteristics Cooperative;Appropriate to situation  Mood Pleasant (patient stated during treatment team that her thoughts aren't as bad as when she was admitted.)  Aggressive Behavior  Effect No apparent injury  Thought Process  Coherency WDL  Content WDL  Delusions None reported or observed  Perception WDL (patient reports no hallucinations at the time of assessment)  Hallucination None reported or observed  Judgment WDL  Confusion None  Danger to Self  Current suicidal ideation? Denies  Self-Injurious Behavior No self-injurious ideation or behavior indicators observed or expressed   Agreement Not to Harm Self Yes  Description of Agreement Verbal  Danger to Others  Danger to Others None reported or observed

## 2023-07-08 DIAGNOSIS — F3160 Bipolar disorder, current episode mixed, unspecified: Secondary | ICD-10-CM | POA: Diagnosis not present

## 2023-07-08 LAB — LIPID PANEL
Cholesterol: 198 mg/dL (ref 0–200)
HDL: 23 mg/dL — ABNORMAL LOW (ref >40)
LDL Cholesterol: 150 mg/dL — ABNORMAL HIGH (ref 0–99)
Total CHOL/HDL Ratio: 8.6 {ratio}
Triglycerides: 124 mg/dL (ref <150)
VLDL: 25 mg/dL (ref 0–40)

## 2023-07-08 LAB — HEMOGLOBIN A1C
Hgb A1c MFr Bld: 6.2 % — ABNORMAL HIGH (ref 4.8–5.6)
Mean Plasma Glucose: 131.24 mg/dL

## 2023-07-08 LAB — VALPROIC ACID LEVEL: Valproic Acid Lvl: 116 ug/mL — ABNORMAL HIGH (ref 50.0–100.0)

## 2023-07-08 MED ORDER — MAGNESIUM CITRATE PO SOLN
1.0000 | Freq: Once | ORAL | Status: AC
  Administered 2023-07-08: 1 via ORAL
  Filled 2023-07-08: qty 296

## 2023-07-08 NOTE — Progress Notes (Signed)
   07/07/23 2016  Psych Admission Type (Psych Patients Only)  Admission Status Voluntary  Psychosocial Assessment  Patient Complaints Anxiety  Eye Contact Brief  Facial Expression Flat  Affect Flat  Speech Slow  Interaction Attention-seeking  Motor Activity Pacing  Appearance/Hygiene In scrubs  Behavior Characteristics Appropriate to situation;Cooperative  Aggressive Behavior  Effect No apparent injury  Thought Process  Coherency Disorganized  Content Preoccupation  Delusions None reported or observed  Perception WDL;Hallucinations  Hallucination Auditory  Judgment Poor  Confusion Mild  Danger to Self  Current suicidal ideation? Passive  Self-Injurious Behavior No self-injurious ideation or behavior indicators observed or expressed   Agreement Not to Harm Self Yes  Description of Agreement verbal  Danger to Others  Danger to Others None reported or observed

## 2023-07-08 NOTE — Progress Notes (Signed)
   07/08/23 1125  Charting Type  Charting Type Shift assessment  Safety Check Verification  Has the RN verified the 15 minute safety check completion? Yes  Neurological  Neuro (WDL) WDL  HEENT  HEENT (WDL) WDL  Respiratory  Respiratory (WDL) WDL  Cardiac  Cardiac (WDL) WDL  Pulse Regular  Vascular  Vascular (WDL) WDL  Integumentary  Integumentary (WDL) WDL  Braden Scale (Ages 8 and up)  Sensory Perceptions 4  Moisture 4  Activity 4  Mobility 4  Nutrition 3  Friction and Shear 3  Braden Scale Score 22  Braden Interventions  Braden Scale Interventions No interventions  Musculoskeletal  Musculoskeletal (WDL) WDL  Assistive Device None  Gastrointestinal  Gastrointestinal (WDL) WDL  Last BM Date  07/07/23  GU Assessment  Genitourinary (WDL) WDL  Neurological  Level of Consciousness Alert

## 2023-07-08 NOTE — Plan of Care (Signed)
  Problem: Education: Goal: Knowledge of [place] [facility] Education information/materials will improve Outcome: Progressing   Problem: Activity: Goal: Interest or engagement in activities will improve Outcome: Progressing Goal: Sleeping patterns will improve Outcome: Progressing   
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
## 2023-07-08 NOTE — Group Note (Signed)
 Date:  07/07/2023 Time:  8?15 PM  Group Topic/Focus:  Goals Group:   The focus of this group is to help patients establish daily goals to achieve during treatment and discuss how the patient can incorporate goal setting into their daily lives to aide in recovery.    Participation Level:  Active  Participation Quality:  Appropriate  Affect:  Appropriate  Cognitive:  Appropriate  Insight: Appropriate  Engagement in Group:  Engaged  Modes of Intervention:  Discussion   [name] 07/08/2023, 3:17 AM

## 2023-07-09 DIAGNOSIS — F3160 Bipolar disorder, current episode mixed, unspecified: Secondary | ICD-10-CM | POA: Diagnosis not present

## 2023-07-09 MED ORDER — ARIPIPRAZOLE 2 MG PO TABS
2.0000 mg | ORAL_TABLET | Freq: Every day | ORAL | Status: DC
  Administered 2023-07-10 – 2023-07-11 (×2): 2 mg via ORAL
  Filled 2023-07-09 (×2): qty 1
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
## 2023-07-09 NOTE — Group Note (Unsigned)
 Date:  07/09/2023 Time:  9:06 PM  Group Topic/Focus:  Making Healthy Choices:   The focus of this group is to help patients identify negative/unhealthy choices they were using prior to admission and identify positive/healthier coping strategies to replace them upon discharge.     Participation Level:  {BHH PARTICIPATION OZCZO:77735}  Participation Quality:  {BHH PARTICIPATION QUALITY:22265}  Affect:  {BHH AFFECT:22266}  Cognitive:  {BHH COGNITIVE:22267}  Insight: {BHH Insight2:20797}  Engagement in Group:  {BHH ENGAGEMENT IN HMNLE:77731}  Modes of Intervention:  {BHH MODES OF INTERVENTION:22269}  Additional Comments:  ***  Crystal Black 07/09/2023, 9:06 PM

## 2023-07-09 NOTE — Group Note (Signed)
 Date:  07/09/2023 Time:  9:44 PM  Group Topic/Focus:  Making Healthy Choices:   The focus of this group is to help patients identify negative/unhealthy choices they were using prior to admission and identify positive/healthier coping strategies to replace them upon discharge.    Participation Level:  Active  Participation Quality:  Appropriate  Affect:  Appropriate  Cognitive:  Appropriate  Insight: Appropriate  Engagement in Group:  Engaged  Modes of Intervention:  Discussion   [name] 07/09/2023, 9:44 PM

## 2023-07-09 NOTE — Progress Notes (Signed)
 D- Patient alert and oriented.  Denies SI, HI, AVH, and pain. Patient refused morning medications, including Abilify  Maintena, stating Just leave me alone, and I want to be left alone. Leave my room. Patient irritable upon assessment.  A- Support and encouragement provided.  Routine safety checks conducted every 15 minutes.  Patient informed to notify staff with problems or concerns.  R- Patient contracts for safety at this time.  Patient interacts well with others on the unit.  Patient remains safe at this time.

## 2023-07-09 NOTE — Plan of Care (Signed)
   Problem: Education: Goal: Emotional status will improve Outcome: Not Progressing Goal: Mental status will improve Outcome: Not Progressing Goal: Verbalization of understanding the information provided will improve Outcome: Not Progressing

## 2023-07-09 NOTE — Group Note (Unsigned)
 Date:  07/09/2023 Time:  4:59 AM  Group Topic/Focus:  Wrap-Up Group:   The focus of this group is to help patients review their daily goal of treatment and discuss progress on daily workbooks.     Participation Level:  {BHH PARTICIPATION OZCZO:77735}  Participation Quality:  {BHH PARTICIPATION QUALITY:22265}  Affect:  {BHH AFFECT:22266}  Cognitive:  {BHH COGNITIVE:22267}  Insight: {BHH Insight2:20797}  Engagement in Group:  {BHH ENGAGEMENT IN HMNLE:77731}  Modes of Intervention:  {BHH MODES OF INTERVENTION:22269}  Additional Comments:  ***  Prentice Ace 07/09/2023, 4:59 AM

## 2023-07-10 DIAGNOSIS — J029 Acute pharyngitis, unspecified: Secondary | ICD-10-CM | POA: Diagnosis not present

## 2023-07-10 DIAGNOSIS — F3160 Bipolar disorder, current episode mixed, unspecified: Secondary | ICD-10-CM | POA: Diagnosis not present

## 2023-07-10 MED ORDER — DIVALPROEX SODIUM ER 250 MG PO TB24
250.0000 mg | ORAL_TABLET | Freq: Two times a day (BID) | ORAL | Status: DC
  Administered 2023-07-11: 250 mg via ORAL
  Filled 2023-07-10 (×2): qty 1

## 2023-07-10 MED ORDER — DIPHENHYDRAMINE HCL 25 MG PO CAPS
25.0000 mg | ORAL_CAPSULE | Freq: Once | ORAL | Status: AC
  Administered 2023-07-10: 25 mg via ORAL
  Filled 2023-07-10: qty 1

## 2023-07-10 NOTE — Progress Notes (Addendum)
 Assumed care of patient this pm, he/she present A&O x 3,  affect & mood. Pt out in the milieu, sociable, behavior appropriate to situation and was with complaints of anxiety, sore tongue and throat. Provider notified, and ordered benadryl  and she had a medical consult. Pt denied thoughts, plan or intent to harm self or others and made a safety commitment, [place]. Pt is compliant with taking medications and following plan of care. Pt educated on plan of care, medication regimen and she is maintained on q 15 min rounds.     07/10/23 1600  Psych Admission Type (Psych Patients Only)  Admission Status Voluntary  Psychosocial Assessment  Patient Complaints Anxiety  Eye Contact Fair  Facial Expression Flat  Affect Depressed  Speech Slow  Interaction Assertive;Childlike  Motor Activity Slow  Appearance/Hygiene In scrubs  Behavior Characteristics Anxious  Mood Pleasant  Thought Process  Coherency Disorganized  Content Preoccupation  Delusions None reported or observed  Perception WDL  Hallucination None reported or observed  Judgment Limited  Confusion Mild  Danger to Self  Current suicidal ideation? Denies  Self-Injurious Behavior No self-injurious ideation or behavior indicators observed or expressed   Agreement Not to Harm Self Yes  Description of Agreement verbal  Danger to Others  Danger to Others None reported or observed

## 2023-07-10 NOTE — Progress Notes (Signed)
   07/10/23 2300  Psych Admission Type (Psych Patients Only)  Admission Status Voluntary  Psychosocial Assessment  Patient Complaints Anxiety  Eye Contact Brief  Facial Expression Flat  Affect Depressed  Speech Slow  Interaction Needy;Childlike  Motor Activity Tremors  Appearance/Hygiene In hospital gown  Behavior Characteristics Anxious  Mood Pleasant  Thought Process  Coherency Disorganized  Content Preoccupation  Delusions None reported or observed  Perception WDL  Hallucination None reported or observed  Judgment Limited  Confusion Mild  Danger to Self  Current suicidal ideation? Denies (Denies)  Self-Injurious Behavior No self-injurious ideation or behavior indicators observed or expressed  (None)  Agreement Not to Harm Self Yes  Description of Agreement Verbal  Danger to Others  Danger to Others None reported or observed
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
## 2023-07-10 NOTE — Group Note (Signed)
 Recreation Therapy Group Note   Group Topic:Emotion Expression  Group Date: 07/10/2023 Start Time: 1530 End Time: 1615 Facilitators: Celestia Jeoffrey FORBES ARTICE, CTRS Location:  Craft Room  Group Description: Painting a Peaceful Place. Patients and LRT discuss what it means to be "at peace", what it feels like physically and mentally. Pts are given a canvas and watercolor paint to use and encouraged to draw their idea of a peaceful place. Pts and LRT discuss how they use this in their daily life post discharge. Pts are encouraged to take their canvas home with them as a reminder to find their peaceful place whenever they are feeling depressed, anxious, etc.    Goal Area(s) Addressed:  Patient will identify what it means to experience a "peaceful" emotion. Patient will identify a new coping skill.  Patient will express their emotions through art. Patients will increase communication by talking with LRT and peers while in group.   Affect/Mood: Appropriate   Participation Level: Active and Engaged   Participation Quality: Independent   Behavior: Appropriate, Calm, and Cooperative   Speech/Thought Process: Coherent   Insight: Good   Judgement: Good   Modes of Intervention: Art, Exploration, and Guided Discussion   Patient Response to Interventions:  Attentive, Engaged, Interested , and Receptive   Education Outcome:  Acknowledges education   Clinical Observations/Individualized Feedback: Crystal Black was active in their participation of session activities and group discussion. Pt identified the beach and mountains are her peaceful place. Pt painted a picture of the beach. Pt interacted well with LRT and peers duration of session.    Plan: Continue to engage patient in RT group sessions 2-3x/week.   Jeoffrey FORBES Celestia, LRT, CTRS 07/10/2023 5:44 PM

## 2023-07-10 NOTE — Plan of Care (Signed)

## 2023-07-10 NOTE — Progress Notes (Signed)
 Patient denies, SI, HI & AVH. She was cooperative with medication on the shift, visible in the milieu, no new issues to report on shift at his time.

## 2023-07-10 NOTE — Plan of Care (Signed)
  Problem: Education: Goal: Knowledge of [facility] information/materials will improve Outcome: Progressing Goal: Emotional status will improve Outcome: Progressing Goal: Mental status will improve Outcome: Progressing Goal: Verbalization of understanding the information provided will improve Outcome: Progressing   Problem: Activity: Goal: Interest or engagement in activities will improve Outcome: Progressing Goal: Sleeping patterns will improve Outcome: Progressing   Problem: Coping: Goal: Ability to verbalize frustrations and anger appropriately will improve Outcome: Progressing Goal: Ability to demonstrate self-control will improve Outcome: Progressing   Problem: Activity: Goal: Interest or engagement in activities will improve Outcome: Progressing Goal: Sleeping patterns will improve Outcome: Progressing   Problem: Coping: Goal: Ability to verbalize frustrations and anger appropriately will improve Outcome: Progressing Goal: Ability to demonstrate self-control will improve Outcome: Progressing   Problem: Health Behavior/Discharge Planning: Goal: Identification of resources available to assist in meeting health care needs will improve Outcome: Progressing Goal: Compliance with treatment plan for underlying cause of condition will improve Outcome: Progressing   Problem: Physical Regulation: Goal: Ability to maintain clinical measurements within normal limits will improve Outcome: Progressing   Problem: Safety: Goal: Periods of time without injury will increase Outcome: Progressing

## 2023-07-10 NOTE — Plan of Care (Signed)
   Problem: Education: Goal: Mental status will improve Outcome: Progressing   Problem: Education: Goal: Verbalization of understanding the information provided will improve Outcome: Progressing

## 2023-07-10 NOTE — Group Note (Signed)
 [facility] LCSW Group Therapy Note    Group Date: 07/10/2023 Start Time: 1330 End Time: 1430  Type of Therapy and Topic:  Group Therapy:  Overcoming Obstacles  Participation Level:  [facility] PARTICIPATION LEVEL: Active  Mood:  Description of Group:   In this group patients will be encouraged to explore what they see as obstacles to their own wellness and recovery. They will be guided to discuss their thoughts, feelings, and behaviors related to these obstacles. The group will process together ways to cope with barriers, with attention given to specific choices patients can make. Each patient will be challenged to identify changes they are motivated to make in order to overcome their obstacles. This group will be process-oriented, with patients participating in exploration of their own experiences as well as giving and receiving support and challenge from other group members.  Therapeutic Goals: 1. Patient will identify personal and current obstacles as they relate to admission. 2. Patient will identify barriers that currently interfere with their wellness or overcoming obstacles.  3. Patient will identify feelings, thought process and behaviors related to these barriers. 4. Patient will identify two changes they are willing to make to overcome these obstacles:    Summary of Patient Progress Patient was present in group.  Patient was an active participant and supportive of other group members.  Patient was engaged and insightful. Patient was able to discuss her use of coping skills.    Therapeutic Modalities:   Cognitive Behavioral Therapy Solution Focused Therapy Motivational Interviewing Relapse Prevention Therapy   Sherryle JINNY Margo, LCSW

## 2023-07-10 NOTE — Group Note (Signed)
 Date:  07/10/2023 Time:  10:47 AM  Group Topic/Focus:  Dimensions of Wellness:   The focus of this group is to introduce the topic of wellness and discuss the role each dimension of wellness plays in total health. Identifying Needs:   The focus of this group is to help patients identify their personal needs that have been historically problematic and identify healthy behaviors to address their needs.    Participation Level:  Active  Participation Quality:  Appropriate  Affect:  Appropriate  Cognitive:  Appropriate  Insight: Appropriate  Engagement in Group:  Developing/Improving and Engaged  Modes of Intervention:  Activity, Discussion, and Support  Additional Comments:    Crystal Black 07/10/2023, 10:47 AM

## 2023-07-10 NOTE — [facility] Counselor (Signed)
 CSW spoke with the patient's guardian.  CSW informed of plans for patient to be discharged tomorrow 07/11/2023.  Guardian expressed understanding and was ok with discharge.  Guardian provided verbal permission for the pts treatment team to discuss medications with group home.   CSW spoke with group home and informed of the expected discharge.  NP Brad discussed with the group home the patient's medications.  CSW asked about follow up and was informed that patient will see a provider in house at the group home and they will establish the appointment.    Ph[facility] [place] [facility] [address] [place].  Per NP Brad medications have been sent there.      Sherryle Margo, MSW, LCSW 07/10/2023 3:12 PM

## 2023-07-10 NOTE — Group Note (Signed)
 Recreation Therapy Group Note   Group Topic:Healthy Support Systems  Group Date: 07/10/2023 Start Time: 1010 End Time: 1110 Facilitators: Celestia Jeoffrey BRAVO, LRT, CTRS Location:  Craft Room  Group Description: Straw Bridge. In groups or individually, patients were given 10 plastic drinking straws and an equal length of masking tape. Using the materials provided, patients were instructed to build a free-standing bridge-like structure to suspend an everyday item (ex: deck of cards) off the floor or table surface. All materials were required to be used in secondary school teacher. LRT facilitated post-activity discussion reviewing the importance of having strong and healthy support systems in our lives. LRT discussed how the people in our lives serve as the tape and the deck of cards we placed on top of our straw structure are the stressors we face in daily life. LRT and pts discussed what happens in our life when things get too heavy for us , and we don't have strong supports outside of the hospital. Pt shared 2 of their healthy supports in their life aloud in the group.   Goal Area(s) Addressed:  Patient will identify 2 healthy supports in their life. Patient will identify skills to successfully complete activity. Patient will identify correlation of this activity to life post-discharge.  Patient will build on frustration tolerance skills. Patient will increase team building and communication skills   Affect/Mood: Appropriate   Participation Level: Active and Engaged   Participation Quality: Independent   Behavior: Calm and Cooperative   Speech/Thought Process: Coherent   Insight: Fair   Judgement: Good   Modes of Intervention: Activity, Exploration, Guided Discussion, and STEM Activity   Patient Response to Interventions:  Attentive and Receptive   Education Outcome:  Acknowledges education   Clinical Observations/Individualized Feedback: Jaxie was active in their participation of session  activities and group discussion. Pt identified my family, well...my adopted family and pets as her healthy supports. Pt attempted to make a structure, however, gave up despite encouragement. Pt interacted well with LRT and peers duration of session.    Plan: Continue to engage patient in RT group sessions 2-3x/week.   Jeoffrey BRAVO Celestia, LRT, CTRS 07/10/2023 12:06 PM
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
## 2023-07-11 ENCOUNTER — Emergency Department
Admission: EM | Admit: 2023-07-11 | Discharge: 2023-07-12 | Disposition: A | Discharge status: Home / Self Care | Payer: MEDICAID | Attending: Emergency Medicine | Admitting: Emergency Medicine

## 2023-07-11 ENCOUNTER — Other Ambulatory Visit: Payer: Self-pay

## 2023-07-11 DIAGNOSIS — Z21 Asymptomatic human immunodeficiency virus [HIV] infection status: Secondary | ICD-10-CM | POA: Insufficient documentation

## 2023-07-11 DIAGNOSIS — J45909 Unspecified asthma, uncomplicated: Secondary | ICD-10-CM | POA: Insufficient documentation

## 2023-07-11 DIAGNOSIS — J029 Acute pharyngitis, unspecified: Secondary | ICD-10-CM | POA: Insufficient documentation

## 2023-07-11 DIAGNOSIS — R1011 Right upper quadrant pain: Secondary | ICD-10-CM | POA: Insufficient documentation

## 2023-07-11 DIAGNOSIS — Z20822 Contact with and (suspected) exposure to covid-19: Secondary | ICD-10-CM | POA: Insufficient documentation

## 2023-07-11 DIAGNOSIS — Z716 Tobacco abuse counseling: Secondary | ICD-10-CM

## 2023-07-11 DIAGNOSIS — R112 Nausea with vomiting, unspecified: Secondary | ICD-10-CM | POA: Insufficient documentation

## 2023-07-11 LAB — URINALYSIS, ROUTINE W REFLEX MICROSCOPIC
Bilirubin Urine: NEGATIVE
Glucose, UA: 50 mg/dL — AB
Hgb urine dipstick: NEGATIVE
Ketones, ur: NEGATIVE mg/dL
Leukocytes,Ua: NEGATIVE
Nitrite: NEGATIVE
Protein, ur: NEGATIVE mg/dL
Specific Gravity, Urine: 1.024 (ref 1.005–1.030)
pH: 8 (ref 5.0–8.0)

## 2023-07-11 LAB — CBC WITH DIFFERENTIAL/PLATELET
Abs Immature Granulocytes: 0.03 10*3/uL (ref 0.00–0.07)
Basophils Absolute: 0.1 10*3/uL (ref 0.0–0.1)
Basophils Relative: 1 %
Eosinophils Absolute: 0.1 10*3/uL (ref 0.0–0.5)
Eosinophils Relative: 1 %
HCT: 36.8 % (ref 36.0–46.0)
Hemoglobin: 12.5 g/dL (ref 12.0–15.0)
Immature Granulocytes: 0 %
Lymphocytes Relative: 47 %
Lymphs Abs: 3.4 10*3/uL (ref 0.7–4.0)
MCH: 32.2 pg (ref 26.0–34.0)
MCHC: 34 g/dL (ref 30.0–36.0)
MCV: 94.8 fL (ref 80.0–100.0)
Monocytes Absolute: 0.5 10*3/uL (ref 0.1–1.0)
Monocytes Relative: 8 %
Neutro Abs: 3 10*3/uL (ref 1.7–7.7)
Neutrophils Relative %: 43 %
Platelets: 232 10*3/uL (ref 150–400)
RBC: 3.88 MIL/uL (ref 3.87–5.11)
RDW: 12.5 % (ref 11.5–15.5)
WBC: 7.1 10*3/uL (ref 4.0–10.5)
nRBC: 0 % (ref 0.0–0.2)

## 2023-07-11 LAB — RESP PANEL BY RT-PCR (RSV, FLU A&B, COVID)  RVPGX2
Influenza A by PCR: NEGATIVE
Influenza B by PCR: NEGATIVE
Resp Syncytial Virus by PCR: NEGATIVE
SARS Coronavirus 2 by RT PCR: NEGATIVE

## 2023-07-11 LAB — COMPREHENSIVE METABOLIC PANEL
ALT: 29 U/L (ref 0–44)
AST: 22 U/L (ref 15–41)
Albumin: 3.6 g/dL (ref 3.5–5.0)
Alkaline Phosphatase: 55 U/L (ref 38–126)
Anion gap: 9 (ref 5–15)
BUN: 20 mg/dL (ref 6–20)
CO2: 20 mmol/L — ABNORMAL LOW (ref 22–32)
Calcium: 8.8 mg/dL — ABNORMAL LOW (ref 8.9–10.3)
Chloride: 110 mmol/L (ref 98–111)
Creatinine, Ser: 0.7 mg/dL (ref 0.44–1.00)
GFR, Estimated: >60 mL/min (ref >60)
Glucose, Bld: 281 mg/dL — ABNORMAL HIGH (ref 70–99)
Potassium: 4.2 mmol/L (ref 3.5–5.1)
Sodium: 139 mmol/L (ref 135–145)
Total Bilirubin: 0.5 mg/dL (ref 0.0–1.2)
Total Protein: 7.2 g/dL (ref 6.5–8.1)

## 2023-07-11 LAB — GROUP A STREP BY PCR: Group A Strep by PCR: NOT DETECTED

## 2023-07-11 LAB — PREGNANCY, URINE: Preg Test, Ur: NEGATIVE

## 2023-07-11 MED ORDER — BENZTROPINE MESYLATE 1 MG PO TABS: 1.0000 mg | ORAL_TABLET | Freq: Two times a day (BID) | ORAL | 0 refills | Status: DC

## 2023-07-11 MED ORDER — DIVALPROEX SODIUM ER 250 MG PO TB24: 250.0000 mg | ORAL_TABLET | Freq: Two times a day (BID) | ORAL | 0 refills | Status: DC

## 2023-07-11 MED ORDER — ARIPIPRAZOLE ER 400 MG IM SRER: 300.0000 mg | INTRAMUSCULAR | 0 refills | Status: DC

## 2023-07-11 MED ORDER — ARIPIPRAZOLE 2 MG PO TABS: 2.0000 mg | ORAL_TABLET | Freq: Every day | ORAL | 0 refills | Status: DC

## 2023-07-11 MED ORDER — ONDANSETRON 4 MG PO TBDP
4.0000 mg | ORAL_TABLET | Freq: Once | ORAL | Status: AC
  Administered 2023-07-11: 4 mg via ORAL
  Filled 2023-07-11: qty 1

## 2023-07-11 MED ORDER — MELATONIN 5 MG PO TABS: 2.5000 mg | ORAL_TABLET | Freq: Every day | ORAL | 0 refills | Status: DC

## 2023-07-11 NOTE — Group Note (Signed)
 Recreation Therapy Group Note   Group Topic:Relaxation  Group Date: 07/11/2023 Start Time: 1000 End Time: 1050 Facilitators: Celestia Jeoffrey BRAVO, LRT, CTRS Location:  Craft Room  Group Description: PMR (Progressive Muscle Relaxation). LRT asks patients their current level of stress/anxiety from 1-10, with 10 being the highest. LRT educates patients on what PMR is and the benefits that come from it. Patients are asked to sit with their feet flat on the floor while sitting up and all the way back in their chair, if possible. LRT and pts follow a prompt through a speaker that requires you to tense and release different muscles in their body and focus on their breathing. During session, lights are off and soft music is being played. Pts are given a stress ball to use if needed. At the end of the prompt, LRT asks patients to rank their current levels of stress/anxiety from 1-10, 10 being the highest. LRT provides patients with an education handout on PMR.   Goal Area(s) Addressed:  Patients will be able to describe progressive muscle relaxation.  Patient will practice using relaxation technique. Patient will identify a new coping skill.  Patient will follow multistep directions to reduce anxiety and stress.   Affect/Mood: N/A   Participation Level: Did not attend    Clinical Observations/Individualized Feedback: Patient did not attend group.   Plan: Continue to engage patient in RT group sessions 2-3x/week.   Jeoffrey BRAVO Celestia, LRT, CTRS 07/11/2023 11:37 AM

## 2023-07-11 NOTE — ED Triage Notes (Signed)
 Pt ot ED via EMS from group home, pt reports she was just released from the psych hospital here today. Pt reports she has had throat pain x3 days and today had 2 episodes of emesis. Pt reports generalized abd discomfort.

## 2023-07-11 NOTE — ED Triage Notes (Addendum)
 EMS brings pt from unknown group home at [address] [place] for c/o N/V; was d/c earlier today from [place] med unit;

## 2023-07-11 NOTE — Progress Notes (Signed)
  [facility] Adult Case Management Discharge Plan :  Will you be returning to the same living situation after discharge:  Yes,  Patient to return to [facility] group home.  At discharge, do you have transportation home?: Yes,  Patient's guardian to provide transportation.  Do you have the ability to pay for your medications: Yes, [employer] / [employer]   Release of information consent forms completed and in the chart;  Patient's signature needed at discharge.  Patient to Follow up at:  Follow-up Information     [facility] Follow up.   Contact information: [address] [place] [address] [phone]                 Next level of care provider has access to [facility] Link:no  Safety Planning and Suicide Prevention discussed: Yes,  SPE conducted with patient's guardian with patient's consent.      Has patient been referred to the Quitline?: Patient does not use tobacco/nicotine  products  Patient has been referred for addiction treatment: No known substance use disorder.  Crystal [name], LCSW 07/11/2023, 9:36 AM

## 2023-07-11 NOTE — Group Note (Signed)
 Date:  07/11/2023 Time:  12:47 AM  Group Topic/Focus:  Wrap-Up Group:   The focus of this group is to help patients review their daily goal of treatment and discuss progress on daily workbooks.    Participation Level:  Did Not Attend  Participation Quality:      Affect:      Cognitive:      Insight: None  Engagement in Group:  None  Modes of Intervention:      Additional Comments:    Tommas CHRISTELLA Bunker 07/11/2023, 12:47 AM

## 2023-07-11 NOTE — Progress Notes (Signed)
   07/11/23 1031  Psych Admission Type (Psych Patients Only)  Admission Status Voluntary  Psychosocial Assessment  Patient Complaints None  Eye Contact Brief  Facial Expression Flat  Affect Depressed  Speech Slow  Interaction Needy;Assertive  Motor Activity Slow  Appearance/Hygiene In scrubs  Behavior Characteristics Calm;Cooperative  Mood Pleasant  Thought Process  Coherency Disorganized  Content Preoccupation  Delusions None reported or observed  Perception WDL  Hallucination None reported or observed  Judgment Limited  Confusion Mild  Danger to Self  Current suicidal ideation? Denies  Self-Injurious Behavior No self-injurious ideation or behavior indicators observed or expressed   Agreement Not to Harm Self Yes  Description of Agreement verbal  Danger to Others  Danger to Others None reported or observed   Patient discharged at this time with all discharge instruction given with acknowledgment. All belongings given to patient. Picked up by group home interior and spatial designer.

## 2023-07-11 NOTE — Group Note (Signed)
 Date:  07/11/2023 Time:  10:11 AM  Group Topic/Focus:  Goals Group:   The focus of this group is to help patients establish daily goals to achieve during treatment and discuss how the patient can incorporate goal setting into their daily lives to aide in recovery.    Participation Level:  Active  Participation Quality:  Appropriate  Affect:  Appropriate  Cognitive:  Appropriate  Insight: Appropriate  Engagement in Group:  Engaged  Modes of Intervention:  Discussion, Education, and Support  Additional Comments:    Deitra Caron Mainland 07/11/2023, 10:11 AM

## 2023-07-11 NOTE — ED Provider Notes (Signed)
 [facility] Provider Note    Event Date/Time   First MD Initiated Contact with Patient 07/11/23 2320     (approximate)   History   Emesis   HPI  Crystal Black is a 28 y.o. female   Past medical history of HIV (last cd4 ct 1100 in Nov '24), depression, PTSD, anxiety, ADHD and asthma who presents to the Emergency Department with chief complaint of nausea vomiting and sore throat.  Has also had loose stools.  No GI bleeding.  Was just discharged earlier today from psychiatric hospital  She feels that the abdominal discomfort and nausea and vomiting are worse after eating.  Has had a mild cough as well.  No urinary symptoms.  She is a daily smoker and would like nicotine  replacement to try to stop smoking.   External Medical Documents Reviewed: Discharge summary from psychiatry earlier today.      Physical Exam   Triage Vital Signs: ED Triage Vitals  Encounter Vitals Group     BP 07/11/23 2221 116/84     Systolic BP Percentile --      Diastolic BP Percentile --      Pulse Rate 07/11/23 2221 100     Resp 07/11/23 2221 16     Temp 07/11/23 2221 97.6 F (36.4 C)     Temp Source 07/11/23 2221 Oral     SpO2 07/11/23 2221 97 %     Weight 07/11/23 2220 179 lb (81.2 kg)     Height 07/11/23 2220 5' 5 (1.651 m)     Head Circumference --      Peak Flow --      Pain Score 07/11/23 2220 8     Pain Loc --      Pain Education --      Exclude from Growth Chart --     Most recent vital signs: Vitals:   07/11/23 2221  BP: 116/84  Pulse: 100  Resp: 16  Temp: 97.6 F (36.4 C)  SpO2: 97%    General: Awake, no distress.  CV:  Good peripheral perfusion.  Resp:  Normal effort.  Abd:  No distention.  Other:  Tenderness to the right upper quadrant.  No rigidity or guarding.  No fever.  Nontoxic appearance.  Oropharynx appears normal without any lesions exudates or masses and uvula is midline she is maintaining secretions neck supple full range of  motion.   ED Results / Procedures / Treatments   Labs (all labs ordered are listed, but only abnormal results are displayed) Labs Reviewed  COMPREHENSIVE METABOLIC PANEL - Abnormal; Notable for the following components:      Result Value   CO2 20 (*)    Glucose, Bld 281 (*)    Calcium 8.8 (*)    All other components within normal limits  URINALYSIS, ROUTINE W REFLEX MICROSCOPIC - Abnormal; Notable for the following components:   Color, Urine YELLOW (*)    APPearance CLOUDY (*)    Glucose, UA 50 (*)    All other components within normal limits  RESP PANEL BY RT-PCR (RSV, FLU A&B, COVID)  [id number]  GROUP A STREP BY PCR  CBC WITH DIFFERENTIAL/PLATELET  PREGNANCY, URINE  LIPASE, BLOOD     I ordered and reviewed the above labs they are notable for she is hyperglycemic but normal anion gap.  LFTs are normal.  Negative viral panel and group A strep.    RADIOLOGY I independently reviewed and interpreted ultrasound of the right upper  quadrant see no obvious wall thickening gallstones or CBD dilation I also reviewed radiologist's formal read.   PROCEDURES:  Critical Care performed: No  Procedures   MEDICATIONS ORDERED IN ED: Medications  ondansetron  (ZOFRAN -ODT) disintegrating tablet 4 mg (4 mg Oral Given 07/11/23 2350)     IMPRESSION / MDM / ASSESSMENT AND PLAN / ED COURSE  I reviewed the triage vital signs and the nursing notes.                                Patient's presentation is most consistent with acute presentation with potential threat to life or bodily function.  Differential diagnosis includes, but is not limited to, biliary colic, gallstones, cholecystitis, pancreatitis, gastritis, gastroenteritis considered but less likely appendicitis, urinary tract infection, obstruction deep space neck infection or abscess   The patient is on the cardiac monitor to evaluate for evidence of arrhythmia and/or significant heart rate changes.  MDM:    Nausea vomiting  diarrhea this patient with right upper quadrant tenderness may reflect viral gastroenteritis or biliary pathologies.  Give antiemetic.  Check right upper quadrant ultrasound.  No evidence of deep space neck infection or abscess on examination, sore throat may be viral illness pharyngitis or exacerbated by her vomiting.   Fortunately workup unremarkable and right upper quadrant ultrasound shows no acute abnormalities with normal-appearing CBD no evidence of gallstones or cholecystitis.  Patient stable.  Likely gastroenteritis.  Discharge with follow-up with PMD, Zofran  prescription.  -- I spent 5 minutes counseling this patient on smoking cessation.  We spoke about the patient's current tobacco use, impact of smoking, assessed willingness to quit, methods for cessation including medical management and nicotine  replacement therapy (which I prescribed to the patient) and advised follow-up with primary doctor to continue to address smoking cessation.      FINAL CLINICAL IMPRESSION(S) / ED DIAGNOSES   Final diagnoses:  Nausea vomiting and diarrhea  Sore throat  RUQ pain     Rx / DC Orders   ED Discharge Orders     None        Note:  This document was prepared using Dragon voice recognition software and may include unintentional dictation errors.    Cyrena Mylar, MD 07/12/23 [phone]
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
## 2023-07-11 NOTE — [facility] Counselor (Addendum)
 CSW spoke with patient's guardian Kaleta Marsh At [phone]. CSW confirmed that patient will be discharged today 07/11/23.   Guardian expressed understanding and was ok with discharge.   CSW spoke with group home and informed of the expected discharge. Group home expressed understanding and has agreed to provide transportation.       Tyrin Herbers, MSW, LCSWA 07/11/2023 9:29 AM

## 2023-07-11 NOTE — Inpatient Diabetes Management (Signed)
 Inpatient Diabetes Program Recommendations  AACE/ADA: New Consensus Statement on Inpatient Glycemic Control (2015)  Target Ranges:  Prepandial:   less than 140 mg/dL      Peak postprandial:   less than 180 mg/dL (1-2 hours)      Critically ill patients:  140 - 180 mg/dL    Latest Reference Range & Units 07/08/23 12:37  Hemoglobin A1C 4.8 - 5.6 % 6.2 (H)  (H): Data is abnormally high    Admit with:  Severe recurrent depression with psychosis  Suicidal ideations     Consult received for this patient Taking Abilify  and current A1c in the Pre-diabetes range Also note pt has obesity per BMI   Recommend the following: 1. Start CBG checks BID (AM prior to Breakfast and PM prior to [employer])  2. Start Metformin  500 mg daily     --Will follow patient during hospitalization--  Adina Rudolpho Arrow RN, MSN, CDCES Diabetes Coordinator Inpatient Glycemic Control Team Team Pager: [phone] (8a-5p)
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
## 2023-07-12 ENCOUNTER — Emergency Department: Payer: MEDICAID

## 2023-07-12 LAB — LIPASE, BLOOD: Lipase: 37 U/L (ref 11–51)

## 2023-07-12 MED ORDER — NICOTINE 7 MG/24HR TD PT24: 7.0000 mg | MEDICATED_PATCH | Freq: Every day | TRANSDERMAL | 0 refills | Status: DC

## 2023-07-12 MED ORDER — NICOTINE POLACRILEX 4 MG MT LOZG: 4.0000 mg | LOZENGE | OROMUCOSAL | 0 refills | Status: DC | PRN

## 2023-07-12 MED ORDER — ONDANSETRON 4 MG PO TBDP: 4.0000 mg | ORAL_TABLET | Freq: Three times a day (TID) | ORAL | 0 refills | Status: DC | PRN

## 2023-07-12 NOTE — ED Notes (Addendum)
 0344am, pt assisted into uber called by caregiver to transport back to group home;called and notified Arnell Bevels pt is enroute

## 2023-07-12 NOTE — Discharge Instructions (Addendum)
 Take acetaminophen  650 mg and ibuprofen  400 mg every 6 hours for pain.  Take with food. Take zofran  for nausea.   Drink plenty of fluids to stay well-hydrated.  Find Pedialyte or similar electrolyte rehydration formulas at your local pharmacy.   Thank you for choosing us  for your health care today!  Please see your primary doctor this week for a follow up appointment.   If you have any new, worsening, or unexpected symptoms call your doctor right away or come back to the emergency department for reevaluation.  It was my pleasure to care for you today.   Arron Large Margery Sheets, MD

## 2023-07-12 NOTE — ED Notes (Addendum)
 0315am called and spoke with Arnell Bevels, caregiver at [facility] and informed of pt's d/c; st will call uber to come p/u pt ([employer] [facility]), to arrive in ; pt currently sitting in lobby awaiting transport

## 2023-07-17 ENCOUNTER — Emergency Department
Admission: AD | Admit: 2023-07-17 | Discharge: 2023-07-18 | Disposition: A | Discharge status: Other Acute Inpatient Hospital | Payer: MEDICAID | Source: Intra-hospital | Attending: Emergency Medicine | Admitting: Emergency Medicine

## 2023-07-17 ENCOUNTER — Other Ambulatory Visit: Payer: Self-pay

## 2023-07-17 DIAGNOSIS — J45909 Unspecified asthma, uncomplicated: Secondary | ICD-10-CM | POA: Insufficient documentation

## 2023-07-17 DIAGNOSIS — F4310 Post-traumatic stress disorder, unspecified: Secondary | ICD-10-CM | POA: Diagnosis present

## 2023-07-17 DIAGNOSIS — F603 Borderline personality disorder: Secondary | ICD-10-CM | POA: Insufficient documentation

## 2023-07-17 DIAGNOSIS — Z7951 Long term (current) use of inhaled steroids: Secondary | ICD-10-CM | POA: Insufficient documentation

## 2023-07-17 DIAGNOSIS — F319 Bipolar disorder, unspecified: Secondary | ICD-10-CM | POA: Insufficient documentation

## 2023-07-17 DIAGNOSIS — Z91010 Allergy to peanuts: Secondary | ICD-10-CM | POA: Insufficient documentation

## 2023-07-17 DIAGNOSIS — F17210 Nicotine dependence, cigarettes, uncomplicated: Secondary | ICD-10-CM | POA: Insufficient documentation

## 2023-07-17 DIAGNOSIS — Y900 Blood alcohol level of less than 20 mg/100 ml: Secondary | ICD-10-CM | POA: Insufficient documentation

## 2023-07-17 DIAGNOSIS — R45851 Suicidal ideations: Secondary | ICD-10-CM

## 2023-07-17 DIAGNOSIS — Z21 Asymptomatic human immunodeficiency virus [HIV] infection status: Secondary | ICD-10-CM | POA: Insufficient documentation

## 2023-07-17 DIAGNOSIS — F3160 Bipolar disorder, current episode mixed, unspecified: Secondary | ICD-10-CM | POA: Diagnosis present

## 2023-07-17 LAB — COMPREHENSIVE METABOLIC PANEL
ALT: 26 U/L (ref 0–44)
AST: 22 U/L (ref 15–41)
Albumin: 4.1 g/dL (ref 3.5–5.0)
Alkaline Phosphatase: 42 U/L (ref 38–126)
Anion gap: 12 (ref 5–15)
BUN: 16 mg/dL (ref 6–20)
CO2: 20 mmol/L — ABNORMAL LOW (ref 22–32)
Calcium: 8.8 mg/dL — ABNORMAL LOW (ref 8.9–10.3)
Chloride: 103 mmol/L (ref 98–111)
Creatinine, Ser: 0.98 mg/dL (ref 0.44–1.00)
GFR, Estimated: >60 mL/min (ref >60)
Glucose, Bld: 121 mg/dL — ABNORMAL HIGH (ref 70–99)
Potassium: 3.8 mmol/L (ref 3.5–5.1)
Sodium: 135 mmol/L (ref 135–145)
Total Bilirubin: 0.6 mg/dL (ref 0.0–1.2)
Total Protein: 7.9 g/dL (ref 6.5–8.1)

## 2023-07-17 LAB — POC URINE PREG, ED: Preg Test, Ur: NEGATIVE

## 2023-07-17 LAB — CBC
HCT: 37.1 % (ref 36.0–46.0)
Hemoglobin: 12.6 g/dL (ref 12.0–15.0)
MCH: 32.5 pg (ref 26.0–34.0)
MCHC: 34 g/dL (ref 30.0–36.0)
MCV: 95.6 fL (ref 80.0–100.0)
Platelets: 237 10*3/uL (ref 150–400)
RBC: 3.88 MIL/uL (ref 3.87–5.11)
RDW: 12.9 % (ref 11.5–15.5)
WBC: 10.5 10*3/uL (ref 4.0–10.5)
nRBC: 0 % (ref 0.0–0.2)

## 2023-07-17 LAB — SALICYLATE LEVEL: Salicylate Lvl: <7 mg/dL — ABNORMAL LOW (ref 7.0–30.0)

## 2023-07-17 LAB — ACETAMINOPHEN LEVEL: Acetaminophen (Tylenol), Serum: <10 ug/mL — ABNORMAL LOW (ref 10–30)

## 2023-07-17 LAB — URINE DRUG SCREEN, QUALITATIVE (ARMC ONLY)
Amphetamines, Ur Screen: NOT DETECTED
Barbiturates, Ur Screen: NOT DETECTED
Benzodiazepine, Ur Scrn: NOT DETECTED
Cannabinoid 50 Ng, Ur ~~LOC~~: NOT DETECTED
Cocaine Metabolite,Ur ~~LOC~~: NOT DETECTED
MDMA (Ecstasy)Ur Screen: NOT DETECTED
Methadone Scn, Ur: NOT DETECTED
Opiate, Ur Screen: NOT DETECTED
Phencyclidine (PCP) Ur S: NOT DETECTED
Tricyclic, Ur Screen: NOT DETECTED

## 2023-07-17 LAB — ETHANOL: Alcohol, Ethyl (B): <10 mg/dL (ref <10)
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
## 2023-07-17 NOTE — ED Notes (Signed)
Pt belongings: Black shoes Red bra Black bonnet Blue pants Orange socks Brown/white bag Black jacket Black panties

## 2023-07-17 NOTE — ED Notes (Signed)
Vol moved to bhu 5

## 2023-07-17 NOTE — ED Provider Notes (Signed)
[facility] Provider Note    Event Date/Time   First MD Initiated Contact with Patient 07/17/23 2221     (approximate)   History   Psychiatric Evaluation   HPI Crystal Black is a 28 y.o. female with a history of PTSD, borderline personality disorder, bipolar 1 with depression presenting today for suicidal ideation.  Patient states that she had a bad dream and woke up from it with flashbacks and having PTSD.  She stated since then she has had suicidal ideation with thoughts of wanting to go to a bridge and jump off of it.  Has also noted homicidal ideation and wanting to hurt someone near her.  States the symptoms are still present.  Denies any other acute symptoms at this time.     Physical Exam   Triage Vital Signs: ED Triage Vitals  Encounter Vitals Group     BP 07/17/23 2148 131/79     Systolic BP Percentile --      Diastolic BP Percentile --      Pulse Rate 07/17/23 2148 (!) 110     Resp 07/17/23 2148 18     Temp 07/17/23 2148 98.2 F (36.8 C)     Temp Source 07/17/23 2148 Oral     SpO2 07/17/23 2148 98 %     Weight 07/17/23 2152 178 lb 15.9 oz (81.2 kg)     Height 07/17/23 2152 5\' 5"  (1.651 m)     Head Circumference --      Peak Flow --      Pain Score 07/17/23 2152 0     Pain Loc --      Pain Education --      Exclude from Growth Chart --     Most recent vital signs: Vitals:   07/17/23 2148  BP: 131/79  Pulse: (!) 110  Resp: 18  Temp: 98.2 F (36.8 C)  SpO2: 98%   I have reviewed the vital signs. General:  Awake, alert, no acute distress. Head:  Normocephalic, Atraumatic. EENT:  PERRL, EOMI, Oral mucosa pink and moist, Neck is supple. Cardiovascular: Regular rate, 2+ distal pulses. Respiratory:  Normal respiratory effort, symmetrical expansion, no distress.   Extremities:  Moving all four extremities through full ROM without pain.   Neuro:  Alert and oriented.  Interacting appropriately.   Skin:  Warm, dry, no rash.   Psych:  Appropriate affect.    ED Results / Procedures / Treatments   Labs (all labs ordered are listed, but only abnormal results are displayed) Labs Reviewed  COMPREHENSIVE METABOLIC PANEL - Abnormal; Notable for the following components:      Result Value   CO2 20 (*)    Glucose, Bld 121 (*)    Calcium 8.8 (*)    All other components within normal limits  SALICYLATE LEVEL - Abnormal; Notable for the following components:   Salicylate Lvl <7.0 (*)    All other components within normal limits  ACETAMINOPHEN LEVEL - Abnormal; Notable for the following components:   Acetaminophen (Tylenol), Serum <10 (*)    All other components within normal limits  ETHANOL  CBC  URINE DRUG SCREEN, QUALITATIVE ([facility] ONLY)  POC URINE PREG, ED     EKG    RADIOLOGY    PROCEDURES:  Critical Care performed: No  Procedures   MEDICATIONS ORDERED IN ED: Medications - No data to display   IMPRESSION / MDM / ASSESSMENT AND PLAN / ED COURSE  I reviewed the triage vital  signs and the nursing notes.                              Differential diagnosis includes, but is not limited to, suicidal ideation, worsening depression  Patient's presentation is most consistent with acute presentation with potential threat to life or bodily function.  Patient is a 28 year old female presenting today for suicidal and homicidal ideation following PTSD flashbacks.  Still endorsing symptoms at this time.  No acute medical complaints and medically cleared.  Laboratory workup reassuring.  Medically cleared and psychiatry was consulted for further evaluation.  Patient was signed out to oncoming provider pending completion of psychiatric assessment.  The patient has been placed in psychiatric observation due to the need to provide a safe environment for the patient while obtaining psychiatric consultation and evaluation, as well as ongoing medical and medication management to treat the patient's condition.  The patient  has not been placed under full IVC at this time.      FINAL CLINICAL IMPRESSION(S) / ED DIAGNOSES   Final diagnoses:  Suicidal ideation     Rx / DC Orders   ED Discharge Orders     None        Note:  This document was prepared using Dragon voice recognition software and may include unintentional dictation errors.   Janith Lima, MD 07/17/23 2250

## 2023-07-17 NOTE — ED Notes (Signed)
Vol /psych consult ordered/ pending  

## 2023-07-17 NOTE — ED Triage Notes (Signed)
Pt reports having a bad dream tonight and woke up feeling anxious after the dream, pt states this made her want to go jump off a bridge. Pt calm and cooperative.
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
## 2023-07-17 NOTE — ED Notes (Signed)
Pt brought back to room from lobby. Given water and blankets.   Pt states she was having flashbacks in her sleep that made her anxious and wanting to jump off a bridge. She called 911 to bring her here so she could get help.

## 2023-07-18 ENCOUNTER — Encounter: Payer: Self-pay | Admitting: Psychiatry

## 2023-07-18 ENCOUNTER — Inpatient Hospital Stay
Admission: AD | Admit: 2023-07-18 | Discharge: 2023-09-08 | DRG: 885 | Disposition: A | Discharge status: Home / Self Care | Payer: MEDICAID | Source: Intra-hospital | Attending: Psychiatry | Admitting: Psychiatry

## 2023-07-18 DIAGNOSIS — F331 Major depressive disorder, recurrent, moderate: Secondary | ICD-10-CM | POA: Diagnosis not present

## 2023-07-18 DIAGNOSIS — K625 Hemorrhage of anus and rectum: Secondary | ICD-10-CM | POA: Diagnosis present

## 2023-07-18 DIAGNOSIS — N390 Urinary tract infection, site not specified: Secondary | ICD-10-CM | POA: Diagnosis not present

## 2023-07-18 DIAGNOSIS — F909 Attention-deficit hyperactivity disorder, unspecified type: Secondary | ICD-10-CM | POA: Diagnosis present

## 2023-07-18 DIAGNOSIS — F4310 Post-traumatic stress disorder, unspecified: Secondary | ICD-10-CM | POA: Diagnosis present

## 2023-07-18 DIAGNOSIS — B20 Human immunodeficiency virus [HIV] disease: Secondary | ICD-10-CM | POA: Diagnosis present

## 2023-07-18 DIAGNOSIS — F329 Major depressive disorder, single episode, unspecified: Secondary | ICD-10-CM | POA: Diagnosis present

## 2023-07-18 DIAGNOSIS — K760 Fatty (change of) liver, not elsewhere classified: Secondary | ICD-10-CM | POA: Diagnosis present

## 2023-07-18 DIAGNOSIS — E1165 Type 2 diabetes mellitus with hyperglycemia: Secondary | ICD-10-CM | POA: Diagnosis present

## 2023-07-18 DIAGNOSIS — Z813 Family history of other psychoactive substance abuse and dependence: Secondary | ICD-10-CM

## 2023-07-18 DIAGNOSIS — G479 Sleep disorder, unspecified: Secondary | ICD-10-CM | POA: Diagnosis present

## 2023-07-18 DIAGNOSIS — F603 Borderline personality disorder: Secondary | ICD-10-CM | POA: Diagnosis present

## 2023-07-18 DIAGNOSIS — Z716 Tobacco abuse counseling: Secondary | ICD-10-CM

## 2023-07-18 DIAGNOSIS — Z9152 Personal history of nonsuicidal self-harm: Secondary | ICD-10-CM | POA: Diagnosis not present

## 2023-07-18 DIAGNOSIS — Z79899 Other long term (current) drug therapy: Secondary | ICD-10-CM

## 2023-07-18 DIAGNOSIS — R45851 Suicidal ideations: Secondary | ICD-10-CM | POA: Diagnosis present

## 2023-07-18 DIAGNOSIS — B009 Herpesviral infection, unspecified: Secondary | ICD-10-CM | POA: Diagnosis present

## 2023-07-18 DIAGNOSIS — F3160 Bipolar disorder, current episode mixed, unspecified: Secondary | ICD-10-CM | POA: Diagnosis not present

## 2023-07-18 DIAGNOSIS — Z9151 Personal history of suicidal behavior: Secondary | ICD-10-CM

## 2023-07-18 DIAGNOSIS — Z91410 Personal history of adult physical and sexual abuse: Secondary | ICD-10-CM

## 2023-07-18 DIAGNOSIS — F71 Moderate intellectual disabilities: Secondary | ICD-10-CM | POA: Diagnosis present

## 2023-07-18 DIAGNOSIS — F1210 Cannabis abuse, uncomplicated: Secondary | ICD-10-CM | POA: Diagnosis present

## 2023-07-18 DIAGNOSIS — F17290 Nicotine dependence, other tobacco product, uncomplicated: Secondary | ICD-10-CM | POA: Diagnosis present

## 2023-07-18 DIAGNOSIS — Z6833 Body mass index (BMI) 33.0-33.9, adult: Secondary | ICD-10-CM

## 2023-07-18 DIAGNOSIS — R45850 Homicidal ideations: Secondary | ICD-10-CM | POA: Diagnosis present

## 2023-07-18 DIAGNOSIS — F419 Anxiety disorder, unspecified: Secondary | ICD-10-CM | POA: Diagnosis present

## 2023-07-18 DIAGNOSIS — R109 Unspecified abdominal pain: Secondary | ICD-10-CM | POA: Diagnosis present

## 2023-07-18 DIAGNOSIS — R1084 Generalized abdominal pain: Secondary | ICD-10-CM | POA: Diagnosis not present

## 2023-07-18 DIAGNOSIS — F17210 Nicotine dependence, cigarettes, uncomplicated: Secondary | ICD-10-CM | POA: Diagnosis present

## 2023-07-18 DIAGNOSIS — K645 Perianal venous thrombosis: Secondary | ICD-10-CM | POA: Diagnosis not present

## 2023-07-18 DIAGNOSIS — Z91013 Allergy to seafood: Secondary | ICD-10-CM

## 2023-07-18 MED ORDER — DIPHENHYDRAMINE HCL 50 MG/ML IJ SOLN
50.0000 mg | Freq: Three times a day (TID) | INTRAMUSCULAR | Status: DC | PRN
  Administered 2023-08-04: 50 mg via INTRAMUSCULAR
  Filled 2023-07-18: qty 1

## 2023-07-18 MED ORDER — HALOPERIDOL 5 MG PO TABS
5.0000 mg | ORAL_TABLET | Freq: Three times a day (TID) | ORAL | Status: DC | PRN
  Administered 2023-07-21 – 2023-08-14 (×7): 5 mg via ORAL
  Filled 2023-07-18 (×2): qty 1

## 2023-07-18 MED ORDER — INFLUENZA VIRUS VACC SPLIT PF (FLUZONE) 0.5 ML IM SUSY
0.5000 mL | PREFILLED_SYRINGE | INTRAMUSCULAR | Status: DC
  Filled 2023-07-18: qty 0.5

## 2023-07-18 MED ORDER — DIPHENHYDRAMINE HCL 50 MG/ML IJ SOLN: 50.0000 mg | Freq: Three times a day (TID) | INTRAMUSCULAR | Status: DC | PRN

## 2023-07-18 MED ORDER — ACETAMINOPHEN 325 MG PO TABS: 650.0000 mg | ORAL_TABLET | Freq: Four times a day (QID) | ORAL | Status: DC | PRN

## 2023-07-18 MED ORDER — LORAZEPAM 2 MG/ML IJ SOLN
2.0000 mg | Freq: Three times a day (TID) | INTRAMUSCULAR | Status: DC | PRN
  Filled 2023-07-18 (×2): qty 1

## 2023-07-18 MED ORDER — DIPHENHYDRAMINE HCL 25 MG PO CAPS
50.0000 mg | ORAL_CAPSULE | Freq: Three times a day (TID) | ORAL | Status: DC | PRN
  Administered 2023-09-05: 50 mg via ORAL
  Filled 2023-07-18 (×3): qty 2

## 2023-07-18 MED ORDER — HALOPERIDOL LACTATE 5 MG/ML IJ SOLN: 5.0000 mg | Freq: Three times a day (TID) | INTRAMUSCULAR | Status: DC | PRN

## 2023-07-18 MED ORDER — HALOPERIDOL LACTATE 5 MG/ML IJ SOLN
5.0000 mg | Freq: Three times a day (TID) | INTRAMUSCULAR | Status: DC | PRN
  Filled 2023-07-18: qty 1

## 2023-07-18 MED ORDER — DIPHENHYDRAMINE HCL 25 MG PO CAPS
50.0000 mg | ORAL_CAPSULE | Freq: Three times a day (TID) | ORAL | Status: DC | PRN
  Administered 2023-07-21 – 2023-08-14 (×8): 50 mg via ORAL
  Filled 2023-07-18 (×7): qty 2

## 2023-07-18 MED ORDER — ALUM & MAG HYDROXIDE-SIMETH 200-200-20 MG/5ML PO SUSP
30.0000 mL | ORAL | Status: DC | PRN
  Administered 2023-07-23 – 2023-08-02 (×4): 30 mL via ORAL
  Filled 2023-07-18 (×4): qty 30

## 2023-07-18 MED ORDER — DIPHENHYDRAMINE HCL 50 MG/ML IJ SOLN
50.0000 mg | Freq: Three times a day (TID) | INTRAMUSCULAR | Status: DC | PRN
  Administered 2023-07-25 – 2023-08-17 (×3): 50 mg via INTRAMUSCULAR
  Filled 2023-07-18 (×3): qty 1

## 2023-07-18 MED ORDER — LORAZEPAM 2 MG/ML IJ SOLN
2.0000 mg | Freq: Three times a day (TID) | INTRAMUSCULAR | Status: DC | PRN
  Administered 2023-08-04: 2 mg via INTRAMUSCULAR

## 2023-07-18 MED ORDER — HYDROCORTISONE (PERIANAL) 2.5 % EX CREA
TOPICAL_CREAM | Freq: Two times a day (BID) | CUTANEOUS | Status: DC
  Administered 2023-07-19 – 2023-08-27 (×5): 1 via RECTAL
  Filled 2023-07-18: qty 28.35

## 2023-07-18 MED ORDER — NICOTINE 14 MG/24HR TD PT24
14.0000 mg | MEDICATED_PATCH | Freq: Every day | TRANSDERMAL | Status: DC
  Administered 2023-07-24: 14 mg via TRANSDERMAL
  Filled 2023-07-18 (×10): qty 1

## 2023-07-18 MED ORDER — TRAZODONE HCL 50 MG PO TABS
50.0000 mg | ORAL_TABLET | Freq: Every evening | ORAL | Status: DC | PRN
  Administered 2023-07-18 – 2023-08-29 (×29): 50 mg via ORAL
  Filled 2023-07-18 (×34): qty 1

## 2023-07-18 MED ORDER — LORAZEPAM 2 MG/ML IJ SOLN
2.0000 mg | Freq: Three times a day (TID) | INTRAMUSCULAR | Status: DC | PRN
  Administered 2023-07-25 – 2023-08-17 (×3): 2 mg via INTRAMUSCULAR

## 2023-07-18 MED ORDER — HALOPERIDOL LACTATE 5 MG/ML IJ SOLN
10.0000 mg | Freq: Three times a day (TID) | INTRAMUSCULAR | Status: DC | PRN
  Administered 2023-07-25 – 2023-08-17 (×4): 10 mg via INTRAMUSCULAR
  Filled 2023-07-18 (×3): qty 2

## 2023-07-18 MED ORDER — HALOPERIDOL 5 MG PO TABS
5.0000 mg | ORAL_TABLET | Freq: Three times a day (TID) | ORAL | Status: DC | PRN
  Filled 2023-07-18 (×5): qty 1

## 2023-07-18 MED ORDER — HYDROXYZINE HCL 25 MG PO TABS
25.0000 mg | ORAL_TABLET | Freq: Three times a day (TID) | ORAL | Status: DC | PRN
  Administered 2023-07-19 (×2): 25 mg via ORAL
  Filled 2023-07-18 (×2): qty 1

## 2023-07-18 MED ORDER — ENSURE ENLIVE PO LIQD
1.0000 | Freq: Three times a day (TID) | ORAL | Status: DC
  Administered 2023-07-18 – 2023-07-19 (×2): 237 mL via ORAL

## 2023-07-18 MED ORDER — NICOTINE POLACRILEX 2 MG MT GUM
2.0000 mg | CHEWING_GUM | OROMUCOSAL | Status: DC | PRN
  Administered 2023-07-18 – 2023-08-19 (×69): 2 mg via ORAL
  Filled 2023-07-18 (×81): qty 1

## 2023-07-18 MED ORDER — MAGNESIUM HYDROXIDE 400 MG/5ML PO SUSP
30.0000 mL | Freq: Every day | ORAL | Status: DC | PRN
  Administered 2023-07-21 – 2023-09-06 (×15): 30 mL via ORAL
  Filled 2023-07-18 (×15): qty 30

## 2023-07-18 MED ORDER — HALOPERIDOL LACTATE 5 MG/ML IJ SOLN
10.0000 mg | Freq: Three times a day (TID) | INTRAMUSCULAR | Status: DC | PRN
  Filled 2023-07-18: qty 2
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
## 2023-07-18 NOTE — BH Assessment (Signed)
Disposition Update: Per psych NP Rosita Kea., pt is recommended for inpt psych admission.

## 2023-07-18 NOTE — Assessment & Plan Note (Addendum)
PER psychiatry.

## 2023-07-18 NOTE — Group Note (Signed)
Date:  07/18/2023 Time:  9:41 PM  Group Topic/Focus:  Making Healthy Choices:   The focus of this group is to help patients identify negative/unhealthy choices they were using prior to admission and identify positive/healthier coping strategies to replace them upon discharge. Managing Feelings:   The focus of this group is to identify what feelings patients have difficulty handling and develop a plan to handle them in a healthier way upon discharge. Orientation:   The focus of this group is to educate the patient on the purpose and policies of crisis stabilization and provide a format to answer questions about their admission.  The group details unit policies and expectations of patients while admitted. Rediscovering Joy:   The focus of this group is to explore various ways to relieve stress in a positive manner. Wrap-Up Group:   The focus of this group is to help patients review their daily goal of treatment and discuss progress on daily workbooks.    Participation Level:  Active  Participation Quality:  Appropriate and Attentive  Affect:  Appropriate and Not Congruent  Cognitive:  Alert and Lacking  Insight: Limited  Engagement in Group:  Engaged and Poor  Modes of Intervention:  Discussion, Orientation, Reality Testing, and Support  Additional Comments:   Pt says some things that do not align with her age. Pt seems to demand attention in a group setting.   Maglione,Hridaan Bouse E 07/18/2023, 9:41 PM

## 2023-07-18 NOTE — ED Notes (Signed)
Received call from McAdams,Garnetta [phone] to check on pt.  Staff informed [facility] rep that pt was accepted to inpatient, [facility] rep stated that she is available throughout the day if there are any questions.

## 2023-07-18 NOTE — Assessment & Plan Note (Addendum)
Topical Anusol to be applied to the external thrombosed hemorrhoid twice daily starting now.  Have sent a message to general surgery on-call about patient consult.

## 2023-07-18 NOTE — Assessment & Plan Note (Addendum)
Per psychiatry 

## 2023-07-18 NOTE — Group Note (Signed)
LCSW Group Therapy Note  Group Date: 07/18/2023 Start Time: 1315 End Time: 1415  Type of Therapy and Topic:  Group Therapy: Cycles of Anxiety   Participation Level:  Active   Description of Group:   In this group, patients learned how to recognize the physical, cognitive, emotional, and behavioral responses they have to anxiety-provoking situations.  They identified a recent time they became anxious and how they reacted.  Reviewed the cycle of anxiety.  They analyzed how their reaction was possibly beneficial and how it was possibly unhelpful.  The group discussed a variety of healthier coping skills that could help with such a situation in the future.  Focus was placed on how helpful it is to recognize the underlying emotions to our anxiety, because working on those can lead to a more permanent solution as well as our ability to focus on the important rather than the urgent.  Therapeutic Goals: Patients will remember their last incident of anxiety and how they felt emotionally and physically, what their thoughts were at the time, and how they behaved. Patients will identify how their behavior at that time worked for them, as well as how it worked against them. Patients will explore possible new behaviors to use in future anxious situations. Patients will learn that anxiety itself is normal and cannot be eliminated, and that healthier reactions can assist with resolving conflict rather than worsening situations.  Summary of Patient Progress:   Patient was active during the group. She shared a recent occurrence wherein feeling pf anxiety led to negative consequences.  She demonstrated fair insight into the subject matter, was respectful of peers, and participated throughout the entire session.  She was able to engage in discussion on coping skills.  She engaged in discussion on cycle of anxiety.    Therapeutic Modalities:   Cognitive Behavioral Therapy    Harden Mo,  LCSW 07/18/2023  3:09 PM

## 2023-07-18 NOTE — ED Notes (Signed)
Pt was accepted to [facility]  07/18/2023 ; Bed Assignment 311 PENDING Signed voluntary consent by Legal Guardian faxed to [facility] FAX Number [phone] Address: [address] [place], [place], [place] [address] Pt meets inpatient criteria per NP Rosita Kea  Attending Physician will be Dr. Shellee Milo Report can be called to: [phone] Pt can arrive after: [facility] will coordinate with care team.
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
## 2023-07-18 NOTE — Progress Notes (Signed)
   07/18/23 1545  Spiritual Encounters  Type of Visit Initial  Care provided to: Patient  Conversation partners present during encounter Nurse  Referral source Patient request  Reason for visit Trauma  Spiritual Framework  Presenting Themes Impactful experiences and emotions  Patient Stress Factors Other (Comment) (Housing)   Chaplain provided active listening and empathy regarding trauma patient experienced.  Patient asked Chaplain to pray privately.    Rev. Rana M. Earlene Plater, MDiv.  Chaplain Resident  [facility]

## 2023-07-18 NOTE — ED Notes (Signed)
Transferred to [facility] BMU

## 2023-07-18 NOTE — Assessment & Plan Note (Addendum)
Patient states some coming here she had nausea vomiting and diarrhea and was told that she has a virus she still having abdominal pain and we will proceed with a CT of the abdomen. Lipase on arrival was within normal limits along with LFTs.

## 2023-07-18 NOTE — Group Note (Signed)
Date:  07/18/2023 Time:  7:03 PM  Group Topic/Focus:  Rediscovering Joy:   The focus of this group is to explore various ways to relieve stress in a positive manner. Music Therapy    Participation Level:  Active  Participation Quality:  Appropriate  Affect:  Appropriate  Cognitive:  Appropriate  Insight: Appropriate  Engagement in Group:  Developing/Improving  Modes of Intervention:  Activity  Additional Comments:    Murna Backer 07/18/2023, 7:03 PM

## 2023-07-18 NOTE — ED Notes (Signed)
Hospital meal provided, pt tolerated w/o complaints.  Waste discarded appropriately.  

## 2023-07-18 NOTE — Group Note (Signed)
Date:  07/18/2023 Time:  12:47 PM  Group Topic/Focus:  Goals Group:   The focus of this group is to help patients establish daily goals to achieve during treatment and discuss how the patient can incorporate goal setting into their daily lives to aide in recovery. Managing Feelings:   The focus of this group is to identify what feelings patients have difficulty handling and develop a plan to handle them in a healthier way upon discharge.    Participation Level:  Did Not Attend    Crystal Black 07/18/2023, 12:47 PM

## 2023-07-18 NOTE — Assessment & Plan Note (Addendum)
Patient is currently on tivicay and prezcobix.D/W pharmacist nathan  about pt needing her Antiretroviral meds.  We will resume her home regimen.
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
## 2023-07-18 NOTE — ED Notes (Signed)
Vol/accepted to [facility] BED 311

## 2023-07-18 NOTE — Progress Notes (Signed)
Admission Note:  48 yr Caucasian female who presents voluntary by guardian and in no acute distress for the treatment of SI and Depression. Pt appears flat and depressed  and in need of medication stabilization. Pt was calm and cooperative with admission process. Pt presents with passive SI and contracts for safety upon admission. "I'm just tired of the same old thing, I was going to the bridge and jump in, but I called the police instead.. Pt denies  current  AVH .but, reported intermittent AVH  Skin was assessed and found to be  PT searched and no contraband found, POC and unit policies explained and understanding verbalized.  Food and fluids offered, and fluids accepted. Pt had no additional questions or concerns  and she was placed on q 15 min rounds.

## 2023-07-18 NOTE — ED Notes (Signed)
Received verbal permission for inpatient psych Tx from guardian representative [facility] Guardianship services @ [phone] (24hr contact #) verbal permission witnessed by Rn x 1, PCT x 1

## 2023-07-18 NOTE — Progress Notes (Addendum)
Pt was accepted to [facility]  07/18/2023 ; Bed Assignment 311 PENDING Signed voluntary consent by Legal Guardian faxed to [facility] FAX Number [phone] Address: [address] [place], [place], [place] [address]   Pt meets inpatient criteria per NP Rosita Kea   Attending Physician will be Dr. Shellee Milo  Report can be called to: [phone]  Pt can arrive after: [facility] will coordinate with care team.  Care Team notified: Augustin Coupe, Angelina Sheriff, Raycha [place], Night [facility] Alcario Drought Wright,RN  Maryjean Ka, MSW, [facility] 07/18/2023 1:28 AM

## 2023-07-18 NOTE — Progress Notes (Signed)
Pt approached the nursing station c/o "ball on my butt and some blood back there" Provider informed and examine with Clinical research associate, she was noted with a raised redden area on bottom  and she c/o not been able to sit in the chair ans she was provided with a pillow to sit on. No further discomfort verbalizes.

## 2023-07-19 ENCOUNTER — Inpatient Hospital Stay: Payer: MEDICAID

## 2023-07-19 DIAGNOSIS — F331 Major depressive disorder, recurrent, moderate: Secondary | ICD-10-CM

## 2023-07-19 DIAGNOSIS — K645 Perianal venous thrombosis: Secondary | ICD-10-CM | POA: Diagnosis not present

## 2023-07-19 LAB — HEMOGLOBIN AND HEMATOCRIT, BLOOD
HCT: 37.9 % (ref 36.0–46.0)
Hemoglobin: 13.2 g/dL (ref 12.0–15.0)

## 2023-07-19 MED ORDER — METHOCARBAMOL 500 MG PO TABS
500.0000 mg | ORAL_TABLET | Freq: Every day | ORAL | Status: DC
  Administered 2023-07-19 – 2023-09-07 (×51): 500 mg via ORAL
  Filled 2023-07-19 (×51): qty 1

## 2023-07-19 MED ORDER — FLUOXETINE HCL 20 MG PO CAPS
40.0000 mg | ORAL_CAPSULE | Freq: Every day | ORAL | Status: DC
  Administered 2023-07-19 – 2023-08-01 (×14): 40 mg via ORAL
  Filled 2023-07-19 (×16): qty 2

## 2023-07-19 MED ORDER — ONDANSETRON HCL 4 MG PO TABS
4.0000 mg | ORAL_TABLET | Freq: Three times a day (TID) | ORAL | Status: DC | PRN
  Administered 2023-07-22 – 2023-08-27 (×10): 4 mg via ORAL
  Filled 2023-07-19 (×16): qty 1

## 2023-07-19 MED ORDER — LINACLOTIDE 145 MCG PO CAPS
290.0000 ug | ORAL_CAPSULE | Freq: Every day | ORAL | Status: DC
  Administered 2023-07-20 – 2023-09-08 (×51): 290 ug via ORAL
  Filled 2023-07-19 (×51): qty 2

## 2023-07-19 MED ORDER — MONTELUKAST SODIUM 5 MG PO CHEW
5.0000 mg | CHEWABLE_TABLET | Freq: Every day | ORAL | Status: DC
  Filled 2023-07-19: qty 1

## 2023-07-19 MED ORDER — DOLUTEGRAVIR SODIUM 50 MG PO TABS
50.0000 mg | ORAL_TABLET | Freq: Every day | ORAL | Status: DC
  Administered 2023-07-20 – 2023-09-08 (×51): 50 mg via ORAL
  Filled 2023-07-19 (×53): qty 1

## 2023-07-19 MED ORDER — GLUCERNA SHAKE PO LIQD
237.0000 mL | Freq: Three times a day (TID) | ORAL | Status: DC
  Administered 2023-07-19 – 2023-08-11 (×38): 237 mL via ORAL

## 2023-07-19 MED ORDER — DIVALPROEX SODIUM ER 250 MG PO TB24
250.0000 mg | ORAL_TABLET | Freq: Every day | ORAL | Status: DC
  Administered 2023-07-20 – 2023-07-26 (×7): 250 mg via ORAL
  Filled 2023-07-19 (×9): qty 1

## 2023-07-19 MED ORDER — MONTELUKAST SODIUM 10 MG PO TABS
5.0000 mg | ORAL_TABLET | Freq: Every day | ORAL | Status: DC
  Administered 2023-07-19 – 2023-09-07 (×50): 5 mg via ORAL
  Filled 2023-07-19 (×51): qty 0.5

## 2023-07-19 MED ORDER — MELATONIN 5 MG PO TABS
5.0000 mg | ORAL_TABLET | Freq: Every day | ORAL | Status: DC
  Administered 2023-07-19 – 2023-09-02 (×45): 5 mg via ORAL
  Filled 2023-07-19 (×45): qty 1

## 2023-07-19 MED ORDER — DOCUSATE SODIUM 100 MG PO CAPS
100.0000 mg | ORAL_CAPSULE | Freq: Every day | ORAL | Status: DC
  Administered 2023-07-19 – 2023-07-30 (×12): 100 mg via ORAL
  Filled 2023-07-19 (×12): qty 1

## 2023-07-19 MED ORDER — HYDROXYZINE HCL 50 MG PO TABS
50.0000 mg | ORAL_TABLET | Freq: Four times a day (QID) | ORAL | Status: DC | PRN
  Administered 2023-07-20 – 2023-09-04 (×23): 50 mg via ORAL
  Filled 2023-07-19 (×23): qty 1

## 2023-07-19 MED ORDER — LACTULOSE 10 GM/15ML PO SOLN
10.0000 g | Freq: Two times a day (BID) | ORAL | Status: DC
  Administered 2023-07-19 – 2023-07-22 (×7): 10 g via ORAL
  Filled 2023-07-19 (×7): qty 30

## 2023-07-19 MED ORDER — BENZTROPINE MESYLATE 1 MG PO TABS
1.0000 mg | ORAL_TABLET | Freq: Every day | ORAL | Status: DC
  Administered 2023-07-19 – 2023-07-27 (×9): 1 mg via ORAL
  Filled 2023-07-19 (×9): qty 1

## 2023-07-19 MED ORDER — DARUNAVIR-COBICISTAT 800-150 MG PO TABS
1.0000 | ORAL_TABLET | Freq: Every day | ORAL | Status: DC
  Administered 2023-07-20 – 2023-09-08 (×50): 1 via ORAL
  Filled 2023-07-19 (×53): qty 1

## 2023-07-19 MED ORDER — FAMOTIDINE 20 MG PO TABS
20.0000 mg | ORAL_TABLET | Freq: Every day | ORAL | Status: DC
  Administered 2023-07-19 – 2023-09-08 (×52): 20 mg via ORAL
  Filled 2023-07-19 (×52): qty 1

## 2023-07-19 MED ORDER — IBUPROFEN 600 MG PO TABS
600.0000 mg | ORAL_TABLET | Freq: Three times a day (TID) | ORAL | Status: DC | PRN
  Administered 2023-07-20 – 2023-08-17 (×19): 600 mg via ORAL
  Filled 2023-07-19 (×20): qty 1

## 2023-07-19 MED ORDER — IOHEXOL 300 MG/ML  SOLN
100.0000 mL | Freq: Once | INTRAMUSCULAR | Status: AC | PRN
  Administered 2023-07-19: 100 mL via INTRAVENOUS

## 2023-07-19 NOTE — Progress Notes (Addendum)
HOSPITALIST CONSULT NOTE    Crystal Black  ZOX:[id number] DOB: Nov 03, 1995 DOA: 07/18/2023 PCP: Alease Medina, MD  No chief complaint on file.   Hospital Course:  Crystal Black is 28 y.o. female with HIV, asthma, and carries multiple psychiatric diagnoses including: ADHD, anxiety, PTSD, borderline personality disorder, polysubstance abuse, bipolar disorder, MDD, who presented to [facility] with suicidal ideation.  Patient was recently discharged from inpatient psychiatry on 1/14.  She was admitted to psychiatry service and medicine consult was requested.  Patient reports nausea, vomiting, diarrhea, and rectal cyst.  She also endorses abdominal pain in the upper abdomen as well as bright red blood per rectum.  Subjective: Patient reports continued diffuse abdominal pain, also endorses some bright red blood per rectum this morning.  She is having bowel movements, no diarrhea.  She believes she may be constipated. We discussed her CT scan findings.  She is requesting that we begin checking her blood glucose as she does have a history of diabetes   Objective: Vitals:   07/18/23 1300 07/19/23 0219  BP: 107/68 120/72  Pulse: (!) 111 (!) 104  Resp: 17 20  Temp: 98.7 F (37.1 C) 97.7 F (36.5 C)  TempSrc: Oral Oral  SpO2:  99%  Weight: 86.6 kg   Height: 5\' 3"  (1.6 m)    No intake or output data in the 24 hours ending 07/19/23 0811 Filed Weights   07/18/23 1300  Weight: 86.6 kg    Examination: General exam: Appears calm and comfortable, NAD  Respiratory system: No work of breathing, symmetric chest wall expansion Cardiovascular system: S1 & S2 heard, RRR.  Gastrointestinal system: Abdomen is nondistended, soft and diffusely tender to palpation, no acute abdomen, no guarding. Neuro: Alert and oriented. No focal neurological deficits. Extremities: Symmetric, expected ROM Skin: No rashes, lesions Psychiatry: Demonstrates appropriate judgement and insight. Mood & affect appropriate for  situation.   Assessment & Plan:  Principal Problem:   Thrombosed hemorrhoids Active Problems:   Abdominal pain   HIV disease (HCC)   Suicidal ideation   MDD (major depressive disorder)    Thrombosed hemorrhoids - Continue with topical Anusol twice daily - General Surgery consulted, hemorrhoid appears to have spontaneously drained.  No need for acute surgical intervention at this time  Abdominal pain - CT abdomen: Hepatic steatosis, normal gallbladder with no distention, moderate colonic stool burden - Will start bowel regimen  Hepatic steatosis - Will need close outpatient follow-up for risk factor stratification and surveillance.  Suicidal ideation, major depressive disorder, anxiety - Currently admitted to behavioral health unit.  Will defer psychiatric medication management to behavioral health team.   DVT prophylaxis: Ambulation   Code Status: Full Code Family Communication: None at bedside  Antimicrobials:  Anti-infectives (From admission, onward)    None       Data Reviewed: I have personally reviewed following labs and imaging studies CBC: Recent Labs  Lab 07/17/23 2155  WBC 10.5  HGB 12.6  HCT 37.1  MCV 95.6  PLT 237   Basic Metabolic Panel: Recent Labs  Lab 07/17/23 2155  NA 135  K 3.8  CL 103  CO2 20*  GLUCOSE 121*  BUN 16  CREATININE 0.98  CALCIUM 8.8*   GFR: Estimated Creatinine Clearance: 90.8 mL/min (by C-G formula based on SCr of 0.98 mg/dL). Liver Function Tests: Recent Labs  Lab 07/17/23 2155  AST 22  ALT 26  ALKPHOS 42  BILITOT 0.6  PROT 7.9  ALBUMIN 4.1   CBG: No results for  input(s): "GLUCAP" in the last 168 hours.  Recent Results (from the past 240 hours)  Resp panel by RT-PCR (RSV, Flu A&B, Covid) Anterior Nasal Swab     Status: None   Collection Time: 07/11/23 10:22 PM   Specimen: Anterior Nasal Swab  Result Value Ref Range Status   SARS Coronavirus 2 by RT PCR NEGATIVE NEGATIVE Final    Comment:  (NOTE) SARS-CoV-2 target nucleic acids are NOT DETECTED.  The SARS-CoV-2 RNA is generally detectable in upper respiratory specimens during the acute phase of infection. The lowest concentration of SARS-CoV-2 viral copies this assay can detect is 138 copies/mL. A negative result does not preclude SARS-Cov-2 infection and should not be used as the sole basis for treatment or other patient management decisions. A negative result may occur with  improper specimen collection/handling, submission of specimen other than nasopharyngeal swab, presence of viral mutation(s) within the areas targeted by this assay, and inadequate number of viral copies(<138 copies/mL). A negative result must be combined with clinical observations, patient history, and epidemiological information. The expected result is Negative.  Fact Sheet for Patients:  BloggerCourse.com  Fact Sheet for Healthcare Providers:  SeriousBroker.it  This test is no t yet approved or cleared by the [place] FDA and  has been authorized for detection and/or diagnosis of SARS-CoV-2 by FDA under an Emergency Use Authorization (EUA). This EUA will remain  in effect (meaning this test can be used) for the duration of the COVID-19 declaration under Section 564(b)(1) of the Act, 21 U.S.C.section 360bbb-3(b)(1), unless the authorization is terminated  or revoked sooner.       Influenza A by PCR NEGATIVE NEGATIVE Final   Influenza B by PCR NEGATIVE NEGATIVE Final    Comment: (NOTE) The Xpert Xpress SARS-CoV-2/FLU/RSV plus assay is intended as an aid in the diagnosis of influenza from Nasopharyngeal swab specimens and should not be used as a sole basis for treatment. Nasal washings and aspirates are unacceptable for Xpert Xpress SARS-CoV-2/FLU/RSV testing.  Fact Sheet for Patients: BloggerCourse.com  Fact Sheet for Healthcare  Providers: SeriousBroker.it  This test is not yet approved or cleared by the [place] FDA and has been authorized for detection and/or diagnosis of SARS-CoV-2 by FDA under an Emergency Use Authorization (EUA). This EUA will remain in effect (meaning this test can be used) for the duration of the COVID-19 declaration under Section 564(b)(1) of the Act, 21 U.S.C. section 360bbb-3(b)(1), unless the authorization is terminated or revoked.     Resp Syncytial Virus by PCR NEGATIVE NEGATIVE Final    Comment: (NOTE) Fact Sheet for Patients: BloggerCourse.com  Fact Sheet for Healthcare Providers: SeriousBroker.it  This test is not yet approved or cleared by the [place] FDA and has been authorized for detection and/or diagnosis of SARS-CoV-2 by FDA under an Emergency Use Authorization (EUA). This EUA will remain in effect (meaning this test can be used) for the duration of the COVID-19 declaration under Section 564(b)(1) of the Act, 21 U.S.C. section 360bbb-3(b)(1), unless the authorization is terminated or revoked.  Performed at [facility], [address] [place], [place] [address]   Group A Strep by PCR [facility] Only)     Status: None   Collection Time: 07/11/23 10:22 PM   Specimen: Throat; Sterile Swab  Result Value Ref Range Status   Group A Strep by PCR NOT DETECTED NOT DETECTED Final    Comment: Performed at [facility], [address], [place], [place] [address]     Radiology  Studies: CT ABDOMEN PELVIS W CONTRAST Result Date: 07/19/2023 CLINICAL DATA:  Acute nonlocalized abdominal pain EXAM: CT ABDOMEN AND PELVIS WITH CONTRAST TECHNIQUE: Multidetector CT imaging of the abdomen and pelvis was performed using the standard protocol following bolus administration of intravenous contrast. RADIATION DOSE REDUCTION: This exam was performed according to the departmental  dose-optimization program which includes automated exposure control, adjustment of the mA and/or kV according to patient size and/or use of iterative reconstruction technique. CONTRAST:  OMNIPAQUE IOHEXOL 300 MG/ML  SOLN COMPARISON:  Ultrasound 07/12/2023 FINDINGS: Lower chest: No acute abnormality. Hepatobiliary: Marked hepatic steatosis. Normal gallbladder. No biliary dilation. Pancreas: Unremarkable. Spleen: Unremarkable. Adrenals/Urinary Tract: Normal adrenal glands. No urinary calculi or hydronephrosis. Bladder is unremarkable. Stomach/Bowel: Normal caliber large and small bowel. Moderate colonic stool burden. No bowel wall thickening. The appendix is normal.Stomach is within normal limits. Vascular/Lymphatic: No significant vascular findings are present. No enlarged abdominal or pelvic lymph nodes. Reproductive: Unremarkable. Other: No free intraperitoneal fluid or air. Musculoskeletal: No acute fracture. IMPRESSION: 1. No acute abnormality in the abdomen or pelvis. 2. Marked hepatic steatosis. 3. Moderate colonic stool burden.  Correlate for constipation. Electronically Signed   By: Minerva Fester M.D.   On: 07/19/2023 03:20    Scheduled Meds:  feeding supplement  1 Bottle Oral TID BM   hydrocortisone   Rectal BID   influenza vac split trivalent PF  0.5 mL Intramuscular Tomorrow-1000   nicotine  14 mg Transdermal Daily   Continuous Infusions:   LOS: 1 day    Debarah Crape, DO [facility]  To contact the attending physician between 7A-7P please use Epic Chat. To contact the covering physician during after hours 7P-7A, please review Amion.   07/19/2023, 8:11 AM   *This document has been created with the assistance of dictation software. Please excuse typographical errors. *

## 2023-07-19 NOTE — Progress Notes (Signed)
   07/19/23 0820  Psych Admission Type (Psych Patients Only)  Admission Status Voluntary  Psychosocial Assessment  Patient Complaints Anxiety  Eye Contact Brief  Facial Expression Anxious  Affect Anxious  Speech Slow  Interaction Childlike  Motor Activity Slow  Appearance/Hygiene In scrubs  Behavior Characteristics Cooperative;Anxious  Mood Anxious  Thought Process  Coherency Concrete thinking  Content Preoccupation  Delusions None reported or observed  Perception WDL  Hallucination None reported or observed  Judgment Limited  Confusion Mild  Danger to Self  Current suicidal ideation? Passive  Agreement Not to Harm Self Yes  Description of Agreement Verbal  Danger to Others  Danger to Others None reported or observed

## 2023-07-19 NOTE — Progress Notes (Signed)
Patient denies SI and AVH.  07/19/23 0400  Psych Admission Type (Psych Patients Only)  Admission Status Voluntary  Psychosocial Assessment  Patient Complaints None  Eye Contact Brief  Affect Depressed  Speech Slow  Interaction Needy  Motor Activity Slow  Appearance/Hygiene In scrubs  Behavior Characteristics Cooperative;Appropriate to situation  Mood Pleasant  Aggressive Behavior  Effect No apparent injury  Thought Process  Content Preoccupation  Delusions None reported or observed  Perception WDL  Hallucination None reported or observed  Judgment Limited  Confusion Mild  Danger to Self  Current suicidal ideation? Passive  Agreement Not to Harm Self Yes  Danger to Others  Danger to Others None reported or observed

## 2023-07-19 NOTE — Group Note (Signed)
Date:  07/19/2023 Time:  10:33 AM  Group Topic/Focus:  Conflict Resolution:   The focus of this group is to discuss the conflict resolution process and how it may be used upon discharge. Goals Group:   The focus of this group is to help patients establish daily goals to achieve during treatment and discuss how the patient can incorporate goal setting into their daily lives to aide in recovery.    Participation Level:  Minimal  Participation Quality:  Appropriate  Affect:  Appropriate  Cognitive:  Appropriate  Insight: Limited  Engagement in Group:  Developing/Improving  Modes of Intervention:  Activity  Additional Comments:    Crystal Black 07/19/2023, 10:33 AM
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
## 2023-07-19 NOTE — BH IP Treatment Plan (Signed)
Interdisciplinary Treatment and Diagnostic Plan Update  07/19/2023 Time of Session: 11:00 Crystal Black MRN: [id number]  Principal Diagnosis: Thrombosed hemorrhoids  Secondary Diagnoses: Principal Problem:   Thrombosed hemorrhoids Active Problems:   HIV disease (HCC)   Suicidal ideation   MDD (major depressive disorder)   Abdominal pain   Current Medications:  Current Facility-Administered Medications  Medication Dose Route Frequency Provider Last Rate Last Admin   acetaminophen (TYLENOL) tablet 650 mg  650 mg Oral Q6H PRN McLauchlin, [name], NP       alum & mag hydroxide-simeth (MAALOX/MYLANTA) 200-200-20 MG/5ML suspension 30 mL  30 mL Oral Q4H PRN McLauchlin, [name], NP       haloperidol (HALDOL) tablet 5 mg  5 mg Oral TID PRN McLauchlin, [name], NP       And   diphenhydrAMINE (BENADRYL) capsule 50 mg  50 mg Oral TID PRN McLauchlin, [name], NP       haloperidol (HALDOL) tablet 5 mg  5 mg Oral TID PRN [name], NP       And   diphenhydrAMINE (BENADRYL) capsule 50 mg  50 mg Oral TID PRN [name], NP       haloperidol lactate (HALDOL) injection 5 mg  5 mg Intramuscular TID PRN McLauchlin, [name], NP       And   diphenhydrAMINE (BENADRYL) injection 50 mg  50 mg Intramuscular TID PRN McLauchlin, [name], NP       And   LORazepam (ATIVAN) injection 2 mg  2 mg Intramuscular TID PRN McLauchlin, [name], NP       haloperidol lactate (HALDOL) injection 10 mg  10 mg Intramuscular TID PRN McLauchlin, [name], NP       And   diphenhydrAMINE (BENADRYL) injection 50 mg  50 mg Intramuscular TID PRN McLauchlin, [name], NP       And   LORazepam (ATIVAN) injection 2 mg  2 mg Intramuscular TID PRN McLauchlin, [name], NP       haloperidol lactate (HALDOL) injection 5 mg  5 mg Intramuscular TID PRN [name], NP       And   diphenhydrAMINE (BENADRYL) injection 50 mg  50 mg Intramuscular TID PRN [name], NP       And   LORazepam (ATIVAN) injection 2 mg  2  mg Intramuscular TID PRN [name], NP       haloperidol lactate (HALDOL) injection 10 mg  10 mg Intramuscular TID PRN [name], NP       And   diphenhydrAMINE (BENADRYL) injection 50 mg  50 mg Intramuscular TID PRN [name], NP       And   LORazepam (ATIVAN) injection 2 mg  2 mg Intramuscular TID PRN [name], NP       feeding supplement (ENSURE ENLIVE / ENSURE PLUS) liquid 237 mL  1 Bottle Oral TID BM [name], NP   237 mL at 07/18/23 2126   hydrocortisone (ANUSOL-HC) 2.5 % rectal cream   Rectal BID [name], MD   1 Application at 07/19/23 1478   hydrOXYzine (ATARAX) tablet 25 mg  25 mg Oral TID PRN McLauchlin, [name], NP   25 mg at 07/19/23 2956   influenza vac split trivalent PF (FLULAVAL) injection 0.5 mL  0.5 mL Intramuscular Tomorrow-1000 [name], MD       magnesium hydroxide (MILK OF MAGNESIA) suspension 30 mL  30 mL Oral Daily PRN De Burrs, NP  nicotine (NICODERM CQ - dosed in mg/24 hours) patch 14 mg  14 mg Transdermal Daily [name], MD       nicotine polacrilex (NICORETTE) gum 2 mg  2 mg Oral PRN [name], NP   2 mg at 07/19/23 0820   traZODone (DESYREL) tablet 50 mg  50 mg Oral QHS PRN McLauchlin, [name], NP   50 mg at 07/18/23 2129   PTA Medications: Medications Prior to Admission  Medication Sig Dispense Refill Last Dose/Taking   albuterol (VENTOLIN HFA) 108 (90 Base) MCG/ACT inhaler Inhale 2 puffs into the lungs every 4 (four) hours as needed for wheezing or shortness of breath.   Taking As Needed   ARIPiprazole (ABILIFY) 2 MG tablet Take 1 tablet (2 mg total) by mouth daily. 12 tablet 0 07/17/2023 at  8:00 AM   [START ON 08/06/2023] ARIPiprazole ER (ABILIFY MAINTENA) 400 MG SRER injection Inject 1.5 mLs (300 mg total) into the muscle every 28 (twenty-eight) days. 1.5 mL 0 06/30/2023   benztropine (COGENTIN) 1 MG tablet Take 1 tablet (1 mg total) by mouth 2 (two) times daily. 60 tablet 0  07/17/2023 at  8:00 PM   darunavir-cobicistat (PREZCOBIX) 800-150 MG tablet Take 1 tablet by mouth daily with breakfast. Swallow whole. Do NOT crush, break or chew tablets. Take with food. 30 tablet 1 07/17/2023 at  8:00 AM   divalproex (DEPAKOTE ER) 250 MG 24 hr tablet Take 1 tablet (250 mg total) by mouth 2 (two) times daily. 60 tablet 0 07/17/2023 at  8:00 PM   docusate sodium (COLACE) 100 MG capsule Take 300 mg by mouth daily.   07/17/2023 at  8:00 AM   dolutegravir (TIVICAY) 50 MG tablet Take 1 tablet (50 mg total) by mouth daily. 30 tablet 1 07/17/2023 at  8:00 AM   EPINEPHrine 0.3 mg/0.3 mL IJ SOAJ injection Inject 0.3 mg into the muscle as needed for anaphylaxis.   Taking As Needed   famotidine (PEPCID) 20 MG tablet Take 20 mg by mouth 2 (two) times daily.   Taking   FIBER ADULT GUMMIES PO Take 1 tablet by mouth in the morning and at bedtime.   07/17/2023 at  8:00 PM   FLUoxetine (PROZAC) 40 MG capsule Take 40 mg by mouth at bedtime.   07/17/2023 at  8:00 PM   glycerin adult 2 g suppository Place 1 suppository rectally as needed for constipation.   Taking As Needed   hydrOXYzine (ATARAX) 25 MG tablet Take 1 tablet (25 mg total) by mouth 3 (three) times daily as needed. 60 tablet 1 Taking As Needed   ibuprofen (ADVIL) 800 MG tablet Take 1 tablet (800 mg total) by mouth every 8 (eight) hours as needed for moderate pain. 15 tablet 0 Taking As Needed   ketoconazole (NIZORAL) 2 % shampoo Apply 1 Application topically. Monday/ Wednesday/ Friday   07/17/2023 at  3:00 PM   linaclotide (LINZESS) 290 MCG CAPS capsule Take 290 mcg by mouth daily before breakfast.   07/17/2023 at  7:00 AM   magnesium citrate SOLN Take 1 Bottle by mouth daily as needed for severe constipation.   Taking As Needed   melatonin 5 MG TABS Take 0.5 tablets (2.5 mg total) by mouth at bedtime. 15 tablet 0 07/17/2023 at  8:00 PM   methocarbamol (ROBAXIN) 500 MG tablet Take 500 mg by mouth at bedtime.   Taking   montelukast (SINGULAIR) 10  MG tablet Take 10 mg by mouth daily.   07/17/2023 at  8:00 AM   nicotine (NICODERM CQ - DOSED IN MG/24 HR) 7 mg/24hr patch Place 1 patch (7 mg total) onto the skin daily. 360 patch 0 07/17/2023 at  8:00 AM   nicotine polacrilex (NICORETTE) 2 MG gum Take 2 mg by mouth as needed for smoking cessation.   Taking As Needed   nicotine polacrilex (NICOTINE MINI) 4 MG lozenge Take 1 lozenge (4 mg total) by mouth as needed. 100 tablet 0 Taking As Needed   ondansetron (ZOFRAN-ODT) 4 MG disintegrating tablet Take 1 tablet (4 mg total) by mouth every 8 (eight) hours as needed for nausea or vomiting. 20 tablet 0 Taking As Needed   prazosin (MINIPRESS) 5 MG capsule Take 1 capsule (5 mg total) by mouth at bedtime. 30 capsule 1 07/17/2023 at  8:00 PM   risperiDONE (RISPERDAL) 0.5 MG tablet Take 0.5 mg by mouth 2 (two) times daily. 0800/1600   07/17/2023 at  4:00 PM   topiramate (TOPAMAX) 50 MG tablet Take 1 tablet (50 mg total) by mouth 2 (two) times daily. (Patient taking differently: Take 50 mg by mouth at bedtime.) 60 tablet 1 07/17/2023 at  8:00 PM   traZODone (DESYREL) 50 MG tablet Take 50 mg by mouth at bedtime.   07/17/2023 at  8:00 PM   etonogestrel (NEXPLANON) 68 MG IMPL implant 1 each (68 mg total) by Subdermal route once for 1 dose. 1 each 0     Patient Stressors:    Patient Strengths:    Treatment Modalities: Medication Management, Group therapy, Case management,  1 to 1 session with clinician, Psychoeducation, Recreational therapy.   Physician Treatment Plan for Primary Diagnosis: Thrombosed hemorrhoids Long Term Goal(s):     Short Term Goals:    Medication Management: Evaluate patient's response, side effects, and tolerance of medication regimen.  Therapeutic Interventions: 1 to 1 sessions, Unit Group sessions and Medication administration.  Evaluation of Outcomes: Not Met  Physician Treatment Plan for Secondary Diagnosis: Principal Problem:   Thrombosed hemorrhoids Active Problems:   HIV  disease (HCC)   Suicidal ideation   MDD (major depressive disorder)   Abdominal pain  Long Term Goal(s):     Short Term Goals:       Medication Management: Evaluate patient's response, side effects, and tolerance of medication regimen.  Therapeutic Interventions: 1 to 1 sessions, Unit Group sessions and Medication administration.  Evaluation of Outcomes: Not Met   RN Treatment Plan for Primary Diagnosis: Thrombosed hemorrhoids Long Term Goal(s): Knowledge of disease and therapeutic regimen to maintain health will improve  Short Term Goals: Ability to remain free from injury will improve, Ability to verbalize frustration and anger appropriately will improve, Ability to demonstrate self-control, Ability to participate in decision making will improve, Ability to verbalize feelings will improve, Ability to disclose and discuss suicidal ideas, Ability to identify and develop effective coping behaviors will improve, and Compliance with prescribed medications will improve  Medication Management: RN will administer medications as ordered by provider, will assess and evaluate patient's response and provide education to patient for prescribed medication. RN will report any adverse and/or side effects to prescribing provider.  Therapeutic Interventions: 1 on 1 counseling sessions, Psychoeducation, Medication administration, Evaluate responses to treatment, Monitor vital signs and CBGs as ordered, Perform/monitor CIWA, COWS, AIMS and Fall Risk screenings as ordered, Perform wound care treatments as ordered.  Evaluation of Outcomes: Not Met   LCSW Treatment Plan for Primary Diagnosis: Thrombosed hemorrhoids Long Term Goal(s): Safe transition to appropriate next level of care  at discharge, Engage patient in therapeutic group addressing interpersonal concerns.  Short Term Goals: Engage patient in aftercare planning with referrals and resources, Increase social support, Increase ability to  appropriately verbalize feelings, Increase emotional regulation, Facilitate acceptance of mental health diagnosis and concerns, and Increase skills for wellness and recovery  Therapeutic Interventions: Assess for all discharge needs, 1 to 1 time with Social worker, Explore available resources and support systems, Assess for adequacy in community support network, Educate family and significant other(s) on suicide prevention, Complete Psychosocial Assessment, Interpersonal group therapy.  Evaluation of Outcomes: Not Met   Progress in Treatment: Attending groups: Yes. Participating in groups: Yes. Taking medication as prescribed: Yes. Toleration medication: Yes. Family/Significant other contact made: No, will contact:  guardian, [name] [name]. Patient understands diagnosis: Yes. Discussing patient identified problems/goals with staff: Yes. Medical problems stabilized or resolved: Yes. Denies suicidal/homicidal ideation: Yes. Issues/concerns per patient self-inventory: No. Other: none.  New problem(s) identified: No, Describe:  none identified.  New Short Term/Long Term Goal(s): medication management for mood stabilization; elimination of SI thoughts; development of comprehensive mental wellness plan.  Patient Goals:  Pt declined to participate in treatment team meeting.   Discharge Plan or Barriers: CSW will assist pt/guardian with development of an appropriate aftercare/discharge plan.   Reason for Continuation of Hospitalization: Depression Medication stabilization Suicidal ideation  Estimated Length of Stay: 1-7 days  Last 3 [place] Suicide Severity Risk Score: Flowsheet Row Admission (Current) from 07/18/2023 in [facility]PATIENT BE[facility]INE ED from 07/11/2023 in [facility] Emergency Department at [facility] Admission (Discharged) from 07/05/2023 in [facility] INPATIENT BEHAVIORAL MEDICINE  C-SSRS RISK CATEGORY High Risk Error: Q3, 4, or 5 should not be populated when Q2 is No  High Risk       Last PHQ 2/9 Scores:    05/04/2023    9:21 AM 02/16/2023    9:49 AM 11/17/2022   10:53 AM  Depression screen PHQ 2/9  Decreased Interest 0 0 0  Down, Depressed, Hopeless 0 0 1  PHQ - 2 Score 0 0 1    Scribe for Treatment Team: Glenis Smoker, LCSW 07/19/2023 11:02 AM

## 2023-07-19 NOTE — Group Note (Signed)
Date:  07/19/2023 Time:  5:03 PM  Group Topic/Focus:  Healthy Communication:   The focus of this group is to discuss communication, barriers to communication, as well as healthy ways to communicate with others. Movie Therapy    Participation Level:  Minimal  Participation Quality:  Appropriate  Affect:  Appropriate  Cognitive:  Appropriate  Insight: Appropriate  Engagement in Group:  Developing/Improving  Modes of Intervention:  Activity  Additional Comments:    Kenya Shiraishi 07/19/2023, 5:03 PM

## 2023-07-19 NOTE — Group Note (Signed)
[facility] LCSW Group Therapy Note   Group Date: 07/19/2023 Start Time: 1300 End Time: 1400   Type of Therapy/Topic:  Group Therapy:  Emotion Regulation  Participation Level:  Active    Description of Group:    The purpose of this group is to assist patients in learning to regulate negative emotions and experience positive emotions. Patients will be guided to discuss ways in which they have been vulnerable to their negative emotions. These vulnerabilities will be juxtaposed with experiences of positive emotions or situations, and patients challenged to use positive emotions to combat negative ones. Special emphasis will be placed on coping with negative emotions in conflict situations, and patients will process healthy conflict resolution skills.  Therapeutic Goals: Patient will identify two positive emotions or experiences to reflect on in order to balance out negative emotions:  Patient will label two or more emotions that they find the most difficult to experience:  Patient will be able to demonstrate positive conflict resolution skills through discussion or role plays:   Summary of Patient Progress: Patient was present for the entirety of the group process. She shared a personal experience of how she dealt with a negative emotion in a negative way and was allowed to process how she could have done this differently. Pt talked about a lot of coping skills that she utilizes including listening to music, walking away, writing down the issues to talk with the individual about them at a later time. Her comments helped further the conversation. She appeared open and receptive to feedback/comments from both her peers and facilitator.    Therapeutic Modalities:   Cognitive Behavioral Therapy Feelings Identification Dialectical Behavioral Therapy   Glenis Smoker, LCSW
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
## 2023-07-19 NOTE — Plan of Care (Signed)
°  Problem: Education: °Goal: Emotional status will improve °Outcome: Progressing °Goal: Mental status will improve °Outcome: Progressing °Goal: Verbalization of understanding the information provided will improve °Outcome: Progressing °  °
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
## 2023-07-19 NOTE — Group Note (Signed)
Date:  07/19/2023 Time:  9:18 PM  Group Topic/Focus:  Self Care:   The focus of this group is to help patients understand the importance of self-care in order to improve or restore emotional, physical, spiritual, interpersonal, and financial health.    Participation Level:  Did Not Attend  Participation Quality:   none  Affect:   none  Cognitive:   none  Insight: None  Engagement in Group:   none  Modes of Intervention:   none  Additional Comments:  none  [name] 07/19/2023, 9:18 PM

## 2023-07-19 NOTE — Plan of Care (Signed)
  Problem: Education: Goal: Knowledge of [facility] Education information/materials will improve Outcome: Progressing Goal: Emotional status will improve Outcome: Progressing   
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
## 2023-07-20 DIAGNOSIS — K645 Perianal venous thrombosis: Secondary | ICD-10-CM | POA: Diagnosis not present

## 2023-07-20 DIAGNOSIS — F331 Major depressive disorder, recurrent, moderate: Secondary | ICD-10-CM

## 2023-07-20 DIAGNOSIS — R1084 Generalized abdominal pain: Secondary | ICD-10-CM

## 2023-07-20 LAB — RPR: RPR Ser Ql: NONREACTIVE

## 2023-07-20 LAB — VALPROIC ACID LEVEL: Valproic Acid Lvl: 16 ug/mL — ABNORMAL LOW (ref 50.0–100.0)

## 2023-07-20 LAB — GLUCOSE, CAPILLARY: Glucose-Capillary: 101 mg/dL — ABNORMAL HIGH (ref 70–99)

## 2023-07-20 MED ORDER — ALBUTEROL SULFATE HFA 108 (90 BASE) MCG/ACT IN AERS
2.0000 | INHALATION_SPRAY | RESPIRATORY_TRACT | Status: DC | PRN
  Administered 2023-07-20 – 2023-08-23 (×6): 2 via RESPIRATORY_TRACT
  Filled 2023-07-20: qty 6.7

## 2023-07-20 NOTE — Plan of Care (Signed)
Alert and oriented x4. Denies SI/HI/AVH, but no pain. 15 min safety checks. Care, comfort and safety maintained/ongoing.   Problem: Education: Goal: Knowledge of [place] [facility] Education information/materials will improve Outcome: Progressing Goal: Emotional status will improve Outcome: Progressing Goal: Mental status will improve Outcome: Progressing Goal: Verbalization of understanding the information provided will improve Outcome: Progressing   Problem: Activity: Goal: Interest or engagement in activities will improve Outcome: Progressing Goal: Sleeping patterns will improve Outcome: Progressing   Problem: Coping: Goal: Ability to verbalize frustrations and anger appropriately will improve Outcome: Progressing Goal: Ability to demonstrate self-control will improve Outcome: Progressing   Problem: Health Behavior/Discharge Planning: Goal: Identification of resources available to assist in meeting health care needs will improve Outcome: Progressing Goal: Compliance with treatment plan for underlying cause of condition will improve Outcome: Progressing   Problem: Physical Regulation: Goal: Ability to maintain clinical measurements within normal limits will improve Outcome: Progressing   Problem: Safety: Goal: Periods of time without injury will increase Outcome: Progressing   Problem: Education: Goal: Utilization of techniques to improve thought processes will improve Outcome: Progressing Goal: Knowledge of the prescribed therapeutic regimen will improve Outcome: Progressing   Problem: Activity: Goal: Interest or engagement in leisure activities will improve Outcome: Progressing Goal: Imbalance in normal sleep/wake cycle will improve Outcome: Progressing   Problem: Coping: Goal: Coping ability will improve Outcome: Progressing Goal: Will verbalize feelings Outcome: Progressing   Problem: Health Behavior/Discharge Planning: Goal: Ability to make decisions  will improve Outcome: Progressing Goal: Compliance with therapeutic regimen will improve Outcome: Progressing   Problem: Role Relationship: Goal: Will demonstrate positive changes in social behaviors and relationships Outcome: Progressing   Problem: Safety: Goal: Ability to disclose and discuss suicidal ideas will improve Outcome: Progressing Goal: Ability to identify and utilize support systems that promote safety will improve Outcome: Progressing   Problem: Self-Concept: Goal: Will verbalize positive feelings about self Outcome: Progressing Goal: Level of anxiety will decrease Outcome: Progressing   Problem: Education: Goal: Knowledge of General Education information will improve Description: Including pain rating scale, medication(s)/side effects and non-pharmacologic comfort measures Outcome: Progressing   Problem: Health Behavior/Discharge Planning: Goal: Ability to manage health-related needs will improve Outcome: Progressing

## 2023-07-20 NOTE — Group Note (Signed)
Recreation Therapy Group Note   Group Topic:Goal Setting  Group Date: 07/20/2023 Start Time: 1010 End Time: 1100 Facilitators: Rosina Lowenstein, LRT, CTRS Location:  Craft Room  Group Description: Product/process development scientist. Patients were given many different magazines, a glue stick, markers, and a piece of cardstock paper. LRT and pts discussed the importance of having goals in life. LRT and pts discussed the difference between short-term and long-term goals, as well as what a SMART goal is. LRT encouraged pts to create a vision board, with images they picked and then cut out with safety scissors from the magazine, for themselves, that capture their short and long-term goals. LRT encouraged pts to show and explain their vision board to the group.   Goal Area(s) Addressed:  Patient will gain knowledge of short vs. long term goals.  Patient will identify goals for themselves. Patient will practice setting SMART goals. Patient will verbalize their goals to LRT and peers.   Affect/Mood: N/A   Participation Level: Minimal    Clinical Observations/Individualized Feedback: Inessa came to group with 5 minutes remaining.  Plan: Continue to engage patient in RT group sessions 2-3x/week.   Rosina Lowenstein, LRT, CTRS 07/20/2023 1:09 PM
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
## 2023-07-20 NOTE — Progress Notes (Signed)
   07/20/23 0445  Psych Admission Type (Psych Patients Only)  Admission Status Voluntary  Psychosocial Assessment  Patient Complaints Anxiety  Eye Contact Brief  Facial Expression Anxious  Affect Depressed  Speech Slow  Interaction Needy  Motor Activity Slow  Appearance/Hygiene In scrubs  Behavior Characteristics Cooperative;Appropriate to situation  Mood Anxious  Aggressive Behavior  Effect No apparent injury  Thought Process  Coherency WDL  Content Preoccupation  Delusions None reported or observed  Perception WDL  Hallucination None reported or observed  Judgment Limited  Confusion Mild  Danger to Self  Current suicidal ideation? Passive  Agreement Not to Harm Self Yes  Danger to Others  Danger to Others None reported or observed
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
## 2023-07-20 NOTE — Progress Notes (Signed)
   07/20/23 1009  Psych Admission Type (Psych Patients Only)  Admission Status Voluntary  Psychosocial Assessment  Patient Complaints Anxiety  Eye Contact Brief  Facial Expression Anxious  Affect Depressed  Speech Slow  Interaction Needy  Motor Activity Slow  Appearance/Hygiene In scrubs  Behavior Characteristics Cooperative;Appropriate to situation  Mood Anxious  Aggressive Behavior  Effect No apparent injury  Thought Process  Coherency WDL  Content Preoccupation  Delusions None reported or observed  Perception WDL  Hallucination None reported or observed  Judgment Limited  Confusion Mild  Danger to Self  Current suicidal ideation? Denies  Agreement Not to Harm Self Yes  Description of Agreement Verbal  Danger to Others  Danger to Others None reported or observed
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
## 2023-07-20 NOTE — Progress Notes (Signed)
HOSPITALIST CONSULT NOTE    Crystal Black  ZOX:[id number] DOB: 07-Dec-1995 DOA: 07/18/2023 PCP: Alease Medina, MD  No chief complaint on file.   Hospital Course:  Crystal Black is 28 y.o. female with HIV, asthma, and carries multiple psychiatric diagnoses including: ADHD, anxiety, PTSD, borderline personality disorder, polysubstance abuse, bipolar disorder, MDD, who presented to [facility] with suicidal ideation.  Patient was recently discharged from inpatient psychiatry on 1/14.  She was admitted to psychiatry service and medicine consult was requested.  Patient reports nausea, vomiting, diarrhea, and rectal cyst.  She also endorses abdominal pain in the upper abdomen as well as bright red blood per rectum.  Subjective: No acute events overnight. On evaluation today patient continues to complain of abdominal pain.  She reports she is tender in the epigastrium which is worse after eating.  She is also endorsing lower abdominal pain.  Ports she has had difficulty eating due to nausea and epigastric pain.  She is also drinking Ensure.    Objective: Vitals:   07/19/23 0219 07/19/23 1630 07/19/23 1715 07/20/23 0637  BP: 120/72 (!) 127/90 100/68 (!) 95/52  Pulse: (!) 104 95 (!) 112 72  Resp: 20 18  18   Temp: 97.7 F (36.5 C) 97.9 F (36.6 C)  (!) 97.2 F (36.2 C)  TempSrc: Oral     SpO2: 99% 100% 99% 96%  Weight:      Height:       No intake or output data in the 24 hours ending 07/20/23 1006 Filed Weights   07/18/23 1300  Weight: 86.6 kg    Examination: General exam: Appears calm and comfortable, NAD  Respiratory system: No work of breathing, symmetric chest wall expansion Cardiovascular system: S1 & S2 heard, RRR.  Gastrointestinal system: Abdomen is nondistended, soft and diffusely tender to palpation, no acute abdomen, no guarding.  Neuro: Alert and oriented. No focal neurological deficits. Extremities: Symmetric, expected ROM Skin: No rashes, lesions Psychiatry: Demonstrates  appropriate judgement and insight. Mood & affect appropriate for situation.   Assessment & Plan:  Principal Problem:   MDD (major depressive disorder) Active Problems:   Thrombosed hemorrhoids   Abdominal pain   HIV disease (HCC)    Thrombosed hemorrhoids - Continue with topical Anusol twice daily - General Surgery consulted, hemorrhoid appears to have spontaneously drained.  No need for acute surgical intervention at this time - Avoid constipation  Abdominal pain - CT abdomen: Hepatic steatosis, normal gallbladder with no distention, moderate colonic stool burden - Unclear etiology of persistent pain, will proceed with transvaginal ultrasound to better evaluate.  Have discussed this with the patient who is amendable to this exam - Maalox as needed - Continue bowel regimen  Hepatic steatosis - Will need close outpatient follow-up for risk factor stratification and surveillance.  HIV - Resume home meds  Suicidal ideation, major depressive disorder, anxiety - Currently admitted to behavioral health unit.  Will defer psychiatric medication management to behavioral health team.     DVT prophylaxis: Ambulation   Code Status: Full Code Family Communication: None at bedside  Antimicrobials:  Anti-infectives (From admission, onward)    Start     Dose/Rate Route Frequency Ordered Stop   07/20/23 0800  darunavir-cobicistat (PREZCOBIX) 800-150 MG per tablet 1 tablet        1 tablet Oral Daily with breakfast 07/19/23 1633     07/19/23 1730  dolutegravir (TIVICAY) tablet 50 mg        50 mg Oral Daily 07/19/23 1633  Data Reviewed: I have personally reviewed following labs and imaging studies CBC: Recent Labs  Lab 07/17/23 2155 07/19/23 0805  WBC 10.5  --   HGB 12.6 13.2  HCT 37.1 37.9  MCV 95.6  --   PLT 237  --    Basic Metabolic Panel: Recent Labs  Lab 07/17/23 2155  NA 135  K 3.8  CL 103  CO2 20*  GLUCOSE 121*  BUN 16  CREATININE 0.98  CALCIUM  8.8*   GFR: Estimated Creatinine Clearance: 90.8 mL/min (by C-G formula based on SCr of 0.98 mg/dL). Liver Function Tests: Recent Labs  Lab 07/17/23 2155  AST 22  ALT 26  ALKPHOS 42  BILITOT 0.6  PROT 7.9  ALBUMIN 4.1   CBG: No results for input(s): "GLUCAP" in the last 168 hours.  Recent Results (from the past 240 hours)  Resp panel by RT-PCR (RSV, Flu A&B, Covid) Anterior Nasal Swab     Status: None   Collection Time: 07/11/23 10:22 PM   Specimen: Anterior Nasal Swab  Result Value Ref Range Status   SARS Coronavirus 2 by RT PCR NEGATIVE NEGATIVE Final    Comment: (NOTE) SARS-CoV-2 target nucleic acids are NOT DETECTED.  The SARS-CoV-2 RNA is generally detectable in upper respiratory specimens during the acute phase of infection. The lowest concentration of SARS-CoV-2 viral copies this assay can detect is 138 copies/mL. A negative result does not preclude SARS-Cov-2 infection and should not be used as the sole basis for treatment or other patient management decisions. A negative result may occur with  improper specimen collection/handling, submission of specimen other than nasopharyngeal swab, presence of viral mutation(s) within the areas targeted by this assay, and inadequate number of viral copies(<138 copies/mL). A negative result must be combined with clinical observations, patient history, and epidemiological information. The expected result is Negative.  Fact Sheet for Patients:  BloggerCourse.com  Fact Sheet for Healthcare Providers:  SeriousBroker.it  This test is no t yet approved or cleared by the [place] FDA and  has been authorized for detection and/or diagnosis of SARS-CoV-2 by FDA under an Emergency Use Authorization (EUA). This EUA will remain  in effect (meaning this test can be used) for the duration of the COVID-19 declaration under Section 564(b)(1) of the Act, 21 U.S.C.section  360bbb-3(b)(1), unless the authorization is terminated  or revoked sooner.       Influenza A by PCR NEGATIVE NEGATIVE Final   Influenza B by PCR NEGATIVE NEGATIVE Final    Comment: (NOTE) The Xpert Xpress SARS-CoV-2/FLU/RSV plus assay is intended as an aid in the diagnosis of influenza from Nasopharyngeal swab specimens and should not be used as a sole basis for treatment. Nasal washings and aspirates are unacceptable for Xpert Xpress SARS-CoV-2/FLU/RSV testing.  Fact Sheet for Patients: BloggerCourse.com  Fact Sheet for Healthcare Providers: SeriousBroker.it  This test is not yet approved or cleared by the [place] FDA and has been authorized for detection and/or diagnosis of SARS-CoV-2 by FDA under an Emergency Use Authorization (EUA). This EUA will remain in effect (meaning this test can be used) for the duration of the COVID-19 declaration under Section 564(b)(1) of the Act, 21 U.S.C. section 360bbb-3(b)(1), unless the authorization is terminated or revoked.     Resp Syncytial Virus by PCR NEGATIVE NEGATIVE Final    Comment: (NOTE) Fact Sheet for Patients: BloggerCourse.com  Fact Sheet for Healthcare Providers: SeriousBroker.it  This test is not yet approved or cleared by the [place] FDA and has been  authorized for detection and/or diagnosis of SARS-CoV-2 by FDA under an Emergency Use Authorization (EUA). This EUA will remain in effect (meaning this test can be used) for the duration of the COVID-19 declaration under Section 564(b)(1) of the Act, 21 U.S.C. section 360bbb-3(b)(1), unless the authorization is terminated or revoked.  Performed at [facility], [address] [place], [place] [address]   Group A Strep by PCR [facility] Only)     Status: None   Collection Time: 07/11/23 10:22 PM   Specimen: Throat; Sterile Swab  Result Value Ref Range  Status   Group A Strep by PCR NOT DETECTED NOT DETECTED Final    Comment: Performed at [facility], [address] [place], [place] [address]     Radiology Studies: CT ABDOMEN PELVIS W CONTRAST Result Date: 07/19/2023 CLINICAL DATA:  Acute nonlocalized abdominal pain EXAM: CT ABDOMEN AND PELVIS WITH CONTRAST TECHNIQUE: Multidetector CT imaging of the abdomen and pelvis was performed using the standard protocol following bolus administration of intravenous contrast. RADIATION DOSE REDUCTION: This exam was performed according to the departmental dose-optimization program which includes automated exposure control, adjustment of the mA and/or kV according to patient size and/or use of iterative reconstruction technique. CONTRAST:  OMNIPAQUE IOHEXOL 300 MG/ML  SOLN COMPARISON:  Ultrasound 07/12/2023 FINDINGS: Lower chest: No acute abnormality. Hepatobiliary: Marked hepatic steatosis. Normal gallbladder. No biliary dilation. Pancreas: Unremarkable. Spleen: Unremarkable. Adrenals/Urinary Tract: Normal adrenal glands. No urinary calculi or hydronephrosis. Bladder is unremarkable. Stomach/Bowel: Normal caliber large and small bowel. Moderate colonic stool burden. No bowel wall thickening. The appendix is normal.Stomach is within normal limits. Vascular/Lymphatic: No significant vascular findings are present. No enlarged abdominal or pelvic lymph nodes. Reproductive: Unremarkable. Other: No free intraperitoneal fluid or air. Musculoskeletal: No acute fracture. IMPRESSION: 1. No acute abnormality in the abdomen or pelvis. 2. Marked hepatic steatosis. 3. Moderate colonic stool burden.  Correlate for constipation. Electronically Signed   By: Minerva Fester M.D.   On: 07/19/2023 03:20    Scheduled Meds:  benztropine  1 mg Oral Daily   darunavir-cobicistat  1 tablet Oral Q breakfast   divalproex  250 mg Oral Daily   docusate sodium  100 mg Oral Daily   dolutegravir  50 mg Oral Daily   famotidine   20 mg Oral Daily   feeding supplement (GLUCERNA SHAKE)  237 mL Oral TID BM   FLUoxetine  40 mg Oral Daily   hydrocortisone   Rectal BID   influenza vac split trivalent PF  0.5 mL Intramuscular Tomorrow-1000   lactulose  10 g Oral BID   linaclotide  290 mcg Oral QAC breakfast   melatonin  5 mg Oral QHS   methocarbamol  500 mg Oral QHS   montelukast  5 mg Oral QHS   nicotine  14 mg Transdermal Daily   Continuous Infusions:   LOS: 2 days    Debarah Crape, DO [facility]  To contact the attending physician between 7A-7P please use Epic Chat. To contact the covering physician during after hours 7P-7A, please review Amion.   07/20/2023, 10:06 AM   *This document has been created with the assistance of dictation software. Please excuse typographical errors. *

## 2023-07-20 NOTE — Group Note (Signed)
LCSW Group Therapy Note  Group Date: 07/20/2023 Start Time: 1300 End Time: 1400   Type of Therapy and Topic:  Group Therapy - Healthy vs Unhealthy Coping Skills  Participation Level:  Did Not Attend   Description of Group The focus of this group was to determine what unhealthy coping techniques typically are used by group members and what healthy coping techniques would be helpful in coping with various problems. Patients were guided in becoming aware of the differences between healthy and unhealthy coping techniques. Patients were asked to identify 2-3 healthy coping skills they would like to learn to use more effectively.  Therapeutic Goals Patients learned that coping is what human beings do all day long to deal with various situations in their lives Patients defined and discussed healthy vs unhealthy coping techniques Patients identified their preferred coping techniques and identified whether these were healthy or unhealthy Patients determined 2-3 healthy coping skills they would like to become more familiar with and use more often. Patients provided support and ideas to each other   Summary of Patient Progress:  Patient did not attend group.    Therapeutic Modalities Cognitive Behavioral Therapy Motivational Interviewing  Perrin Smack 07/20/2023  1:52 PM

## 2023-07-20 NOTE — BHH Suicide Risk Assessment (Signed)
BHH INPATIENT:  Family/Significant Other Suicide Prevention Education  Suicide Prevention Education:  Education Completed; guardian, [name], [phone] ,  (name of family member/significant other) has been identified by the patient as the family member/significant other with whom the patient will be residing, and identified as the person(s) who will aid the patient in the event of a mental health crisis (suicidal ideations/suicide attempt).  With written consent from the patient, the family member/significant other has been provided the following suicide prevention education, prior to the and/or following the discharge of the patient.  The suicide prevention education provided includes the following: Suicide risk factors Suicide prevention and interventions National Suicide Hotline telephone number [facility] assessment telephone number [facility] Emergency Assistance 911 [facility] and/or Residential Mobile Crisis Unit telephone number  Request made of family/significant other to: Remove weapons (e.g., guns, rifles, knives), all items previously/currently identified as safety concern.   Remove drugs/medications (over-the-counter, prescriptions, illicit drugs), all items previously/currently identified as a safety concern.  The family member/significant other verbalizes understanding of the suicide prevention education information provided.  The family member/significant other agrees to remove the items of safety concern listed above.  Harden Mo 07/20/2023, 4:03 PM
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
## 2023-07-20 NOTE — Plan of Care (Signed)
  Problem: Education: Goal: Knowledge of [facility] Education information/materials will improve Outcome: Progressing Goal: Emotional status will improve Outcome: Progressing   

## 2023-07-20 NOTE — Inpatient Diabetes Management (Signed)
Inpatient Diabetes Program Recommendations  AACE/ADA: New Consensus Statement on Inpatient Glycemic Control   Target Ranges:  Prepandial:   less than 140 mg/dL      Peak postprandial:   less than 180 mg/dL (1-2 hours)      Critically ill patients:  140 - 180 mg/dL    Latest Reference Range & Units 07/04/23 18:47 07/11/23 22:22 07/17/23 21:55  Glucose 70 - 99 mg/dL 098 (H) 119 (H) 147 (H)    Review of Glycemic Control  Admitted to [facility] with: Major depressive disorder, Suicidal ideations      Consult received for this patient Her Blood glucose are high started on Abilify IM, concerned about weigh gain     Recommend the following: 1. Inpatient - Consider changing from Regular to Carb Modified diet.  Could check CBG QAM fasting   2. Start Metformin 500 mg daily   3. Encourage patient to follow up with PCP regarding concerns about hyperglycemia and weight gain.  Thanks, Orlando Penner, RN, MSN, CDCES Diabetes Coordinator Inpatient Diabetes Program [phone] (Team Pager from 8am to 5pm)

## 2023-07-20 NOTE — Group Note (Signed)
Date:  07/20/2023 Time:  8:39 PM  Group Topic/Focus:  Wellness Toolbox:   The focus of this group is to discuss various aspects of wellness, balancing those aspects and exploring ways to increase the ability to experience wellness.  Patients will create a wellness toolbox for use upon discharge.    Participation Level:  Active  Participation Quality:  Appropriate, Sharing, and Supportive  Affect:  Appropriate  Cognitive:  Appropriate  Insight: Appropriate and Good  Engagement in Group:  Engaged  Modes of Intervention:  Discussion  Additional Comments:     [name] 07/20/2023, 8:39 PM

## 2023-07-20 NOTE — Group Note (Signed)
Date:  07/20/2023 Time:  10:27 AM  Group Topic/Focus:  Goals Group:   The focus of this group is to help patients establish daily goals to achieve during treatment and discuss how the patient can incorporate goal setting into their daily lives to aide in recovery.    Participation Level:  Did Not Attend   Lynelle Smoke [facility] 07/20/2023, 10:27 AM

## 2023-07-21 ENCOUNTER — Inpatient Hospital Stay: Payer: MEDICAID

## 2023-07-21 DIAGNOSIS — F331 Major depressive disorder, recurrent, moderate: Secondary | ICD-10-CM | POA: Diagnosis not present

## 2023-07-21 DIAGNOSIS — K645 Perianal venous thrombosis: Secondary | ICD-10-CM | POA: Diagnosis not present

## 2023-07-21 DIAGNOSIS — R1084 Generalized abdominal pain: Secondary | ICD-10-CM | POA: Diagnosis not present

## 2023-07-21 LAB — URINALYSIS, COMPLETE (UACMP) WITH MICROSCOPIC
Bilirubin Urine: NEGATIVE
Glucose, UA: NEGATIVE mg/dL
Hgb urine dipstick: NEGATIVE
Ketones, ur: 5 mg/dL — AB
Nitrite: POSITIVE — AB
Protein, ur: 100 mg/dL — AB
Specific Gravity, Urine: 1.036 — ABNORMAL HIGH (ref 1.005–1.030)
pH: 5 (ref 5.0–8.0)

## 2023-07-21 LAB — GLUCOSE, CAPILLARY
Glucose-Capillary: 93 mg/dL (ref 70–99)
Glucose-Capillary: 99 mg/dL (ref 70–99)
Glucose-Capillary: 125 mg/dL — ABNORMAL HIGH (ref 70–99)
Glucose-Capillary: 114 mg/dL — ABNORMAL HIGH (ref 70–99)

## 2023-07-21 MED ORDER — SULFAMETHOXAZOLE-TRIMETHOPRIM 800-160 MG PO TABS
1.0000 | ORAL_TABLET | Freq: Two times a day (BID) | ORAL | Status: AC
  Administered 2023-07-21 – 2023-07-24 (×6): 1 via ORAL
  Filled 2023-07-21 (×6): qty 1

## 2023-07-21 NOTE — Progress Notes (Signed)
HOSPITALIST CONSULT NOTE    Crystal Black  NWG:[id number] DOB: 1995-12-27 DOA: 07/18/2023 PCP: Alease Medina, MD  No chief complaint on file.   Hospital Course:  Crystal Black is 28 y.o. female with HIV, asthma, and carries multiple psychiatric diagnoses including: ADHD, anxiety, PTSD, borderline personality disorder, polysubstance abuse, bipolar disorder, MDD, who presented to [facility] with suicidal ideation.  Patient was recently discharged from inpatient psychiatry on 1/14.  She was admitted to psychiatry service and medicine consult was requested.  Patient reports nausea, vomiting, diarrhea, and rectal cyst.  She also endorses abdominal pain in the upper abdomen as well as bright red blood per rectum.  Subjective: No acute events overnight. On evaluation today patient reports no further issues with vomiting, still has some nausea.  Has been able to tolerate better p.o. intake.  Still endorsing some vague suprapubic pain.   Objective: Vitals:   07/19/23 1715 07/20/23 0637 07/20/23 1647 07/21/23 0624  BP: 100/68 (!) 95/52 105/77 91/68  Pulse: (!) 112 72 90 79  Resp:  18  18  Temp:  (!) 97.2 F (36.2 C) (!) 97.5 F (36.4 C) (!) 97.1 F (36.2 C)  TempSrc:      SpO2: 99% 96% 100% 98%  Weight:      Height:        Intake/Output Summary (Last 24 hours) at 07/21/2023 0926 Last data filed at 07/20/2023 1009 Gross per 24 hour  Intake 237 ml  Output --  Net 237 ml   Filed Weights   07/18/23 1300  Weight: 86.6 kg    Examination: General exam: Appears calm and comfortable, NAD  Respiratory system: No work of breathing, symmetric chest wall expansion Cardiovascular system: S1 & S2 heard, RRR.  Gastrointestinal system: Abdomen is nondistended, soft and mildly tender to palpation in suprapubic region.   Neuro: Alert and oriented. No focal neurological deficits. Extremities: Symmetric, expected ROM Skin: No rashes, lesions Psychiatry: Mood & affect appropriate for situation.    Assessment & Plan:  Principal Problem:   MDD (major depressive disorder) Active Problems:   Thrombosed hemorrhoids   Abdominal pain   HIV disease (HCC)    Thrombosed hemorrhoids - Continue with topical Anusol twice daily - General Surgery consulted, hemorrhoid appears to have spontaneously drained.  No need for acute surgical intervention at this time - Avoid constipation  Abdominal pain - CT abdomen: Hepatic steatosis, normal gallbladder with no distention, moderate colonic stool burden - Unclear etiology of persistent pain, will proceed with transvaginal ultrasound to better evaluate.  Have discussed this with the patient who is amendable to this exam - Maalox as needed - Continue bowel regimen -- May be 2/2 to UTI  UTI - UA 1/14 unremarkable - Repeat UA 1/24 with positive nitrites and leuks.  Will initiate Bactrim.  Send urine for culture. History of E. coli UTI with sensitivity on record  Hepatic steatosis - Will need close outpatient follow-up for risk factor stratification and surveillance.  HIV - Resume home meds  Suicidal ideation, major depressive disorder, anxiety - Currently admitted to behavioral health unit.  Will defer psychiatric medication management to behavioral health team.     DVT prophylaxis: Ambulation   Code Status: Full Code Family Communication: None at bedside  Antimicrobials:  Anti-infectives (From admission, onward)    Start     Dose/Rate Route Frequency Ordered Stop   07/20/23 0800  darunavir-cobicistat (PREZCOBIX) 800-150 MG per tablet 1 tablet        1 tablet Oral Daily  with breakfast 07/19/23 1633     07/19/23 1730  dolutegravir (TIVICAY) tablet 50 mg        50 mg Oral Daily 07/19/23 1633         Data Reviewed: I have personally reviewed following labs and imaging studies CBC: Recent Labs  Lab 07/17/23 2155 07/19/23 0805  WBC 10.5  --   HGB 12.6 13.2  HCT 37.1 37.9  MCV 95.6  --   PLT 237  --    Basic Metabolic  Panel: Recent Labs  Lab 07/17/23 2155  NA 135  K 3.8  CL 103  CO2 20*  GLUCOSE 121*  BUN 16  CREATININE 0.98  CALCIUM 8.8*   GFR: Estimated Creatinine Clearance: 90.8 mL/min (by C-G formula based on SCr of 0.98 mg/dL). Liver Function Tests: Recent Labs  Lab 07/17/23 2155  AST 22  ALT 26  ALKPHOS 42  BILITOT 0.6  PROT 7.9  ALBUMIN 4.1   CBG: Recent Labs  Lab 07/20/23 1157 07/20/23 1621 07/21/23 0630 07/21/23 0741  GLUCAP 101* 93 99 125*    Recent Results (from the past 240 hours)  Resp panel by RT-PCR (RSV, Flu A&B, Covid) Anterior Nasal Swab     Status: None   Collection Time: 07/11/23 10:22 PM   Specimen: Anterior Nasal Swab  Result Value Ref Range Status   SARS Coronavirus 2 by RT PCR NEGATIVE NEGATIVE Final    Comment: (NOTE) SARS-CoV-2 target nucleic acids are NOT DETECTED.  The SARS-CoV-2 RNA is generally detectable in upper respiratory specimens during the acute phase of infection. The lowest concentration of SARS-CoV-2 viral copies this assay can detect is 138 copies/mL. A negative result does not preclude SARS-Cov-2 infection and should not be used as the sole basis for treatment or other patient management decisions. A negative result may occur with  improper specimen collection/handling, submission of specimen other than nasopharyngeal swab, presence of viral mutation(s) within the areas targeted by this assay, and inadequate number of viral copies(<138 copies/mL). A negative result must be combined with clinical observations, patient history, and epidemiological information. The expected result is Negative.  Fact Sheet for Patients:  BloggerCourse.com  Fact Sheet for Healthcare Providers:  SeriousBroker.it  This test is no t yet approved or cleared by the [place] FDA and  has been authorized for detection and/or diagnosis of SARS-CoV-2 by FDA under an Emergency Use Authorization (EUA).  This EUA will remain  in effect (meaning this test can be used) for the duration of the COVID-19 declaration under Section 564(b)(1) of the Act, 21 U.S.C.section 360bbb-3(b)(1), unless the authorization is terminated  or revoked sooner.       Influenza A by PCR NEGATIVE NEGATIVE Final   Influenza B by PCR NEGATIVE NEGATIVE Final    Comment: (NOTE) The Xpert Xpress SARS-CoV-2/FLU/RSV plus assay is intended as an aid in the diagnosis of influenza from Nasopharyngeal swab specimens and should not be used as a sole basis for treatment. Nasal washings and aspirates are unacceptable for Xpert Xpress SARS-CoV-2/FLU/RSV testing.  Fact Sheet for Patients: BloggerCourse.com  Fact Sheet for Healthcare Providers: SeriousBroker.it  This test is not yet approved or cleared by the [place] FDA and has been authorized for detection and/or diagnosis of SARS-CoV-2 by FDA under an Emergency Use Authorization (EUA). This EUA will remain in effect (meaning this test can be used) for the duration of the COVID-19 declaration under Section 564(b)(1) of the Act, 21 U.S.C. section 360bbb-3(b)(1), unless the authorization is terminated or  revoked.     Resp Syncytial Virus by PCR NEGATIVE NEGATIVE Final    Comment: (NOTE) Fact Sheet for Patients: BloggerCourse.com  Fact Sheet for Healthcare Providers: SeriousBroker.it  This test is not yet approved or cleared by the [place] FDA and has been authorized for detection and/or diagnosis of SARS-CoV-2 by FDA under an Emergency Use Authorization (EUA). This EUA will remain in effect (meaning this test can be used) for the duration of the COVID-19 declaration under Section 564(b)(1) of the Act, 21 U.S.C. section 360bbb-3(b)(1), unless the authorization is terminated or revoked.  Performed at [facility], [address]  [place], [place] [address]   Group A Strep by PCR [facility] Only)     Status: None   Collection Time: 07/11/23 10:22 PM   Specimen: Throat; Sterile Swab  Result Value Ref Range Status   Group A Strep by PCR NOT DETECTED NOT DETECTED Final    Comment: Performed at [facility], [address] [place], [place] [address]     Radiology Studies: No results found.   Scheduled Meds:  benztropine  1 mg Oral Daily   darunavir-cobicistat  1 tablet Oral Q breakfast   divalproex  250 mg Oral Daily   docusate sodium  100 mg Oral Daily   dolutegravir  50 mg Oral Daily   famotidine  20 mg Oral Daily   feeding supplement (GLUCERNA SHAKE)  237 mL Oral TID BM   FLUoxetine  40 mg Oral Daily   hydrocortisone   Rectal BID   influenza vac split trivalent PF  0.5 mL Intramuscular Tomorrow-1000   lactulose  10 g Oral BID   linaclotide  290 mcg Oral QAC breakfast   melatonin  5 mg Oral QHS   methocarbamol  500 mg Oral QHS   montelukast  5 mg Oral QHS   nicotine  14 mg Transdermal Daily   Continuous Infusions:   LOS: 3 days    Debarah Crape, DO [facility]  To contact the attending physician between 7A-7P please use Epic Chat. To contact the covering physician during after hours 7P-7A, please review Amion.   07/21/2023, 9:26 AM   *This document has been created with the assistance of dictation software. Please excuse typographical errors. *

## 2023-07-21 NOTE — Progress Notes (Signed)
Patient denies SI/HI/AH/VH. Patient reports her main concern is not returning to her prior group home and abdominal pain. Patient went for ultrasound today- reports she was told she "may have some stool backed up" during her scan that may be causing her pain. Patient requesting PRN medication for constipation- given as ordered.
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
## 2023-07-21 NOTE — Progress Notes (Signed)
Patient with somatic complaints. Prn given for abdominal pain with good relief. Visible in milieu, socializing with peers. Denies SI, HI, aVH. Does not want to return to group home. Childlike.  Encouragement and support provided. Safety checks maintained. Meds given as prescribed. Pt receptive and remains safe on unit with q 15 min checks.

## 2023-07-21 NOTE — Plan of Care (Signed)
Problem: Education: Goal: Knowledge of [facility] Education information/materials will improve Outcome: Progressing Goal: Emotional status will improve Outcome: Progressing Goal: Mental status will improve Outcome: Progressing Goal: Verbalization of understanding the information provided will improve Outcome: Progressing

## 2023-07-21 NOTE — Group Note (Signed)
Date:  07/21/2023 Time:  4:08 PM  Group Topic/Focus:  Activity Group: The focus of the group is to promote activity for the patients and encourage them to go outside to the courtyard for fresh air and exercise.    Participation Level:  Active  Participation Quality:  Appropriate  Affect:  Appropriate  Cognitive:  Appropriate  Insight: Appropriate  Engagement in Group:  Engaged  Modes of Intervention:  Activity  Additional Comments:    Mary Sella Quention Mcneill 07/21/2023, 4:08 PM

## 2023-07-21 NOTE — Progress Notes (Signed)
   07/21/23 1500  Spiritual Encounters  Type of Visit Follow up  Care provided to: Patient  Conversation partners present during encounter Nurse  OnCall Visit No   Patient requested Spiritual Resources and Chaplain provided.    Rev. Rana M. Earlene Plater, MDiv.  Chaplain Resident  [facility]

## 2023-07-21 NOTE — Group Note (Signed)
Recreation Therapy Group Note   Group Topic:Leisure Education  Group Date: 07/21/2023 Start Time: 1000 End Time: 1100 Facilitators: Rosina Lowenstein, LRT, CTRS Location:  Craft Room  Group Description: Leisure. Patients were given the option to choose from singing karaoke, coloring mandalas, using oil pastels, journaling, or playing with play-doh. LRT and pts discussed the meaning of leisure, the importance of participating in leisure during their free time/when they're outside of the hospital, as well as how our leisure interests can also serve as coping skills.   Goal Area(s) Addressed:  Patient will identify a current leisure interest.  Patient will learn the definition of "leisure". Patient will practice making a positive decision. Patient will have the opportunity to try a new leisure activity. Patient will communicate with peers and LRT.    Affect/Mood: N/A   Participation Level: Did not attend    Clinical Observations/Individualized Feedback: Patient did not attend group.   Plan: Continue to engage patient in RT group sessions 2-3x/week.   Rosina Lowenstein, LRT, CTRS 07/21/2023 12:55 PM

## 2023-07-21 NOTE — Progress Notes (Signed)
MHT accompanying patient to ultrasound at this time.
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
## 2023-07-21 NOTE — Plan of Care (Signed)
Problem: Education: Goal: Emotional status will improve Outcome: Progressing Goal: Mental status will improve Outcome: Progressing Goal: Verbalization of understanding the information provided will improve Outcome: Progressing   Problem: Activity: Goal: Interest or engagement in activities will improve Outcome: Progressing Goal: Sleeping patterns will improve Outcome: Progressing

## 2023-07-21 NOTE — BHH Counselor (Signed)
CSW left HIPAA compliant voicemail with patient's guardian with [facility], [name], [phone].  [name], MSW, LCSW 07/21/2023 3:58 PM
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
## 2023-07-21 NOTE — Progress Notes (Signed)
Recreation Therapy Notes  Patient came up to LRT in the dayroom and whispered: "Me and her are dating...". Pt was referring to and pointed to another female pt on the unit. Pt shared that she only told one tech and then LRT. Pt shared that she spoke with the female patients mother on the phone.   RN and MHT aware.   [address] LRT, CTRS Rosina Lowenstein 07/21/2023 5:56 PM

## 2023-07-21 NOTE — Progress Notes (Signed)
This Clinical research associate relayed the message to patient, from her guardian, to not contact Ms. Garnetta. This Clinical research associate also told patient that if she had any questions to call her guardian. Patient verbalized understanding and went back to working on some coloring pages, in the dayroom. Patient remains safe on the unit and will continue to be monitored.

## 2023-07-21 NOTE — Progress Notes (Signed)
Towards end of this RN shift, patient became upset due to another patient. Patient appears agitated and irritable. Verbal deescalating attempted. Patient requiring PRN medication at this time to prevent further escalation- reference MAR.

## 2023-07-21 NOTE — Group Note (Signed)
Date:  07/21/2023 Time:  10:22 AM  Group Topic/Focus:  Early Warning Signs:   The focus of this group is to help patients identify signs or symptoms they exhibit before slipping into an unhealthy state or crisis.    Participation Level:  Did Not Attend   Mary Sella Kavita Bartl 07/21/2023, 10:22 AM

## 2023-07-22 DIAGNOSIS — K645 Perianal venous thrombosis: Secondary | ICD-10-CM | POA: Diagnosis not present

## 2023-07-22 DIAGNOSIS — R1084 Generalized abdominal pain: Secondary | ICD-10-CM | POA: Diagnosis not present

## 2023-07-22 DIAGNOSIS — F331 Major depressive disorder, recurrent, moderate: Secondary | ICD-10-CM | POA: Diagnosis not present

## 2023-07-22 LAB — CBC WITH DIFFERENTIAL/PLATELET
Abs Immature Granulocytes: 0.01 10*3/uL (ref 0.00–0.07)
Basophils Absolute: 0.1 10*3/uL (ref 0.0–0.1)
Basophils Relative: 1 %
Eosinophils Absolute: 0.1 10*3/uL (ref 0.0–0.5)
Eosinophils Relative: 2 %
HCT: 37.5 % (ref 36.0–46.0)
Hemoglobin: 12.9 g/dL (ref 12.0–15.0)
Immature Granulocytes: 0 %
Lymphocytes Relative: 43 %
Lymphs Abs: 2.5 10*3/uL (ref 0.7–4.0)
MCH: 32 pg (ref 26.0–34.0)
MCHC: 34.4 g/dL (ref 30.0–36.0)
MCV: 93.1 fL (ref 80.0–100.0)
Monocytes Absolute: 0.4 10*3/uL (ref 0.1–1.0)
Monocytes Relative: 7 %
Neutro Abs: 2.7 10*3/uL (ref 1.7–7.7)
Neutrophils Relative %: 47 %
Platelets: 280 10*3/uL (ref 150–400)
RBC: 4.03 MIL/uL (ref 3.87–5.11)
RDW: 12.8 % (ref 11.5–15.5)
WBC: 5.8 10*3/uL (ref 4.0–10.5)
nRBC: 0 % (ref 0.0–0.2)

## 2023-07-22 MED ORDER — LORAZEPAM 1 MG PO TABS
1.0000 mg | ORAL_TABLET | ORAL | Status: AC
  Administered 2023-07-22: 1 mg via ORAL
  Filled 2023-07-22: qty 1

## 2023-07-22 NOTE — Progress Notes (Signed)
HOSPITALIST CONSULT NOTE    Crystal Black  WUJ:[id number] DOB: 03-06-1996 DOA: 07/18/2023 PCP: Alease Medina, MD  No chief complaint on file.   Hospital Course:  Crystal Black is 28 y.o. female with HIV, asthma, and carries multiple psychiatric diagnoses including: ADHD, anxiety, PTSD, borderline personality disorder, polysubstance abuse, bipolar disorder, MDD, who presented to [facility] with suicidal ideation.  Patient was recently discharged from inpatient psychiatry on 1/14.  She was admitted to psychiatry service and medicine consult was requested.  Patient reports nausea, vomiting, diarrhea, and rectal cyst.  She also endorses abdominal pain in the upper abdomen as well as bright red blood per rectum.  Subjective: No acute events overnight. On evaluation today patient complains of diarrhea and vomiting.  Bedside RN and RT report no evidence of this.  They report she was complaining of constipation earlier today and has been taking lactulose.   Objective: Vitals:   07/20/23 1647 07/21/23 0624 07/21/23 1600 07/22/23 1630  BP: 105/77 91/68 100/69 97/65  Pulse: 90 79 95 (!) 112  Resp:  18    Temp: (!) 97.5 F (36.4 C) (!) 97.1 F (36.2 C) 98.1 F (36.7 C) 98.1 F (36.7 C)  TempSrc:      SpO2: 100% 98% 99% 97%  Weight:      Height:       No intake or output data in the 24 hours ending 07/22/23 1813  Filed Weights   07/18/23 1300  Weight: 86.6 kg    Examination: General exam: Appears calm and comfortable, NAD  Respiratory system: No work of breathing, symmetric chest wall expansion Cardiovascular system: S1 & S2 heard, RRR.  Gastrointestinal system: Abdomen is nondistended, soft and mildly tender to palpation in suprapubic region.   Neuro: Alert and oriented. No focal neurological deficits. Extremities: Symmetric, expected ROM Skin: No rashes, lesions Psychiatry: Mood & affect appropriate for situation.   Assessment & Plan:  Principal Problem:   MDD (major depressive  disorder) Active Problems:   Thrombosed hemorrhoids   Abdominal pain   HIV disease (HCC)    Thrombosed hemorrhoids - Continue with topical Anusol twice daily - General Surgery consulted, hemorrhoid appears to have spontaneously drained.  No need for acute surgical intervention at this time - Avoid constipation  Abdominal pain - CT abdomen: Hepatic steatosis, normal gallbladder with no distention, moderate colonic stool burden - Unclear etiology of persistent pain, will proceed with transvaginal ultrasound to better evaluate.  Have discussed this with the patient who is amendable to this exam - Maalox as needed - Continue bowel regimen -- May be 2/2 to UTI  UTI - UA 1/14 unremarkable - Repeat UA 1/24 with positive nitrites and leuks. -- Cont Bactrim.  Send urine for culture. History of E. coli UTI with sensitivity on record  Hepatic steatosis - Will need close outpatient follow-up for risk factor stratification and surveillance.  HIV - Resume home meds  Suicidal ideation, major depressive disorder, anxiety - Currently admitted to behavioral health unit.  Will defer psychiatric medication management to behavioral health team.     DVT prophylaxis: Ambulation   Code Status: Full Code Family Communication: None at bedside  Antimicrobials:  Anti-infectives (From admission, onward)    Start     Dose/Rate Route Frequency Ordered Stop   07/21/23 2000  sulfamethoxazole-trimethoprim (BACTRIM DS) 800-160 MG per tablet 1 tablet        1 tablet Oral Every 12 hours 07/21/23 1819 07/26/23 1959   07/20/23 0800  darunavir-cobicistat (PREZCOBIX) 800-150  MG per tablet 1 tablet        1 tablet Oral Daily with breakfast 07/19/23 1633     07/19/23 1730  dolutegravir (TIVICAY) tablet 50 mg        50 mg Oral Daily 07/19/23 1633         Data Reviewed: I have personally reviewed following labs and imaging studies CBC: Recent Labs  Lab 07/17/23 2155 07/19/23 0805  WBC 10.5  --   HGB  12.6 13.2  HCT 37.1 37.9  MCV 95.6  --   PLT 237  --    Basic Metabolic Panel: Recent Labs  Lab 07/17/23 2155  NA 135  K 3.8  CL 103  CO2 20*  GLUCOSE 121*  BUN 16  CREATININE 0.98  CALCIUM 8.8*   GFR: Estimated Creatinine Clearance: 90.8 mL/min (by C-G formula based on SCr of 0.98 mg/dL). Liver Function Tests: Recent Labs  Lab 07/17/23 2155  AST 22  ALT 26  ALKPHOS 42  BILITOT 0.6  PROT 7.9  ALBUMIN 4.1   CBG: Recent Labs  Lab 07/20/23 1157 07/20/23 1621 07/21/23 0630 07/21/23 0741 07/21/23 1143  GLUCAP 101* 93 99 125* 114*    Recent Results (from the past 240 hours)  Urine Culture (for pregnant, neutropenic or urologic patients or patients with an indwelling urinary catheter)     Status: Abnormal (Preliminary result)   Collection Time: 07/21/23  4:29 PM   Specimen: Urine, Clean Catch  Result Value Ref Range Status   Specimen Description   Final    URINE, CLEAN CATCH Performed at [facility], [address] [place], [place] [address]    Special Requests   Final    NONE Performed at [facility], [address] [place], [place] [address]    Culture (A)  Final    >=100,000 COLONIES/mL ESCHERICHIA COLI SUSCEPTIBILITIES TO FOLLOW Performed at [facility], [address] [place], [place] [address]    Report Status PENDING  Incomplete     Radiology Studies: US PELVIC COMPLETE W TRANSVAGINAL AND TORSION R/O Result Date: 07/21/2023 CLINICAL DATA:  Lower abdominal pain and nausea for the past 2 months. EXAM: TRANSABDOMINAL AND TRANSVAGINAL ULTRASOUND OF PELVIS DOPPLER ULTRASOUND OF OVARIES TECHNIQUE: Both transabdominal and transvaginal ultrasound examinations of the pelvis were performed. Transabdominal technique was performed for global imaging of the pelvis including uterus, ovaries, adnexal regions, and pelvic cul-de-sac. It was necessary to proceed with endovaginal exam following the transabdominal exam to visualize the  ovaries. Color and duplex Doppler ultrasound was utilized to evaluate blood flow to the ovaries. COMPARISON:  CT abdomen pelvis dated July 19, 2023. FINDINGS: Uterus Measurements: 4.2 x 1.6 x 3.8 cm = volume: 13 mL. No fibroids or other mass visualized. Endometrium Thickness: 4 mm. Small amount of fluid within the endometrial canal at the fundus. No focal abnormality visualized. Right ovary Measurements: 2.4 x 1.3 x 1.3 cm = volume: 2.0 mL. Normal appearance/no adnexal mass. Left ovary Measurements: 3.5 x 1.4 x 2.1 cm = volume: 5.2 mL. Normal appearance/no adnexal mass. Pulsed Doppler evaluation of both ovaries demonstrates normal low-resistance arterial and venous waveforms. Other findings Trace free fluid in the pelvis, likely physiologic. IMPRESSION: 1. No acute abnormality. No evidence of ovarian torsion. Electronically Signed   By: Obie Dredge M.D.   On: 07/21/2023 15:00     Scheduled Meds:  benztropine  1 mg Oral Daily   darunavir-cobicistat  1 tablet Oral Q breakfast   divalproex  250 mg Oral Daily   docusate sodium  100 mg Oral Daily   dolutegravir  50 mg Oral Daily   famotidine  20 mg Oral Daily   feeding supplement (GLUCERNA SHAKE)  237 mL Oral TID BM   FLUoxetine  40 mg Oral Daily   hydrocortisone   Rectal BID   influenza vac split trivalent PF  0.5 mL Intramuscular Tomorrow-1000   lactulose  10 g Oral BID   linaclotide  290 mcg Oral QAC breakfast   melatonin  5 mg Oral QHS   methocarbamol  500 mg Oral QHS   montelukast  5 mg Oral QHS   nicotine  14 mg Transdermal Daily   sulfamethoxazole-trimethoprim  1 tablet Oral Q12H   Continuous Infusions:   LOS: 4 days    Debarah Crape, DO [facility]  To contact the attending physician between 7A-7P please use Epic Chat. To contact the covering physician during after hours 7P-7A, please review Amion.   07/22/2023, 6:13 PM   *This document has been created with the assistance of dictation software. Please excuse  typographical errors. *

## 2023-07-22 NOTE — Progress Notes (Signed)
   07/22/23 0500  Psych Admission Type (Psych Patients Only)  Admission Status Voluntary  Psychosocial Assessment  Patient Complaints Anxiety;Crying spells  Eye Contact Brief  Facial Expression Worried  Affect Anxious;Depressed  Speech Slow  Interaction Needy  Motor Activity Slow  Appearance/Hygiene In scrubs  Behavior Characteristics Cooperative;Agitated  Aggressive Behavior  Effect No apparent injury  Thought Chartered certified accountant of ideas  Content Blaming others;Confabulation  Delusions None reported or observed  Perception Derealization  Hallucination None reported or observed  Judgment Impaired  Confusion Mild  Danger to Self  Current suicidal ideation? Verbalizes  Description of Suicide Plan none  Self-Injurious Behavior Some self-injurious ideation observed or expressed.  No lethal plan expressed   Agreement Not to Harm Self Yes  Description of Agreement Verbal  Danger to Others  Danger to Others None reported or observed   Noted with outburst of shouting "I just want to die", also crying during the earlier part of the shift. I was able to give her anti- anxiety meds and she soon calmed down. Also, noted with difficulty sleeping. Crystal Black was up and about walking around until 2:15 am.

## 2023-07-22 NOTE — Plan of Care (Signed)
Problem: Education: Goal: Emotional status will improve Outcome: Progressing Goal: Mental status will improve Outcome: Progressing

## 2023-07-22 NOTE — Group Note (Signed)
Date:  07/22/2023 Time:  8:33 PM  Group Topic/Focus:  Wrap-Up Group:   The focus of this group is to help patients review their daily goal of treatment and discuss progress on daily workbooks.    Participation Level:  Active  Participation Quality:  Appropriate and Attentive  Affect:  Appropriate  Cognitive:  Alert and Appropriate  Insight: Appropriate  Engagement in Group:  Engaged  Modes of Intervention:  Discussion  Additional Comments:     Maglione,Jonerik Sliker E 07/22/2023, 8:33 PM
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
## 2023-07-22 NOTE — Progress Notes (Signed)
Patient admitted to [facility] voluntarily on July 18, 2023 for SI and worsening depression. Patient was recently discharged on January 15 for similar presentation. Patient also has made several complaints against her group home. Social work is very much aware and has provided a detailed history. See SW note on January 23 at 1426 for more info.  Today patient denies SI/HI/AVH. She complains of 7/10 pain on the left side of her bottom. Advil adm at 0853. Upon follow up, pain decreased to a 2. Support and encouragement offered. Medication adm whole without difficulty. Patient is mainly isolative in her room but will appear at times for meals, meds, and to socialize with peers.  Q15 minute unit checks in place.

## 2023-07-22 NOTE — Progress Notes (Signed)
    Signed          07/22/23 0500  Psych Admission Type (Psych Patients Only)  Admission Status Voluntary  Psychosocial Assessment  Patient Complaints Anxiety;Crying spells  Eye Contact Brief  Facial Expression Worried  Affect Anxious;Depressed  Speech Slow  Interaction Needy  Motor Activity Slow  Appearance/Hygiene In scrubs  Behavior Characteristics Cooperative;Agitated  Aggressive Behavior  Effect No apparent injury  Thought Chartered certified accountant of ideas  Content Blaming others;Confabulation  Delusions None reported or observed  Perception Derealization  Hallucination None reported or observed  Judgment Impaired  Confusion Mild  Danger to Self  Current suicidal ideation? Verbalizes  Description of Suicide Plan none  Self-Injurious Behavior Some self-injurious ideation observed or expressed.  No lethal plan expressed   Agreement Not to Harm Self Yes  Description of Agreement Verbal  Danger to Others  Danger to Others None reported or observed    Noted with outburst of shouting "I just want to die", also crying during the earlier part of the shift. I was able to give her anti- anxiety meds and she soon calmed down. Also, noted with difficulty sleeping. Crystal Black was up and about walking around until 2:15 am.

## 2023-07-22 NOTE — Progress Notes (Signed)
CBG monitoring for today has been manually entered into flowsheets.

## 2023-07-22 NOTE — Progress Notes (Signed)
Patient's CBG was 67 prior to dinner, and after eating CBG was 207.

## 2023-07-23 DIAGNOSIS — F331 Major depressive disorder, recurrent, moderate: Secondary | ICD-10-CM | POA: Diagnosis not present

## 2023-07-23 DIAGNOSIS — R1084 Generalized abdominal pain: Secondary | ICD-10-CM | POA: Diagnosis not present

## 2023-07-23 LAB — URINE CULTURE: Culture: >=100000 — AB

## 2023-07-23 MED ORDER — LORAZEPAM 2 MG PO TABS
2.0000 mg | ORAL_TABLET | Freq: Once | ORAL | Status: AC
  Administered 2023-07-23: 2 mg via ORAL
  Filled 2023-07-23: qty 1

## 2023-07-23 NOTE — Plan of Care (Signed)
Problem: Education: Goal: Emotional status will improve Outcome: Progressing Goal: Mental status will improve Outcome: Progressing Goal: Verbalization of understanding the information provided will improve Outcome: Progressing

## 2023-07-23 NOTE — Group Note (Signed)
Date:  07/23/2023 Time:  8:46 PM  Group Topic/Focus:  Wrap-Up Group:   The focus of this group is to help patients review their daily goal of treatment and discuss progress on daily workbooks.    Participation Level:  Active  Participation Quality:  Appropriate and Attentive  Affect:  Appropriate  Cognitive:  Alert and Appropriate  Insight: Appropriate  Engagement in Group:  Limited  Modes of Intervention:  Discussion  Additional Comments:     Maglione,Uri Turnbough E 07/23/2023, 8:46 PM
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
## 2023-07-23 NOTE — Progress Notes (Signed)
   07/23/23 0735  Psych Admission Type (Psych Patients Only)  Admission Status Voluntary  Psychosocial Assessment  Patient Complaints Anxiety  Eye Contact Fair  Facial Expression Anxious  Affect Anxious  Speech Slow  Interaction Assertive  Motor Activity Slow  Appearance/Hygiene In scrubs  Behavior Characteristics Cooperative;Anxious  Mood Anxious  Thought Process  Coherency Concrete thinking  Content Preoccupation  Delusions None reported or observed  Perception Hallucinations  Hallucination Auditory  Judgment Limited  Confusion None  Danger to Self  Current suicidal ideation? Denies  Agreement Not to Harm Self Yes  Description of Agreement Verbal  Danger to Others  Danger to Others None reported or observed

## 2023-07-23 NOTE — Progress Notes (Signed)
  CONSULT SIGN OFF NOTE    Latreece Mochizuki  GNF:[id number] DOB: 1995-09-23 DOA: 07/18/2023 PCP: Alease Medina, MD  No chief complaint on file.   Hospital Course:  Octavie Westerhold is 28 y.o. female with HIV, asthma, and carries multiple psychiatric diagnoses including: ADHD, anxiety, PTSD, borderline personality disorder, polysubstance abuse, bipolar disorder, MDD, who presented to [facility] with suicidal ideation.  Patient was recently discharged from inpatient psychiatry on 1/14.  She was admitted to psychiatry service and medicine consult was requested.  Patient reports nausea, vomiting, diarrhea, and rectal cyst.  She also endorses abdominal pain in the upper abdomen as well as bright red blood per rectum.    Thrombosed hemorrhoids - Continue with topical Anusol twice daily - General Surgery consulted, hemorrhoid appears to have spontaneously drained.  No need for acute surgical intervention at this time - Avoid constipation   Abdominal pain - CT abdomen: Hepatic steatosis, normal gallbladder with no distention, moderate colonic stool burden - TVUS unremarkable - Maalox as needed - Continue bowel regimen -- May be 2/2 to UTI   UTI - UA 1/14 unremarkable - Repeat UA 1/24, urine culture + Ecoli  Hepatic steatosis - Will need close outpatient follow-up for risk factor stratification and surveillance.   HIV - Resume home meds   Suicidal ideation, major depressive disorder, anxiety - Currently admitted to behavioral health unit.  Will defer psychiatric medication management to behavioral health team.    Subjective: Urine culture with pansensistive E Coli. Pt should complete 3 days bactrim, as prescribed. TRH will sign off, do not hesitate to reengage [place] as needed.  Thank you for this consult

## 2023-07-23 NOTE — Group Note (Signed)
Date:  07/23/2023 Time:  11:34 AM  Group Topic/Focus:  Goals Group:   The focus of this group is to help patients establish daily goals to achieve during treatment and discuss how the patient can incorporate goal setting into their daily lives to aide in recovery. Healthy Communication:   The focus of this group is to discuss communication, barriers to communication, as well as healthy ways to communicate with others.    Participation Level:  Minimal  Participation Quality:  Appropriate  Affect:  Appropriate  Cognitive:  Appropriate  Insight: Appropriate  Engagement in Group:  Poor  Modes of Intervention:  Discussion, Education, and Support  Additional Comments:    Wilford Corner 07/23/2023, 11:34 AM

## 2023-07-23 NOTE — Plan of Care (Signed)
Problem: Education: Goal: Emotional status will improve Outcome: Progressing   Problem: Education: Goal: Mental status will improve Outcome: Progressing

## 2023-07-23 NOTE — Progress Notes (Signed)
Nursing Shift Note:  1900-0700  Attended Evening Group: active and engaged Medication Compliant:  yes Behavior: cooperative Sleep Quality: good Significant Changes: none   07/23/23 0000  Psych Admission Type (Psych Patients Only)  Admission Status Voluntary  Psychosocial Assessment  Patient Complaints Other (Comment)  Eye Contact Fair  Facial Expression Worried  Affect Appropriate to circumstance  Speech Slow  Interaction No initiation  Motor Activity Slow  Appearance/Hygiene In scrubs  Behavior Characteristics Cooperative  Mood Sad  Thought Chartered certified accountant of ideas  Content Blaming others  Delusions None reported or observed  Perception WDL  Hallucination None reported or observed  Judgment Impaired  Confusion Mild  Danger to Self  Current suicidal ideation? Denies  Agreement Not to Harm Self Yes  Description of Agreement verbal.  Danger to Others  Danger to Others None reported or observed

## 2023-07-23 NOTE — Group Note (Signed)
Date:  07/23/2023 Time:  6:40 PM  Group Topic/Focus:  Individual therapy ;  to increase understanding of one's thought and behavior patterns to help increase function and well-being. Art Therapy ; to help people explore emotions, develop self-awareness, cope with stress, boost self-esteem, and work on Pharmacist, community.   Participation Level:  Active  Participation Quality:  Appropriate  Affect:  Appropriate  Cognitive:  Appropriate  Insight: Appropriate  Engagement in Group:  Supportive  Modes of Intervention:  Activity, Discussion, and Education  Additional Comments:    Rosaura Carpenter 07/23/2023, 6:40 PM

## 2023-07-24 DIAGNOSIS — F331 Major depressive disorder, recurrent, moderate: Secondary | ICD-10-CM | POA: Diagnosis not present

## 2023-07-24 LAB — GLUCOSE, CAPILLARY
Glucose-Capillary: 191 mg/dL — ABNORMAL HIGH (ref 70–99)
Glucose-Capillary: 116 mg/dL — ABNORMAL HIGH (ref 70–99)
Glucose-Capillary: 67 mg/dL — ABNORMAL LOW (ref 70–99)
Glucose-Capillary: 207 mg/dL — ABNORMAL HIGH (ref 70–99)
Glucose-Capillary: 101 mg/dL — ABNORMAL HIGH (ref 70–99)
Glucose-Capillary: 139 mg/dL — ABNORMAL HIGH (ref 70–99)
Glucose-Capillary: 118 mg/dL — ABNORMAL HIGH (ref 70–99)
Glucose-Capillary: 90 mg/dL (ref 70–99)
Glucose-Capillary: 143 mg/dL — ABNORMAL HIGH (ref 70–99)

## 2023-07-24 NOTE — Group Note (Signed)
Recreation Therapy Group Note   Group Topic:Healthy Support Systems  Group Date: 07/24/2023 Start Time: 1000 End Time: 1040 Facilitators: Rosina Lowenstein, LRT, CTRS Location:  Craft Room  Group Description: Straw Bridge. In groups or individually, patients were given 10 plastic drinking straws and an equal length of masking tape. Using the materials provided, patients were instructed to build a free-standing bridge-like structure to suspend an everyday item (ex: deck of cards) off the floor or table surface. 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 LRT facilitated post-activity discussion reviewing the importance of having strong and healthy support systems in our lives. LRT discussed how the people in our lives serve as the tape and the deck of cards we placed on top of our straw structure are the stressors we face in daily life. LRT and pts discussed what happens in our life when things get too heavy for [place], and we don't have strong supports outside of the hospital. Pt shared 2 of their healthy supports in their life aloud in the group.   Goal Area(s) Addressed:  Patient will identify 2 healthy supports in their life. Patient will identify skills to successfully complete activity. Patient will identify correlation of this activity to life post-discharge.  Patient will build on frustration tolerance skills. Patient will increase team building and communication skills.    Affect/Mood: N/A   Participation Level: Did not attend    Clinical Observations/Individualized Feedback: Patient did not attend group.   Plan: Continue to engage patient in RT group sessions 2-3x/week.   Rosina Lowenstein, LRT, CTRS 07/24/2023 11:51 AM

## 2023-07-24 NOTE — Group Note (Signed)
[facility] LCSW Group Therapy Note    Group Date: 07/24/2023 Start Time: 1300 End Time: 1345  Type of Therapy and Topic:  Group Therapy:  Overcoming Obstacles  Participation Level:  [facility] PARTICIPATION LEVEL: None   Description of Group:   In this group patients will be encouraged to explore what they see as obstacles to their own wellness and recovery. They will be guided to discuss their thoughts, feelings, and behaviors related to these obstacles. The group will process together ways to cope with barriers, with attention given to specific choices patients can make. Each patient will be challenged to identify changes they are motivated to make in order to overcome their obstacles. This group will be process-oriented, with patients participating in exploration of their own experiences as well as giving and receiving support and challenge from other group members.  Therapeutic Goals: 1. Patient will identify personal and current obstacles as they relate to admission. 2. Patient will identify barriers that currently interfere with their wellness or overcoming obstacles.  3. Patient will identify feelings, thought process and behaviors related to these barriers. 4. Patient will identify two changes they are willing to make to overcome these obstacles:    Summary of Patient Progress Patient came into the group space during the last few moments of class. During this time she did not participate but was appropriate in her behavior.    Therapeutic Modalities:   Cognitive Behavioral Therapy Solution Focused Therapy Motivational Interviewing Relapse Prevention Therapy   Glenis Smoker, LCSW

## 2023-07-24 NOTE — Progress Notes (Signed)
Patient had bright red blood from her rectum. Hx of hemorrhoids. Encouraged patient to rest. PRN given for constipation. Awaiting effectiveness.

## 2023-07-24 NOTE — Progress Notes (Signed)
Patient noted calmer this shift. Denies SI, HI, AVH. Endorses intermittent anxiety. No other complaints or concerns voiced. Noted in dayroom interacting appropriately with staff and peers. Watching tv. Encouragement and support provided. Safety checks maintained. Medications given as prescribed. Pt receptive and remains safe on unit with q 15 min checks.

## 2023-07-24 NOTE — Group Note (Signed)
Recreation Therapy Group Note   Group Topic:General Recreation  Group Date: 07/24/2023 Start Time: 1500 End Time: 1600 Facilitators: Rosina Lowenstein, LRT, CTRS Location:  Courtyard  Group Description: [employer]. LRT and patients played games of basketball, drew with chalk, and played corn hole while outside in the courtyard while getting fresh air and sunlight. Music was being played in the background. LRT and peers conversed about different games they have played before, what they do in their free time and anything else that is on their minds. LRT encouraged pts to drink water after being outside, sweating and getting their heart rate up.  Goal Area(s) Addressed: Patient will build on frustration tolerance skills. Patients will partake in a competitive play game with peers. Patients will gain knowledge of new leisure interest/hobby.    Affect/Mood: N/A   Participation Level: Did not attend    Clinical Observations/Individualized Feedback: Patient did not attend group.   Plan: Continue to engage patient in RT group sessions 2-3x/week.   Rosina Lowenstein, LRT, CTRS 07/24/2023 5:28 PM

## 2023-07-24 NOTE — Progress Notes (Signed)
Patient currently denying SI/HI/AH/VH. Patient reported she became upset last night and had to take medication. She reported she feels better this morning and slept really well last night. Patient told the NP she was having diarrhea. Patient became upset towards the end of this RN's shift due to a "patient being too loud". She reported she asked the other patient to be quiet and the patient told her no which made her very upset. Patient with clenched fist and appears to be visibly upset. Patient asking for medication to help her calm down- PRN's given: reference MAR.

## 2023-07-24 NOTE — Group Note (Signed)
Date:  07/24/2023 Time:  9:18 PM  Group Topic/Focus:  Wrap-Up Group:   The focus of this group is to help patients review their daily goal of treatment and discuss progress on daily workbooks.    Participation Level:  Active  Participation Quality:  Appropriate  Affect:  Appropriate  Cognitive:  Appropriate  Insight: Appropriate  Engagement in Group:  Engaged  Modes of Intervention:  Discussion   Lenore Cordia 07/24/2023, 9:18 PM

## 2023-07-24 NOTE — Progress Notes (Signed)
   07/24/23 0600  Psych Admission Type (Psych Patients Only)  Admission Status Voluntary  Psychosocial Assessment  Patient Complaints Anxiety  Eye Contact Fair  Facial Expression Anxious  Affect Anxious  Speech Slow  Interaction Assertive  Motor Activity Slow  Appearance/Hygiene In scrubs  Behavior Characteristics Cooperative  Mood Anxious  Aggressive Behavior  Effect No apparent injury  Thought Process  Content Preoccupation  Delusions None reported or observed  Perception Hallucinations  Hallucination Auditory  Judgment Limited  Confusion None  Danger to Self  Agreement Not to Harm Self Yes  Danger to Others  Danger to Others None reported or observed

## 2023-07-24 NOTE — Plan of Care (Signed)
Problem: Education: Goal: Knowledge of [facility] Education information/materials will improve Outcome: Progressing Goal: Emotional status will improve Outcome: Progressing

## 2023-07-24 NOTE — Plan of Care (Signed)
Problem: Education: Goal: Emotional status will improve Outcome: Progressing Goal: Mental status will improve Outcome: Progressing   Problem: Activity: Goal: Sleeping patterns will improve Outcome: Progressing   Problem: Coping: Goal: Ability to verbalize frustrations and anger appropriately will improve Outcome: Progressing   Problem: Safety: Goal: Periods of time without injury will increase Outcome: Progressing

## 2023-07-24 NOTE — BH IP Treatment Plan (Addendum)
Interdisciplinary Treatment and Diagnostic Plan Update  07/24/2023 Time of Session: 08:30 Trust Crago MRN: [id number]  Principal Diagnosis: MDD (major depressive disorder)  Secondary Diagnoses: Principal Problem:   MDD (major depressive disorder) Active Problems:   HIV disease (HCC)   Thrombosed hemorrhoids   Abdominal pain   Current Medications:  Current Facility-Administered Medications  Medication Dose Route Frequency Provider Last Rate Last Admin   albuterol (VENTOLIN HFA) 108 (90 Base) MCG/ACT inhaler 2 puff  2 puff Inhalation Q4H PRN [name], NP   2 puff at 07/23/23 0735   alum & mag hydroxide-simeth (MAALOX/MYLANTA) 200-200-20 MG/5ML suspension 30 mL  30 mL Oral Q4H PRN McLauchlin, [name], NP   30 mL at 07/23/23 2107   benztropine (COGENTIN) tablet 1 mg  1 mg Oral Daily [name], NP   1 mg at 07/23/23 0735   darunavir-cobicistat (PREZCOBIX) 800-150 MG per tablet 1 tablet  1 tablet Oral Q breakfast [name], NP   1 tablet at 07/23/23 4098   haloperidol (HALDOL) tablet 5 mg  5 mg Oral TID PRN McLauchlin, [name], NP       And   diphenhydrAMINE (BENADRYL) capsule 50 mg  50 mg Oral TID PRN McLauchlin, [name], NP       haloperidol (HALDOL) tablet 5 mg  5 mg Oral TID PRN [name], NP   5 mg at 07/21/23 2119   And   diphenhydrAMINE (BENADRYL) capsule 50 mg  50 mg Oral TID PRN [name], NP   50 mg at 07/23/23 1552   haloperidol lactate (HALDOL) injection 5 mg  5 mg Intramuscular TID PRN McLauchlin, [name], NP       And   diphenhydrAMINE (BENADRYL) injection 50 mg  50 mg Intramuscular TID PRN McLauchlin, [name], NP       And   LORazepam (ATIVAN) injection 2 mg  2 mg Intramuscular TID PRN McLauchlin, [name], NP       haloperidol lactate (HALDOL) injection 10 mg  10 mg Intramuscular TID PRN McLauchlin, [name], NP       And   diphenhydrAMINE (BENADRYL) injection 50 mg  50 mg Intramuscular TID PRN McLauchlin, [name], NP       And   LORazepam  (ATIVAN) injection 2 mg  2 mg Intramuscular TID PRN McLauchlin, [name], NP       haloperidol lactate (HALDOL) injection 5 mg  5 mg Intramuscular TID PRN [name], NP       And   diphenhydrAMINE (BENADRYL) injection 50 mg  50 mg Intramuscular TID PRN [name], NP       And   LORazepam (ATIVAN) injection 2 mg  2 mg Intramuscular TID PRN [name], NP       haloperidol lactate (HALDOL) injection 10 mg  10 mg Intramuscular TID PRN [name], NP       And   diphenhydrAMINE (BENADRYL) injection 50 mg  50 mg Intramuscular TID PRN [name], NP       And   LORazepam (ATIVAN) injection 2 mg  2 mg Intramuscular TID PRN [name], NP       divalproex (DEPAKOTE ER) 24 hr tablet 250 mg  250 mg Oral Daily [name], NP   250 mg at 07/23/23 0735   docusate sodium (COLACE) capsule 100 mg  100 mg Oral Daily [name], NP   100 mg at 07/23/23 0735   dolutegravir (TIVICAY) tablet 50 mg  50 mg Oral Daily [name], NP   50 mg at 07/23/23 0735   famotidine (PEPCID) tablet 20 mg  20 mg Oral Daily [name], NP   20 mg at 07/23/23 0735   feeding supplement (GLUCERNA SHAKE) (GLUCERNA SHAKE) liquid 237 mL  237 mL Oral TID BM [name], NP   237 mL at 07/23/23 2119   FLUoxetine (PROZAC) capsule 40 mg  40 mg Oral Daily [name], NP   40 mg at 07/23/23 0735   hydrocortisone (ANUSOL-HC) 2.5 % rectal cream   Rectal BID [name], MD   Given at 07/22/23 1727   hydrOXYzine (ATARAX) tablet 50 mg  50 mg Oral Q6H PRN [name], NP   50 mg at 07/23/23 2107   ibuprofen (ADVIL) tablet 600 mg  600 mg Oral Q8H PRN [name], NP   600 mg at 07/23/23 1918   influenza vac split trivalent PF (FLULAVAL) injection 0.5 mL  0.5 mL Intramuscular Tomorrow-1000 [name], MD       linaclotide [name]) capsule 290 mcg  290 mcg Oral QAC breakfast [name], NP   290 mcg at 07/23/23 0810   magnesium hydroxide (MILK OF MAGNESIA)  suspension 30 mL  30 mL Oral Daily PRN McLauchlin, [name], NP   30 mL at 07/21/23 1234   melatonin tablet 5 mg  5 mg Oral QHS [name], NP   5 mg at 07/23/23 2106   methocarbamol (ROBAXIN) tablet 500 mg  500 mg Oral QHS [name], NP   500 mg at 07/23/23 2107   montelukast (SINGULAIR) tablet 5 mg  5 mg Oral QHS [name], [name], DO   5 mg at 07/23/23 2119   nicotine (NICODERM CQ - dosed in mg/24 hours) patch 14 mg  14 mg Transdermal Daily [name], MD       nicotine polacrilex (NICORETTE) gum 2 mg  2 mg Oral PRN [name], NP   2 mg at 07/23/23 1702   ondansetron (ZOFRAN) tablet 4 mg  4 mg Oral Q8H PRN [name], NP   4 mg at 07/22/23 2123   sulfamethoxazole-trimethoprim (BACTRIM DS) 800-160 MG per tablet 1 tablet  1 tablet Oral Q12H [name], [name], DO   1 tablet at 07/23/23 1918   traZODone (DESYREL) tablet 50 mg  50 mg Oral QHS PRN McLauchlin, [name], NP   50 mg at 07/23/23 2106   PTA Medications: Medications Prior to Admission  Medication Sig Dispense Refill Last Dose/Taking   albuterol (VENTOLIN HFA) 108 (90 Base) MCG/ACT inhaler Inhale 2 puffs into the lungs every 4 (four) hours as needed for wheezing or shortness of breath.   Taking As Needed   ARIPiprazole (ABILIFY) 2 MG tablet Take 1 tablet (2 mg total) by mouth daily. 12 tablet 0 07/17/2023 at  8:00 AM   [START ON 08/06/2023] ARIPiprazole ER (ABILIFY MAINTENA) 400 MG SRER injection Inject 1.5 mLs (300 mg total) into the muscle every 28 (twenty-eight) days. 1.5 mL 0 06/30/2023   benztropine (COGENTIN) 1 MG tablet Take 1 tablet (1 mg total) by mouth 2 (two) times daily. 60 tablet 0 07/17/2023 at  8:00 PM   darunavir-cobicistat (PREZCOBIX) 800-150 MG tablet Take 1 tablet by mouth daily with breakfast. Swallow whole. Do NOT crush, break or chew tablets. Take with food. 30 tablet 1 07/17/2023 at  8:00 AM   divalproex (DEPAKOTE ER) 250 MG 24 hr tablet Take 1 tablet (250  mg total) by mouth 2 (two) times daily. 60 tablet 0  07/17/2023 at  8:00 PM   docusate sodium (COLACE) 100 MG capsule Take 300 mg by mouth daily.   07/17/2023 at  8:00 AM   dolutegravir (TIVICAY) 50 MG tablet Take 1 tablet (50 mg total) by mouth daily. 30 tablet 1 07/17/2023 at  8:00 AM   EPINEPHrine 0.3 mg/0.3 mL IJ SOAJ injection Inject 0.3 mg into the muscle as needed for anaphylaxis.   Taking As Needed   famotidine (PEPCID) 20 MG tablet Take 20 mg by mouth 2 (two) times daily.   Taking   FIBER ADULT GUMMIES PO Take 1 tablet by mouth in the morning and at bedtime.   07/17/2023 at  8:00 PM   FLUoxetine (PROZAC) 40 MG capsule Take 40 mg by mouth at bedtime.   07/17/2023 at  8:00 PM   glycerin adult 2 g suppository Place 1 suppository rectally as needed for constipation.   Taking As Needed   hydrOXYzine (ATARAX) 25 MG tablet Take 1 tablet (25 mg total) by mouth 3 (three) times daily as needed. 60 tablet 1 Taking As Needed   ibuprofen (ADVIL) 800 MG tablet Take 1 tablet (800 mg total) by mouth every 8 (eight) hours as needed for moderate pain. 15 tablet 0 Taking As Needed   ketoconazole (NIZORAL) 2 % shampoo Apply 1 Application topically. Monday/ Wednesday/ Friday   07/17/2023 at  3:00 PM   linaclotide (LINZESS) 290 MCG CAPS capsule Take 290 mcg by mouth daily before breakfast.   07/17/2023 at  7:00 AM   magnesium citrate SOLN Take 1 Bottle by mouth daily as needed for severe constipation.   Taking As Needed   melatonin 5 MG TABS Take 0.5 tablets (2.5 mg total) by mouth at bedtime. 15 tablet 0 07/17/2023 at  8:00 PM   methocarbamol (ROBAXIN) 500 MG tablet Take 500 mg by mouth at bedtime.   Taking   montelukast (SINGULAIR) 10 MG tablet Take 10 mg by mouth daily.   07/17/2023 at  8:00 AM   nicotine (NICODERM CQ - DOSED IN MG/24 HR) 7 mg/24hr patch Place 1 patch (7 mg total) onto the skin daily. 360 patch 0 07/17/2023 at  8:00 AM   nicotine polacrilex (NICORETTE) 2 MG gum Take 2 mg by mouth as needed for smoking cessation.   Taking As Needed   nicotine polacrilex  (NICOTINE MINI) 4 MG lozenge Take 1 lozenge (4 mg total) by mouth as needed. 100 tablet 0 Taking As Needed   ondansetron (ZOFRAN-ODT) 4 MG disintegrating tablet Take 1 tablet (4 mg total) by mouth every 8 (eight) hours as needed for nausea or vomiting. 20 tablet 0 Taking As Needed   prazosin (MINIPRESS) 5 MG capsule Take 1 capsule (5 mg total) by mouth at bedtime. 30 capsule 1 07/17/2023 at  8:00 PM   risperiDONE (RISPERDAL) 0.5 MG tablet Take 0.5 mg by mouth 2 (two) times daily. 0800/1600   07/17/2023 at  4:00 PM   topiramate (TOPAMAX) 50 MG tablet Take 1 tablet (50 mg total) by mouth 2 (two) times daily. (Patient taking differently: Take 50 mg by mouth at bedtime.) 60 tablet 1 07/17/2023 at  8:00 PM   traZODone (DESYREL) 50 MG tablet Take 50 mg by mouth at bedtime.   07/17/2023 at  8:00 PM   etonogestrel (NEXPLANON) 68 MG IMPL implant 1 each (68 mg total) by Subdermal route once for 1 dose. 1 each 0     Patient  Stressors:    Patient Strengths:    Treatment Modalities: Medication Management, Group therapy, Case management,  1 to 1 session with clinician, Psychoeducation, Recreational therapy.   Physician Treatment Plan for Primary Diagnosis: MDD (major depressive disorder) Long Term Goal(s): Improvement in symptoms so as ready for discharge   Short Term Goals: Ability to identify changes in lifestyle to reduce recurrence of condition will improve Ability to verbalize feelings will improve Ability to disclose and discuss suicidal ideas Ability to demonstrate self-control will improve Ability to identify and develop effective coping behaviors will improve Ability to maintain clinical measurements within normal limits will improve Compliance with prescribed medications will improve Ability to identify triggers associated with substance abuse/mental health issues will improve  Medication Management: Evaluate patient's response, side effects, and tolerance of medication regimen.  Therapeutic  Interventions: 1 to 1 sessions, Unit Group sessions and Medication administration.  Evaluation of Outcomes: Progressing  Physician Treatment Plan for Secondary Diagnosis: Principal Problem:   MDD (major depressive disorder) Active Problems:   HIV disease (HCC)   Thrombosed hemorrhoids   Abdominal pain  Long Term Goal(s): Improvement in symptoms so as ready for discharge   Short Term Goals: Ability to identify changes in lifestyle to reduce recurrence of condition will improve Ability to verbalize feelings will improve Ability to disclose and discuss suicidal ideas Ability to demonstrate self-control will improve Ability to identify and develop effective coping behaviors will improve Ability to maintain clinical measurements within normal limits will improve Compliance with prescribed medications will improve Ability to identify triggers associated with substance abuse/mental health issues will improve     Medication Management: Evaluate patient's response, side effects, and tolerance of medication regimen.  Therapeutic Interventions: 1 to 1 sessions, Unit Group sessions and Medication administration.  Evaluation of Outcomes: Progressing   RN Treatment Plan for Primary Diagnosis: MDD (major depressive disorder) Long Term Goal(s): Knowledge of disease and therapeutic regimen to maintain health will improve  Short Term Goals: Ability to remain free from injury will improve, Ability to verbalize frustration and anger appropriately will improve, Ability to demonstrate self-control, Ability to participate in decision making will improve, Ability to verbalize feelings will improve, Ability to disclose and discuss suicidal ideas, Ability to identify and develop effective coping behaviors will improve, and Compliance with prescribed medications will improve  Medication Management: RN will administer medications as ordered by provider, will assess and evaluate patient's response and provide  education to patient for prescribed medication. RN will report any adverse and/or side effects to prescribing provider.  Therapeutic Interventions: 1 on 1 counseling sessions, Psychoeducation, Medication administration, Evaluate responses to treatment, Monitor vital signs and CBGs as ordered, Perform/monitor CIWA, COWS, AIMS and Fall Risk screenings as ordered, Perform wound care treatments as ordered.  Evaluation of Outcomes: Progressing   LCSW Treatment Plan for Primary Diagnosis: MDD (major depressive disorder) Long Term Goal(s): Safe transition to appropriate next level of care at discharge, Engage patient in therapeutic group addressing interpersonal concerns.  Short Term Goals: Engage patient in aftercare planning with referrals and resources, Increase social support, Increase ability to appropriately verbalize feelings, Increase emotional regulation, Facilitate acceptance of mental health diagnosis and concerns, and Increase skills for wellness and recovery  Therapeutic Interventions: Assess for all discharge needs, 1 to 1 time with Social worker, Explore available resources and support systems, Assess for adequacy in community support network, Educate family and significant other(s) on suicide prevention, Complete Psychosocial Assessment, Interpersonal group therapy.  Evaluation of Outcomes: Progressing   Progress  in Treatment: Attending groups: Yes. Participating in groups: Yes. Taking medication as prescribed: Yes. Toleration medication: Yes. Family/Significant other contact made: Yes, individual(s) contacted:  guardian, [name]. Patient understands diagnosis: Yes. Discussing patient identified problems/goals with staff: Yes. Medical problems stabilized or resolved: Yes. Denies suicidal/homicidal ideation: Yes. Issues/concerns per patient self-inventory: No. Other: none.   New problem(s) identified: No, Describe:  none identified.   New Short Term/Long Term Goal(s):  medication management for mood stabilization; elimination of SI thoughts; development of comprehensive mental wellness plan. Update 07/24/23: No changes at this time.    Patient Goals:  Pt declined to participate in treatment team meeting. Update 07/24/23: No changes at this time.   Discharge Plan or Barriers: CSW will assist pt/guardian with development of an appropriate aftercare/discharge plan. Update 07/24/23: Guardian notified team that pt will not be returning to group home.    Reason for Continuation of Hospitalization: Depression Medication stabilization Suicidal ideation   Estimated Length of Stay: 1-7 days Update 07/24/23: No changes at this time.   Last 3 [place] Suicide Severity Risk Score: Flowsheet Row Admission (Current) from 07/18/2023 in [facility] INPATIENT BEHAVIORAL MEDICINE ED from 07/11/2023 in [facility] Emergency Department at [facility] Admission (Discharged) from 07/05/2023 in [facility] INPATIENT BEHAVIORAL MEDICINE  C-SSRS RISK CATEGORY High Risk Error: Q3, 4, or 5 should not be populated when Q2 is No High Risk       Last PHQ 2/9 Scores:    05/04/2023    9:21 AM 02/16/2023    9:49 AM 11/17/2022   10:53 AM  Depression screen PHQ 2/9  Decreased Interest 0 0 0  Down, Depressed, Hopeless 0 0 1  PHQ - 2 Score 0 0 1    Scribe for Treatment Team: Glenis Smoker, LCSW 07/24/2023 9:57 AM

## 2023-07-24 NOTE — Group Note (Signed)
Date:  07/24/2023 Time:  10:16 AM  Group Topic/Focus:  Developing a Wellness Toolbox:   The focus of this group is to help patients develop a "wellness toolbox" with skills and strategies to promote recovery upon discharge. Identifying Needs:   The focus of this group is to help patients identify their personal needs that have been historically problematic and identify healthy behaviors to address their needs.    Participation Level:  Did Not Attend   Rosaura Carpenter 07/24/2023, 10:16 AM

## 2023-07-24 NOTE — Plan of Care (Signed)
Problem: Education: Goal: Emotional status will improve Outcome: Progressing Goal: Mental status will improve Outcome: Progressing Goal: Verbalization of understanding the information provided will improve Outcome: Progressing   Problem: Coping: Goal: Ability to verbalize frustrations and anger appropriately will improve Outcome: Progressing Goal: Ability to demonstrate self-control will improve Outcome: Progressing

## 2023-07-25 DIAGNOSIS — F3160 Bipolar disorder, current episode mixed, unspecified: Secondary | ICD-10-CM

## 2023-07-25 LAB — GLUCOSE, CAPILLARY
Glucose-Capillary: 83 mg/dL (ref 70–99)
Glucose-Capillary: 104 mg/dL — ABNORMAL HIGH (ref 70–99)
Glucose-Capillary: 208 mg/dL — ABNORMAL HIGH (ref 70–99)

## 2023-07-25 NOTE — Progress Notes (Signed)
Patient is now saying she is suicidal and showed this writer her arm. She has used the unit's patient medicine cup to scratch up her left forearm. She stated that she doesn't feel safe in her room by herself, so she is now sitting in the chair outside of the nurses' station. Providers have been notified via secure chat.

## 2023-07-25 NOTE — Group Note (Signed)
Recreation Therapy Group Note   Group Topic:Health and Wellness  Group Date: 07/25/2023 Start Time: 1500 End Time: 1600 Facilitators: Rosina Lowenstein, LRT, CTRS Location: Courtyard  Group Description: [employer]. LRT and patients played games of basketball, drew with chalk, and played corn hole while outside in the courtyard while getting fresh air and sunlight. Music was being played in the background. LRT and peers conversed about different games they have played before, what they do in their free time and anything else that is on their minds. LRT encouraged pts to drink water after being outside, sweating and getting their heart rate up.  Goal Area(s) Addressed: Patient will build on frustration tolerance skills. Patients will partake in a competitive play game with peers. Patients will gain knowledge of new leisure interest/hobby.    Affect/Mood: Appropriate   Participation Level: Active   Participation Quality: Independent   Behavior: Appropriate   Speech/Thought Process: Coherent   Insight: Fair   Judgement: Fair    Modes of Intervention: Activity and Music   Patient Response to Interventions:  Attentive and Receptive   Education Outcome:  Acknowledges education   Clinical Observations/Individualized Feedback: Roman was active in their participation of session activities and group discussion. Pt spent her time talking with peers and listening to music while outside. Pt interacted well with LRT and peers duration of session.    Plan: Continue to engage patient in RT group sessions 2-3x/week.   Rosina Lowenstein, LRT, CTRS 07/25/2023 5:34 PM

## 2023-07-25 NOTE — Progress Notes (Signed)
Providers have notified staff to place patient on a 1:1 due to her self-injurious behaviors.
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
## 2023-07-25 NOTE — Progress Notes (Signed)
1:1 Patient Monitoring  1800: Patient is in the hallway, sitting in the chair outside of the nurses' station.  1822: This Clinical research associate was notified by Information systems manager that she was using the playing cards from the dayroom to try and cut herself. The cards were brought to this writer and kept in the nurses' station.  1900: Patient is sitting in the hallway, talking with her assigned safety sitter.

## 2023-07-25 NOTE — Progress Notes (Signed)
Pt began stating she was suicidal, and began cutting on he left arm with a plastic medication cut. The cup was confiscated, ptaient placed on 1:1 per Provider. Pt than began yelling and banging her fist on the glass window and unable to follow redirections. Pt was given PRN, Ativan 2mg , Haldol 10mg   and Benadryl 50mg  IM, .was without resistant.Results pending. Pt then c/o pain to right hand, ice applied

## 2023-07-25 NOTE — Plan of Care (Signed)
Problem: Education: Goal: Knowledge of [facility] Education information/materials will improve Outcome: Progressing Goal: Emotional status will improve Outcome: Progressing Goal: Mental status will improve Outcome: Progressing Goal: Verbalization of understanding the information provided will improve Outcome: Progressing   Problem: Activity: Goal: Interest or engagement in activities will improve Outcome: Progressing Goal: Sleeping patterns will improve Outcome: Progressing   Problem: Coping: Goal: Ability to verbalize frustrations and anger appropriately will improve Outcome: Progressing Goal: Ability to demonstrate self-control will improve Outcome: Progressing   Problem: Health Behavior/Discharge Planning: Goal: Identification of resources available to assist in meeting health care needs will improve Outcome: Progressing Goal: Compliance with treatment plan for underlying cause of condition will improve Outcome: Progressing   Problem: Physical Regulation: Goal: Ability to maintain clinical measurements within normal limits will improve Outcome: Progressing   Problem: Safety: Goal: Periods of time without injury will increase Outcome: Progressing   Problem: Education: Goal: Utilization of techniques to improve thought processes will improve Outcome: Progressing Goal: Knowledge of the prescribed therapeutic regimen will improve Outcome: Progressing   Problem: Activity: Goal: Interest or engagement in leisure activities will improve Outcome: Progressing Goal: Imbalance in normal sleep/wake cycle will improve Outcome: Progressing   Problem: Coping: Goal: Coping ability will improve Outcome: Progressing Goal: Will verbalize feelings Outcome: Progressing   Problem: Health Behavior/Discharge Planning: Goal: Ability to make decisions will improve Outcome: Progressing Goal: Compliance with therapeutic regimen will improve Outcome: Progressing    Problem: Role Relationship: Goal: Will demonstrate positive changes in social behaviors and relationships Outcome: Progressing   Problem: Safety: Goal: Ability to disclose and discuss suicidal ideas will improve Outcome: Progressing Goal: Ability to identify and utilize support systems that promote safety will improve Outcome: Progressing   Problem: Self-Concept: Goal: Will verbalize positive feelings about self Outcome: Progressing Goal: Level of anxiety will decrease Outcome: Progressing   Problem: Education: Goal: Knowledge of General Education information will improve Description: Including pain rating scale, medication(s)/side effects and non-pharmacologic comfort measures Outcome: Progressing   Problem: Health Behavior/Discharge Planning: Goal: Ability to manage health-related needs will improve Outcome: Progressing

## 2023-07-25 NOTE — Progress Notes (Signed)
   07/25/23 1030  Psych Admission Type (Psych Patients Only)  Admission Status Voluntary  Psychosocial Assessment  Patient Complaints Depression (patient states "I'm missing Mia, the autistic girl at the group home".)  Eye Contact Fair;Watchful  Facial Expression Worried  Affect Appropriate to circumstance  Speech Soft;Slow  Interaction Assertive;Needy  Motor Activity Slow  Appearance/Hygiene In scrubs  Behavior Characteristics Cooperative;Appropriate to situation  Mood Depressed;Pleasant  Aggressive Behavior  Effect No apparent injury  Thought Process  Coherency WDL  Content Blaming others  Delusions None reported or observed  Perception WDL  Hallucination None reported or observed  Judgment Limited  Confusion None  Danger to Self  Current suicidal ideation? Denies  Self-Injurious Behavior No self-injurious ideation or behavior indicators observed or expressed   Agreement Not to Harm Self Yes  Description of Agreement Verbal  Danger to Others  Danger to Others None reported or observed

## 2023-07-25 NOTE — Group Note (Signed)
Date:  07/25/2023 Time:  11:11 AM  Group Topic/Focus:  Goals Group:   The focus of this group is to help patients establish daily goals to achieve during treatment and discuss how the patient can incorporate goal setting into their daily lives to aide in recovery.    Participation Level:  Did Not Attend   Lynelle Smoke [facility] 07/25/2023, 11:11 AM
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
## 2023-07-25 NOTE — Group Note (Signed)
Date:  07/25/2023 Time:  11:07 PM  Group Topic/Focus:  Orientation:   The focus of this group is to educate the patient on the purpose and policies of crisis stabilization and provide a format to answer questions about their admission.  The group details unit policies and expectations of patients while admitted.    Participation Level:  Active  Participation Quality:  Appropriate and Attentive  Affect:  Appropriate  Cognitive:  Alert and Appropriate  Insight: Appropriate  Engagement in Group:  Improving  Modes of Intervention:  Clarification, Discussion, Orientation, Rapport Building, and Support  Additional Comments:     Shaquill Iseman 07/25/2023, 11:07 PM

## 2023-07-25 NOTE — Group Note (Signed)
LCSW Group Therapy Note   Group Date: 07/25/2023 Start Time: 1330 End Time: 1430   Type of Therapy and Topic:  Group Therapy: Challenging Core Beliefs  Participation Level:  Did Not Attend  Description of Group:  Patients were educated about core beliefs and asked to identify one harmful core belief that they have. Patients were asked to explore from where those beliefs originate. Patients were asked to discuss how those beliefs make them feel and the resulting behaviors of those beliefs. They were then be asked if those beliefs are true and, if so, what evidence they have to support them. Lastly, group members were challenged to replace those negative core beliefs with helpful beliefs.   Therapeutic Goals:   1. Patient will identify harmful core beliefs and explore the origins of such beliefs. 2. Patient will identify feelings and behaviors that result from those core beliefs. 3. Patient will discuss whether such beliefs are true. 4.  Patient will replace harmful core beliefs with helpful ones.  Summary of Patient Progress:    Patient declined to attend treatment team.  Therapeutic Modalities: Cognitive Behavioral Therapy; Solution-Focused Therapy   Harden Mo, Theresia Majors 07/25/2023  2:44 PM

## 2023-07-25 NOTE — Group Note (Signed)
Recreation Therapy Group Note   Group Topic:Stress Management  Group Date: 07/25/2023 Start Time: 1000 End Time: 1055 Facilitators: Rosina Lowenstein, LRT, CTRS Location:  Craft Room  Group Description: Taboo. LRT and patients played the game Taboo. The object of the game is to have peers guess the word up at the top of the card drawn that is in bold, while being sure to not use any of the descriptive words down below it on the same card. If the person attempting to explain the word in bold uses any of the descriptive words down below, they lose their turn, and no one receives that card or point. LRT and patient's took turns being the one to describe the words while the rest of the group tried to guess what they were describing.   Goal Area(s) Addressed: Patient will identify physical symptoms of stress. Patient will identify emotional symptoms of stress. Patient will identify coping skills for stress. Patient will build frustration tolerance skills.  Patient will increase communication.   Affect/Mood: N/A   Participation Level: Did not attend    Clinical Observations/Individualized Feedback: Patient did not attend group.   Plan: Continue to engage patient in RT group sessions 2-3x/week.   Rosina Lowenstein, LRT, CTRS 07/25/2023 11:37 AM

## 2023-07-26 DIAGNOSIS — F3160 Bipolar disorder, current episode mixed, unspecified: Secondary | ICD-10-CM | POA: Diagnosis not present

## 2023-07-26 LAB — GLUCOSE, CAPILLARY
Glucose-Capillary: 81 mg/dL (ref 70–99)
Glucose-Capillary: 100 mg/dL — ABNORMAL HIGH (ref 70–99)
Glucose-Capillary: 113 mg/dL — ABNORMAL HIGH (ref 70–99)
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
## 2023-07-26 NOTE — Group Note (Signed)
LCSW Group Therapy Note   Group Date: 07/26/2023 Start Time: 1300 End Time: 1401   Type of Therapy and Topic:  Group Therapy: Challenging Core Beliefs  Participation Level:  Did Not Attend  Description of Group:  Patients were educated about core beliefs and asked to identify one harmful core belief that they have. Patients were asked to explore from where those beliefs originate. Patients were asked to discuss how those beliefs make them feel and the resulting behaviors of those beliefs. They were then be asked if those beliefs are true and, if so, what evidence they have to support them. Lastly, group members were challenged to replace those negative core beliefs with helpful beliefs.   Therapeutic Goals:   1. Patient will identify harmful core beliefs and explore the origins of such beliefs. 2. Patient will identify feelings and behaviors that result from those core beliefs. 3. Patient will discuss whether such beliefs are true. 4.  Patient will replace harmful core beliefs with helpful ones.  Summary of Patient Progress:  Did not attend.   Therapeutic Modalities: Cognitive Behavioral Therapy; Solution-Focused Therapy   Lowry Ram, LCSWA 07/26/2023  2:05 PM

## 2023-07-26 NOTE — Progress Notes (Signed)
Nursing 1:1 Note  Patient resting in her bed. No distress noted. 1:1 monitoring continues. Patient remains safe at this time.

## 2023-07-26 NOTE — BH Assessment (Signed)
Patient remains safe on unit. One to one sitter is present. Q15 safety checks still being performed.

## 2023-07-26 NOTE — Group Note (Signed)
Date:  07/26/2023 Time:  5:04 PM  Group Topic/Focus:  Outdoor Recreation Therapy is a form of psychotherapy that utilizes outdoor activities to promote physical, social, and psychological well-being    Participation Level:  Active  Participation Quality:  Appropriate  Affect:  Appropriate  Cognitive:  Appropriate  Insight: Appropriate  Engagement in Group:  Developing/Improving  Modes of Intervention:  Activity and Clarification  Additional Comments:    Crystal Black 07/26/2023, 5:04 PM

## 2023-07-26 NOTE — Plan of Care (Signed)
  Problem: Education: Goal: Emotional status will improve Outcome: Not Progressing Goal: Mental status will improve Outcome: Not Progressing   Problem: Coping: Goal: Ability to verbalize frustrations and anger appropriately will improve Outcome: Not Progressing

## 2023-07-26 NOTE — Group Note (Signed)
Date:  07/26/2023 Time:  10:39 PM  Group Topic/Focus:  Coping With Mental Health Crisis:   The purpose of this group is to help patients identify strategies for coping with mental health crisis.  Group discusses possible causes of crisis and ways to manage them effectively.    Participation Level:  Active  Participation Quality:  Appropriate and Attentive  Affect:  Appropriate  Cognitive:  Alert and Appropriate  Insight: Appropriate and Good  Engagement in Group:  Developing/Improving and Engaged  Modes of Intervention:  Activity, Discussion, Problem-solving, Rapport Building, and Support  Additional Comments:     Heriberto Stmartin 07/26/2023, 10:39 PM

## 2023-07-26 NOTE — Progress Notes (Signed)
   07/25/23 2000  Psych Admission Type (Psych Patients Only)  Admission Status Voluntary  Psychosocial Assessment  Patient Complaints Depression  Eye Contact Fair;Watchful  Facial Expression Worried  Affect Appropriate to circumstance  Speech Soft  Interaction Assertive;Needy  Motor Activity Slow  Appearance/Hygiene In scrubs  Behavior Characteristics Cooperative;Appropriate to situation  Mood Depressed  Aggressive Behavior  Effect No apparent injury  Thought Process  Coherency WDL  Content Blaming others  Delusions None reported or observed  Perception WDL  Hallucination None reported or observed  Judgment Limited  Confusion None  Danger to Self  Current suicidal ideation? Passive  Agreement Not to Harm Self Yes  Description of Agreement verba  Danger to Others  Danger to Others None reported or observed

## 2023-07-26 NOTE — BH Assessment (Signed)
Patient remains safe on unit. One to one sitter was present. Q15 safety checks still being performed.

## 2023-07-26 NOTE — Plan of Care (Signed)
  Problem: Activity: Goal: Interest or engagement in activities will improve Outcome: Progressing Goal: Sleeping patterns will improve Outcome: Progressing   Problem: Coping: Goal: Ability to verbalize frustrations and anger appropriately will improve Outcome: Not Progressing Goal: Ability to demonstrate self-control will improve Outcome: Not Progressing

## 2023-07-26 NOTE — Group Note (Signed)
Date:  07/26/2023 Time:  4:54 PM  Group Topic/Focus:  ART THERAPY focus  helps provide a way to express emotions and experiences not easily expressed in words. It is not about the final product; it is about healing through the process of making art.    Participation Level:  Active  Participation Quality:  Appropriate  Affect:  Appropriate  Cognitive:  Appropriate  Insight: Appropriate  Engagement in Group:  Developing/Improving  Modes of Intervention:  Activity and Discussion  Additional Comments:    Lott Seelbach 07/26/2023, 4:54 PM

## 2023-07-27 DIAGNOSIS — F3160 Bipolar disorder, current episode mixed, unspecified: Secondary | ICD-10-CM | POA: Diagnosis not present

## 2023-07-27 LAB — GLUCOSE, CAPILLARY
Glucose-Capillary: 152 mg/dL — ABNORMAL HIGH (ref 70–99)
Glucose-Capillary: 91 mg/dL (ref 70–99)
Glucose-Capillary: 125 mg/dL — ABNORMAL HIGH (ref 70–99)
Glucose-Capillary: 146 mg/dL — ABNORMAL HIGH (ref 70–99)

## 2023-07-27 MED ORDER — DIVALPROEX SODIUM ER 250 MG PO TB24
250.0000 mg | ORAL_TABLET | Freq: Two times a day (BID) | ORAL | Status: DC
Start: 2023-07-27 — End: 2023-08-02
  Administered 2023-07-27 – 2023-08-01 (×11): 250 mg via ORAL
  Filled 2023-07-27 (×14): qty 1

## 2023-07-27 MED ORDER — METFORMIN HCL 500 MG PO TABS
500.0000 mg | ORAL_TABLET | Freq: Every day | ORAL | Status: DC
  Administered 2023-07-28 – 2023-08-16 (×20): 500 mg via ORAL
  Filled 2023-07-27 (×20): qty 1

## 2023-07-27 NOTE — Progress Notes (Signed)
Remains with sitter for safety. Continues to complain of foot pain and is requesting an xray. Pt given ice bags and instructed to elevate. Denies SI, HI, AVH.

## 2023-07-27 NOTE — Group Note (Signed)
Date:  07/27/2023 Time:  9:49 PM  Group Topic/Focus:  Goals Group:   The focus of this group is to help patients establish daily goals to achieve during treatment and discuss how the patient can incorporate goal setting into their daily lives to aide in recovery.    Participation Level:  Active  Participation Quality:  Appropriate and Attentive  Affect:  Appropriate  Cognitive:  Alert and Appropriate  Insight: Appropriate and Good  Engagement in Group:  Developing/Improving and Engaged  Modes of Intervention:  Discussion, Orientation, Rapport Building, and Support  Additional Comments:     Crystal Black 07/27/2023, 9:49 PM

## 2023-07-27 NOTE — Progress Notes (Signed)
Pt states she twisted left ankle notified [facility]NP  applied ICE, elevated leg and adm Advil

## 2023-07-27 NOTE — Plan of Care (Signed)
Problem: Education: Goal: Emotional status will improve Outcome: Progressing Goal: Mental status will improve Outcome: Progressing Goal: Verbalization of understanding the information provided will improve Outcome: Progressing   Problem: Activity: Goal: Interest or engagement in activities will improve Outcome: Progressing

## 2023-07-27 NOTE — [facility] Counselor (Signed)
CSW spoke with the patient's guardian.  CSW informed guardian that patient remains on diabetic medications.    CSW informed that pt is on 1:1 at this time due to reported use of a playing card to harm herself.  CSW notes that nursing staff reported in progression that patient allegedly used a card in an attempt to allegedly harm herself.   CSW informed that per progression meeting, Nurse Manager Thayer Ohm, reported that patient may remain on 1:1 for the duration of her hospitalization.  CSW inquired about placement for patient and was informed that there has not been any progress in identifying placement at this time.   Guardian expressed understanding of 1:1.  [name], MSW, LCSW 07/27/2023 4:19 PM

## 2023-07-27 NOTE — Group Note (Signed)
Recreation Therapy Group Note   Group Topic:Self-Esteem  Group Date: 07/27/2023 Start Time: 1000 End Time: 1100 Facilitators: Rosina Lowenstein, LRT, CTRS Location:  Craft Room  Group Description: Positive Affirmation Worksheet. Patients and LRT discussed the importance of self-love/self-esteem and things that cause it to fluctuate, including our mental health. Pt completed a worksheet that helps them identify 24 different strengths and qualities about themselves. Pt encouraged to read aloud at least 2 off their sheet to the group. LRT and pts discussed how this can be applied to daily life post-discharge. LRT and patients played positive affirmation bingo afterwards.  Goal Area(s) Addressed: Patient will identify positive qualities about themselves. Patient will learn new positive affirmations.  Patient will recite positive qualities and affirmations aloud to the group.  Patient will practice positive self-talk.  Patient will increase communication.   Affect/Mood: Appropriate   Participation Level: Active and Engaged   Participation Quality: Independent   Behavior: Calm and Cooperative   Speech/Thought Process: Coherent   Insight: Good   Judgement: Good   Modes of Intervention: Education, Exploration, Guided Discussion, Support, and Worksheet   Patient Response to Interventions:  Attentive, Engaged, and Interested    Education Outcome:  Acknowledges education   Clinical Observations/Individualized Feedback: Crystal Black was active in their participation of session activities and group discussion. Pt identified "I looked good when I am dressed up in nice clothing. I have a good sense of humor". Pt interacted well with LRT and peers duration of session.    Plan: Continue to engage patient in RT group sessions 2-3x/week.   Rosina Lowenstein, LRT, CTRS 07/27/2023 12:29 PM

## 2023-07-27 NOTE — Progress Notes (Signed)
   07/27/23 1100  Psych Admission Type (Psych Patients Only)  Admission Status Voluntary  Psychosocial Assessment  Patient Complaints Anxiety;Depression;Self-harm thoughts  Eye Contact Fair  Facial Expression Flat  Affect Anxious  Speech Logical/coherent  Interaction Assertive  Motor Activity Slow  Appearance/Hygiene Unremarkable;In scrubs  Behavior Characteristics Anxious  Aggressive Behavior  Effect No apparent injury  Thought Process  Coherency WDL  Content WDL  Delusions WDL  Perception WDL  Hallucination None reported or observed  Judgment WDL  Confusion None  Danger to Self  Current suicidal ideation? Passive  Description of Suicide Plan denies  Self-Injurious Behavior No self-injurious ideation or behavior indicators observed or expressed   Agreement Not to Harm Self Yes  Description of Agreement verbal  Danger to Others  Danger to Others None reported or observed

## 2023-07-27 NOTE — Group Note (Signed)
Recreation Therapy Group Note   Group Topic:Health and Wellness  Group Date: 07/27/2023 Start Time: 1530 End Time: 1610 Facilitators: Rosina Lowenstein, LRT, CTRS Location: Courtyard  Group Description: [employer]. LRT and patients played games of basketball, drew with chalk, and played corn hole while outside in the courtyard while getting fresh air and sunlight. Music was being played in the background. LRT and peers conversed about different games they have played before, what they do in their free time and anything else that is on their minds. LRT encouraged pts to drink water after being outside, sweating and getting their heart rate up.  Goal Area(s) Addressed: Patient will build on frustration tolerance skills. Patients will partake in a competitive play game with peers. Patients will gain knowledge of new leisure interest/hobby.    Affect/Mood: N/A   Participation Level: Did not attend    Clinical Observations/Individualized Feedback: Patient did not attend group.   Plan: Continue to engage patient in RT group sessions 2-3x/week.   Rosina Lowenstein, LRT, CTRS 07/27/2023 5:30 PM

## 2023-07-27 NOTE — Progress Notes (Addendum)
0700 pt sleeping in room with 1:1 safety sitter at bedside  0800 pt sleeping in room with 1:1 safety sitter at bedside  0900 pt is sleeping in room with 1:1 safety sitter at bedside   1000 pt is in group with 1:1 safety sitter  1100 pt is on bedroom with 1:1 safety sitter  1200 pt is in calm in dayroom with  1:1 safety sitter  1300 pt is in room sleeping   1400 pt is in room sleeping  1500 pt is in room sleeping   1600 pt is in dayroom  with 1:1 sitter  1700 pt in in dayroom with 1:1 sitter  1800 pt in room with 1:1 sitter  1900 pt in room with 1:1 sitter

## 2023-07-27 NOTE — Group Note (Signed)
[facility] LCSW Group Therapy Note   Group Date: 07/27/2023 Start Time: 1300 End Time: 1340   Type of Therapy/Topic:  Group Therapy:  Balance in Life  Participation Level:  Did Not Attend   Description of Group:    This group will address the concept of balance and how it feels and looks when one is unbalanced. Patients will be encouraged to process areas in their lives that are out of balance, and identify reasons for remaining unbalanced. Facilitators will guide patients utilizing problem- solving interventions to address and correct the stressor making their life unbalanced. Understanding and applying boundaries will be explored and addressed for obtaining  and maintaining a balanced life. Patients will be encouraged to explore ways to assertively make their unbalanced needs known to significant others in their lives, using other group members and facilitator for support and feedback.  Therapeutic Goals: Patient will identify two or more emotions or situations they have that consume much of in their lives. Patient will identify signs/triggers that life has become out of balance:  Patient will identify two ways to set boundaries in order to achieve balance in their lives:  Patient will demonstrate ability to communicate their needs through discussion and/or role plays  Summary of Patient Progress: X   Therapeutic Modalities:   Cognitive Behavioral Therapy Solution-Focused Therapy Assertiveness Training   Glenis Smoker, LCSW

## 2023-07-27 NOTE — Plan of Care (Signed)
  Problem: Education: Goal: Emotional status will improve Outcome: Progressing Goal: Mental status will improve Outcome: Progressing Goal: Verbalization of understanding the information provided will improve Outcome: Progressing   Problem: Coping: Goal: Coping ability will improve Outcome: Progressing   Problem: Safety: Goal: Ability to disclose and discuss suicidal ideas will improve Outcome: Progressing

## 2023-07-27 NOTE — Group Note (Signed)
Date:  07/27/2023 Time:  10:48 AM  Group Topic/Focus:  Goals Group:   The focus of this group is to help patients establish daily goals to achieve during treatment and discuss how the patient can incorporate goal setting into their daily lives to aide in recovery.  Participation Level:  Did Not Attend  Deray Dawes A Saman Giddens 07/27/2023, 10:48 AM
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
## 2023-07-27 NOTE — Progress Notes (Signed)
Patient continues with sitter for safety while awake. Most of early shift in dayroom in no distress. Denies SI, HI, AVH. Medication compliant, appropriate with staff and peers. PRN given for sleep and spasms with good relief. Voiced no concerns or complaints.  Encouragement and support provided. Safety checks maintained. Pt remains safe on unit with q 15 min checks.

## 2023-07-27 NOTE — Plan of Care (Signed)
  Problem: Education: Goal: Knowledge of [place] [facility] Education information/materials will improve Outcome: Progressing Goal: Emotional status will improve Outcome: Progressing Goal: Mental status will improve Outcome: Progressing Goal: Verbalization of understanding the information provided will improve Outcome: Progressing   Problem: Activity: Goal: Interest or engagement in activities will improve Outcome: Progressing Goal: Sleeping patterns will improve Outcome: Progressing   Problem: Health Behavior/Discharge Planning: Goal: Identification of resources available to assist in meeting health care needs will improve Outcome: Progressing Goal: Compliance with treatment plan for underlying cause of condition will improve Outcome: Progressing   Problem: Physical Regulation: Goal: Ability to maintain clinical measurements within normal limits will improve Outcome: Progressing   Problem: Education: Goal: Utilization of techniques to improve thought processes will improve Outcome: Progressing Goal: Knowledge of the prescribed therapeutic regimen will improve Outcome: Progressing   Problem: Activity: Goal: Interest or engagement in leisure activities will improve Outcome: Progressing Goal: Imbalance in normal sleep/wake cycle will improve Outcome: Progressing   Problem: Coping: Goal: Coping ability will improve Outcome: Progressing Goal: Will verbalize feelings Outcome: Progressing   Problem: Health Behavior/Discharge Planning: Goal: Ability to make decisions will improve Outcome: Progressing Goal: Compliance with therapeutic regimen will improve Outcome: Progressing   Problem: Role Relationship: Goal: Will demonstrate positive changes in social behaviors and relationships Outcome: Progressing   Problem: Safety: Goal: Ability to disclose and discuss suicidal ideas will improve Outcome: Progressing Goal: Ability to identify and utilize support systems that  promote safety will improve Outcome: Progressing   Problem: Self-Concept: Goal: Will verbalize positive feelings about self Outcome: Progressing Goal: Level of anxiety will decrease Outcome: Progressing   Problem: Education: Goal: Knowledge of General Education information will improve Description: Including pain rating scale, medication(s)/side effects and non-pharmacologic comfort measures Outcome: Progressing   Problem: Health Behavior/Discharge Planning: Goal: Ability to manage health-related needs will improve Outcome: Progressing

## 2023-07-28 ENCOUNTER — Inpatient Hospital Stay: Payer: MEDICAID

## 2023-07-28 DIAGNOSIS — F3160 Bipolar disorder, current episode mixed, unspecified: Secondary | ICD-10-CM | POA: Diagnosis not present

## 2023-07-28 LAB — GLUCOSE, CAPILLARY
Glucose-Capillary: 114 mg/dL — ABNORMAL HIGH (ref 70–99)
Glucose-Capillary: 93 mg/dL (ref 70–99)

## 2023-07-28 MED ORDER — PRAZOSIN HCL 2 MG PO CAPS
5.0000 mg | ORAL_CAPSULE | Freq: Every day | ORAL | Status: DC
  Administered 2023-07-28 – 2023-08-16 (×17): 5 mg via ORAL
  Filled 2023-07-28 (×4): qty 1
  Filled 2023-07-28: qty 2
  Filled 2023-07-28 (×16): qty 1

## 2023-07-28 NOTE — Group Note (Signed)
Recreation Therapy Group Note   Group Topic:Leisure Education  Group Date: 07/28/2023 Start Time: 1000 End Time: 1110 Facilitators: Rosina Lowenstein, LRT, CTRS Location:  Craft Room  Group Description: Leisure. Patients were given the option to choose from singing karaoke, coloring mandalas, using oil pastels, journaling, or playing with play-doh. LRT and pts discussed the meaning of leisure, the importance of participating in leisure during their free time/when they're outside of the hospital, as well as how our leisure interests can also serve as coping skills.   Goal Area(s) Addressed:  Patient will identify a current leisure interest.  Patient will learn the definition of "leisure". Patient will practice making a positive decision. Patient will have the opportunity to try a new leisure activity. Patient will communicate with peers and LRT.    Affect/Mood: N/A   Participation Level: Did not attend    Clinical Observations/Individualized Feedback: Patient did not attend group.   Plan: Continue to engage patient in RT group sessions 2-3x/week.   Rosina Lowenstein, LRT, CTRS 07/28/2023 11:42 AM

## 2023-07-28 NOTE — Group Note (Signed)
Recreation Therapy Group Note   Group Topic:General Recreation  Group Date: 07/28/2023 Start Time: 1500 End Time: 1600 Facilitators: Rosina Lowenstein, LRT, CTRS Location: Courtyard  Group Description: [employer]. LRT and patients played games of basketball, drew with chalk, and played corn hole while outside in the courtyard while getting fresh air and sunlight. Music was being played in the background. LRT and peers conversed about different games they have played before, what they do in their free time and anything else that is on their minds. LRT encouraged pts to drink water after being outside, sweating and getting their heart rate up.   Goal Area(s) Addressed:   Patient will build on frustration tolerance skills.   Patients will partake in a competitive play game with peers.   Patients will gain knowledge of new leisure interest/hobby.     Affect/Mood: N/A   Participation Level: Minimal    Clinical Observations/Individualized Feedback: Kahli was present in group or less than 5 minutes.   Plan: Continue to engage patient in RT group sessions 2-3x/week.   Rosina Lowenstein, LRT, CTRS 07/28/2023 5:05 PM

## 2023-07-28 NOTE — Progress Notes (Signed)
Patient 1:1 while awake. Patient currently sleeping in her room. Room and bed in view of nurses station. No distress noted. Patient remains safe at this time.
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
## 2023-07-28 NOTE — Group Note (Signed)
Date:  07/29/2023 Time:  12:02 AM  Group Topic/Focus:  Coping With Mental Health Crisis:   The purpose of this group is to help patients identify strategies for coping with mental health crisis.  Group discusses possible causes of crisis and ways to manage them effectively.    Participation Level:  Did Not Attend  Participation Quality:   N/A  Affect:   N/A  Cognitive:   N/A  Insight: None  Engagement in Group:   N/A  Modes of Intervention:   N/A  Additional Comments:  Patient decided not to join group to avoid "drama".  Crystal Black 07/29/2023, 12:02 AM

## 2023-07-28 NOTE — Plan of Care (Signed)
   Problem: Education: Goal: Emotional status will improve Outcome: Progressing Goal: Mental status will improve Outcome: Progressing Goal: Verbalization of understanding the information provided will improve Outcome: Progressing

## 2023-07-28 NOTE — Group Note (Signed)
Date:  07/28/2023 Time:  10:27 AM  Group Topic/Focus:  Goals Group:   The focus of this group is to help patients establish daily goals to achieve during treatment and discuss how the patient can incorporate goal setting into their daily lives to aide in recovery.    Participation Level:  Did Not Attend   Crystal Black 07/28/2023, 10:27 AM

## 2023-07-29 DIAGNOSIS — F331 Major depressive disorder, recurrent, moderate: Secondary | ICD-10-CM | POA: Diagnosis not present

## 2023-07-29 NOTE — Progress Notes (Signed)
   07/28/23 2000  Psych Admission Type (Psych Patients Only)  Admission Status Voluntary  Psychosocial Assessment  Patient Complaints Anxiety  Eye Contact Fair  Facial Expression Flat  Affect Apprehensive;Irritable  Speech Logical/coherent  Interaction Minimal;Needy  Motor Activity Slow  Appearance/Hygiene Improved  Behavior Characteristics Anxious  Mood Anxious  Thought Process  Coherency Concrete thinking  Content Preoccupation  Delusions None reported or observed  Perception WDL  Hallucination None reported or observed  Judgment Limited  Confusion None  Danger to Self  Current suicidal ideation? Denies  Danger to Others  Danger to Others None reported or observed   Patient alert and oriented x 4, affect is flat thoughts are organized, she denies SI/HI/AVH using a wheel chair because of unstable gait after a fall. 15 minutes safety checks maintained will continue to monitor.

## 2023-07-29 NOTE — Progress Notes (Signed)
   07/29/23 1700  Psych Admission Type (Psych Patients Only)  Admission Status Voluntary  Psychosocial Assessment  Patient Complaints Anxiety;Depression;Other (Comment) (constipation; patient states that she has "a little bit" of depression, "but not as bad". Patient isn't sure what is making her anxious today.)  Eye Contact Fair;Watchful  Facial Expression Anxious;Worried  Affect Anxious;Appropriate to circumstance  Speech Logical/coherent  Interaction Assertive  Motor Activity Slow  Appearance/Hygiene Unremarkable  Behavior Characteristics Cooperative;Appropriate to situation  Mood Pleasant  Aggressive Behavior  Effect No apparent injury  Thought Process  Coherency WDL  Content WDL  Delusions None reported or observed  Perception WDL  Hallucination None reported or observed  Judgment WDL  Confusion None  Danger to Self  Current suicidal ideation? Denies  Self-Injurious Behavior No self-injurious ideation or behavior indicators observed or expressed   Agreement Not to Harm Self Yes  Description of Agreement Verbal  Danger to Others  Danger to Others None reported or observed

## 2023-07-29 NOTE — Progress Notes (Signed)
1:1 Patient Monitoring (while awake):  1015: Patient is in the medication room, with this writer, receiving scheduled morning medication. Patient's assigned safety sitter is present at her side.  1115: Patient is in the dayroom, with other members on the unit and her assigned safety sitter.  1215: Patient is in the dayroom, talking to another female patient on the unit, with her assigned safety sitter present.  1315: Patient is in the courtyard, with other members on the unit, and her assigned Recruitment consultant.  1715:Patient is in the dayroom, eating dinner and talking with other members on the unit. Patient's safety sitter is also present.  1815: Patient is in the restroom, in her room, with her assigned safety sitter present.  1915: Patient is in her room, sitting on bed, with her assigned safety sitter present.

## 2023-07-29 NOTE — Group Note (Signed)
Date:  07/29/2023 Time:  9:36 PM  Group Topic/Focus:  Emotional Education:   The focus of this group is to discuss what feelings/emotions are, and how they are experienced.    Participation Level:  Active  Participation Quality:  Appropriate  Affect:  Appropriate  Cognitive:  Appropriate  Insight: Appropriate  Engagement in Group:  Engaged  Modes of Intervention:  Education  Additional Comments:  Patient presented outside of normal limits.  Lorenda Ishihara 07/29/2023, 9:36 PM

## 2023-07-29 NOTE — Plan of Care (Signed)
   Problem: Education: Goal: Knowledge of [facility] Education information/materials will improve Outcome: Progressing Goal: Emotional status will improve Outcome: Progressing Goal: Mental status will improve Outcome: Progressing Goal: Verbalization of understanding the information provided will improve Outcome: Progressing   Problem: Activity: Goal: Interest or engagement in activities will improve Outcome: Progressing Goal: Sleeping patterns will improve Outcome: Progressing   Problem: Coping: Goal: Ability to verbalize frustrations and anger appropriately will improve Outcome: Progressing Goal: Ability to demonstrate self-control will improve Outcome: Progressing   Problem: Health Behavior/Discharge Planning: Goal: Identification of resources available to assist in meeting health care needs will improve Outcome: Progressing Goal: Compliance with treatment plan for underlying cause of condition will improve Outcome: Progressing   Problem: Physical Regulation: Goal: Ability to maintain clinical measurements within normal limits will improve Outcome: Progressing   Problem: Safety: Goal: Periods of time without injury will increase Outcome: Progressing   Problem: Education: Goal: Utilization of techniques to improve thought processes will improve Outcome: Progressing Goal: Knowledge of the prescribed therapeutic regimen will improve Outcome: Progressing   Problem: Activity: Goal: Interest or engagement in leisure activities will improve Outcome: Progressing Goal: Imbalance in normal sleep/wake cycle will improve Outcome: Progressing   Problem: Coping: Goal: Coping ability will improve Outcome: Progressing Goal: Will verbalize feelings Outcome: Progressing   Problem: Health Behavior/Discharge Planning: Goal: Ability to make decisions will improve Outcome: Progressing Goal: Compliance with therapeutic regimen will improve Outcome: Progressing    Problem: Role Relationship: Goal: Will demonstrate positive changes in social behaviors and relationships Outcome: Progressing   Problem: Safety: Goal: Ability to disclose and discuss suicidal ideas will improve Outcome: Progressing Goal: Ability to identify and utilize support systems that promote safety will improve Outcome: Progressing   Problem: Self-Concept: Goal: Will verbalize positive feelings about self Outcome: Progressing Goal: Level of anxiety will decrease Outcome: Progressing   Problem: Education: Goal: Knowledge of General Education information will improve Description: Including pain rating scale, medication(s)/side effects and non-pharmacologic comfort measures Outcome: Progressing   Problem: Health Behavior/Discharge Planning: Goal: Ability to manage health-related needs will improve Outcome: Progressing

## 2023-07-29 NOTE — BH IP Treatment Plan (Signed)
Interdisciplinary Treatment and Diagnostic Plan Update  07/29/2023 Time of Session: 10:12am Crystal Black MRN: [id number]  Principal Diagnosis: Bipolar I disorder, most recent episode mixed (HCC)  Secondary Diagnoses: Principal Problem:   Bipolar I disorder, most recent episode mixed (HCC) Active Problems:   PTSD (post-traumatic stress disorder)   HIV disease (HCC)   Thrombosed hemorrhoids   Abdominal pain   Current Medications:  Current Facility-Administered Medications  Medication Dose Route Frequency Provider Last Rate Last Admin   albuterol (VENTOLIN HFA) 108 (90 Base) MCG/ACT inhaler 2 puff  2 puff Inhalation Q4H PRN [name], NP   2 puff at 07/23/23 0735   alum & mag hydroxide-simeth (MAALOX/MYLANTA) 200-200-20 MG/5ML suspension 30 mL  30 mL Oral Q4H PRN McLauchlin, [name], NP   30 mL at 07/26/23 1446   darunavir-cobicistat (PREZCOBIX) 800-150 MG per tablet 1 tablet  1 tablet Oral Q breakfast [name], NP   1 tablet at 07/27/23 0940   haloperidol (HALDOL) tablet 5 mg  5 mg Oral TID PRN McLauchlin, [name], NP       And   diphenhydrAMINE (BENADRYL) capsule 50 mg  50 mg Oral TID PRN McLauchlin, [name], NP       haloperidol (HALDOL) tablet 5 mg  5 mg Oral TID PRN [name], NP   5 mg at 07/24/23 1801   And   diphenhydrAMINE (BENADRYL) capsule 50 mg  50 mg Oral TID PRN [name], NP   50 mg at 07/24/23 1801   haloperidol lactate (HALDOL) injection 5 mg  5 mg Intramuscular TID PRN McLauchlin, [name], NP       And   diphenhydrAMINE (BENADRYL) injection 50 mg  50 mg Intramuscular TID PRN McLauchlin, [name], NP       And   LORazepam (ATIVAN) injection 2 mg  2 mg Intramuscular TID PRN McLauchlin, [name], NP       haloperidol lactate (HALDOL) injection 10 mg  10 mg Intramuscular TID PRN McLauchlin, [name], NP       And   diphenhydrAMINE (BENADRYL) injection 50 mg  50 mg Intramuscular TID PRN McLauchlin, [name], NP       And   LORazepam (ATIVAN) injection 2  mg  2 mg Intramuscular TID PRN McLauchlin, [name], NP       haloperidol lactate (HALDOL) injection 5 mg  5 mg Intramuscular TID PRN [name], NP       And   diphenhydrAMINE (BENADRYL) injection 50 mg  50 mg Intramuscular TID PRN [name], NP       And   LORazepam (ATIVAN) injection 2 mg  2 mg Intramuscular TID PRN [name], NP       haloperidol lactate (HALDOL) injection 10 mg  10 mg Intramuscular TID PRN [name], NP   10 mg at 07/25/23 1836   And   diphenhydrAMINE (BENADRYL) injection 50 mg  50 mg Intramuscular TID PRN [name], NP   50 mg at 07/25/23 1839   And   LORazepam (ATIVAN) injection 2 mg  2 mg Intramuscular TID PRN [name], NP   2 mg at 07/25/23 1840   divalproex (DEPAKOTE ER) 24 hr tablet 250 mg  250 mg Oral BID [name], [name], NP   250 mg at 07/28/23 1641   docusate sodium (COLACE) capsule 100 mg  100 mg Oral Daily [name], NP   100 mg at 07/28/23 0920   dolutegravir (TIVICAY) tablet 50 mg  50 mg Oral Daily [name], NP   50 mg at 07/28/23 0920   famotidine (PEPCID) tablet 20 mg  20 mg Oral Daily [name], NP   20 mg at 07/28/23 0920   feeding supplement (GLUCERNA SHAKE) (GLUCERNA SHAKE) liquid 237 mL  237 mL Oral TID BM [name], NP   237 mL at 07/28/23 2100   FLUoxetine (PROZAC) capsule 40 mg  40 mg Oral Daily [name], NP   40 mg at 07/28/23 0920   hydrocortisone (ANUSOL-HC) 2.5 % rectal cream   Rectal BID [name], MD   Given at 07/25/23 1710   hydrOXYzine (ATARAX) tablet 50 mg  50 mg Oral Q6H PRN [name], NP   50 mg at 07/26/23 1814   ibuprofen (ADVIL) tablet 600 mg  600 mg Oral Q8H PRN [name], NP   600 mg at 07/28/23 8295   influenza vac split trivalent PF (FLULAVAL) injection 0.5 mL  0.5 mL Intramuscular Tomorrow-1000 [name], MD       linaclotide [name]) capsule 290 mcg  290 mcg Oral QAC breakfast [name], NP   290 mcg at 07/28/23 0920    magnesium hydroxide (MILK OF MAGNESIA) suspension 30 mL  30 mL Oral Daily PRN McLauchlin, [name], NP   30 mL at 07/28/23 2102   melatonin tablet 5 mg  5 mg Oral QHS [name], NP   5 mg at 07/28/23 2217   metFORMIN (GLUCOPHAGE) tablet 500 mg  500 mg Oral Q breakfast Melanie Crazier, [name], NP   500 mg at 07/28/23 0920   methocarbamol (ROBAXIN) tablet 500 mg  500 mg Oral QHS [name], NP   500 mg at 07/28/23 2217   montelukast (SINGULAIR) tablet 5 mg  5 mg Oral QHS [name], [name], DO   5 mg at 07/27/23 2113   nicotine (NICODERM CQ - dosed in mg/24 hours) patch 14 mg  14 mg Transdermal Daily [name], MD   14 mg at 07/24/23 1038   nicotine polacrilex (NICORETTE) gum 2 mg  2 mg Oral PRN [name], NP   2 mg at 07/28/23 1251   ondansetron (ZOFRAN) tablet 4 mg  4 mg Oral Q8H PRN [name], NP   4 mg at 07/22/23 2123   prazosin (MINIPRESS) capsule 5 mg  5 mg Oral QHS [name], [name], NP   5 mg at 07/28/23 2100   traZODone (DESYREL) tablet 50 mg  50 mg Oral QHS PRN McLauchlin, [name], NP   50 mg at 07/28/23 2100   PTA Medications: Medications Prior to Admission  Medication Sig Dispense Refill Last Dose/Taking   albuterol (VENTOLIN HFA) 108 (90 Base) MCG/ACT inhaler Inhale 2 puffs into the lungs every 4 (four) hours as needed for wheezing or shortness of breath.   Taking As Needed   ARIPiprazole (ABILIFY) 2 MG tablet Take 1 tablet (2 mg total) by mouth daily. 12 tablet 0 07/17/2023 at  8:00 AM   [START ON 08/06/2023] ARIPiprazole ER (ABILIFY MAINTENA) 400 MG SRER injection Inject 1.5 mLs (300 mg total) into the muscle every 28 (twenty-eight) days. 1.5 mL 0 06/30/2023   benztropine (COGENTIN) 1 MG tablet Take 1 tablet (1 mg total) by mouth 2 (two) times daily. 60 tablet 0 07/17/2023 at  8:00 PM   darunavir-cobicistat (PREZCOBIX) 800-150 MG tablet Take 1 tablet by mouth daily with breakfast. Swallow whole. Do NOT crush, break or chew tablets. Take  with food. 30 tablet 1 07/17/2023 at   8:00 AM   divalproex (DEPAKOTE ER) 250 MG 24 hr tablet Take 1 tablet (250 mg total) by mouth 2 (two) times daily. 60 tablet 0 07/17/2023 at  8:00 PM   docusate sodium (COLACE) 100 MG capsule Take 300 mg by mouth daily.   07/17/2023 at  8:00 AM   dolutegravir (TIVICAY) 50 MG tablet Take 1 tablet (50 mg total) by mouth daily. 30 tablet 1 07/17/2023 at  8:00 AM   EPINEPHrine 0.3 mg/0.3 mL IJ SOAJ injection Inject 0.3 mg into the muscle as needed for anaphylaxis.   Taking As Needed   famotidine (PEPCID) 20 MG tablet Take 20 mg by mouth 2 (two) times daily.   Taking   FIBER ADULT GUMMIES PO Take 1 tablet by mouth in the morning and at bedtime.   07/17/2023 at  8:00 PM   FLUoxetine (PROZAC) 40 MG capsule Take 40 mg by mouth at bedtime.   07/17/2023 at  8:00 PM   glycerin adult 2 g suppository Place 1 suppository rectally as needed for constipation.   Taking As Needed   hydrOXYzine (ATARAX) 25 MG tablet Take 1 tablet (25 mg total) by mouth 3 (three) times daily as needed. 60 tablet 1 Taking As Needed   ibuprofen (ADVIL) 800 MG tablet Take 1 tablet (800 mg total) by mouth every 8 (eight) hours as needed for moderate pain. 15 tablet 0 Taking As Needed   ketoconazole (NIZORAL) 2 % shampoo Apply 1 Application topically. Monday/ Wednesday/ Friday   07/17/2023 at  3:00 PM   linaclotide (LINZESS) 290 MCG CAPS capsule Take 290 mcg by mouth daily before breakfast.   07/17/2023 at  7:00 AM   magnesium citrate SOLN Take 1 Bottle by mouth daily as needed for severe constipation.   Taking As Needed   melatonin 5 MG TABS Take 0.5 tablets (2.5 mg total) by mouth at bedtime. 15 tablet 0 07/17/2023 at  8:00 PM   methocarbamol (ROBAXIN) 500 MG tablet Take 500 mg by mouth at bedtime.   Taking   montelukast (SINGULAIR) 10 MG tablet Take 10 mg by mouth daily.   07/17/2023 at  8:00 AM   nicotine (NICODERM CQ - DOSED IN MG/24 HR) 7 mg/24hr patch Place 1 patch (7 mg total) onto the skin daily. 360 patch 0 07/17/2023 at  8:00 AM   nicotine  polacrilex (NICORETTE) 2 MG gum Take 2 mg by mouth as needed for smoking cessation.   Taking As Needed   nicotine polacrilex (NICOTINE MINI) 4 MG lozenge Take 1 lozenge (4 mg total) by mouth as needed. 100 tablet 0 Taking As Needed   ondansetron (ZOFRAN-ODT) 4 MG disintegrating tablet Take 1 tablet (4 mg total) by mouth every 8 (eight) hours as needed for nausea or vomiting. 20 tablet 0 Taking As Needed   prazosin (MINIPRESS) 5 MG capsule Take 1 capsule (5 mg total) by mouth at bedtime. 30 capsule 1 07/17/2023 at  8:00 PM   risperiDONE (RISPERDAL) 0.5 MG tablet Take 0.5 mg by mouth 2 (two) times daily. 0800/1600   07/17/2023 at  4:00 PM   topiramate (TOPAMAX) 50 MG tablet Take 1 tablet (50 mg total) by mouth 2 (two) times daily. (Patient taking differently: Take 50 mg by mouth at bedtime.) 60 tablet 1 07/17/2023 at  8:00 PM   traZODone (DESYREL) 50 MG tablet Take 50 mg by mouth at bedtime.   07/17/2023 at  8:00 PM   etonogestrel (NEXPLANON)  68 MG IMPL implant 1 each (68 mg total) by Subdermal route once for 1 dose. 1 each 0     Patient Stressors:    Patient Strengths:    Treatment Modalities: Medication Management, Group therapy, Case management,  1 to 1 session with clinician, Psychoeducation, Recreational therapy.   Physician Treatment Plan for Primary Diagnosis: Bipolar I disorder, most recent episode mixed (HCC) Long Term Goal(s): Improvement in symptoms so as ready for discharge   Short Term Goals: Ability to identify changes in lifestyle to reduce recurrence of condition will improve Ability to verbalize feelings will improve Ability to disclose and discuss suicidal ideas Ability to demonstrate self-control will improve Ability to identify and develop effective coping behaviors will improve Ability to maintain clinical measurements within normal limits will improve Compliance with prescribed medications will improve Ability to identify triggers associated with substance abuse/mental  health issues will improve  Medication Management: Evaluate patient's response, side effects, and tolerance of medication regimen.  Therapeutic Interventions: 1 to 1 sessions, Unit Group sessions and Medication administration.  Evaluation of Outcomes: Progressing  Physician Treatment Plan for Secondary Diagnosis: Principal Problem:   Bipolar I disorder, most recent episode mixed (HCC) Active Problems:   PTSD (post-traumatic stress disorder)   HIV disease (HCC)   Thrombosed hemorrhoids   Abdominal pain  Long Term Goal(s): Improvement in symptoms so as ready for discharge   Short Term Goals: Ability to identify changes in lifestyle to reduce recurrence of condition will improve Ability to verbalize feelings will improve Ability to disclose and discuss suicidal ideas Ability to demonstrate self-control will improve Ability to identify and develop effective coping behaviors will improve Ability to maintain clinical measurements within normal limits will improve Compliance with prescribed medications will improve Ability to identify triggers associated with substance abuse/mental health issues will improve     Medication Management: Evaluate patient's response, side effects, and tolerance of medication regimen.  Therapeutic Interventions: 1 to 1 sessions, Unit Group sessions and Medication administration.  Evaluation of Outcomes: Progressing   RN Treatment Plan for Primary Diagnosis: Bipolar I disorder, most recent episode mixed (HCC) Long Term Goal(s): Knowledge of disease and therapeutic regimen to maintain health will improve  Short Term Goals: Ability to remain free from injury will improve, Ability to verbalize frustration and anger appropriately will improve, Ability to demonstrate self-control, Ability to participate in decision making will improve, Ability to verbalize feelings will improve, Ability to disclose and discuss suicidal ideas, Ability to identify and develop  effective coping behaviors will improve, and Compliance with prescribed medications will improve  Medication Management: RN will administer medications as ordered by provider, will assess and evaluate patient's response and provide education to patient for prescribed medication. RN will report any adverse and/or side effects to prescribing provider.  Therapeutic Interventions: 1 on 1 counseling sessions, Psychoeducation, Medication administration, Evaluate responses to treatment, Monitor vital signs and CBGs as ordered, Perform/monitor CIWA, COWS, AIMS and Fall Risk screenings as ordered, Perform wound care treatments as ordered.  Evaluation of Outcomes: Progressing   LCSW Treatment Plan for Primary Diagnosis: Bipolar I disorder, most recent episode mixed (HCC) Long Term Goal(s): Safe transition to appropriate next level of care at discharge, Engage patient in therapeutic group addressing interpersonal concerns.  Short Term Goals: Engage patient in aftercare planning with referrals and resources, Increase social support, Increase ability to appropriately verbalize feelings, Increase emotional regulation, Facilitate acceptance of mental health diagnosis and concerns, Facilitate patient progression through stages of change regarding substance use diagnoses  and concerns, Identify triggers associated with mental health/substance abuse issues, and Increase skills for wellness and recovery  Therapeutic Interventions: Assess for all discharge needs, 1 to 1 time with Social worker, Explore available resources and support systems, Assess for adequacy in community support network, Educate family and significant other(s) on suicide prevention, Complete Psychosocial Assessment, Interpersonal group therapy.  Evaluation of Outcomes: Progressing   Progress in Treatment: Attending groups: Yes. Participating in groups: Yes. Taking medication as prescribed: Yes. Toleration medication: Yes. Family/Significant  other contact made: Yes, individual(s) contacted:  CSW completed with [name] [name]/guardian [phone]) on 07/20/2023 Patient understands diagnosis: Yes. Discussing patient identified problems/goals with staff: Yes. Medical problems stabilized or resolved: Yes. Denies suicidal/homicidal ideation: Yes. Issues/concerns per patient self-inventory: Yes. Other:   New problem(s) identified: No, Describe:  none identified.   New Short Term/Long Term Goal(s): medication management for mood stabilization; elimination of SI thoughts; development of comprehensive mental wellness plan. Update 07/24/23: No changes at this time. Update: 07/29/2023: No changes at this time.   Patient Goals:  Pt declined to participate in treatment team meeting. Update 07/24/23: No changes at this time.   Discharge Plan or Barriers: CSW will assist pt/guardian with development of an appropriate aftercare/discharge plan. Update 07/24/23: Guardian notified team that pt will not be returning to group home. Update: 07/29/2023: No changes at this time.   Reason for Continuation of Hospitalization: Depression Medication stabilization Suicidal ideation   Estimated Length of Stay: 1-7 days Update 07/24/23: No changes at this time. Update: 07/29/2023: No changes at this time.  Last 3 [place] Suicide Severity Risk Score: Flowsheet Row Admission (Current) from 07/18/2023 in [facility]ICINE ED from 07/11/2023 in [facility] Emergency Department at [facility] Admission (Discharged) from 07/05/2023 in [facility] INPATIENT BE[facility] MEDICINE  C-SSRS RISK CATEGORY High Risk Error: Q3, 4, or 5 should not be populated when Q2 is No High Risk       Last PHQ 2/9 Scores:    05/04/2023    9:21 AM 02/16/2023    9:49 AM 11/17/2022   10:53 AM  Depression screen PHQ 2/9  Decreased Interest 0 0 0  Down, Depressed, Hopeless 0 0 1  PHQ - 2 Score 0 0 1    Scribe for Treatment Team: Rhett Bannister 07/29/2023 10:12  AM

## 2023-07-29 NOTE — Group Note (Signed)
Date:  07/29/2023 Time:  2:32 PM  Group Topic/Focus:  Activity Group: The focus of the group is to promote activity for the patients and to encourage them to go outside to the courtyard for some fresh air and some exercise.    Participation Level:  Active  Participation Quality:  Appropriate  Affect:  Appropriate  Cognitive:  Appropriate  Insight: Appropriate  Engagement in Group:  Engaged  Modes of Intervention:  Activity  Additional Comments:    Crystal Black 07/29/2023, 2:32 PM

## 2023-07-30 DIAGNOSIS — F3160 Bipolar disorder, current episode mixed, unspecified: Secondary | ICD-10-CM | POA: Diagnosis not present

## 2023-07-30 MED ORDER — DOCUSATE SODIUM 100 MG PO CAPS
200.0000 mg | ORAL_CAPSULE | Freq: Every day | ORAL | Status: DC
  Administered 2023-07-31 – 2023-09-07 (×32): 200 mg via ORAL
  Filled 2023-07-30 (×38): qty 2

## 2023-07-30 MED ORDER — POLYETHYLENE GLYCOL 3350 17 G PO PACK
17.0000 g | PACK | Freq: Every day | ORAL | Status: DC
  Administered 2023-07-30 – 2023-09-06 (×25): 17 g via ORAL
  Filled 2023-07-30 (×37): qty 1

## 2023-07-30 NOTE — Progress Notes (Signed)
Patient is now asking for her ankle to be wrapped. This Clinical research associate explained to patient that there aren't any supplies on the unit at this time. This writer attempted to call supply chain, but the line has been busy.

## 2023-07-30 NOTE — Progress Notes (Signed)
   07/30/23 1800  Psych Admission Type (Psych Patients Only)  Admission Status Voluntary  Psychosocial Assessment  Patient Complaints Anxiety (patient states "I have anxiety almost every day".)  Eye Contact Fair;Watchful  Facial Expression Anxious;Worried  Affect Anxious;Appropriate to circumstance  Speech Logical/coherent  Interaction Assertive;Childlike;Attention-seeking;Needy  Motor Activity Slow (chairfast)  Appearance/Hygiene Unremarkable  Behavior Characteristics Cooperative;Appropriate to situation  Mood Anxious;Pleasant (patient states "I'm doing alright, I just be tired a lot".)  Aggressive Behavior  Effect No apparent injury  Thought Process  Coherency WDL  Content WDL  Delusions None reported or observed  Perception WDL  Hallucination None reported or observed  Judgment WDL  Confusion None  Danger to Self  Current suicidal ideation? Denies  Self-Injurious Behavior No self-injurious ideation or behavior indicators observed or expressed   Agreement Not to Harm Self Yes  Description of Agreement Verbal  Danger to Others  Danger to Others None reported or observed

## 2023-07-30 NOTE — Progress Notes (Signed)
1:1 Patient Monitoring (while awake):  0830: Patient is in the dayroom, eating breakfast, with her assigned safety sitter present.  1130: Patient is in the dayroom, waiting for lunch, with the other members on the unit and her assigned safety sitter.  1230: Patient is in her room, in the bathroom, with her assigned safety sitter present.  1330: Patient is in the dayroom, playing cards with another patient on the unit, with her assigned safety sitter present.  1430: Patient is in the dayroom, with other members on the unit, and her assigned Recruitment consultant.  1530: Patient remains in the dayroom, playing card games with another female patient. Assigned Recruitment consultant present.  1630: Patient is in the dayroom, eating dinner, with her assigned safety sitter present.  1730: Patient remains in the dayroom, playing cards, with her assigned safety sitter present.  1830: Patient is in the nurses station, with this writer, receiving pain medication.
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
## 2023-07-30 NOTE — [facility] Group Notes (Signed)
[facility] Group Notes:  (Nursing/MHT/Case Management/Adjunct) Adult Psychoeducational Group Note  Date:  07/30/2023 Time:  10:17 AM  Group Topic/Focus:  Goals Group:   The focus of this group is to help patients establish daily goals to achieve during treatment and discuss how the patient can incorporate goal setting into their daily lives to aide in recovery. Orientation:   The focus of this group is to educate the patient on the purpose and policies of crisis stabilization and provide a format to answer questions about their admission.  The group details unit policies and expectations of patients while admitted.  Participation Level:  Did Not Attend    Additional Comments:  Pt did not attend.   Lucilla Edin 07/30/2023, 10:17 AM

## 2023-07-30 NOTE — Progress Notes (Signed)
Patient had complaints of being depressed and having flashbacks of her past traumas. Per patient, "is there anything that you can give me, because I don't want to become suicidal again". Patient was given PRN Haldol/Benadryl to help calm her and relax her thoughts. Patient tolerated medication well, without any issues. Patient remains safe on the unit, under 1:1 patient monitoring, while awake.   07/30/23 1455  Complaints & Interventions  Complains of Agitation;Anxiety;Other (Comment) (depression and flashbacks, per patient)  Interventions Medication (see MAR)

## 2023-07-30 NOTE — Progress Notes (Signed)
   07/29/23 2000  Psych Admission Type (Psych Patients Only)  Admission Status Voluntary  Psychosocial Assessment  Patient Complaints Anxiety;Depression  Eye Contact Fair  Facial Expression Flat  Affect Apprehensive;Irritable  Speech Logical/coherent  Interaction Minimal;Needy  Motor Activity Slow  Appearance/Hygiene Improved  Behavior Characteristics Cooperative;Appropriate to situation  Mood Preoccupied  Thought Process  Coherency Concrete thinking  Content Preoccupation  Delusions None reported or observed  Perception WDL  Hallucination None reported or observed  Judgment Limited  Confusion None  Danger to Self  Current suicidal ideation? Denies  Danger to Others  Danger to Others None reported or observed   Patient alert and oriented x 4, affect is blunted, thought are organized, patient is on 1:1 while awake, no distress noted will continue to closely monitor.

## 2023-07-30 NOTE — Group Note (Signed)
Date:  07/30/2023 Time:  8:50 PM  Group Topic/Focus:  Self Care:   The focus of this group is to help patients understand the importance of self-care in order to improve or restore emotional, physical, spiritual, interpersonal, and financial health.    Participation Level:  Active  Participation Quality:  Appropriate, Attentive, and Sharing  Affect:  Appropriate  Cognitive:  Appropriate  Insight: Appropriate  Engagement in Group:  Supportive  Modes of Intervention:  Discussion  Additional Comments:     [name] 07/30/2023, 8:50 PM

## 2023-07-30 NOTE — Plan of Care (Signed)
   Problem: Education: Goal: Knowledge of [facility] Education information/materials will improve Outcome: Progressing Goal: Emotional status will improve Outcome: Progressing Goal: Mental status will improve Outcome: Progressing Goal: Verbalization of understanding the information provided will improve Outcome: Progressing   Problem: Activity: Goal: Interest or engagement in activities will improve Outcome: Progressing Goal: Sleeping patterns will improve Outcome: Progressing   Problem: Coping: Goal: Ability to verbalize frustrations and anger appropriately will improve Outcome: Progressing Goal: Ability to demonstrate self-control will improve Outcome: Progressing   Problem: Health Behavior/Discharge Planning: Goal: Identification of resources available to assist in meeting health care needs will improve Outcome: Progressing Goal: Compliance with treatment plan for underlying cause of condition will improve Outcome: Progressing   Problem: Physical Regulation: Goal: Ability to maintain clinical measurements within normal limits will improve Outcome: Progressing   Problem: Safety: Goal: Periods of time without injury will increase Outcome: Progressing   Problem: Education: Goal: Utilization of techniques to improve thought processes will improve Outcome: Progressing Goal: Knowledge of the prescribed therapeutic regimen will improve Outcome: Progressing   Problem: Activity: Goal: Interest or engagement in leisure activities will improve Outcome: Progressing Goal: Imbalance in normal sleep/wake cycle will improve Outcome: Progressing   Problem: Coping: Goal: Coping ability will improve Outcome: Progressing Goal: Will verbalize feelings Outcome: Progressing   Problem: Health Behavior/Discharge Planning: Goal: Ability to make decisions will improve Outcome: Progressing Goal: Compliance with therapeutic regimen will improve Outcome: Progressing    Problem: Role Relationship: Goal: Will demonstrate positive changes in social behaviors and relationships Outcome: Progressing   Problem: Safety: Goal: Ability to disclose and discuss suicidal ideas will improve Outcome: Progressing Goal: Ability to identify and utilize support systems that promote safety will improve Outcome: Progressing   Problem: Self-Concept: Goal: Will verbalize positive feelings about self Outcome: Progressing Goal: Level of anxiety will decrease Outcome: Progressing   Problem: Education: Goal: Knowledge of General Education information will improve Description: Including pain rating scale, medication(s)/side effects and non-pharmacologic comfort measures Outcome: Progressing   Problem: Health Behavior/Discharge Planning: Goal: Ability to manage health-related needs will improve Outcome: Progressing

## 2023-07-31 DIAGNOSIS — F3160 Bipolar disorder, current episode mixed, unspecified: Secondary | ICD-10-CM | POA: Diagnosis not present

## 2023-07-31 LAB — GLUCOSE, CAPILLARY
Glucose-Capillary: 175 mg/dL — ABNORMAL HIGH (ref 70–99)
Glucose-Capillary: 126 mg/dL — ABNORMAL HIGH (ref 70–99)
Glucose-Capillary: 173 mg/dL — ABNORMAL HIGH (ref 70–99)
Glucose-Capillary: 203 mg/dL — ABNORMAL HIGH (ref 70–99)
Glucose-Capillary: 122 mg/dL — ABNORMAL HIGH (ref 70–99)
Glucose-Capillary: 114 mg/dL — ABNORMAL HIGH (ref 70–99)
Glucose-Capillary: 87 mg/dL (ref 70–99)
Glucose-Capillary: 167 mg/dL — ABNORMAL HIGH (ref 70–99)
Glucose-Capillary: 108 mg/dL — ABNORMAL HIGH (ref 70–99)
Glucose-Capillary: 142 mg/dL — ABNORMAL HIGH (ref 70–99)

## 2023-07-31 NOTE — Group Note (Signed)
Date:  07/31/2023 Time:  4:43 PM  Group Topic/Focus:  Outdoor recreation therapy    Participation Level:  Active  Participation Quality:  Appropriate  Affect:  Appropriate  Cognitive:  Appropriate  Insight: Appropriate  Engagement in Group:  Developing/Improving  Modes of Intervention:  Activity and Discussion  Additional Comments:    Crystal Black 07/31/2023, 4:43 PM

## 2023-07-31 NOTE — Plan of Care (Signed)
   Problem: Education: Goal: Emotional status will improve Outcome: Progressing Goal: Mental status will improve Outcome: Progressing Goal: Verbalization of understanding the information provided will improve Outcome: Progressing

## 2023-07-31 NOTE — Progress Notes (Signed)
   07/31/23 1027  Psych Admission Type (Psych Patients Only)  Admission Status Voluntary  Psychosocial Assessment  Patient Complaints None  Eye Contact Fair  Facial Expression Animated  Affect Anxious  Speech Logical/coherent  Interaction Childlike;Attention-seeking;Needy  Motor Activity Slow  Appearance/Hygiene Unremarkable  Behavior Characteristics Cooperative  Mood Anxious;Preoccupied  Thought Process  Coherency Concrete thinking  Content Preoccupation  Delusions None reported or observed  Perception WDL  Hallucination None reported or observed  Judgment Limited  Confusion None  Danger to Self  Current suicidal ideation? Denies  Agreement Not to Harm Self Yes  Description of Agreement Verbal  Danger to Others  Danger to Others None reported or observed

## 2023-07-31 NOTE — Plan of Care (Signed)
  Problem: Education: Goal: Mental status will improve Outcome: Progressing   Problem: Education: Goal: Verbalization of understanding the information provided will improve Outcome: Progressing   Problem: Coping: Goal: Ability to verbalize frustrations and anger appropriately will improve Outcome: Progressing

## 2023-07-31 NOTE — Group Note (Signed)
Date:  07/31/2023 Time:  11:07 PM  Group Topic/Focus:  Wrap-Up Group:   The focus of this group is to help patients review their daily goal of treatment and discuss progress on daily workbooks.    Participation Level:  Active  Participation Quality:  Appropriate and Attentive  Affect:  Appropriate  Cognitive:  Alert and Appropriate  Insight: Appropriate  Engagement in Group:  Engaged  Modes of Intervention:  Discussion  Additional Comments:     Maglione,Ethelyn Cerniglia E 07/31/2023, 11:07 PM

## 2023-07-31 NOTE — Progress Notes (Signed)
   07/30/23 2000  Psych Admission Type (Psych Patients Only)  Admission Status Voluntary  Psychosocial Assessment  Patient Complaints Irritability;Suspiciousness  Eye Contact Fair  Facial Expression Flat  Affect Apprehensive;Irritable  Speech Logical/coherent  Interaction Minimal;Needy;Attention-seeking;Childlike  Motor Activity Slow  Appearance/Hygiene Improved  Behavior Characteristics Cooperative  Mood Anxious;Preoccupied  Thought Process  Coherency Concrete thinking  Content Preoccupation  Delusions None reported or observed  Perception WDL  Hallucination None reported or observed  Judgment Limited  Confusion None  Danger to Self  Current suicidal ideation? Denies  Danger to Others  Danger to Others None reported or observed   Patient alert and oriented x 4, she appears less anxious, on 1:1 for safety , denies SI/HI/AVH no distress noted, interacting appropriately with peers and staff.

## 2023-07-31 NOTE — Group Note (Signed)
Recreation Therapy Group Note   Group Topic:Coping Skills  Group Date: 07/31/2023 Start Time: 1000 End Time: 1045 Facilitators: Rosina Lowenstein, LRT, CTRS Location:  Craft Room  Group Description: Mind Map.  Patient was provided a blank template of a diagram with 32 blank boxes in a tiered system, branching from the center (similar to a bubble chart). LRT directed patients to label the middle of the diagram "Coping Skills". LRT and patients then came up with 8 different coping skills as examples. Pt were directed to record their coping skills in the 2nd tier boxes closest to the center.  Patients would then share their coping skills with the group as LRT wrote them out. LRT gave a handout of 99 different coping skills at the end of group.   Goal Area(s) Addressed: Patients will be able to define "coping skills". Patient will identify new coping skills.  Patient will increase communication.   Affect/Mood: Appropriate   Participation Level: Minimal    Clinical Observations/Individualized Feedback: Lilee came late to group and left early to speak with nurses. Pt did not complete the handout, as she has done this intervention before. Pt took with her the printed out handout of 99 different coping skills.   Plan: Continue to engage patient in RT group sessions 2-3x/week.   Rosina Lowenstein, LRT, CTRS 07/31/2023 12:15 PM
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
## 2023-07-31 NOTE — Group Note (Signed)
LCSW Group Therapy Note  Group Date: 07/31/2023 Start Time: 1315 End Time: 1400   Type of Therapy and Topic:  Group Therapy: Anxiety    Participation Level:  Active     Description of Group:   In this group, patients learned how to recognize the physical, cognitive, emotional, and behavioral responses they have to anxiety-provoking situations.  They identified a recent time they became anxious and how they reacted.  The group discussed the sytmptoims of anxiety.  The group discussed the avoidance cycle of anxiety .  The group discussed healthy coping skills that could help with anxiety in the future.  Focus was placed on how helpful it is to recognize the underlying emotions to our anxiety, because working on those can lead to a more permanent solution as well as our ability to focus on the important rather than the urgent.   Therapeutic Goals: Patients will remember their last incident of anxiety and how they felt emotionally and physically, what their thoughts were at the time, and how they behaved. Patients will identify how their behavior at that time worked for them, as well as how it worked against them. Patients will explore possible new behaviors to use in future anxiety inducing situations. Patients will learn that anxiety itself is normal and cannot be eliminated, and that healthier reactions can assist with resolving conflict rather than worsening situations.  Summary of Patient Progress:   Patient was active during the group. She engaged in discussion on coping skills.  She was able to discuss how she uses deep breathing as a coping skills.  She discussed with others in the group how one has to have the resolve to change their behaviors and can not rely on medication alone.  She demonstrated fair insight into the subject matter, was respectful of peers, and participated throughout the entire session.  Therapeutic Modalities:   Cognitive Behavioral Therapy  Harden Mo,  LCSW 07/31/2023  2:21 PM

## 2023-08-01 DIAGNOSIS — F3160 Bipolar disorder, current episode mixed, unspecified: Secondary | ICD-10-CM | POA: Diagnosis not present

## 2023-08-01 LAB — GLUCOSE, CAPILLARY
Glucose-Capillary: 133 mg/dL — ABNORMAL HIGH (ref 70–99)
Glucose-Capillary: 193 mg/dL — ABNORMAL HIGH (ref 70–99)
Glucose-Capillary: 189 mg/dL — ABNORMAL HIGH (ref 70–99)

## 2023-08-01 MED ORDER — MAGNESIUM CITRATE PO SOLN
1.0000 | Freq: Once | ORAL | Status: AC
  Administered 2023-08-01: 1 via ORAL
  Filled 2023-08-01: qty 296

## 2023-08-01 NOTE — Progress Notes (Signed)
Being that [facility] has yet to transfer over to patient chart, this writer is manually entering the results from review of the glucometer.   08/01/23 1626  Point of Care Tests  CB[facility] -Manual entry (!) 181

## 2023-08-01 NOTE — Group Note (Signed)
 Recreation Therapy Group Note   Group Topic:Goal Setting  Group Date: 08/01/2023 Start Time: 1000 End Time: 1100 Facilitators: Rosina Lowenstein, LRT, CTRS Location:  Craft Room  Group Description: Product/process development scientist. Patients were given many different magazines, a glue stick, markers, and a piece of cardstock paper. LRT and pts discussed the importance of having goals in life. LRT and pts discussed the difference between short-term and long-term goals, as well as what a SMART goal is. LRT encouraged pts to create a vision board, with images they picked and then cut out with safety scissors from the magazine, for themselves, that capture their short and long-term goals. LRT encouraged pts to show and explain their vision board to the group.   Goal Area(s) Addressed:  Patient will gain knowledge of short vs. long term goals.  Patient will identify goals for themselves. Patient will practice setting SMART goals. Patient will verbalize their goals to LRT and peers.   Affect/Mood: N/A   Participation Level: Did not attend    Clinical Observations/Individualized Feedback: Patient did not attend group.   Plan: Continue to engage patient in RT group sessions 2-3x/week.   Rosina Lowenstein, LRT, CTRS 08/01/2023 12:18 PM

## 2023-08-01 NOTE — Progress Notes (Signed)
Patient is on 1:1 while awake with a sitter present. Pt is without concern at this time and remains safe on the unit

## 2023-08-01 NOTE — Plan of Care (Signed)
 ?  Problem: Education: ?Goal: Mental status will improve ?Outcome: Progressing ?Goal: Verbalization of understanding the information provided will improve ?Outcome: Progressing ?  ?
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
## 2023-08-01 NOTE — Group Note (Signed)
 Date:  08/01/2023 Time:  6:20 PM  Group Topic/Focus:  OUTDOOR RECREATION THERAPY    Participation Level:  Active  Participation Quality:  Appropriate  Affect:  Appropriate  Cognitive:  Appropriate  Insight: Appropriate  Engagement in Group:  Developing/Improving  Modes of Intervention:  Activity  Additional Comments:    Mattison Golay 08/01/2023, 6:20 PM

## 2023-08-01 NOTE — Group Note (Signed)
 Date:  08/01/2023 Time:  8:49 PM  Group Topic/Focus:  Wrap-Up Group:   The focus of this group is to help patients review their daily goal of treatment and discuss progress on daily workbooks.    Participation Level:  Active  Participation Quality:  Appropriate and Attentive  Affect:  Appropriate  Cognitive:  Alert and Appropriate  Insight: Appropriate and Good  Engagement in Group:  Engaged and Improving  Modes of Intervention:  Clarification, Discussion, Orientation, Rapport Building, and Support  Additional Comments:     Crystal Black 08/01/2023, 8:49 PM

## 2023-08-01 NOTE — Progress Notes (Signed)
Patient received morning dose of Depakote late, due to patient being asleep; 1700 dose was moved to 2200, per NP request. This will be passed along to oncoming shift as well.

## 2023-08-01 NOTE — Progress Notes (Signed)
 1:1 Patient Monitoring (while awake)  0745: Patient is in the dayroom, waiting for breakfast and playing cards with other members on the unit. Patient's assigned safety sitter is present.  1245: Patient is in the medication room, receiving noon medications, with this clinical research associate and the other staff nurse at her side.  1345: Patient is in group, with other members on the unit, and staff.  1445: Patient is in the bathroom, changing clothes, with this writer standing by.  1545: Patient is outside, in the courtyard, with staff and the other members on the unit.  1645: Patient is in the dayroom, eating dinner, with staff present and other members on the unit.  1745: Patient is in the dayroom with staff and other members on the unit, watching TV.  1845: Patient is in the dayroom with staff and playing cards with other members on the unit.

## 2023-08-01 NOTE — Plan of Care (Signed)
  Problem: Education: Goal: Knowledge of [facility] [place] [facility] Education information/materials will improve Outcome: Progressing Goal: Emotional status will improve Outcome: Progressing Goal: Mental status will improve Outcome: Progressing Goal: Verbalization of understanding the information provided will improve Outcome: Progressing   Problem: Activity: Goal: Interest or engagement in activities will improve Outcome: Progressing Goal: Sleeping patterns will improve Outcome: Progressing   Problem: Coping: Goal: Ability to verbalize frustrations and anger appropriately will improve Outcome: Progressing Goal: Ability to demonstrate self-control will improve Outcome: Progressing   Problem: Health Behavior/Discharge Planning: Goal: Identification of resources available to assist in meeting health care needs will improve Outcome: Progressing Goal: Compliance with treatment plan for underlying cause of condition will improve Outcome: Progressing   Problem: Physical Regulation: Goal: Ability to maintain clinical measurements within normal limits will improve Outcome: Progressing   Problem: Safety: Goal: Periods of time without injury will increase Outcome: Progressing   Problem: Education: Goal: Utilization of techniques to improve thought processes will improve Outcome: Progressing Goal: Knowledge of the prescribed therapeutic regimen will improve Outcome: Progressing   Problem: Activity: Goal: Interest or engagement in leisure activities will improve Outcome: Progressing Goal: Imbalance in normal sleep/wake cycle will improve Outcome: Progressing   Problem: Coping: Goal: Coping ability will improve Outcome: Progressing Goal: Will verbalize feelings Outcome: Progressing   Problem: Health Behavior/Discharge Planning: Goal: Ability to make decisions will improve Outcome: Progressing Goal: Compliance with therapeutic regimen will improve Outcome: Progressing    Problem: Role Relationship: Goal: Will demonstrate positive changes in social behaviors and relationships Outcome: Progressing   Problem: Safety: Goal: Ability to disclose and discuss suicidal ideas will improve Outcome: Progressing Goal: Ability to identify and utilize support systems that promote safety will improve Outcome: Progressing   Problem: Self-Concept: Goal: Will verbalize positive feelings about self Outcome: Progressing Goal: Level of anxiety will decrease Outcome: Progressing   Problem: Education: Goal: Knowledge of General Education information will improve Description: Including pain rating scale, medication(s)/side effects and non-pharmacologic comfort measures Outcome: Progressing   Problem: Health Behavior/Discharge Planning: Goal: Ability to manage health-related needs will improve Outcome: Progressing

## 2023-08-01 NOTE — Progress Notes (Signed)
Patient is asleep, so, this writer will administer scheduled medications once she wakes up. NP is aware.

## 2023-08-01 NOTE — Progress Notes (Signed)
   08/01/23 0300  Psych Admission Type (Psych Patients Only)  Admission Status Voluntary  Psychosocial Assessment  Patient Complaints None  Eye Contact Brief  Facial Expression Animated  Affect Anxious  Speech Logical/coherent  Interaction Childlike;Attention-seeking  Motor Activity Slow  Appearance/Hygiene Unremarkable  Behavior Characteristics Cooperative  Mood Anxious;Preoccupied  Thought Process  Coherency WDL  Content Preoccupation  Delusions None reported or observed  Perception WDL  Hallucination None reported or observed  Judgment Limited  Confusion None  Danger to Self  Current suicidal ideation? Denies  Self-Injurious Behavior Self-injurious ideation verbalized  Agreement Not to Harm Self Yes  Description of Agreement Verbal  Danger to Others  Danger to Others None reported or observed

## 2023-08-01 NOTE — Group Note (Signed)
 Date:  08/01/2023 Time:  10:52 AM  Group Topic/Focus:   Goals Group:   The focus of this group is to help patients establish daily goals to achieve during treatment and discuss how the patient can incorporate goal setting into their daily lives to aide in recovery.  Managing Feelings:   The focus of this group is to identify what feelings patients have difficulty handling and develop a plan to handle them in a healthier way upon discharge.   Participation Level:  Did Not Attend   Crystal Black 08/01/2023, 10:52 AM

## 2023-08-01 NOTE — Group Note (Signed)
 LCSW Group Therapy Note   Group Date: 08/01/2023 Start Time: 1300 End Time: 1400   Type of Therapy and Topic:  Group Therapy: Challenging Core Beliefs  Participation Level:  Minimal  Description of Group:  Patients were educated about core beliefs and asked to identify one harmful core belief that they have. Patients were asked to explore from where those beliefs originate. Patients were asked to discuss how those beliefs make them feel and the resulting behaviors of those beliefs. They were then be asked if those beliefs are true and, if so, what evidence they have to support them. Lastly, group members were challenged to replace those negative core beliefs with helpful beliefs.   Therapeutic Goals:   1. Patient will identify harmful core beliefs and explore the origins of such beliefs. 2. Patient will identify feelings and behaviors that result from those core beliefs. 3. Patient will discuss whether such beliefs are true. 4.  Patient will replace harmful core beliefs with helpful ones.  Summary of Patient Progress:  Patient minimally engaged in processing and exploring how core beliefs are formed and how they impact thoughts, feelings, and behaviors. Patient proved open to input from peers and feedback from CSW. Patient demonstrated basic insight into the subject matter, was respectful and supportive of peers, and participated throughout the entire session.  Therapeutic Modalities: Cognitive Behavioral Therapy; Solution-Focused Therapy   Noela Brothers M Paizleigh Wilds, LCSWA 08/01/2023  1:56 PM

## 2023-08-01 NOTE — Progress Notes (Signed)
   08/01/23 1600  Psych Admission Type (Psych Patients Only)  Admission Status Voluntary  Psychosocial Assessment  Patient Complaints Anxiety;Depression (patient states that she is feeling this way because the other patients are talking about her, saying that she was faking her ankle pain.)  Eye Contact Fair;Staring;Watchful  Facial Expression Anxious  Affect Anxious  Speech Logical/coherent;Soft  Interaction Attention-seeking;Childlike;Needy  Motor Activity Slow;Fidgety  Appearance/Hygiene Unremarkable  Behavior Characteristics Cooperative;Appropriate to situation  Mood Anxious;Preoccupied;Pleasant (patient preoccupied with bowels and new medication orders.)  Aggressive Behavior  Effect No apparent injury  Thought Process  Coherency WDL  Content Preoccupation  Delusions None reported or observed  Perception WDL  Hallucination None reported or observed  Judgment Limited  Confusion None  Danger to Self  Current suicidal ideation? Denies  Self-Injurious Behavior No self-injurious ideation or behavior indicators observed or expressed   Agreement Not to Harm Self Yes  Description of Agreement Verbal  Danger to Others  Danger to Others None reported or observed

## 2023-08-02 DIAGNOSIS — F3160 Bipolar disorder, current episode mixed, unspecified: Secondary | ICD-10-CM | POA: Diagnosis not present

## 2023-08-02 LAB — GLUCOSE, CAPILLARY
Glucose-Capillary: 181 mg/dL — ABNORMAL HIGH (ref 70–99)
Glucose-Capillary: 235 mg/dL — ABNORMAL HIGH (ref 70–99)
Glucose-Capillary: 122 mg/dL — ABNORMAL HIGH (ref 70–99)

## 2023-08-02 LAB — URINE DRUG SCREEN, QUALITATIVE (ARMC ONLY)
Amphetamines, Ur Screen: NOT DETECTED
Barbiturates, Ur Screen: NOT DETECTED
Benzodiazepine, Ur Scrn: NOT DETECTED
Cannabinoid 50 Ng, Ur ~~LOC~~: NOT DETECTED
Cocaine Metabolite,Ur ~~LOC~~: NOT DETECTED
MDMA (Ecstasy)Ur Screen: NOT DETECTED
Methadone Scn, Ur: NOT DETECTED
Opiate, Ur Screen: NOT DETECTED
Phencyclidine (PCP) Ur S: NOT DETECTED
Tricyclic, Ur Screen: NOT DETECTED

## 2023-08-02 LAB — VALPROIC ACID LEVEL: Valproic Acid Lvl: 53 ug/mL (ref 50.0–100.0)

## 2023-08-02 MED ORDER — DIVALPROEX SODIUM ER 500 MG PO TB24
500.0000 mg | ORAL_TABLET | Freq: Two times a day (BID) | ORAL | Status: DC
Start: 2023-08-02 — End: 2023-09-08
  Administered 2023-08-02 – 2023-09-08 (×74): 500 mg via ORAL
  Filled 2023-08-02 (×75): qty 1

## 2023-08-02 MED ORDER — FLUOXETINE HCL 20 MG PO CAPS
20.0000 mg | ORAL_CAPSULE | Freq: Every day | ORAL | Status: DC
Start: 2023-08-03 — End: 2023-09-08
  Administered 2023-08-03 – 2023-09-08 (×37): 20 mg via ORAL
  Filled 2023-08-02 (×37): qty 1

## 2023-08-02 NOTE — Progress Notes (Signed)
 Patient provided with specimen cup to provide a urine sample, per NP orders. Patient was informed to bring the sample up as soon as she can.

## 2023-08-02 NOTE — Group Note (Signed)
 [facility] LCSW Group Therapy Note   Group Date: 08/02/2023 Start Time: 1300 End Time: 1345   Type of Therapy/Topic:  Group Therapy:  Emotion Regulation  Participation Level:  Did Not Attend    Description of Group:    The purpose of this group is to assist patients in learning to regulate negative emotions and experience positive emotions. Patients will be guided to discuss ways in which they have been vulnerable to their negative emotions. These vulnerabilities will be juxtaposed with experiences of positive emotions or situations, and patients challenged to use positive emotions to combat negative ones. Special emphasis will be placed on coping with negative emotions in conflict situations, and patients will process healthy conflict resolution skills.  Therapeutic Goals: Patient will identify two positive emotions or experiences to reflect on in order to balance out negative emotions:  Patient will label two or more emotions that they find the most difficult to experience:  Patient will be able to demonstrate positive conflict resolution skills through discussion or role plays:   Summary of Patient Progress: X   Therapeutic Modalities:   Cognitive Behavioral Therapy Feelings Identification Dialectical Behavioral Therapy   Nadara JONELLE Fam, LCSW
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
## 2023-08-02 NOTE — Progress Notes (Signed)
 Pt on 1:1 while awake and had a sitter present until she fell asleep. Pt compliant with medication administration per MD orders. Pt observed interacting appropriately with staff and peers on the unit. Pt compliant with medication administration per MD orders. Pt given education, support, and encouragement to be active in her treatment plan. Pt being monitored Q 15 minutes for safety per unit protocol, remains safe on the unit

## 2023-08-02 NOTE — Progress Notes (Signed)
   08/02/23 1800  Psych Admission Type (Psych Patients Only)  Admission Status Voluntary  Psychosocial Assessment  Patient Complaints Anxiety (patient states basically, what happened yesterday, is why she's anxious today.)  Eye Contact Fair;Watchful  Facial Expression Anxious;Worried  Affect Preoccupied  Systems Analyst;Soft  Interaction Assertive;Attention-seeking;Childlike;Needy  Motor Activity Slow;Fidgety  Appearance/Hygiene In scrubs;Unremarkable  Behavior Characteristics Cooperative;Appropriate to situation  Mood Anxious;Pleasant (patient's goal for today is to stay to herself and relax.)  Aggressive Behavior  Effect No apparent injury  Thought Process  Coherency WDL  Content Preoccupation  Delusions None reported or observed  Perception WDL  Hallucination None reported or observed  Judgment WDL  Confusion None  Danger to Self  Current suicidal ideation? Denies  Self-Injurious Behavior No self-injurious ideation or behavior indicators observed or expressed   Agreement Not to Harm Self Yes  Description of Agreement Verbal  Danger to Others  Danger to Others None reported or observed

## 2023-08-02 NOTE — Progress Notes (Signed)
 1:1 Patient Monitoring (while awake)  1030: Patient is in the medication room, with this writer, receiving scheduled morning medications. Patient's safety sitter is present also.  1130: Patient is in the dayroom, with staff and other members on the unit, waiting for lunch to arrive.  1230: Patient is in her room, talking to her assigned safety sitter.  1330: Patient is in her room, talking to this clinical research associate, as her assigned recruitment consultant.  1430: Patient is in the hallway, talking with staff and her assigned safety sitter.  1530: Patient is in her room, talking with her assigned safety sitter.  1630: Patient is in her room, talking with her assigned safety sitter.  1730: Patient is in the dayroom, with staff and her assigned safety sitter, having dinner.  1830: Patient remains in the dayroom, playing cards and talking to the other members on the unit, with her assigned safety sitter present.

## 2023-08-02 NOTE — Group Note (Signed)
 Date:  08/02/2023 Time:  9:18 PM  Group Topic/Focus:  Wrap-Up Group:   The focus of this group is to help patients review their daily goal of treatment and discuss progress on daily workbooks.    Participation Level:  Active  Participation Quality:  Appropriate and Sharing  Affect:  Appropriate  Cognitive:  Appropriate  Insight: Appropriate and Good  Engagement in Group:  Engaged and Supportive  Modes of Intervention:  Discussion  Additional Comments:     Kerri Katz 08/02/2023, 9:18 PM

## 2023-08-02 NOTE — Progress Notes (Signed)
 Patient stated that she was having an allergic reaction. Patient reported symptoms of itching and shaking hands. No shaking or tremors noted on exam. Provider notified and instructed ted to administer mild agitation protocol. Medications administered, per MAR. No adverse reactions noted. Patient remains safe at this time.

## 2023-08-02 NOTE — Progress Notes (Signed)
 Patient still on 1:1 while awake, she was active in the group therapy, was pleasant and cooperative, she took her medications with no issue, blood sugar check done.she rated her pain 8/10, PRN ibuprofen  given per. She denies SI/HI/AVH, no aggressive behavior noted, she is resting quietly on her bed, Q 15 safety checks per unit protocol. Will continue to monitor.

## 2023-08-02 NOTE — Group Note (Signed)
 Date:  08/02/2023 Time:  6:21 PM  Group Topic/Focus:  Outdoor recreation structured activity    Participation Level:  Active  Participation Quality:  Appropriate  Affect:  Appropriate  Cognitive:  Appropriate  Insight: Appropriate  Engagement in Group:  Developing/Improving  Modes of Intervention:  Activity and Socialization  Additional Comments:    Lurlean Niece 08/02/2023, 6:21 PM

## 2023-08-02 NOTE — Plan of Care (Signed)
  Problem: Education: Goal: Knowledge of [facility] [place] [facility] Education information/materials will improve Outcome: Progressing Goal: Emotional status will improve Outcome: Progressing Goal: Mental status will improve Outcome: Progressing Goal: Verbalization of understanding the information provided will improve Outcome: Progressing   Problem: Activity: Goal: Interest or engagement in activities will improve Outcome: Progressing Goal: Sleeping patterns will improve Outcome: Progressing   Problem: Coping: Goal: Ability to verbalize frustrations and anger appropriately will improve Outcome: Progressing Goal: Ability to demonstrate self-control will improve Outcome: Progressing   Problem: Health Behavior/Discharge Planning: Goal: Identification of resources available to assist in meeting health care needs will improve Outcome: Progressing Goal: Compliance with treatment plan for underlying cause of condition will improve Outcome: Progressing   Problem: Physical Regulation: Goal: Ability to maintain clinical measurements within normal limits will improve Outcome: Progressing   Problem: Safety: Goal: Periods of time without injury will increase Outcome: Progressing   Problem: Education: Goal: Utilization of techniques to improve thought processes will improve Outcome: Progressing Goal: Knowledge of the prescribed therapeutic regimen will improve Outcome: Progressing   Problem: Activity: Goal: Interest or engagement in leisure activities will improve Outcome: Progressing Goal: Imbalance in normal sleep/wake cycle will improve Outcome: Progressing   Problem: Coping: Goal: Coping ability will improve Outcome: Progressing Goal: Will verbalize feelings Outcome: Progressing   Problem: Health Behavior/Discharge Planning: Goal: Ability to make decisions will improve Outcome: Progressing Goal: Compliance with therapeutic regimen will improve Outcome: Progressing    Problem: Role Relationship: Goal: Will demonstrate positive changes in social behaviors and relationships Outcome: Progressing   Problem: Safety: Goal: Ability to disclose and discuss suicidal ideas will improve Outcome: Progressing Goal: Ability to identify and utilize support systems that promote safety will improve Outcome: Progressing   Problem: Self-Concept: Goal: Will verbalize positive feelings about self Outcome: Progressing Goal: Level of anxiety will decrease Outcome: Progressing   Problem: Education: Goal: Knowledge of General Education information will improve Description: Including pain rating scale, medication(s)/side effects and non-pharmacologic comfort measures Outcome: Progressing   Problem: Health Behavior/Discharge Planning: Goal: Ability to manage health-related needs will improve Outcome: Progressing

## 2023-08-02 NOTE — Group Note (Signed)
 Date:  08/02/2023 Time:  11:08 AM  Group Topic/Focus:  Dimensions of Wellness:   The focus of this group is to introduce the topic of wellness and discuss the role each dimension of wellness plays in total health. Goals Group:   The focus of this group is to help patients establish daily goals to achieve during treatment and discuss how the patient can incorporate goal setting into their daily lives to aide in recovery.    Participation Level:  Did Not Attend   Crystal Black 08/02/2023, 11:08 AM

## 2023-08-03 DIAGNOSIS — F3160 Bipolar disorder, current episode mixed, unspecified: Secondary | ICD-10-CM | POA: Diagnosis not present

## 2023-08-03 LAB — WET PREP, GENITAL
Clue Cells Wet Prep HPF POC: NONE SEEN
Sperm: NONE SEEN
Trich, Wet Prep: NONE SEEN
WBC, Wet Prep HPF POC: >=10 — AB (ref <10)
Yeast Wet Prep HPF POC: NONE SEEN

## 2023-08-03 LAB — GLUCOSE, CAPILLARY
Glucose-Capillary: 155 mg/dL — ABNORMAL HIGH (ref 70–99)
Glucose-Capillary: 267 mg/dL — ABNORMAL HIGH (ref 70–99)
Glucose-Capillary: 113 mg/dL — ABNORMAL HIGH (ref 70–99)
Glucose-Capillary: 125 mg/dL — ABNORMAL HIGH (ref 70–99)

## 2023-08-03 LAB — PREGNANCY, URINE: Preg Test, Ur: NEGATIVE

## 2023-08-03 LAB — CHLAMYDIA/NGC RT PCR (ARMC ONLY)
Chlamydia Tr: NOT DETECTED
N gonorrhoeae: NOT DETECTED

## 2023-08-03 MED ORDER — LIDOCAINE HCL 1 % IJ SOLN
0.0000 mL | Freq: Once | INTRAMUSCULAR | Status: AC | PRN
  Administered 2023-08-03: 6 mL via INTRADERMAL

## 2023-08-03 MED ORDER — ETONOGESTREL 68 MG ~~LOC~~ IMPL
68.0000 mg | DRUG_IMPLANT | Freq: Once | SUBCUTANEOUS | Status: AC
  Administered 2023-08-03: 68 mg via SUBCUTANEOUS
  Filled 2023-08-03 (×2): qty 1

## 2023-08-03 MED ORDER — DIPHENHYDRAMINE HCL 25 MG PO CAPS
50.0000 mg | ORAL_CAPSULE | Freq: Once | ORAL | Status: AC
  Administered 2023-08-03: 50 mg via ORAL
  Filled 2023-08-03: qty 2

## 2023-08-03 NOTE — Progress Notes (Signed)
   08/03/23 1500  Psych Admission Type (Psych Patients Only)  Admission Status Voluntary  Psychosocial Assessment  Patient Complaints Anxiety  Eye Contact Fair  Facial Expression Anxious  Affect Preoccupied  Speech Logical/coherent  Interaction Attention-seeking  Motor Activity Slow;Fidgety  Appearance/Hygiene Unremarkable  Behavior Characteristics Cooperative  Mood Anxious;Pleasant  Thought Process  Coherency WDL  Content Preoccupation  Delusions None reported or observed  Perception WDL  Hallucination None reported or observed  Judgment WDL  Confusion None  Danger to Self  Current suicidal ideation? Denies  Danger to Others  Danger to Others None reported or observed

## 2023-08-03 NOTE — Progress Notes (Signed)
 Pt compliant with medication administration per MD orders. Pt given education, support, and encouragement to be active in her treatment plan. Pt on 1:1 for safety while awake. Pt is asleep at this time and without concern.

## 2023-08-03 NOTE — [facility] Counselor (Signed)
 CSW contacted the patient's guardian with NP Brad.  Call was to review medication changes and need for STD testing.   During conversation it was discovered guardian and NP Brad had discussed the day prior.  Call terminated without incident.  Sherryle Margo, MSW, LCSW 08/03/2023 10:17 AM

## 2023-08-03 NOTE — Group Note (Signed)
 LCSW Group Therapy Note  Group Date: 08/03/2023 Start Time: 1300 End Time: 1400   Type of Therapy and Topic:  Group Therapy: Anger Cues and Responses  Participation Level:  None   Description of Group:   In this group, patients learned how to recognize the physical, cognitive, emotional, and behavioral responses they have to anger-provoking situations.  They identified a recent time they became angry and how they reacted.  They analyzed how their reaction was possibly beneficial and how it was possibly unhelpful.  The group discussed a variety of healthier coping skills that could help with such a situation in the future.  Focus was placed on how helpful it is to recognize the underlying emotions to our anger, because working on those can lead to a more permanent solution as well as our ability to focus on the important rather than the urgent.  Therapeutic Goals: Patients will remember their last incident of anger and how they felt emotionally and physically, what their thoughts were at the time, and how they behaved. Patients will identify how their behavior at that time worked for them, as well as how it worked against them. Patients will explore possible new behaviors to use in future anger situations. Patients will learn that anger itself is normal and cannot be eliminated, and that healthier reactions can assist with resolving conflict rather than worsening situations.  Summary of Patient Progress:   Patient was present in group, however, did not engage in group discussions.    Therapeutic Modalities:   Cognitive Behavioral Therapy    Sherryle JINNY Margo, LCSWA 08/03/2023  2:09 PM

## 2023-08-03 NOTE — Progress Notes (Signed)
 Pt had a mild itchiness/redness on her r arm after eating hummus. Possible Allergy documented. Provider notified and orders given.  Pt informed that possible allergy to hummus documented and to avoid in future.  Pt verbalized understanding.

## 2023-08-03 NOTE — Plan of Care (Signed)
   Problem: Education: Goal: Emotional status will improve Outcome: Progressing
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
## 2023-08-03 NOTE — Plan of Care (Signed)
  Problem: Education: Goal: Knowledge of [facility] [place] [facility] Education information/materials will improve Outcome: Progressing Goal: Emotional status will improve Outcome: Progressing Goal: Mental status will improve Outcome: Progressing Goal: Verbalization of understanding the information provided will improve Outcome: Progressing   Problem: Activity: Goal: Interest or engagement in activities will improve Outcome: Progressing Goal: Sleeping patterns will improve Outcome: Progressing   Problem: Coping: Goal: Ability to verbalize frustrations and anger appropriately will improve Outcome: Progressing Goal: Ability to demonstrate self-control will improve Outcome: Progressing   Problem: Health Behavior/Discharge Planning: Goal: Identification of resources available to assist in meeting health care needs will improve Outcome: Progressing Goal: Compliance with treatment plan for underlying cause of condition will improve Outcome: Progressing   Problem: Physical Regulation: Goal: Ability to maintain clinical measurements within normal limits will improve Outcome: Progressing   Problem: Safety: Goal: Periods of time without injury will increase Outcome: Progressing   Problem: Education: Goal: Utilization of techniques to improve thought processes will improve Outcome: Progressing Goal: Knowledge of the prescribed therapeutic regimen will improve Outcome: Progressing   Problem: Activity: Goal: Interest or engagement in leisure activities will improve Outcome: Progressing Goal: Imbalance in normal sleep/wake cycle will improve Outcome: Progressing   Problem: Coping: Goal: Coping ability will improve Outcome: Progressing Goal: Will verbalize feelings Outcome: Progressing   Problem: Health Behavior/Discharge Planning: Goal: Ability to make decisions will improve Outcome: Progressing Goal: Compliance with therapeutic regimen will improve Outcome: Progressing    Problem: Role Relationship: Goal: Will demonstrate positive changes in social behaviors and relationships Outcome: Progressing   Problem: Safety: Goal: Ability to disclose and discuss suicidal ideas will improve Outcome: Progressing Goal: Ability to identify and utilize support systems that promote safety will improve Outcome: Progressing   Problem: Self-Concept: Goal: Will verbalize positive feelings about self Outcome: Progressing Goal: Level of anxiety will decrease Outcome: Progressing   Problem: Education: Goal: Knowledge of General Education information will improve Description: Including pain rating scale, medication(s)/side effects and non-pharmacologic comfort measures Outcome: Progressing   Problem: Health Behavior/Discharge Planning: Goal: Ability to manage health-related needs will improve Outcome: Progressing

## 2023-08-03 NOTE — Group Note (Signed)
 Date:  08/03/2023 Time:  10:06 PM  Group Topic/Focus:  Personal Choices and Values:   The focus of this group is to help patients assess and explore the importance of values in their lives, how their values affect their decisions, how they express their values and what opposes their expression.    Participation Level:  Active  Participation Quality:  Sharing  Affect:  Appropriate  Cognitive:  Appropriate  Insight: Good  Engagement in Group:  Engaged  Modes of Intervention:  Education  Additional Comments:  Patient presented under the normal limit. Patient was involved in group and able to identify triggers. Patient was attention seeking during group but able to be redirected.   Maxamillion Banas L 08/03/2023, 10:06 PM

## 2023-08-03 NOTE — Group Note (Signed)
 Recreation Therapy Group Note   Group Topic:Self-Esteem  Group Date: 08/03/2023 Start Time: 1000 End Time: 1055 Facilitators: Celestia Jeoffrey BRAVO, LRT, CTRS Location:  Craft Room  Group Description: Positive Affirmation Worksheet. Patients and LRT discussed the importance of self-love/self-esteem and things that cause it to fluctuate, including our mental health. Pts received a large index card and marker. Pts were prompted to write their first name on the card and then pass the card to their peer on the right. Pts were then encouraged to write a positive affirmation or give a compliment to that person. Pt's passed around cards until everyone was able to write one for each person. LRT then collected the index cards and handed them back to the original person. Pts then completed a worksheet that helps them identify 24 different strengths and qualities about themselves. Pt encouraged to read aloud at least 2 off their sheet to the group. LRT and pts discussed how this can be applied to daily life post-discharge.   Goal Area(s) Addressed: Patient will identify positive qualities about themselves. Patient will learn new positive affirmations.  Patient will recite positive qualities and affirmations aloud to the group.  Patient will practice positive self-talk.  Patient will increase communication.   Affect/Mood: N/A   Participation Level: Did not attend    Clinical Observations/Individualized Feedback: Patient did not attend group.   Plan: Continue to engage patient in RT group sessions 2-3x/week.   Jeoffrey BRAVO Celestia, LRT, CTRS 08/03/2023 12:51 PM

## 2023-08-03 NOTE — Procedures (Signed)
 Chief complaint: Nexplanon  Removal and Reinsertion Ms. Walpole is a 28 year old female who is requesting Nexplanon  removal and insertion because her current Nexplanon  is expired (placed 4 years ago). The patient has decided on Nexplanon  as her contraception. The patient's guardian Thena Marsh) was notified of need for Nexplanon  removal/replacement by Brad Moats, NP and gave verbal agreement for Nexplanon  removal/replacement.   Pre-Procedure Details   Urine pregnancy test was done and was negative.  The risks, including altered menstrual bleeding, bleeding, bruising, difficulty locating or removing the device, failure rate, infection, injury to nerve or muscle, redness at the site and swelling and benefits of the procedure were explained to the patient and written informed consent was obtained.  Procedure Note    Prior to the procedure being performed, the patient was asked to state patient's full name, date of birth, type of procedure being performed and the exact location of the operative site. This information was then checked against the documentation in the patient's chart. Prior to the procedure being performed, a time out was performed by the provider that confirmed the correct patient, procedure and site.  Nexplanon  was palpated in the patient's left arm. Betadine prep was used; Local anesthesia with 1% lidocaine  3 ml was used.  3 mm incision was made in the skin at the distal end of the device was made.  The implant was guided to the incision; The capsule was entered and the device was removed intact with a hemostat.  Removal was visually verified by the patient. Steristrips and pressure dressing was applied to the removal site.   The patient's non-dominant left arm was chosen for re-insertion. A site was marked approximately 8 cm proximal to the medial epicondyle per the manufacturers instructions. The previous site removal location was not used due to updated nexplanon  instructions.  The  area was cleaned with alcohol pad then local anesthesia was infiltrated with 3 ml of 1% lidocaine  along the planned insertion track. Using sterile technique the Nexplanon  device was inserted, per manufacturer's guidelines in the subdermal connective tissue using the standard insertion technique, without difficulty. Pressure was applied and the insertion site was hemostatic. The presence of the Nexplanon  was confirmed immediately after insertion by palpation by both me and the patient and by checking the tip of needle for the absence of the insert. Steristrips and pressure dressing were applied.  All sharps were placed in the sharps box that was present in the Behavioral Health exam room. All supplies were removed from the room.   Thersia Buerger, RN was present and assisting with Nexplanon  insertion.   EBL: minimal, <5cc  Nexplanon  lot number: [id number] Exp date: 07/2025  Complications: None  Plan: The patient was advised to notify behavioral health Rns and techs for any fever or for prolonged or severe pain or bleeding. She was advised to use OTC analgesic as needed for mild to moderate pain.   I personally performed the service, non-incident to. [facility])  Edsel Charlies Blush, CNM 08/03/2023 8:11 PM

## 2023-08-03 NOTE — Progress Notes (Signed)
 Patient on 1:1 for safety with a sitter present. Pt is calm and cooperative. Pt denies SI/HI/AVH. Pt is without concern at this time. Pt remains safe on the unit
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
## 2023-08-03 NOTE — Group Note (Signed)
 Date:  08/03/2023 Time:  10:22 AM  Group Topic/Focus:   Goals Group:   The focus of this group is to help patients establish daily goals to achieve during treatment and discuss how the patient can incorporate goal setting into their daily lives to aide in recovery  Overcoming Stress:   The focus of this group is to define stress and help patients assess their triggers.   Participation Level:  Did Not Attend   Ramaj Frangos A Mareta Chesnut 08/03/2023, 10:22 AM

## 2023-08-03 NOTE — BH IP Treatment Plan (Signed)
 Interdisciplinary Treatment and Diagnostic Plan Update  08/03/2023 Time of Session: 8:30AM Crystal Black MRN: [id number]  Principal Diagnosis: Bipolar I disorder, most recent episode mixed (HCC)  Secondary Diagnoses: Principal Problem:   Bipolar I disorder, most recent episode mixed (HCC) Active Problems:   PTSD (post-traumatic stress disorder)   HIV disease (HCC)   Thrombosed hemorrhoids   Abdominal pain   Current Medications:  Current Facility-Administered Medications  Medication Dose Route Frequency Provider Last Rate Last Admin   albuterol  (VENTOLIN  HFA) 108 (90 Base) MCG/ACT inhaler 2 puff  2 puff Inhalation Q4H PRN Davis, Nina S, NP   2 puff at 07/29/23 1019   alum & mag hydroxide-simeth (MAALOX/MYLANTA) 200-200-20 MG/5ML suspension 30 mL  30 mL Oral Q4H PRN McLauchlin, Jon, NP   30 mL at 08/02/23 2036   darunavir -cobicistat  (PREZCOBIX ) 800-150 MG per tablet 1 tablet  1 tablet Oral Q breakfast Nicholaus Brad RAMAN, NP   1 tablet at 08/02/23 1051   haloperidol  (HALDOL ) tablet 5 mg  5 mg Oral TID PRN McLauchlin, [name], NP       And   diphenhydrAMINE  (BENADRYL ) capsule 50 mg  50 mg Oral TID PRN McLauchlin, [name], NP       haloperidol  (HALDOL ) tablet 5 mg  5 mg Oral TID PRN Lee, Jacqueline Eun, NP   5 mg at 08/02/23 1500   And   diphenhydrAMINE  (BENADRYL ) capsule 50 mg  50 mg Oral TID PRN Lee, Jacqueline Eun, NP   50 mg at 08/02/23 1500   haloperidol  lactate (HALDOL ) injection 5 mg  5 mg Intramuscular TID PRN McLauchlin, Jon, NP       And   diphenhydrAMINE  (BENADRYL ) injection 50 mg  50 mg Intramuscular TID PRN McLauchlin, Jon, NP       And   LORazepam  (ATIVAN ) injection 2 mg  2 mg Intramuscular TID PRN McLauchlin, Jon, NP       haloperidol  lactate (HALDOL ) injection 10 mg  10 mg Intramuscular TID PRN McLauchlin, Jon, NP       And   diphenhydrAMINE  (BENADRYL ) injection 50 mg  50 mg Intramuscular TID PRN McLauchlin, Jon, NP       And   LORazepam  (ATIVAN ) injection 2  mg  2 mg Intramuscular TID PRN McLauchlin, Jon, NP       haloperidol  lactate (HALDOL ) injection 5 mg  5 mg Intramuscular TID PRN Lee, Jacqueline Eun, NP       And   diphenhydrAMINE  (BENADRYL ) injection 50 mg  50 mg Intramuscular TID PRN Lee, Jacqueline Eun, NP       And   LORazepam  (ATIVAN ) injection 2 mg  2 mg Intramuscular TID PRN Lee, Jacqueline Eun, NP       haloperidol  lactate (HALDOL ) injection 10 mg  10 mg Intramuscular TID PRN Lee, Jacqueline Eun, NP   10 mg at 07/25/23 1836   And   diphenhydrAMINE  (BENADRYL ) injection 50 mg  50 mg Intramuscular TID PRN Lee, Jacqueline Eun, NP   50 mg at 07/25/23 1839   And   LORazepam  (ATIVAN ) injection 2 mg  2 mg Intramuscular TID PRN Lee, Jacqueline Eun, NP   2 mg at 07/25/23 1840   divalproex  (DEPAKOTE  ER) 24 hr tablet 500 mg  500 mg Oral BID Nicholaus Brad RAMAN, NP   500 mg at 08/02/23 1649   docusate sodium  (COLACE) capsule 200 mg  200 mg Oral Daily [name], [name], NP   200 mg at 08/02/23 1052   dolutegravir  (TIVICAY ) tablet 50 mg  50 mg Oral Daily Nicholaus Brad RAMAN, NP   50 mg at 08/02/23 1051   famotidine  (PEPCID ) tablet 20 mg  20 mg Oral Daily Davis, Nina S, NP   20 mg at 08/02/23 1052   feeding supplement (GLUCERNA SHAKE) (GLUCERNA SHAKE) liquid 237 mL  237 mL Oral TID BM Nicholaus Brad RAMAN, NP   237 mL at 08/02/23 1053   FLUoxetine  (PROZAC ) capsule 20 mg  20 mg Oral Daily Davis, Nina S, NP       hydrocortisone  (ANUSOL -HC) 2.5 % rectal cream   Rectal BID Tobie Mario GAILS, MD   Given at 07/25/23 1710   hydrOXYzine  (ATARAX ) tablet 50 mg  50 mg Oral Q6H PRN Nicholaus Brad RAMAN, NP   50 mg at 08/01/23 1553   ibuprofen  (ADVIL ) tablet 600 mg  600 mg Oral Q8H PRN Nicholaus Brad RAMAN, NP   600 mg at 08/02/23 2127   influenza vac split trivalent PF (FLULAVAL) injection 0.5 mL  0.5 mL Intramuscular Tomorrow-1000 Victoria Ruts, MD       linaclotide  (LINZESS ) capsule 290 mcg  290 mcg Oral QAC breakfast Nicholaus Brad RAMAN, NP   290 mcg at 08/02/23 1051   magnesium  hydroxide (MILK  OF MAGNESIA) suspension 30 mL  30 mL Oral Daily PRN McLauchlin, Jon, NP   30 mL at 07/31/23 2123   melatonin tablet 5 mg  5 mg Oral QHS Davis, Nina S, NP   5 mg at 08/02/23 2129   metFORMIN  (GLUCOPHAGE ) tablet 500 mg  500 mg Oral Q breakfast [name], [name], NP   500 mg at 08/02/23 1053   methocarbamol  (ROBAXIN ) tablet 500 mg  500 mg Oral QHS Nicholaus Brad RAMAN, NP   500 mg at 08/02/23 2040   montelukast  (SINGULAIR ) tablet 5 mg  5 mg Oral QHS [name], [name], DO   5 mg at 08/02/23 2127   nicotine  polacrilex (NICORETTE ) gum 2 mg  2 mg Oral PRN Nicholaus Brad RAMAN, NP   2 mg at 08/02/23 1746   ondansetron  (ZOFRAN ) tablet 4 mg  4 mg Oral Q8H PRN Davis, Nina S, NP   4 mg at 07/31/23 2156   polyethylene glycol (MIRALAX  / GLYCOLAX ) packet 17 g  17 g Oral Daily [name], [name], NP   17 g at 08/01/23 1447   prazosin  (MINIPRESS ) capsule 5 mg  5 mg Oral QHS [name], [name], NP   5 mg at 08/02/23 2040   traZODone  (DESYREL ) tablet 50 mg  50 mg Oral QHS PRN McLauchlin, [name], NP   50 mg at 08/02/23 2040   PTA Medications: Medications Prior to Admission  Medication Sig Dispense Refill Last Dose/Taking   albuterol  (VENTOLIN  HFA) 108 (90 Base) MCG/ACT inhaler Inhale 2 puffs into the lungs every 4 (four) hours as needed for wheezing or shortness of breath.   Taking As Needed   ARIPiprazole  (ABILIFY ) 2 MG tablet Take 1 tablet (2 mg total) by mouth daily. 12 tablet 0 07/17/2023 at  8:00 AM   [START ON 08/06/2023] ARIPiprazole  ER (ABILIFY  MAINTENA) 400 MG SRER injection Inject 1.5 mLs (300 mg total) into the muscle every 28 (twenty-eight) days. 1.5 mL 0 06/30/2023   benztropine  (COGENTIN ) 1 MG tablet Take 1 tablet (1 mg total) by mouth 2 (two) times daily. 60 tablet 0 07/17/2023 at  8:00 PM   darunavir -cobicistat  (PREZCOBIX ) 800-150 MG tablet Take 1 tablet by mouth daily with breakfast. Swallow whole. Do NOT crush, break or chew tablets. Take with food. 30 tablet 1  07/17/2023 at  8:00 AM   divalproex  (DEPAKOTE  ER) 250 MG  24 hr tablet Take 1 tablet (250 mg total) by mouth 2 (two) times daily. 60 tablet 0 07/17/2023 at  8:00 PM   docusate sodium  (COLACE) 100 MG capsule Take 300 mg by mouth daily.   07/17/2023 at  8:00 AM   dolutegravir  (TIVICAY ) 50 MG tablet Take 1 tablet (50 mg total) by mouth daily. 30 tablet 1 07/17/2023 at  8:00 AM   EPINEPHrine  0.3 mg/0.3 mL IJ SOAJ injection Inject 0.3 mg into the muscle as needed for anaphylaxis.   Taking As Needed   famotidine  (PEPCID ) 20 MG tablet Take 20 mg by mouth 2 (two) times daily.   Taking   FIBER ADULT GUMMIES PO Take 1 tablet by mouth in the morning and at bedtime.   07/17/2023 at  8:00 PM   FLUoxetine  (PROZAC ) 40 MG capsule Take 40 mg by mouth at bedtime.   07/17/2023 at  8:00 PM   glycerin adult 2 g suppository Place 1 suppository rectally as needed for constipation.   Taking As Needed   hydrOXYzine  (ATARAX ) 25 MG tablet Take 1 tablet (25 mg total) by mouth 3 (three) times daily as needed. 60 tablet 1 Taking As Needed   ibuprofen  (ADVIL ) 800 MG tablet Take 1 tablet (800 mg total) by mouth every 8 (eight) hours as needed for moderate pain. 15 tablet 0 Taking As Needed   ketoconazole (NIZORAL) 2 % shampoo Apply 1 Application topically. Monday/ Wednesday/ Friday   07/17/2023 at  3:00 PM   linaclotide  (LINZESS ) 290 MCG CAPS capsule Take 290 mcg by mouth daily before breakfast.   07/17/2023 at  7:00 AM   magnesium  citrate SOLN Take 1 Bottle by mouth daily as needed for severe constipation.   Taking As Needed   melatonin 5 MG TABS Take 0.5 tablets (2.5 mg total) by mouth at bedtime. 15 tablet 0 07/17/2023 at  8:00 PM   methocarbamol  (ROBAXIN ) 500 MG tablet Take 500 mg by mouth at bedtime.   Taking   montelukast  (SINGULAIR ) 10 MG tablet Take 10 mg by mouth daily.   07/17/2023 at  8:00 AM   nicotine  (NICODERM CQ  - DOSED IN MG/24 HR) 7 mg/24hr patch Place 1 patch (7 mg total) onto the skin daily. 360 patch 0 07/17/2023 at  8:00 AM   nicotine  polacrilex (NICORETTE ) 2 MG gum Take 2 mg  by mouth as needed for smoking cessation.   Taking As Needed   nicotine  polacrilex (NICOTINE  MINI) 4 MG lozenge Take 1 lozenge (4 mg total) by mouth as needed. 100 tablet 0 Taking As Needed   ondansetron  (ZOFRAN -ODT) 4 MG disintegrating tablet Take 1 tablet (4 mg total) by mouth every 8 (eight) hours as needed for nausea or vomiting. 20 tablet 0 Taking As Needed   prazosin  (MINIPRESS ) 5 MG capsule Take 1 capsule (5 mg total) by mouth at bedtime. 30 capsule 1 07/17/2023 at  8:00 PM   risperiDONE  (RISPERDAL ) 0.5 MG tablet Take 0.5 mg by mouth 2 (two) times daily. 0800/1600   07/17/2023 at  4:00 PM   topiramate  (TOPAMAX ) 50 MG tablet Take 1 tablet (50 mg total) by mouth 2 (two) times daily. (Patient taking differently: Take 50 mg by mouth at bedtime.) 60 tablet 1 07/17/2023 at  8:00 PM   traZODone  (DESYREL ) 50 MG tablet Take 50 mg by mouth at bedtime.   07/17/2023 at  8:00 PM   etonogestrel  (NEXPLANON ) 68 MG IMPL implant 1  each (68 mg total) by Subdermal route once for 1 dose. 1 each 0     Patient Stressors:    Patient Strengths:    Treatment Modalities: Medication Management, Group therapy, Case management,  1 to 1 session with clinician, Psychoeducation, Recreational therapy.   Physician Treatment Plan for Primary Diagnosis: Bipolar I disorder, most recent episode mixed (HCC) Long Term Goal(s): Improvement in symptoms so as ready for discharge   Short Term Goals: Ability to identify changes in lifestyle to reduce recurrence of condition will improve Ability to verbalize feelings will improve Ability to disclose and discuss suicidal ideas Ability to demonstrate self-control will improve Ability to identify and develop effective coping behaviors will improve Ability to maintain clinical measurements within normal limits will improve Compliance with prescribed medications will improve Ability to identify triggers associated with substance abuse/mental health issues will improve  Medication  Management: Evaluate patient's response, side effects, and tolerance of medication regimen.  Therapeutic Interventions: 1 to 1 sessions, Unit Group sessions and Medication administration.  Evaluation of Outcomes: Progressing  Physician Treatment Plan for Secondary Diagnosis: Principal Problem:   Bipolar I disorder, most recent episode mixed (HCC) Active Problems:   PTSD (post-traumatic stress disorder)   HIV disease (HCC)   Thrombosed hemorrhoids   Abdominal pain  Long Term Goal(s): Improvement in symptoms so as ready for discharge   Short Term Goals: Ability to identify changes in lifestyle to reduce recurrence of condition will improve Ability to verbalize feelings will improve Ability to disclose and discuss suicidal ideas Ability to demonstrate self-control will improve Ability to identify and develop effective coping behaviors will improve Ability to maintain clinical measurements within normal limits will improve Compliance with prescribed medications will improve Ability to identify triggers associated with substance abuse/mental health issues will improve     Medication Management: Evaluate patient's response, side effects, and tolerance of medication regimen.  Therapeutic Interventions: 1 to 1 sessions, Unit Group sessions and Medication administration.  Evaluation of Outcomes: Progressing   RN Treatment Plan for Primary Diagnosis: Bipolar I disorder, most recent episode mixed (HCC) Long Term Goal(s): Knowledge of disease and therapeutic regimen to maintain health will improve  Short Term Goals: Ability to demonstrate self-control, Ability to participate in decision making will improve, Ability to verbalize feelings will improve, Ability to disclose and discuss suicidal ideas, Ability to identify and develop effective coping behaviors will improve, and Compliance with prescribed medications will improve  Medication Management: RN will administer medications as ordered by  provider, will assess and evaluate patient's response and provide education to patient for prescribed medication. RN will report any adverse and/or side effects to prescribing provider.  Therapeutic Interventions: 1 on 1 counseling sessions, Psychoeducation, Medication administration, Evaluate responses to treatment, Monitor vital signs and CBGs as ordered, Perform/monitor CIWA, COWS, AIMS and Fall Risk screenings as ordered, Perform wound care treatments as ordered.  Evaluation of Outcomes: Progressing   LCSW Treatment Plan for Primary Diagnosis: Bipolar I disorder, most recent episode mixed (HCC) Long Term Goal(s): Safe transition to appropriate next level of care at discharge, Engage patient in therapeutic group addressing interpersonal concerns.  Short Term Goals: Engage patient in aftercare planning with referrals and resources, Increase social support, Increase ability to appropriately verbalize feelings, Increase emotional regulation, Facilitate acceptance of mental health diagnosis and concerns, and Increase skills for wellness and recovery  Therapeutic Interventions: Assess for all discharge needs, 1 to 1 time with Social worker, Explore available resources and support systems, Assess for adequacy in community  support network, Educate family and significant other(s) on suicide prevention, Complete Psychosocial Assessment, Interpersonal group therapy.  Evaluation of Outcomes: Progressing   Progress in Treatment: Attending groups: Yes. Participating in groups: Yes. Taking medication as prescribed: Yes. Toleration medication: Yes. Family/Significant other contact made: Yes, individual(s) contacted:  SPE completed with the patient's guardian. Patient understands diagnosis: Yes. Discussing patient identified problems/goals with staff: Yes. Medical problems stabilized or resolved: Yes. Denies suicidal/homicidal ideation: Yes. Issues/concerns per patient self-inventory: Yes. Other:  none  New problem(s) identified: No, Describe:  none identified. Update 08/03/2023:  No changes at this time.    New Short Term/Long Term Goal(s): medication management for mood stabilization; elimination of SI thoughts; development of comprehensive mental wellness plan. Update 07/24/23: No changes at this time. Update: 07/29/2023: No changes at this time.  Update 08/03/2023:  No changes at this time.    Patient Goals:  Pt declined to participate in treatment team meeting. Update 07/24/23: No changes at this time.  Update 08/03/2023:  No changes at this time.    Discharge Plan or Barriers: CSW will assist pt/guardian with development of an appropriate aftercare/discharge plan. Update 07/24/23: Guardian notified team that pt will not be returning to group home. Update: 07/29/2023: No changes at this time.  Update 08/03/2023:  Patient's guardian continues to look for placement for the patient.  Patient remains safe on the unit.    Reason for Continuation of Hospitalization: Depression Medication stabilization Suicidal ideation   Estimated Length of Stay: 1-7 days Update 07/24/23: No changes at this time. Update: 07/29/2023: No changes at this time  Update 08/03/2023:  TBD  Last 3 [place] Suicide Severity Risk Score: Flowsheet Row Admission (Current) from 07/18/2023 in [facility]AL [facility]ICINE ED from 07/11/2023 in [facility] Emergency Department at [facility] Admission (Discharged) from 07/05/2023 in [facility] INPATIENT BEHAVIORAL MEDICINE  C-SSRS RISK CATEGORY High Risk Error: Q3, 4, or 5 should not be populated when Q2 is No High Risk       Last PHQ 2/9 Scores:    05/04/2023    9:21 AM 02/16/2023    9:49 AM 11/17/2022   10:53 AM  Depression screen PHQ 2/9  Decreased Interest 0 0 0  Down, Depressed, Hopeless 0 0 1  PHQ - 2 Score 0 0 1    Scribe for Treatment Team: Sherryle JINNY Margo, LCSW 08/03/2023 10:12 AM

## 2023-08-04 DIAGNOSIS — F3160 Bipolar disorder, current episode mixed, unspecified: Secondary | ICD-10-CM | POA: Diagnosis not present

## 2023-08-04 LAB — GLUCOSE, CAPILLARY
Glucose-Capillary: 243 mg/dL — ABNORMAL HIGH (ref 70–99)
Glucose-Capillary: 111 mg/dL — ABNORMAL HIGH (ref 70–99)
Glucose-Capillary: 147 mg/dL — ABNORMAL HIGH (ref 70–99)

## 2023-08-04 LAB — HIV-1/HIV-2 QUAL RNA
HIV-1 RNA, Qualitative: NONREACTIVE
HIV-2 RNA, Qualitative: NONREACTIVE

## 2023-08-04 MED ORDER — ARIPIPRAZOLE ER 400 MG IM SRER
300.0000 mg | INTRAMUSCULAR | Status: DC
  Filled 2023-08-04: qty 2
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
## 2023-08-04 NOTE — [facility] Counselor (Signed)
 CSW contacted [facility] for possible bed.  Mr Theotis Flake declined patient at this time due to age.  Shasta Deist, MSW, LCSW 08/04/2023 1:05 PM

## 2023-08-04 NOTE — Progress Notes (Signed)
   08/04/23 1500  Psych Admission Type (Psych Patients Only)  Admission Status Voluntary  Psychosocial Assessment  Patient Complaints Agitation  Eye Contact Poor  Facial Expression Anxious  Affect Preoccupied  Speech Logical/coherent  Interaction Attention-seeking  Motor Activity Slow  Appearance/Hygiene Unremarkable  Behavior Characteristics Cooperative  Mood Anxious  Aggressive Behavior  Effect No apparent injury  Thought Process  Coherency Disorganized  Content Blaming others;Ambivalence  Delusions None reported or observed  Perception WDL  Hallucination None reported or observed  Judgment Poor  Confusion Mild  Danger to Self  Current suicidal ideation? Denies (Denies)  Agreement Not to Harm Self Yes  Description of Agreement Verbal  Danger to Others  Danger to Others None reported or observed

## 2023-08-04 NOTE — Plan of Care (Signed)
   Problem: Education: Goal: Emotional status will improve Outcome: Not Progressing Goal: Mental status will improve Outcome: Not Progressing

## 2023-08-04 NOTE — Group Note (Signed)
 Date:  08/03/2023 Time:  3:30 pm  Group Topic/Focus:  Outdoor Recreation    Participation Level:  Minimal  Participation Quality:  Appropriate  Affect:  Appropriate  Cognitive:  Appropriate  Insight: Appropriate  Engagement in Group:  Engaged  Modes of Intervention:  Activity  Additional Comments:    Deitra Clap Mariene Dickerman 08/04/2023, 10:17 AM

## 2023-08-04 NOTE — [place] FL2 (Signed)
 [place]  MEDICAID [id number] LEVEL OF CARE FORM     IDENTIFICATION  Patient Name: Crystal Black Birthdate: 04/14/1996 Sex: female Admission Date (Current Location): 07/18/2023  [facility] and [place] Number:  Chiropodist and Address:  [facility], [address] [place], [place] [address]      Provider Number: [id number]  Attending Physician Name and Address:  Victoria Ruts, MD  Relative Name and Phone Number:       Current Level of Care: Hospital Recommended Level of Care: Family Care Home, Other (Comment) (Group Home) Prior Approval Number:    Date Approved/Denied:   PASRR Number:    Discharge Plan: Other (Comment) [facility], Group Home)    Current Diagnoses: Patient Active Problem List   Diagnosis Date Noted   Thrombosed hemorrhoids 07/18/2023   Abdominal pain 07/18/2023   Sore throat 07/10/2023   Bipolar I disorder, most recent episode mixed (HCC) 07/07/2023   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   Suicidal ideation 06/07/2022   HIV disease (HCC) 02/21/2019   Polysubstance abuse (HCC) 04/18/2018   Borderline personality disorder (HCC) 07/18/2016   PTSD (post-traumatic stress disorder) 11/21/2013    Orientation RESPIRATION BLADDER Height & Weight     Self, Time, Situation, Place  Normal Continent Weight: 191 lb (86.6 kg) Height:  5' 3 (160 cm)  BEHAVIORAL SYMPTOMS/MOOD NEUROLOGICAL BOWEL NUTRITION STATUS   (NA)  (NA) Continent Diet (Regular)  AMBULATORY STATUS COMMUNICATION OF NEEDS Skin   Independent Verbally Normal                       Personal Care Assistance Level of Assistance   (NA)           Functional Limitations Info   (NA)          SPECIAL CARE FACTORS FREQUENCY   (Blood sugars before each meal, Metformin  to address blood sugar)                    Contractures Contractures Info: Not present    Additional Factors Info  Code Status, Allergies, Psychotropic  Code Status Info: Full Allergies Info: Fish Allergy, Peanut-containing Drug Products, Hummus, Peanut Oil Psychotropic Info: Depakote  ER, Prozac          Current Medications (08/04/2023):  This is the current hospital active medication list Current Facility-Administered Medications  Medication Dose Route Frequency Provider Last Rate Last Admin   albuterol  (VENTOLIN  HFA) 108 (90 Base) MCG/ACT inhaler 2 puff  2 puff Inhalation Q4H PRN Davis, Nina S, NP   2 puff at 07/29/23 1019   alum & mag hydroxide-simeth (MAALOX/MYLANTA) 200-200-20 MG/5ML suspension 30 mL  30 mL Oral Q4H PRN McLauchlin, Jon, NP   30 mL at 08/02/23 2036   darunavir -cobicistat  (PREZCOBIX ) 800-150 MG per tablet 1 tablet  1 tablet Oral Q breakfast Nicholaus Brad RAMAN, NP   1 tablet at 08/04/23 1022   haloperidol  (HALDOL ) tablet 5 mg  5 mg Oral TID PRN McLauchlin, [name], NP       And   diphenhydrAMINE  (BENADRYL ) capsule 50 mg  50 mg Oral TID PRN McLauchlin, [name], NP       haloperidol  (HALDOL ) tablet 5 mg  5 mg Oral TID PRN Lee, Jacqueline Eun, NP   5 mg at 08/02/23 1500   And   diphenhydrAMINE  (BENADRYL ) capsule 50 mg  50 mg Oral TID PRN Lee, Jacqueline Eun, NP   50 mg at 08/02/23 1500   haloperidol   lactate (HALDOL ) injection 5 mg  5 mg Intramuscular TID PRN McLauchlin, Jon, NP       And   diphenhydrAMINE  (BENADRYL ) injection 50 mg  50 mg Intramuscular TID PRN McLauchlin, Jon, NP       And   LORazepam  (ATIVAN ) injection 2 mg  2 mg Intramuscular TID PRN McLauchlin, [name], NP       haloperidol  lactate (HALDOL ) injection 10 mg  10 mg Intramuscular TID PRN McLauchlin, Jon, NP       And   diphenhydrAMINE  (BENADRYL ) injection 50 mg  50 mg Intramuscular TID PRN McLauchlin, Jon, NP       And   LORazepam  (ATIVAN ) injection 2 mg  2 mg Intramuscular TID PRN McLauchlin, [name], NP       haloperidol  lactate (HALDOL ) injection 5 mg  5 mg Intramuscular TID PRN Lee, Jacqueline Eun, NP       And   diphenhydrAMINE  (BENADRYL )  injection 50 mg  50 mg Intramuscular TID PRN Lee, Jacqueline Eun, NP       And   LORazepam  (ATIVAN ) injection 2 mg  2 mg Intramuscular TID PRN Lee, Jacqueline Eun, NP       haloperidol  lactate (HALDOL ) injection 10 mg  10 mg Intramuscular TID PRN Lee, Jacqueline Eun, NP   10 mg at 07/25/23 1836   And   diphenhydrAMINE  (BENADRYL ) injection 50 mg  50 mg Intramuscular TID PRN Lee, Jacqueline Eun, NP   50 mg at 07/25/23 1839   And   LORazepam  (ATIVAN ) injection 2 mg  2 mg Intramuscular TID PRN Lee, Jacqueline Eun, NP   2 mg at 07/25/23 1840   divalproex  (DEPAKOTE  ER) 24 hr tablet 500 mg  500 mg Oral BID Nicholaus Brad RAMAN, NP   500 mg at 08/04/23 1020   docusate sodium  (COLACE) capsule 200 mg  200 mg Oral Daily [name], [name], NP   200 mg at 08/04/23 1018   dolutegravir  (TIVICAY ) tablet 50 mg  50 mg Oral Daily Davis, Nina S, NP   50 mg at 08/04/23 1019   famotidine  (PEPCID ) tablet 20 mg  20 mg Oral Daily Davis, Nina S, NP   20 mg at 08/04/23 1020   feeding supplement (GLUCERNA SHAKE) (GLUCERNA SHAKE) liquid 237 mL  237 mL Oral TID BM Nicholaus Brad RAMAN, NP   237 mL at 08/04/23 1034   FLUoxetine  (PROZAC ) capsule 20 mg  20 mg Oral Daily Nicholaus Brad RAMAN, NP   20 mg at 08/04/23 1020   hydrocortisone  (ANUSOL -HC) 2.5 % rectal cream   Rectal BID Tobie Mario GAILS, MD   1 Application at 08/04/23 1022   hydrOXYzine  (ATARAX ) tablet 50 mg  50 mg Oral Q6H PRN Davis, Nina S, NP   50 mg at 08/01/23 1553   ibuprofen  (ADVIL ) tablet 600 mg  600 mg Oral Q8H PRN Davis, Nina S, NP   600 mg at 08/04/23 1020   influenza vac split trivalent PF (FLULAVAL) injection 0.5 mL  0.5 mL Intramuscular Tomorrow-1000 Victoria Ruts, MD       linaclotide  (LINZESS ) capsule 290 mcg  290 mcg Oral QAC breakfast Nicholaus Brad RAMAN, NP   290 mcg at 08/04/23 1019   magnesium  hydroxide (MILK OF MAGNESIA) suspension 30 mL  30 mL Oral Daily PRN McLauchlin, Jon, NP   30 mL at 07/31/23 2123   melatonin tablet 5 mg  5 mg Oral QHS Davis, Nina S, NP   5 mg at  08/03/23 2127   metFORMIN  (GLUCOPHAGE ) tablet  500 mg  500 mg Oral Q breakfast [name], [name], NP   500 mg at 08/04/23 1020   methocarbamol  (ROBAXIN ) tablet 500 mg  500 mg Oral QHS Nicholaus Brad RAMAN, NP   500 mg at 08/03/23 2127   montelukast  (SINGULAIR ) tablet 5 mg  5 mg Oral QHS [name], [name], DO   5 mg at 08/03/23 2127   nicotine  polacrilex (NICORETTE ) gum 2 mg  2 mg Oral PRN Davis, Nina S, NP   2 mg at 08/03/23 1718   ondansetron  (ZOFRAN ) tablet 4 mg  4 mg Oral Q8H PRN Davis, Nina S, NP   4 mg at 08/03/23 1316   polyethylene glycol (MIRALAX  / GLYCOLAX ) packet 17 g  17 g Oral Daily [name], [name], NP   17 g at 08/01/23 1447   prazosin  (MINIPRESS ) capsule 5 mg  5 mg Oral QHS [name], [name], NP   5 mg at 08/03/23 2127   traZODone  (DESYREL ) tablet 50 mg  50 mg Oral QHS PRN McLauchlin, [name], NP   50 mg at 08/03/23 2127     Discharge Medications: Please see discharge summary for a list of discharge medications.  Relevant Imaging Results:  Relevant Lab Results:   Additional Information Sherlynn Kaleta Marsh, [phone]  Sherryle JINNY Margo, LCSW

## 2023-08-04 NOTE — Group Note (Signed)
 Recreation Therapy Group Note   Group Topic:Leisure Education  Group Date: 08/04/2023 Start Time: 1000 End Time: 1100 Facilitators: Celestia Jeoffrey BRAVO, LRT, CTRS Location:  Craft Room  Group Description: Leisure. Patients were given the option to choose from singing karaoke, coloring mandalas, using oil pastels, journaling, or playing with play-doh. LRT and pts discussed the meaning of leisure, the importance of participating in leisure during their free time/when they're outside of the hospital, as well as how our leisure interests can also serve as coping skills.   Goal Area(s) Addressed:  Patient will identify a current leisure interest.  Patient will learn the definition of "leisure". Patient will practice making a positive decision. Patient will have the opportunity to try a new leisure activity. Patient will communicate with peers and LRT.    Affect/Mood: N/A   Participation Level: Did not attend    Clinical Observations/Individualized Feedback: Patient did not attend group.   Plan: Continue to engage patient in RT group sessions 2-3x/week.   Jeoffrey BRAVO Celestia, LRT, CTRS 08/04/2023 12:15 PM

## 2023-08-04 NOTE — Group Note (Signed)
 Date:  08/04/2023 Time:  10:28 AM  Group Topic/Focus:  Goals Group:   The focus of this group is to help patients establish daily goals to achieve during treatment and discuss how the patient can incorporate goal setting into their daily lives to aide in recovery.    Participation Level:  Did Not Attend   Deitra Clap [facility] 08/04/2023, 10:28 AM

## 2023-08-04 NOTE — Group Note (Signed)
 Date:  08/04/2023 Time:  4:46 PM  Group Topic/Focus:  Outdoor Recreation    Participation Level:  Active  Participation Quality:  Appropriate  Affect:  Appropriate  Cognitive:  Appropriate  Insight: Appropriate  Engagement in Group:  Engaged  Modes of Intervention:  Activity  Additional Comments:    Crystal Black [place] 08/04/2023, 4:46 PM

## 2023-08-04 NOTE — Plan of Care (Signed)
  Problem: Education: Goal: Knowledge of [facility] [place] [facility] Education information/materials will improve Outcome: Progressing Goal: Emotional status will improve Outcome: Progressing Goal: Mental status will improve Outcome: Progressing Goal: Verbalization of understanding the information provided will improve Outcome: Progressing   Problem: Activity: Goal: Interest or engagement in activities will improve Outcome: Progressing Goal: Sleeping patterns will improve Outcome: Progressing   Problem: Coping: Goal: Ability to verbalize frustrations and anger appropriately will improve Outcome: Progressing Goal: Ability to demonstrate self-control will improve Outcome: Progressing   Problem: Health Behavior/Discharge Planning: Goal: Identification of resources available to assist in meeting health care needs will improve Outcome: Progressing Goal: Compliance with treatment plan for underlying cause of condition will improve Outcome: Progressing   Problem: Physical Regulation: Goal: Ability to maintain clinical measurements within normal limits will improve Outcome: Progressing   Problem: Safety: Goal: Periods of time without injury will increase Outcome: Progressing   Problem: Education: Goal: Utilization of techniques to improve thought processes will improve Outcome: Progressing Goal: Knowledge of the prescribed therapeutic regimen will improve Outcome: Progressing   Problem: Activity: Goal: Interest or engagement in leisure activities will improve Outcome: Progressing Goal: Imbalance in normal sleep/wake cycle will improve Outcome: Progressing   Problem: Coping: Goal: Coping ability will improve Outcome: Progressing Goal: Will verbalize feelings Outcome: Progressing   Problem: Health Behavior/Discharge Planning: Goal: Ability to make decisions will improve Outcome: Progressing Goal: Compliance with therapeutic regimen will improve Outcome: Progressing    Problem: Role Relationship: Goal: Will demonstrate positive changes in social behaviors and relationships Outcome: Progressing   Problem: Safety: Goal: Ability to disclose and discuss suicidal ideas will improve Outcome: Progressing Goal: Ability to identify and utilize support systems that promote safety will improve Outcome: Progressing   Problem: Self-Concept: Goal: Will verbalize positive feelings about self Outcome: Progressing Goal: Level of anxiety will decrease Outcome: Progressing   Problem: Education: Goal: Knowledge of General Education information will improve Description: Including pain rating scale, medication(s)/side effects and non-pharmacologic comfort measures Outcome: Progressing   Problem: Health Behavior/Discharge Planning: Goal: Ability to manage health-related needs will improve Outcome: Progressing

## 2023-08-04 NOTE — Progress Notes (Addendum)
 The patient continued to express frustration and agitation, verbally refusing staff intervention. When approached, the patient loudly stated, Leave me the hell alone and Get out of my room. I don't want to talk to you. Staff informed the patient that she could not remain in her room unsupervised. In response, the patient stormed out of her room and proceeded to the day room, continuing to yell in the hallway, asserting that she did not need staff monitoring. At this time, another staff member intervened and escorted the patient to their office, where they will continue to sit with her to ensure safety and prevent further escalation.  Patient appeared highly agitated, engaged in loud, hostile verbalizations directed toward staff and others in the environment. Threatening statements were made, heightening concern for safety. Despite repeated requests to come indoors from a group outside, patient remained uncooperative and agitated. Staff support team was called to assist in managing the situation. At approximately 1410, B52 injection (Benadryl , Haldol , Ativan ) was administered to the patient intramuscularly in the right deltoid. Patient voluntarily agreed to receive the medication when the support team arrived. Patient was initially resistant to staff requests and continued to be verbally aggressive but complied with receiving the injection upon arrival of the support team.   1500: patient appears calm and back with sitter as ordered. Received patient's wristband from social worker stating that the patient gave it to her and that she had used the wrist band to scratch herself. Patient's assigned nurse was closeby and received the wristband and information from child psychotherapist.

## 2023-08-04 NOTE — B[facility] Counselor (Signed)
 CSW met with the patient to check in as patient was escalated earlier.   Patient reports that she was triggered by a death of someone personal.    She apologized for her behaviors and CSW provided empathetic support.   Patient reports that she was told that I'm being followed because I had sex on the unit.  CSW explained that pt's behaviors were a concern and processed were altered to best offer and provide the patient with the best support. CSW explained that leadership has made some of these changes.  Patient expressed surprise, she thought MHT's made the choice and didn't know that executive leadership is making the choices.   Patient expressed understanding.   Patient pulled CSW to the side and showed her left forearm. Pt reports that she took her name wristband and scratched her forearm with it.  CSW removed the wristband from the patient, patient had already taken it off and was holding it in the palm of her hand.  CSW made charge nurse [facility] aware.   CSW to make NP Brad aware.   Sherryle Margo, MSW, LCSW 08/04/2023 2:57 PM

## 2023-08-04 NOTE — Group Note (Signed)
 Date:  08/04/2023 Time:  8:57 PM  Group Topic/Focus:  Managing Feelings:   The focus of this group is to identify what feelings patients have difficulty handling and develop a plan to handle them in a healthier way upon discharge.    Participation Level:  Minimal  Participation Quality:  Appropriate  Affect:  Flat  Cognitive:  Appropriate  Insight: Lacking  Engagement in Group:  Engaged  Modes of Intervention:  Education  Additional Comments:  Patient presented flat and withdrawn. Participation in group was minimal.  Houa Nie L 08/04/2023, 8:57 PM

## 2023-08-05 DIAGNOSIS — F3160 Bipolar disorder, current episode mixed, unspecified: Secondary | ICD-10-CM | POA: Diagnosis not present
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
## 2023-08-05 NOTE — Plan of Care (Signed)
  Problem: Education: Goal: Knowledge of [facility] [place] [facility] Education information/materials will improve Outcome: Progressing Goal: Emotional status will improve Outcome: Progressing Goal: Mental status will improve Outcome: Progressing Goal: Verbalization of understanding the information provided will improve Outcome: Progressing   Problem: Activity: Goal: Interest or engagement in activities will improve Outcome: Progressing Goal: Sleeping patterns will improve Outcome: Progressing   Problem: Coping: Goal: Ability to verbalize frustrations and anger appropriately will improve Outcome: Progressing Goal: Ability to demonstrate self-control will improve Outcome: Progressing   Problem: Health Behavior/Discharge Planning: Goal: Identification of resources available to assist in meeting health care needs will improve Outcome: Progressing Goal: Compliance with treatment plan for underlying cause of condition will improve Outcome: Progressing   Problem: Physical Regulation: Goal: Ability to maintain clinical measurements within normal limits will improve Outcome: Progressing   Problem: Safety: Goal: Periods of time without injury will increase Outcome: Progressing   Problem: Education: Goal: Utilization of techniques to improve thought processes will improve Outcome: Progressing Goal: Knowledge of the prescribed therapeutic regimen will improve Outcome: Progressing   Problem: Activity: Goal: Interest or engagement in leisure activities will improve Outcome: Progressing Goal: Imbalance in normal sleep/wake cycle will improve Outcome: Progressing   Problem: Coping: Goal: Coping ability will improve Outcome: Progressing Goal: Will verbalize feelings Outcome: Progressing   Problem: Health Behavior/Discharge Planning: Goal: Ability to make decisions will improve Outcome: Progressing Goal: Compliance with therapeutic regimen will improve Outcome: Progressing    Problem: Role Relationship: Goal: Will demonstrate positive changes in social behaviors and relationships Outcome: Progressing   Problem: Safety: Goal: Ability to disclose and discuss suicidal ideas will improve Outcome: Progressing Goal: Ability to identify and utilize support systems that promote safety will improve Outcome: Progressing   Problem: Self-Concept: Goal: Will verbalize positive feelings about self Outcome: Progressing Goal: Level of anxiety will decrease Outcome: Progressing   Problem: Education: Goal: Knowledge of General Education information will improve Description: Including pain rating scale, medication(s)/side effects and non-pharmacologic comfort measures Outcome: Progressing   Problem: Health Behavior/Discharge Planning: Goal: Ability to manage health-related needs will improve Outcome: Progressing

## 2023-08-05 NOTE — Progress Notes (Signed)
   08/05/23 2100  Psych Admission Type (Psych Patients Only)  Admission Status Voluntary  Psychosocial Assessment  Patient Complaints None  Eye Contact Fair  Facial Expression Animated  Affect Preoccupied  Speech Logical/coherent  Interaction Attention-seeking;Childlike  Motor Activity Slow  Appearance/Hygiene Improved  Behavior Characteristics Cooperative;Appropriate to situation;Calm  Mood Pleasant  Thought Process  Coherency Disorganized  Content Preoccupation  Delusions None reported or observed  Perception WDL  Hallucination None reported or observed  Judgment Limited  Confusion Mild  Danger to Self  Current suicidal ideation? Denies   Patient alert and oriented x 4, affect is flat thoughts are organized she denies SI/HI/AVH no bizarre behavior noted, affect is congruent with mood. Patient continues to be  on 1 : 1 while awake no distress noted will continue to monitor.

## 2023-08-05 NOTE — Group Note (Signed)
 Date:  08/05/2023 Time:  2:29 PM  Group Topic/Focus:  Healthy Communication:   The focus of this group is to discuss communication, barriers to communication, as well as healthy ways to communicate with others.    Participation Level:  Did Not Attend   Crystal Black 08/05/2023, 2:29 PM

## 2023-08-05 NOTE — Group Note (Signed)
 Date:  08/05/2023 Time:  2:33 PM  Group Topic/Focus:  Activity Group: The focus of this group is to promote activity for the patients and encourage them to go outside to the courtyard for some fresh air and some exercise.    Participation Level:  Did Not Attend   Crystal Black 08/05/2023, 2:33 PM

## 2023-08-05 NOTE — Plan of Care (Signed)
  Problem: Activity: Goal: Sleeping patterns will improve Outcome: Progressing   

## 2023-08-05 NOTE — Progress Notes (Signed)
 Pt is on 1:1 while awake for safety. Pt observed interacting appropriately with staff and peers on the unit. Pt compliant with medication administration per MD orders. Pt given education, support, and encouragement to be active in her treatment plan. Pt being monitored Q 15 minutes for safety per unit protocol, remains safe on the unit

## 2023-08-05 NOTE — Progress Notes (Signed)
   08/05/23 1800  Psych Admission Type (Psych Patients Only)  Admission Status Voluntary  Psychosocial Assessment  Patient Complaints None  Eye Contact Fair  Facial Expression Anxious  Affect Preoccupied  Speech Logical/coherent  Interaction Attention-seeking  Motor Activity Slow  Appearance/Hygiene Unremarkable  Behavior Characteristics Cooperative  Mood Anxious  Thought Process  Coherency Disorganized  Content Preoccupation  Delusions None reported or observed  Perception WDL  Hallucination None reported or observed  Judgment Poor  Confusion Mild  Danger to Self  Current suicidal ideation? Denies  Danger to Others  Danger to Others None reported or observed

## 2023-08-06 DIAGNOSIS — F3160 Bipolar disorder, current episode mixed, unspecified: Secondary | ICD-10-CM | POA: Diagnosis not present

## 2023-08-06 MED ORDER — ARIPIPRAZOLE ER 400 MG IM SRER
300.0000 mg | INTRAMUSCULAR | Status: DC
  Administered 2023-08-07 – 2023-09-04 (×2): 300 mg via INTRAMUSCULAR
  Filled 2023-08-06 (×2): qty 2

## 2023-08-06 NOTE — Progress Notes (Signed)
   08/06/23 2200  Psych Admission Type (Psych Patients Only)  Admission Status Voluntary  Psychosocial Assessment  Patient Complaints None  Eye Contact Fair  Facial Expression Animated  Affect Preoccupied  Speech Logical/coherent  Interaction Attention-seeking  Motor Activity Slow  Appearance/Hygiene Improved  Behavior Characteristics Cooperative  Mood Pleasant;Preoccupied  Thought Process  Coherency Concrete thinking  Content Preoccupation  Delusions None reported or observed  Perception WDL  Hallucination None reported or observed  Judgment Limited  Confusion Mild  Danger to Self  Current suicidal ideation? Denies  Danger to Others  Danger to Others None reported or observed

## 2023-08-06 NOTE — Group Note (Signed)
 Date:  08/06/2023 Time:  11:02 PM  Group Topic/Focus:  Wrap-Up Group:   The focus of this group is to help patients review their daily goal of treatment and discuss progress on daily workbooks.    Participation Level:  Active  Participation Quality:  Appropriate  Affect:  Appropriate  Cognitive:  Appropriate  Insight: Appropriate  Engagement in Group:  Engaged  Modes of Intervention:  Discussion  Maglione,Corry Storie E 08/06/2023, 11:02 PM

## 2023-08-06 NOTE — Group Note (Signed)
 Date:  08/06/2023 Time:  9:06 PM  Group Topic/Focus:  Wellness Toolbox:   The focus of this group is to discuss various aspects of wellness, balancing those aspects and exploring ways to increase the ability to experience wellness.  Patients will create a wellness toolbox for use upon discharge. Wrap-Up Group:   The focus of this group is to help patients review their daily goal of treatment and discuss progress on daily workbooks.    Participation Level:  Minimal  Participation Quality:  Appropriate and Attentive  Affect:  Appropriate  Cognitive:  Alert  Insight: Appropriate  Engagement in Group:  Engaged  Modes of Intervention:  Discussion  Additional Comments:     Crystal Black,Crystal Black 08/06/2023, 9:06 PM
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
## 2023-08-06 NOTE — Plan of Care (Signed)
  Problem: Education: Goal: Mental status will improve Outcome: Progressing   Problem: Education: Goal: Emotional status will improve Outcome: Progressing

## 2023-08-06 NOTE — Plan of Care (Signed)
  Problem: Education: Goal: Verbalization of understanding the information provided will improve Outcome: Progressing   Problem: Education: Goal: Mental status will improve Outcome: Progressing   Problem: Education: Goal: Emotional status will improve Outcome: Progressing
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
## 2023-08-06 NOTE — Group Note (Signed)
 Date:  08/06/2023 Time:  5:41 PM  Group Topic/Focus:  Outdoor recreation  therapy  structured activity    Participation Level:  Active  Participation Quality:  Appropriate  Affect:  Appropriate  Cognitive:  Alert and Appropriate  Insight: Appropriate  Engagement in Group:  Developing/Improving and Engaged  Modes of Intervention:  Activity, Discussion, and Socialization  Additional Comments:    Lurlean Niece 08/06/2023, 5:41 PM

## 2023-08-06 NOTE — Group Note (Signed)
 Date:  08/06/2023 Time:  10:14 AM  Group Topic/Focus:  Goals Group:   The focus of this group is to help patients establish daily goals to achieve during treatment and discuss how the patient can incorporate goal setting into their daily lives to aide in recovery. Healthy Communication:   The focus of this group is to discuss communication, barriers to communication, as well as healthy ways to communicate with others. Making Healthy Choices:   The focus of this group is to help patients identify negative/unhealthy choices they were using prior to admission and identify positive/healthier coping strategies to replace them upon discharge.    Participation Level:  Active  Participation Quality:  Appropriate  Affect:  Appropriate  Cognitive:  Appropriate  Insight: Appropriate  Engagement in Group:  Developing/Improving  Modes of Intervention:  Activity, Discussion, and Education  Additional Comments:    Crystal Black 08/06/2023, 10:14 AM

## 2023-08-07 DIAGNOSIS — F3160 Bipolar disorder, current episode mixed, unspecified: Secondary | ICD-10-CM | POA: Diagnosis not present

## 2023-08-07 LAB — GLUCOSE, CAPILLARY
Glucose-Capillary: 166 mg/dL — ABNORMAL HIGH (ref 70–99)
Glucose-Capillary: 102 mg/dL — ABNORMAL HIGH (ref 70–99)
Glucose-Capillary: 204 mg/dL — ABNORMAL HIGH (ref 70–99)
Glucose-Capillary: 238 mg/dL — ABNORMAL HIGH (ref 70–99)
Glucose-Capillary: 205 mg/dL — ABNORMAL HIGH (ref 70–99)
Glucose-Capillary: 229 mg/dL — ABNORMAL HIGH (ref 70–99)
Glucose-Capillary: 164 mg/dL — ABNORMAL HIGH (ref 70–99)
Glucose-Capillary: 134 mg/dL — ABNORMAL HIGH (ref 70–99)

## 2023-08-07 NOTE — Group Note (Signed)
 Date:  08/07/2023 Time:  9:59 AM  Group Topic/Focus:  Goals Group:   The focus of this group is to help patients establish daily goals to achieve during treatment and discuss how the patient can incorporate goal setting into their daily lives to aide in recovery. Wellness Toolbox:   The focus of this group is to discuss various aspects of wellness, balancing those aspects and exploring ways to increase the ability to experience wellness.  Patients will create a wellness toolbox for use upon discharge.    Participation Level:  Did Not Attend   Crystal Black 08/07/2023, 9:59 AM

## 2023-08-07 NOTE — Group Note (Signed)
 Recreation Therapy Group Note   Group Topic:Relaxation  Group Date: 08/07/2023 Start Time: 1530 End Time: 1610 Facilitators: Yvonna Herder, CTRS Location:  Craft Room  Group Description: Meditation. LRT and patients discussed what they know about meditation and mindfulness. LRT played a Deep Breathing Meditation exercise script for patients to follow along to. LRT and patients discussed how meditation and deep breathing can be used as a coping skill post--discharge to help manage symptoms of stress.   Goal Area(s) Addressed: Patient will practice using relaxation technique. Patient will identify a new coping skill.  Patient will follow multistep directions to reduce anxiety and stress.   Affect/Mood: Appropriate   Participation Level: Active   Participation Quality: Independent   Behavior: Appropriate   Speech/Thought Process: Coherent   Insight: Good   Judgement: Good   Modes of Intervention: Activity   Patient Response to Interventions:  Attentive and Receptive   Education Outcome:  Acknowledges education   Clinical Observations/Individualized Feedback: Crystal Black was active in their participation of session activities and group discussion. Pt identified "smoothing" as how the prompt made her feel. Pt interacted well with LRT and peers duration of session.   Plan: Continue to engage patient in RT group sessions 2-3x/week.   [address], LRT, CTRS 08/07/2023 5:02 PM

## 2023-08-07 NOTE — Plan of Care (Signed)
  Problem: Education: Goal: Knowledge of [facility] [place] [facility] Education information/materials will improve Outcome: Progressing Goal: Emotional status will improve Outcome: Progressing Goal: Mental status will improve Outcome: Progressing Goal: Verbalization of understanding the information provided will improve Outcome: Progressing   Problem: Activity: Goal: Interest or engagement in activities will improve Outcome: Progressing Goal: Sleeping patterns will improve Outcome: Progressing   Problem: Coping: Goal: Ability to verbalize frustrations and anger appropriately will improve Outcome: Progressing Goal: Ability to demonstrate self-control will improve Outcome: Progressing   Problem: Health Behavior/Discharge Planning: Goal: Identification of resources available to assist in meeting health care needs will improve Outcome: Progressing Goal: Compliance with treatment plan for underlying cause of condition will improve Outcome: Progressing   Problem: Physical Regulation: Goal: Ability to maintain clinical measurements within normal limits will improve Outcome: Progressing   Problem: Safety: Goal: Periods of time without injury will increase Outcome: Progressing   Problem: Education: Goal: Utilization of techniques to improve thought processes will improve Outcome: Progressing Goal: Knowledge of the prescribed therapeutic regimen will improve Outcome: Progressing   Problem: Activity: Goal: Interest or engagement in leisure activities will improve Outcome: Progressing Goal: Imbalance in normal sleep/wake cycle will improve Outcome: Progressing   Problem: Coping: Goal: Coping ability will improve Outcome: Progressing Goal: Will verbalize feelings Outcome: Progressing   Problem: Health Behavior/Discharge Planning: Goal: Ability to make decisions will improve Outcome: Progressing Goal: Compliance with therapeutic regimen will improve Outcome: Progressing    Problem: Role Relationship: Goal: Will demonstrate positive changes in social behaviors and relationships Outcome: Progressing   Problem: Safety: Goal: Ability to disclose and discuss suicidal ideas will improve Outcome: Progressing Goal: Ability to identify and utilize support systems that promote safety will improve Outcome: Progressing   Problem: Self-Concept: Goal: Will verbalize positive feelings about self Outcome: Progressing Goal: Level of anxiety will decrease Outcome: Progressing   Problem: Education: Goal: Knowledge of General Education information will improve Description: Including pain rating scale, medication(s)/side effects and non-pharmacologic comfort measures Outcome: Progressing   Problem: Health Behavior/Discharge Planning: Goal: Ability to manage health-related needs will improve Outcome: Progressing

## 2023-08-07 NOTE — Progress Notes (Signed)
   08/07/23 1000  Psych Admission Type (Psych Patients Only)  Admission Status Voluntary  Psychosocial Assessment  Patient Complaints None  Eye Contact Fair  Facial Expression Other (Comment) (WNL)  Affect Preoccupied  Speech Logical/coherent  Interaction Assertive  Motor Activity Slow  Appearance/Hygiene Improved  Behavior Characteristics Cooperative  Mood Pleasant  Aggressive Behavior  Effect No apparent injury  Thought Process  Coherency WDL  Content Preoccupation  Delusions None reported or observed  Perception WDL  Hallucination None reported or observed  Judgment Limited  Confusion None  Danger to Self  Current suicidal ideation? Denies  Self-Injurious Behavior No self-injurious ideation or behavior indicators observed or expressed   Agreement Not to Harm Self Yes  Description of Agreement verbal  Danger to Others  Danger to Others None reported or observed

## 2023-08-07 NOTE — Progress Notes (Signed)
 Care of the patient taken over at 11:45pm . The patient was resting in the bed and seemed to rest well through out the night.

## 2023-08-07 NOTE — Plan of Care (Signed)
  Problem: Education: Goal: Emotional status will improve Outcome: Progressing   Problem: Activity: Goal: Interest or engagement in activities will improve Outcome: Progressing Goal: Sleeping patterns will improve Outcome: Progressing   Problem: Coping: Goal: Ability to verbalize frustrations and anger appropriately will improve Outcome: Progressing   Problem: Education: Goal: Mental status will improve Outcome: Not Progressing

## 2023-08-07 NOTE — Group Note (Signed)
 Date:  08/07/2023 Time:  10:31 PM  Group Topic/Focus:  Orientation:   The focus of this group is to educate the patient on the purpose and policies of crisis stabilization and provide a format to answer questions about their admission.  The group details unit policies and expectations of patients while admitted.    Participation Level:  Minimal  Participation Quality:  Inattentive  Affect:  Irritable  Cognitive:  Lacking  Insight: Limited  Engagement in Group:  Limited and Poor  Modes of Intervention:  Support  Additional Comments:     Lavinia Mcneely 08/07/2023, 10:31 PM

## 2023-08-07 NOTE — Group Note (Signed)
 [facility] LCSW Group Therapy Note    Group Date: 08/07/2023 Start Time: 1300 End Time: 1345  Type of Therapy and Topic:  Group Therapy:  Overcoming Obstacles  Participation Level:  [facility] PARTICIPATION LEVEL: Did Not Attend  Description of Group:   In this group patients will be encouraged to explore what they see as obstacles to their own wellness and recovery. They will be guided to discuss their thoughts, feelings, and behaviors related to these obstacles. The group will process together ways to cope with barriers, with attention given to specific choices patients can make. Each patient will be challenged to identify changes they are motivated to make in order to overcome their obstacles. This group will be process-oriented, with patients participating in exploration of their own experiences as well as giving and receiving support and challenge from other group members.  Therapeutic Goals: 1. Patient will identify personal and current obstacles as they relate to admission. 2. Patient will identify barriers that currently interfere with their wellness or overcoming obstacles.  3. Patient will identify feelings, thought process and behaviors related to these barriers. 4. Patient will identify two changes they are willing to make to overcome these obstacles:    Summary of Patient Progress X   Therapeutic Modalities:   Cognitive Behavioral Therapy Solution Focused Therapy Motivational Interviewing Relapse Prevention Therapy   Randolm Butte, LCSW

## 2023-08-07 NOTE — Group Note (Signed)
 Recreation Therapy Group Note   Group Topic:Coping Skills  Group Date: 08/07/2023 Start Time: 1000 End Time: 1100 Facilitators: Deatrice Factor, LRT, CTRS Location:  Craft Room  Group Description: Mind Map.  Patient was provided a blank template of a diagram with 32 blank boxes in a tiered system, branching from the center (similar to a bubble chart). LRT directed patients to label the middle of the diagram "Coping Skills". LRT and patients then came up with 8 different coping skills as examples. Pt were directed to record their coping skills in the 2nd tier boxes closest to the center.  Patients would then share their coping skills with the group as LRT wrote them out. LRT gave a handout of 99 different coping skills at the end of group.   Goal Area(s) Addressed: Patients will be able to define "coping skills". Patient will identify new coping skills.  Patient will increase communication.   Affect/Mood: N/A   Participation Level: Did not attend    Clinical Observations/Individualized Feedback: Patient did not attend group.   Plan: Continue to engage patient in RT group sessions 2-3x/week.   Deatrice Factor, LRT, CTRS 08/07/2023 11:45 AM

## 2023-08-07 NOTE — Progress Notes (Addendum)
 0800 pt in room sleeping 0900 pt in room sleeping 1000 pt in room sleeping 1100 pt in room sleeping 1200 pt in room sleeping 1300 pt in dayroom with 1:1 safety sitter 1400 pt in dayroom with 1:1 safety sitter 1500 pt in med room with 1:1 safety sitter 1600 pt in craft room with 1:1 safety sitter 1700 pt in dayroom with 1:1 safety sitter 1800 pt at nurse station c/o agitation with 1:1 safety sitter 1900 pt in room with 1:1 sitter

## 2023-08-08 DIAGNOSIS — F3160 Bipolar disorder, current episode mixed, unspecified: Secondary | ICD-10-CM | POA: Diagnosis not present

## 2023-08-08 LAB — GLUCOSE, CAPILLARY
Glucose-Capillary: 243 mg/dL — ABNORMAL HIGH (ref 70–99)
Glucose-Capillary: 239 mg/dL — ABNORMAL HIGH (ref 70–99)
Glucose-Capillary: 122 mg/dL — ABNORMAL HIGH (ref 70–99)
Glucose-Capillary: 178 mg/dL — ABNORMAL HIGH (ref 70–99)

## 2023-08-08 NOTE — Progress Notes (Signed)
   08/08/23 1400  Spiritual Encounters  Type of Visit Initial  Care provided to: Patient  Conversation partners present during encounter Nurse;Social worker/Care management/TOC  Referral source Patient request  Reason for visit Routine spiritual support  OnCall Visit No   Chaplain provided spiritual resources, empathy, compassionate presence and prayer.  Rev. Rana M. Earlene Plater, MDiv.  Chaplain Resident [facility]

## 2023-08-08 NOTE — Plan of Care (Signed)
Problem: Education: Goal: Emotional status will improve Outcome: Progressing Goal: Mental status will improve Outcome: Progressing

## 2023-08-08 NOTE — Group Note (Signed)
LCSW Group Therapy Note   Group Date: 08/08/2023 Start Time: 1300 End Time: 1407   Type of Therapy and Topic:  Group Therapy: Challenging Core Beliefs  Participation Level:  Did Not Attend  Description of Group:  Patients were educated about core beliefs and asked to identify one harmful core belief that they have. Patients were asked to explore from where those beliefs originate. Patients were asked to discuss how those beliefs make them feel and the resulting behaviors of those beliefs. They were then be asked if those beliefs are true and, if so, what evidence they have to support them. Lastly, group members were challenged to replace those negative core beliefs with helpful beliefs.   Therapeutic Goals:   1. Patient will identify harmful core beliefs and explore the origins of such beliefs. 2. Patient will identify feelings and behaviors that result from those core beliefs. 3. Patient will discuss whether such beliefs are true. 4.  Patient will replace harmful core beliefs with helpful ones.  Summary of Patient Progress:  Patient did not attend group.   Therapeutic Modalities: Cognitive Behavioral Therapy; Solution-Focused Therapy   Lowry Ram, LCSWA 08/08/2023  2:08 PM

## 2023-08-08 NOTE — Group Note (Signed)
Recreation Therapy Group Note   Group Topic:Goal Setting  Group Date: 08/08/2023 Start Time: 1000 End Time: 1100 Facilitators: Rosina Lowenstein, LRT, CTRS Location:  Craft Room  Group Description: Product/process development scientist. Patients were given many different magazines, a glue stick, markers, and a piece of cardstock paper. LRT and pts discussed the importance of having goals in life. LRT and pts discussed the difference between short-term and long-term goals, as well as what a SMART goal is. LRT encouraged pts to create a vision board, with images they picked and then cut out with safety scissors from the magazine, for themselves, that capture their short and long-term goals. LRT encouraged pts to show and explain their vision board to the group.   Goal Area(s) Addressed:  Patient will gain knowledge of short vs. long term goals.  Patient will identify goals for themselves. Patient will practice setting SMART goals. Patient will verbalize their goals to LRT and peers.   Affect/Mood: N/A   Participation Level: Did not attend    Clinical Observations/Individualized Feedback: Patient did not attend group.   Plan: Continue to engage patient in RT group sessions 2-3x/week.   Rosina Lowenstein, LRT, CTRS 08/08/2023 11:48 AM
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
## 2023-08-08 NOTE — Group Note (Signed)
Recreation Therapy Group Note   Group Topic:Coping Skills  Group Date: 08/08/2023 Start Time: 1530 End Time: 1615 Facilitators: Rosina Lowenstein, LRT, CTRS Location:  Dayroom  Group Description: Coping A-Z. LRT and patients engage in a guided discussion on what coping skills are and gave specific examples. LRT passed out a handout labeled Coping A-Z with blank spaces beside each letter. LRT prompted patients to come up with a coping skill for each of the letters. LRT and patients went over the handout and gave ideas for each letter if anyone had any blanks left on their paper. Patients kept this handout with them that listed 26 different coping skills.   Goal Area(s) Addressed: Patients will be able to define "coping skills". Patient will identify new coping skills.  Patient will increase communication.   Affect/Mood: N/A   Participation Level: Did not attend    Clinical Observations/Individualized Feedback: Patient did not attend group.   Plan: Continue to engage patient in RT group sessions 2-3x/week.   [address] LRT, CTRS 08/08/2023 5:25 PM

## 2023-08-08 NOTE — Group Note (Signed)
Date:  08/08/2023 Time:  9:04 PM  Group Topic/Focus:  Wrap-Up Group:   The focus of this group is to help patients review their daily goal of treatment and discuss progress on daily workbooks.    Participation Level:  Minimal  Participation Quality:  Appropriate  Affect:  Appropriate  Cognitive:  Appropriate  Insight: Limited  Engagement in Group:  Limited  Modes of Intervention:  Discussion  Additional Comments:     Crystal Black 08/08/2023, 9:04 PM

## 2023-08-08 NOTE — Progress Notes (Signed)
1:1 Patient Monitoring (while awake)  0800: Patient is in the dayroom, eating breakfast with staff present.  1130: Patient is in the dayroom, waiting for lunch, with staff and the other members on the unit.  1230: Patient is in her room, talking to her assigned safety sitter.  1330: Patient is in her room, talking to her assigned safety sitter  1430: Patient is in her room, speaking with the Chaplain, per her request, with her assigned safety sitter present.  1630: Patient is in the dayroom, eating dinner with other members on the unit, along with her assigned safety sitter present.  1730: Patient is in her bathroom, with her assigned safety sitter present.

## 2023-08-08 NOTE — Progress Notes (Signed)
Patient is asleep. This writer will administer scheduled medication once he wakes up. NP was notified and is ok with this.

## 2023-08-08 NOTE — BH IP Treatment Plan (Signed)
Interdisciplinary Treatment and Diagnostic Plan Update  08/08/2023 Time of Session: 8:30AM Crystal Black MRN: [id number]  Principal Diagnosis: Bipolar I disorder, most recent episode mixed (HCC)  Secondary Diagnoses: Principal Problem:   Bipolar I disorder, most recent episode mixed (HCC) Active Problems:   PTSD (post-traumatic stress disorder)   HIV disease (HCC)   Thrombosed hemorrhoids   Abdominal pain   Current Medications:  Current Facility-Administered Medications  Medication Dose Route Frequency Provider Last Rate Last Admin   albuterol (VENTOLIN HFA) 108 (90 Base) MCG/ACT inhaler 2 puff  2 puff Inhalation Q4H PRN [name], NP   2 puff at 08/05/23 2136   alum & mag hydroxide-simeth (MAALOX/MYLANTA) 200-200-20 MG/5ML suspension 30 mL  30 mL Oral Q4H PRN McLauchlin, [name], NP   30 mL at 08/02/23 2036   ARIPiprazole ER (ABILIFY MAINTENA) injection 300 mg  300 mg Intramuscular Q28 days [name], NP   300 mg at 08/07/23 1009   darunavir-cobicistat (PREZCOBIX) 800-150 MG per tablet 1 tablet  1 tablet Oral Q breakfast [name], NP   1 tablet at 08/07/23 4098   haloperidol (HALDOL) tablet 5 mg  5 mg Oral TID PRN McLauchlin, [name], NP       And   diphenhydrAMINE (BENADRYL) capsule 50 mg  50 mg Oral TID PRN McLauchlin, [name], NP       haloperidol (HALDOL) tablet 5 mg  5 mg Oral TID PRN [name], NP   5 mg at 08/07/23 1805   And   diphenhydrAMINE (BENADRYL) capsule 50 mg  50 mg Oral TID PRN [name], NP   50 mg at 08/07/23 1805   haloperidol lactate (HALDOL) injection 5 mg  5 mg Intramuscular TID PRN McLauchlin, [name], NP       And   diphenhydrAMINE (BENADRYL) injection 50 mg  50 mg Intramuscular TID PRN McLauchlin, [name], NP   50 mg at 08/04/23 1412   And   LORazepam (ATIVAN) injection 2 mg  2 mg Intramuscular TID PRN McLauchlin, [name], NP   2 mg at 08/04/23 1412   haloperidol lactate (HALDOL) injection 10 mg  10 mg Intramuscular TID PRN  McLauchlin, [name], NP       And   diphenhydrAMINE (BENADRYL) injection 50 mg  50 mg Intramuscular TID PRN McLauchlin, [name], NP       And   LORazepam (ATIVAN) injection 2 mg  2 mg Intramuscular TID PRN McLauchlin, [name], NP       haloperidol lactate (HALDOL) injection 5 mg  5 mg Intramuscular TID PRN [name], NP       And   diphenhydrAMINE (BENADRYL) injection 50 mg  50 mg Intramuscular TID PRN [name], NP       And   LORazepam (ATIVAN) injection 2 mg  2 mg Intramuscular TID PRN [name], NP       haloperidol lactate (HALDOL) injection 10 mg  10 mg Intramuscular TID PRN [name], NP   10 mg at 08/04/23 1411   And   diphenhydrAMINE (BENADRYL) injection 50 mg  50 mg Intramuscular TID PRN [name], NP   50 mg at 07/25/23 1839   And   LORazepam (ATIVAN) injection 2 mg  2 mg Intramuscular TID PRN [name], NP   2 mg at 07/25/23 1840   divalproex (DEPAKOTE ER) 24 hr tablet 500 mg  500 mg Oral BID [name], NP   500 mg at 08/07/23  1607   docusate sodium (COLACE) capsule 200 mg  200 mg Oral Daily [name], [name], NP   200 mg at 08/07/23 0949   dolutegravir (TIVICAY) tablet 50 mg  50 mg Oral Daily [name], NP   50 mg at 08/07/23 0951   famotidine (PEPCID) tablet 20 mg  20 mg Oral Daily [name], NP   20 mg at 08/07/23 0949   feeding supplement (GLUCERNA SHAKE) (GLUCERNA SHAKE) liquid 237 mL  237 mL Oral TID BM [name], NP   237 mL at 08/07/23 2131   FLUoxetine (PROZAC) capsule 20 mg  20 mg Oral Daily [name], NP   20 mg at 08/07/23 7829   hydrocortisone (ANUSOL-HC) 2.5 % rectal cream   Rectal BID [name], MD   1 Application at 08/04/23 1022   hydrOXYzine (ATARAX) tablet 50 mg  50 mg Oral Q6H PRN [name], NP   50 mg at 08/07/23 2018   ibuprofen (ADVIL) tablet 600 mg  600 mg Oral Q8H PRN [name], NP   600 mg at 08/05/23 1652   influenza vac split trivalent PF (FLULAVAL) injection 0.5  mL  0.5 mL Intramuscular Tomorrow-1000 [name], MD       linaclotide [name]) capsule 290 mcg  290 mcg Oral QAC breakfast [name], NP   290 mcg at 08/07/23 5621   magnesium hydroxide (MILK OF MAGNESIA) suspension 30 mL  30 mL Oral Daily PRN McLauchlin, [name], NP   30 mL at 08/07/23 2138   melatonin tablet 5 mg  5 mg Oral QHS [name], NP   5 mg at 08/07/23 2128   metFORMIN (GLUCOPHAGE) tablet 500 mg  500 mg Oral Q breakfast Melanie Crazier, [name], NP   500 mg at 08/07/23 3086   methocarbamol (ROBAXIN) tablet 500 mg  500 mg Oral QHS [name], NP   500 mg at 08/07/23 2131   montelukast (SINGULAIR) tablet 5 mg  5 mg Oral QHS [name], [name], DO   5 mg at 08/07/23 2129   nicotine polacrilex (NICORETTE) gum 2 mg  2 mg Oral PRN [name], NP   2 mg at 08/07/23 2129   ondansetron (ZOFRAN) tablet 4 mg  4 mg Oral Q8H PRN [name], NP   4 mg at 08/06/23 1333   polyethylene glycol (MIRALAX / GLYCOLAX) packet 17 g  17 g Oral Daily [name], [name], NP   17 g at 08/07/23 1501   prazosin (MINIPRESS) capsule 5 mg  5 mg Oral QHS [name], [name], NP   5 mg at 08/07/23 2131   traZODone (DESYREL) tablet 50 mg  50 mg Oral QHS PRN McLauchlin, [name], NP   50 mg at 08/07/23 2128   PTA Medications: Medications Prior to Admission  Medication Sig Dispense Refill Last Dose/Taking   albuterol (VENTOLIN HFA) 108 (90 Base) MCG/ACT inhaler Inhale 2 puffs into the lungs every 4 (four) hours as needed for wheezing or shortness of breath.   Taking As Needed   ARIPiprazole (ABILIFY) 2 MG tablet Take 1 tablet (2 mg total) by mouth daily. 12 tablet 0 07/17/2023 at  8:00 AM   ARIPiprazole ER (ABILIFY MAINTENA) 400 MG SRER injection Inject 1.5 mLs (300 mg total) into the muscle every 28 (twenty-eight) days. 1.5 mL 0 06/30/2023   benztropine (COGENTIN) 1 MG tablet Take 1 tablet (1 mg total) by mouth 2 (two) times daily. 60 tablet 0 07/17/2023  at  8:00 PM   darunavir-cobicistat (PREZCOBIX) 800-150 MG  tablet Take 1 tablet by mouth daily with breakfast. Swallow whole. Do NOT crush, break or chew tablets. Take with food. 30 tablet 1 07/17/2023 at  8:00 AM   divalproex (DEPAKOTE ER) 250 MG 24 hr tablet Take 1 tablet (250 mg total) by mouth 2 (two) times daily. 60 tablet 0 07/17/2023 at  8:00 PM   docusate sodium (COLACE) 100 MG capsule Take 300 mg by mouth daily.   07/17/2023 at  8:00 AM   dolutegravir (TIVICAY) 50 MG tablet Take 1 tablet (50 mg total) by mouth daily. 30 tablet 1 07/17/2023 at  8:00 AM   EPINEPHrine 0.3 mg/0.3 mL IJ SOAJ injection Inject 0.3 mg into the muscle as needed for anaphylaxis.   Taking As Needed   famotidine (PEPCID) 20 MG tablet Take 20 mg by mouth 2 (two) times daily.   Taking   FIBER ADULT GUMMIES PO Take 1 tablet by mouth in the morning and at bedtime.   07/17/2023 at  8:00 PM   FLUoxetine (PROZAC) 40 MG capsule Take 40 mg by mouth at bedtime.   07/17/2023 at  8:00 PM   glycerin adult 2 g suppository Place 1 suppository rectally as needed for constipation.   Taking As Needed   hydrOXYzine (ATARAX) 25 MG tablet Take 1 tablet (25 mg total) by mouth 3 (three) times daily as needed. 60 tablet 1 Taking As Needed   ibuprofen (ADVIL) 800 MG tablet Take 1 tablet (800 mg total) by mouth every 8 (eight) hours as needed for moderate pain. 15 tablet 0 Taking As Needed   ketoconazole (NIZORAL) 2 % shampoo Apply 1 Application topically. Monday/ Wednesday/ Friday   07/17/2023 at  3:00 PM   linaclotide (LINZESS) 290 MCG CAPS capsule Take 290 mcg by mouth daily before breakfast.   07/17/2023 at  7:00 AM   magnesium citrate SOLN Take 1 Bottle by mouth daily as needed for severe constipation.   Taking As Needed   melatonin 5 MG TABS Take 0.5 tablets (2.5 mg total) by mouth at bedtime. 15 tablet 0 07/17/2023 at  8:00 PM   methocarbamol (ROBAXIN) 500 MG tablet Take 500 mg by mouth at bedtime.   Taking   montelukast (SINGULAIR) 10 MG tablet Take 10 mg by mouth daily.   07/17/2023 at  8:00 AM    nicotine (NICODERM CQ - DOSED IN MG/24 HR) 7 mg/24hr patch Place 1 patch (7 mg total) onto the skin daily. 360 patch 0 07/17/2023 at  8:00 AM   nicotine polacrilex (NICORETTE) 2 MG gum Take 2 mg by mouth as needed for smoking cessation.   Taking As Needed   nicotine polacrilex (NICOTINE MINI) 4 MG lozenge Take 1 lozenge (4 mg total) by mouth as needed. 100 tablet 0 Taking As Needed   ondansetron (ZOFRAN-ODT) 4 MG disintegrating tablet Take 1 tablet (4 mg total) by mouth every 8 (eight) hours as needed for nausea or vomiting. 20 tablet 0 Taking As Needed   prazosin (MINIPRESS) 5 MG capsule Take 1 capsule (5 mg total) by mouth at bedtime. 30 capsule 1 07/17/2023 at  8:00 PM   risperiDONE (RISPERDAL) 0.5 MG tablet Take 0.5 mg by mouth 2 (two) times daily. 0800/1600   07/17/2023 at  4:00 PM   topiramate (TOPAMAX) 50 MG tablet Take 1 tablet (50 mg total) by mouth 2 (two) times daily. (Patient taking differently: Take 50 mg by mouth at bedtime.) 60 tablet 1 07/17/2023  at  8:00 PM   traZODone (DESYREL) 50 MG tablet Take 50 mg by mouth at bedtime.   07/17/2023 at  8:00 PM   etonogestrel (NEXPLANON) 68 MG IMPL implant 1 each (68 mg total) by Subdermal route once for 1 dose. 1 each 0     Patient Stressors:    Patient Strengths:    Treatment Modalities: Medication Management, Group therapy, Case management,  1 to 1 session with clinician, Psychoeducation, Recreational therapy.   Physician Treatment Plan for Primary Diagnosis: Bipolar I disorder, most recent episode mixed (HCC) Long Term Goal(s): Improvement in symptoms so as ready for discharge   Short Term Goals: Ability to identify changes in lifestyle to reduce recurrence of condition will improve Ability to verbalize feelings will improve Ability to disclose and discuss suicidal ideas Ability to demonstrate self-control will improve Ability to identify and develop effective coping behaviors will improve Ability to maintain clinical measurements within  normal limits will improve Compliance with prescribed medications will improve Ability to identify triggers associated with substance abuse/mental health issues will improve  Medication Management: Evaluate patient's response, side effects, and tolerance of medication regimen.  Therapeutic Interventions: 1 to 1 sessions, Unit Group sessions and Medication administration.  Evaluation of Outcomes: Progressing  Physician Treatment Plan for Secondary Diagnosis: Principal Problem:   Bipolar I disorder, most recent episode mixed (HCC) Active Problems:   PTSD (post-traumatic stress disorder)   HIV disease (HCC)   Thrombosed hemorrhoids   Abdominal pain  Long Term Goal(s): Improvement in symptoms so as ready for discharge   Short Term Goals: Ability to identify changes in lifestyle to reduce recurrence of condition will improve Ability to verbalize feelings will improve Ability to disclose and discuss suicidal ideas Ability to demonstrate self-control will improve Ability to identify and develop effective coping behaviors will improve Ability to maintain clinical measurements within normal limits will improve Compliance with prescribed medications will improve Ability to identify triggers associated with substance abuse/mental health issues will improve     Medication Management: Evaluate patient's response, side effects, and tolerance of medication regimen.  Therapeutic Interventions: 1 to 1 sessions, Unit Group sessions and Medication administration.  Evaluation of Outcomes: Progressing   RN Treatment Plan for Primary Diagnosis: Bipolar I disorder, most recent episode mixed (HCC) Long Term Goal(s): Knowledge of disease and therapeutic regimen to maintain health will improve  Short Term Goals: Ability to verbalize frustration and anger appropriately will improve, Ability to demonstrate self-control, Ability to participate in decision making will improve, Ability to verbalize feelings  will improve, Ability to disclose and discuss suicidal ideas, Ability to identify and develop effective coping behaviors will improve, and Compliance with prescribed medications will improve  Medication Management: RN will administer medications as ordered by provider, will assess and evaluate patient's response and provide education to patient for prescribed medication. RN will report any adverse and/or side effects to prescribing provider.  Therapeutic Interventions: 1 on 1 counseling sessions, Psychoeducation, Medication administration, Evaluate responses to treatment, Monitor vital signs and CBGs as ordered, Perform/monitor CIWA, COWS, AIMS and Fall Risk screenings as ordered, Perform wound care treatments as ordered.  Evaluation of Outcomes: Progressing   LCSW Treatment Plan for Primary Diagnosis: Bipolar I disorder, most recent episode mixed (HCC) Long Term Goal(s): Safe transition to appropriate next level of care at discharge, Engage patient in therapeutic group addressing interpersonal concerns.  Short Term Goals: Engage patient in aftercare planning with referrals and resources, Increase social support, Increase ability to appropriately verbalize feelings, Increase  emotional regulation, and Facilitate acceptance of mental health diagnosis and concerns  Therapeutic Interventions: Assess for all discharge needs, 1 to 1 time with Social worker, Explore available resources and support systems, Assess for adequacy in community support network, Educate family and significant other(s) on suicide prevention, Complete Psychosocial Assessment, Interpersonal group therapy.  Evaluation of Outcomes: Progressing   Progress in Treatment: Attending groups: Yes. Participating in groups: Yes. Taking medication as prescribed: Yes. Toleration medication: Yes. Family/Significant other contact made: Yes, individual(s) contacted:  SPE completed with the patient's guardian.   Patient understands  diagnosis: No. Discussing patient identified problems/goals with staff: Yes. Medical problems stabilized or resolved: Yes. Denies suicidal/homicidal ideation: Yes. Issues/concerns per patient self-inventory: No. Other: none  New problem(s) identified: No, Describe:  none identified. Update 08/03/2023:  No changes at this time. Update 08/08/2023:  No changes at this time.    New Short Term/Long Term Goal(s): medication management for mood stabilization; elimination of SI thoughts; development of comprehensive mental wellness plan. Update 07/24/23: No changes at this time. Update: 07/29/2023: No changes at this time.  Update 08/03/2023:  No changes at this time. Update 08/08/2023:  No changes at this time.    Patient Goals:  Pt declined to participate in treatment team meeting. Update 07/24/23: No changes at this time.  Update 08/03/2023:  No changes at this time. Update 08/08/2023:  No changes at this time.    Discharge Plan or Barriers: CSW will assist pt/guardian with development of an appropriate aftercare/discharge plan. Update 07/24/23: Guardian notified team that pt will not be returning to group home. Update: 07/29/2023: No changes at this time.  Update 08/03/2023:  Patient's guardian continues to look for placement for the patient.  Patient remains safe on the unit. Update 08/08/2023:  Patient continues to wait for placement.  CSW has sent FL2 to the patient's guardian to assist in bed placement.     Reason for Continuation of Hospitalization: Aggression Depression Medication stabilization Suicidal ideation   Estimated Length of Stay: 1-7 days Update 07/24/23: No changes at this time. Update: 07/29/2023: No changes at this time  Update 08/03/2023:  TBD Update 08/08/2023:  TBD    Last 3 [place] Suicide Severity Risk Score: Flowsheet Row Admission (Current) from 07/18/2023 in [facility] INPATIENT BEHAV[facility]AL MEDICINE ED from 07/11/2023 in [facility] Emergency Department at [facility] Admission (Discharged)  from 07/05/2023 in [facility] INPATIENT BEHAVIORAL MEDICINE  C-SSRS RISK CATEGORY High Risk Error: Q3, 4, or 5 should not be populated when Q2 is No High Risk       Last PHQ 2/9 Scores:    05/04/2023    9:21 AM 02/16/2023    9:49 AM 11/17/2022   10:53 AM  Depression screen PHQ 2/9  Decreased Interest 0 0 0  Down, Depressed, Hopeless 0 0 1  PHQ - 2 Score 0 0 1    Scribe for Treatment Team: Harden Mo, LCSW 08/08/2023 9:59 AM

## 2023-08-08 NOTE — [facility] Counselor (Signed)
CSW spoke with the patient's guardian to follow up on placement.  CSW was informed that pt has an interview scheduled for 08/09/2023 at 10AM.  CSW to speak with patient.  [name], MSW, LCSW 08/08/2023 1:33 PM

## 2023-08-08 NOTE — Progress Notes (Signed)
Pt is on 1:1 while awake with a sitter present. Pt observed interacting appropriately with staff and peers on the unit. Pt compliant with medication administration per MD orders. Pt given education, support, and encouragement to be active in her treatment plan.

## 2023-08-08 NOTE — Plan of Care (Signed)
  Problem: Education: Goal: Knowledge of [facility] [place] [facility] Education information/materials will improve Outcome: Progressing Goal: Emotional status will improve Outcome: Progressing Goal: Mental status will improve Outcome: Progressing Goal: Verbalization of understanding the information provided will improve Outcome: Progressing   Problem: Activity: Goal: Interest or engagement in activities will improve Outcome: Progressing Goal: Sleeping patterns will improve Outcome: Progressing   Problem: Coping: Goal: Ability to verbalize frustrations and anger appropriately will improve Outcome: Progressing Goal: Ability to demonstrate self-control will improve Outcome: Progressing   Problem: Health Behavior/Discharge Planning: Goal: Identification of resources available to assist in meeting health care needs will improve Outcome: Progressing Goal: Compliance with treatment plan for underlying cause of condition will improve Outcome: Progressing   Problem: Physical Regulation: Goal: Ability to maintain clinical measurements within normal limits will improve Outcome: Progressing   Problem: Safety: Goal: Periods of time without injury will increase Outcome: Progressing   Problem: Education: Goal: Utilization of techniques to improve thought processes will improve Outcome: Progressing Goal: Knowledge of the prescribed therapeutic regimen will improve Outcome: Progressing   Problem: Activity: Goal: Interest or engagement in leisure activities will improve Outcome: Progressing Goal: Imbalance in normal sleep/wake cycle will improve Outcome: Progressing   Problem: Coping: Goal: Coping ability will improve Outcome: Progressing Goal: Will verbalize feelings Outcome: Progressing   Problem: Health Behavior/Discharge Planning: Goal: Ability to make decisions will improve Outcome: Progressing Goal: Compliance with therapeutic regimen will improve Outcome: Progressing    Problem: Role Relationship: Goal: Will demonstrate positive changes in social behaviors and relationships Outcome: Progressing   Problem: Safety: Goal: Ability to disclose and discuss suicidal ideas will improve Outcome: Progressing Goal: Ability to identify and utilize support systems that promote safety will improve Outcome: Progressing   Problem: Self-Concept: Goal: Will verbalize positive feelings about self Outcome: Progressing Goal: Level of anxiety will decrease Outcome: Progressing   Problem: Education: Goal: Knowledge of General Education information will improve Description: Including pain rating scale, medication(s)/side effects and non-pharmacologic comfort measures Outcome: Progressing   Problem: Health Behavior/Discharge Planning: Goal: Ability to manage health-related needs will improve Outcome: Progressing

## 2023-08-08 NOTE — Progress Notes (Signed)
   08/08/23 1600  Psych Admission Type (Psych Patients Only)  Admission Status Voluntary  Psychosocial Assessment  Patient Complaints Anxiety;Worrying (Patient states she was depressed last night due to a situation from yesterday, but denies any today; patient also states that she is worried about her phone call to her guardian tomorrow.)  Eye Contact Fair  Facial Expression Fixed smile  Affect Anxious;Appropriate to circumstance  Speech Logical/coherent;Soft  Interaction Childlike;Assertive  Motor Activity Slow  Appearance/Hygiene Unremarkable  Behavior Characteristics Cooperative;Appropriate to situation  Mood Pleasant  Aggressive Behavior  Effect No apparent injury  Thought Process  Coherency WDL  Content WDL  Delusions None reported or observed  Perception WDL  Hallucination None reported or observed  Judgment WDL  Confusion None  Danger to Self  Current suicidal ideation? Denies (patient states "I've been trying to do really good".)  Self-Injurious Behavior No self-injurious ideation or behavior indicators observed or expressed   Agreement Not to Harm Self Yes  Description of Agreement Verbal  Danger to Others  Danger to Others None reported or observed

## 2023-08-08 NOTE — Progress Notes (Signed)
   08/07/23 2230  Psych Admission Type (Psych Patients Only)  Admission Status Voluntary  Psychosocial Assessment  Patient Complaints Anxiety;Anger  Eye Contact Fair  Facial Expression Animated  Affect Preoccupied  Speech Logical/coherent  Interaction Assertive  Motor Activity Slow  Appearance/Hygiene Unremarkable  Behavior Characteristics Cooperative  Mood Pleasant  Aggressive Behavior  Effect No apparent injury  Thought Process  Coherency WDL  Content Religiosity  Delusions None reported or observed  Perception WDL  Hallucination None reported or observed  Judgment Limited  Confusion None  Danger to Self  Current suicidal ideation? Denies  Self-Injurious Behavior No self-injurious ideation or behavior indicators observed or expressed   Agreement Not to Harm Self Yes  Description of Agreement Verbal  Danger to Others  Danger to Others None reported or observed

## 2023-08-09 DIAGNOSIS — F3160 Bipolar disorder, current episode mixed, unspecified: Secondary | ICD-10-CM | POA: Diagnosis not present

## 2023-08-09 LAB — GLUCOSE, CAPILLARY
Glucose-Capillary: 151 mg/dL — ABNORMAL HIGH (ref 70–99)
Glucose-Capillary: 266 mg/dL — ABNORMAL HIGH (ref 70–99)
Glucose-Capillary: 92 mg/dL (ref 70–99)
Glucose-Capillary: 241 mg/dL — ABNORMAL HIGH (ref 70–99)

## 2023-08-09 MED ORDER — VALACYCLOVIR HCL 500 MG PO TABS
2000.0000 mg | ORAL_TABLET | Freq: Two times a day (BID) | ORAL | Status: AC
  Administered 2023-08-09 – 2023-08-10 (×2): 2000 mg via ORAL
  Filled 2023-08-09 (×2): qty 4

## 2023-08-09 NOTE — Group Note (Signed)
[facility] LCSW Group Therapy Note   Group Date: 08/09/2023 Start Time: 1300 End Time: 1400   Type of Therapy/Topic:  Group Therapy:  Emotion Regulation  Participation Level:  Did Not Attend   Mood:  Description of Group:    The purpose of this group is to assist patients in learning to regulate negative emotions and experience positive emotions. Patients will be guided to discuss ways in which they have been vulnerable to their negative emotions. These vulnerabilities will be juxtaposed with experiences of positive emotions or situations, and patients challenged to use positive emotions to combat negative ones. Special emphasis will be placed on coping with negative emotions in conflict situations, and patients will process healthy conflict resolution skills.  Therapeutic Goals: Patient will identify two positive emotions or experiences to reflect on in order to balance out negative emotions:  Patient will label two or more emotions that they find the most difficult to experience:  Patient will be able to demonstrate positive conflict resolution skills through discussion or role plays:   Summary of Patient Progress:   X    Therapeutic Modalities:   Cognitive Behavioral Therapy Feelings Identification Dialectical Behavioral Therapy   Harden Mo, LCSW

## 2023-08-09 NOTE — Plan of Care (Signed)
  Problem: Education: Goal: Knowledge of [facility] [place] [facility] Education information/materials will improve Outcome: Progressing Goal: Emotional status will improve Outcome: Progressing Goal: Mental status will improve Outcome: Progressing Goal: Verbalization of understanding the information provided will improve Outcome: Progressing   Problem: Activity: Goal: Interest or engagement in activities will improve Outcome: Progressing Goal: Sleeping patterns will improve Outcome: Progressing   Problem: Coping: Goal: Ability to verbalize frustrations and anger appropriately will improve Outcome: Progressing Goal: Ability to demonstrate self-control will improve Outcome: Progressing   Problem: Health Behavior/Discharge Planning: Goal: Identification of resources available to assist in meeting health care needs will improve Outcome: Progressing Goal: Compliance with treatment plan for underlying cause of condition will improve Outcome: Progressing   Problem: Physical Regulation: Goal: Ability to maintain clinical measurements within normal limits will improve Outcome: Progressing   Problem: Safety: Goal: Periods of time without injury will increase Outcome: Progressing   Problem: Education: Goal: Utilization of techniques to improve thought processes will improve Outcome: Progressing Goal: Knowledge of the prescribed therapeutic regimen will improve Outcome: Progressing   Problem: Activity: Goal: Interest or engagement in leisure activities will improve Outcome: Progressing Goal: Imbalance in normal sleep/wake cycle will improve Outcome: Progressing   Problem: Coping: Goal: Coping ability will improve Outcome: Progressing Goal: Will verbalize feelings Outcome: Progressing   Problem: Health Behavior/Discharge Planning: Goal: Ability to make decisions will improve Outcome: Progressing Goal: Compliance with therapeutic regimen will improve Outcome: Progressing    Problem: Role Relationship: Goal: Will demonstrate positive changes in social behaviors and relationships Outcome: Progressing   Problem: Safety: Goal: Ability to disclose and discuss suicidal ideas will improve Outcome: Progressing Goal: Ability to identify and utilize support systems that promote safety will improve Outcome: Progressing   Problem: Self-Concept: Goal: Will verbalize positive feelings about self Outcome: Progressing Goal: Level of anxiety will decrease Outcome: Progressing   Problem: Education: Goal: Knowledge of General Education information will improve Description: Including pain rating scale, medication(s)/side effects and non-pharmacologic comfort measures Outcome: Progressing   Problem: Health Behavior/Discharge Planning: Goal: Ability to manage health-related needs will improve Outcome: Progressing

## 2023-08-09 NOTE — Group Note (Signed)
Date:  08/09/2023 Time:  10:10 AM  Group Topic/Focus:  Goals Group:   The focus of this group is to help patients establish daily goals to achieve during treatment and discuss how the patient can incorporate goal setting into their daily lives to aide in recovery. Overcoming Stress:   The focus of this group is to define stress and help patients assess their triggers.    Participation Level:  Did Not Attend    Crystal Black 08/09/2023, 10:10 AM

## 2023-08-09 NOTE — Progress Notes (Signed)
   08/09/23 1200  Psych Admission Type (Psych Patients Only)  Admission Status Voluntary  Psychosocial Assessment  Patient Complaints Anxiety (patient states that she is "just a little nervous for the interview".)  Eye Contact Fair  Facial Expression Anxious  Affect Anxious;Appropriate to circumstance  Speech Logical/coherent;Soft  Interaction Childlike;Assertive;Needy  Motor Activity Slow  Appearance/Hygiene Unremarkable;In scrubs  Behavior Characteristics Cooperative;Appropriate to situation  Mood Pleasant (patient states that overall she is "doing good".)  Aggressive Behavior  Effect No apparent injury  Thought Process  Coherency WDL  Content WDL  Delusions None reported or observed  Perception WDL  Hallucination None reported or observed  Judgment WDL  Confusion None  Danger to Self  Current suicidal ideation? Denies  Self-Injurious Behavior No self-injurious ideation or behavior indicators observed or expressed   Agreement Not to Harm Self Yes  Description of Agreement Verbal  Danger to Others  Danger to Others None reported or observed

## 2023-08-09 NOTE — [facility] Counselor (Signed)
CSW received voicemail from patient's guardian.  Per message patient has been DENIED.  Guardian reports that the patient was "too much" for the home.  She also reports that the meeting remains reoccurring.  She reports that the [facility] providers will being additional potential providers to the call to interview patient.    [name] has declined.    Patient is not aware and CSW will inform on 08/10/2023.  [name], MSW, LCSW 08/09/2023 4:11 PM

## 2023-08-09 NOTE — Group Note (Signed)
Date:  08/09/2023 Time:  7:06 PM  Group Topic/Focus:  Healthy Communication:   The focus of this group is to discuss communication, barriers to communication, as well as healthy ways to communicate with others. Rediscovering Joy:   The focus of this group is to explore various ways to relieve stress in a positive manner.    Participation Level:  Active  Participation Quality:  Appropriate  Affect:  Appropriate  Cognitive:  Appropriate  Insight: Appropriate  Engagement in Group:  Developing/Improving  Modes of Intervention:  Activity and Discussion  Additional Comments:    Akshar Starnes 08/09/2023, 7:06 PM

## 2023-08-09 NOTE — Progress Notes (Signed)
1:1 Patient Monitoring (while awake)  0800: Patient is in the medication room, with this writer, receiving scheduled morning medication.  1100: Patient is in the hallway, waiting for her CBG, with her assigned safety sitter present.  1500: Patient is in the dayroom, playing a card game by herself, with her assigned safety sitter present.  1600: Patient is in the dayroom, eating dinner with other members on the unit and staff present.  1700: Patient is in the dayroom, writing in her journal, with her assigned safety sitter present.  1900: Patient remains in the dayroom, with staff and other members on the unit.

## 2023-08-09 NOTE — B[facility] Counselor (Signed)
CSW assisted patient with participating in an interview for a possible [facility] placement.    Patient's guardian organized the interview and this Clinical research associate sent the email link to guardian who supplied to the others in attendance.  Attendees: Rayvon Char, QP with [employer], [facility] owner Monique, [facility] Care Manager (not Care Manager currently but covering for Care Manager who is on maternity leave) [name], pts guardian Tomeka, [facility] staff  The meeting was held at [employer].  During the meeting the patient was asked questions about the patient's food preferences, food dislikes, allergies, what angers the patient, coping skills pt uses, how staff could recognize if patient is upset and pts triggers.   Patient was allowed to ask questions during the interview.  Patient was appropriate and displayed fair insight during interview.   CSW was informed that this Clinical research associate will hear by end of day if patient has been accepted or not.   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  She reports that she did not know a timeline and would defer to [facility], who unfortunately had to leave the call prematurely.  [name] asked for the meeting to be recurring Wednesday at 10:30AM for the foreseeable future until placement is secured.  CSW to send recurring meeting link.  CSW provided patient pen and journal to jot down any questions patient thinks of until the next meeting.  A[facility] is located in [place].  [name], MSW, LCSW 08/09/2023 12:37 PM

## 2023-08-09 NOTE — Group Note (Signed)
Date:  08/09/2023 Time:  9:19 PM  Group Topic/Focus:  Rediscovering Joy:   The focus of this group is to explore various ways to relieve stress in a positive manner.    Participation Level:  Active  Participation Quality:  Appropriate, Attentive, Sharing, and Supportive  Affect:  Appropriate  Cognitive:  Alert and Appropriate  Insight: Appropriate, Good, and Improving  Engagement in Group:  Developing/Improving, Engaged, and Supportive  Modes of Intervention:  Activity, Clarification, Discussion, Education, Problem-solving, Rapport Building, Role-play, and Support  Additional Comments:     Payten Beaumier 08/09/2023, 9:19 PM

## 2023-08-10 DIAGNOSIS — F3160 Bipolar disorder, current episode mixed, unspecified: Secondary | ICD-10-CM | POA: Diagnosis not present

## 2023-08-10 LAB — GLUCOSE, CAPILLARY
Glucose-Capillary: 275 mg/dL — ABNORMAL HIGH (ref 70–99)
Glucose-Capillary: 290 mg/dL — ABNORMAL HIGH (ref 70–99)
Glucose-Capillary: 347 mg/dL — ABNORMAL HIGH (ref 70–99)

## 2023-08-10 LAB — CREATININE, SERUM
Creatinine, Ser: 0.68 mg/dL (ref 0.44–1.00)
GFR, Estimated: >60 mL/min (ref >60)

## 2023-08-10 NOTE — Group Note (Signed)
Date:  08/10/2023 Time:  10:09 AM  Group Topic/Focus:  Healthy Communication:   The focus of this group is to discuss communication, barriers to communication, as well as healthy ways to communicate with others.    Participation Level:  Did Not Attend   Mary Sella Anastasya Jewell 08/10/2023, 10:09 AM

## 2023-08-10 NOTE — Group Note (Signed)
Recreation Therapy Group Note   Group Topic:Healthy Support Systems  Group Date: 08/10/2023 Start Time: 1000 End Time: 1050 Facilitators: Rosina Lowenstein, LRT, CTRS Location:  Craft Room  Group Description: Straw Bridge. In groups or individually, patients were given 10 plastic drinking straws and an equal length of masking tape. Using the materials provided, patients were instructed to build a free-standing bridge-like structure to suspend an everyday item (ex: deck of cards) off the floor or table surface. 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 LRT facilitated post-activity discussion reviewing the importance of having strong and healthy support systems in our lives. LRT discussed how the people in our lives serve as the tape and the deck of cards we placed on top of our straw structure are the stressors we face in daily life. LRT and pts discussed what happens in our life when things get too heavy for [place], and we don't have strong supports outside of the hospital. Pt shared 2 of their healthy supports in their life aloud in the group.   Goal Area(s) Addressed:  Patient will identify 2 healthy supports in their life. Patient will identify skills to successfully complete activity. Patient will identify correlation of this activity to life post-discharge.  Patient will build on frustration tolerance skills. Patient will increase team building and communication skills.    Affect/Mood: N/A   Participation Level: Did not attend    Clinical Observations/Individualized Feedback: Patient did not attend group.   Plan: Continue to engage patient in RT group sessions 2-3x/week.   Rosina Lowenstein, LRT, CTRS 08/10/2023 11:20 AM
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
## 2023-08-10 NOTE — Group Note (Signed)
Recreation Therapy Group Note   Group Topic:General Recreation  Group Date: 08/10/2023 Start Time: 1530 End Time: 1615 Facilitators: Rosina Lowenstein, LRT, CTRS Location: Courtyard  Group Description: [employer]. LRT and patients played games of basketball, drew with chalk, and played corn hole while outside in the courtyard while getting fresh air and sunlight. Music was being played in the background. LRT and peers conversed about different games they have played before, what they do in their free time and anything else that is on their minds. LRT encouraged pts to drink water after being outside, sweating and getting their heart rate up.  Goal Area(s) Addressed: Patient will build on frustration tolerance skills. Patients will partake in a competitive play game with peers. Patients will gain knowledge of new leisure interest/hobby.    Affect/Mood: Appropriate   Participation Level: Active   Participation Quality: Independent   Behavior: Appropriate   Speech/Thought Process: Coherent   Insight: Fair   Judgement: Fair    Modes of Intervention: Activity   Patient Response to Interventions:  Attentive and Receptive   Education Outcome:  Acknowledges education   Clinical Observations/Individualized Feedback: Jaretssi was active in their participation of session activities and group discussion. Pt identified that she was writing about her life in her journal. Pt interacted well with LRT and peers duration of session.    Plan: Continue to engage patient in RT group sessions 2-3x/week.   Rosina Lowenstein, LRT, CTRS 08/10/2023 5:21 PM
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
## 2023-08-10 NOTE — Plan of Care (Signed)
Problem: Education: Goal: Emotional status will improve Outcome: Progressing Goal: Mental status will improve Outcome: Progressing Goal: Verbalization of understanding the information provided will improve Outcome: Progressing   Problem: Coping: Goal: Ability to verbalize frustrations and anger appropriately will improve Outcome: Progressing   Problem: Safety: Goal: Periods of time without injury will increase Outcome: Progressing

## 2023-08-10 NOTE — Plan of Care (Signed)
Problem: Education: Goal: Emotional status will improve Outcome: Progressing Goal: Mental status will improve Outcome: Progressing Goal: Verbalization of understanding the information provided will improve Outcome: Progressing

## 2023-08-10 NOTE — Progress Notes (Signed)
Nursing 1:1 Note:  Patient in her room sleeping. No distress noted. Patient remains safe at this time.

## 2023-08-10 NOTE — Progress Notes (Signed)
Remains with sitter for safety. Pleasant and cooperative. Denies SI, HI, AVH. Medication compliant. Appropriate with staff and peers. No complaints or concerns voiced.  Encouragement and support provided. Safety checks maintained. Medication given as prescribed. Pt receptive and remains safe on unit with q 15 min checks.

## 2023-08-10 NOTE — Group Note (Signed)
[facility] LCSW Group Therapy Note   Group Date: 08/10/2023 Start Time: 1300 End Time: 1400   Type of Therapy/Topic:  Group Therapy:  Balance in Life  Participation Level:  Active   Description of Group:    This group will address the concept of balance and how it feels and looks when one is unbalanced. Patients will be encouraged to process areas in their lives that are out of balance, and identify reasons for remaining unbalanced. Facilitators will guide patients utilizing problem- solving interventions to address and correct the stressor making their life unbalanced. Understanding and applying boundaries will be explored and addressed for obtaining  and maintaining a balanced life. Patients will be encouraged to explore ways to assertively make their unbalanced needs known to significant others in their lives, using other group members and facilitator for support and feedback.  Therapeutic Goals: Patient will identify two or more emotions or situations they have that consume much of in their lives. Patient will identify signs/triggers that life has become out of balance:  Patient will identify two ways to set boundaries in order to achieve balance in their lives:  Patient will demonstrate ability to communicate their needs through discussion and/or role plays  Summary of Patient Progress: Patient came into group towards the end of the discussion. However during her time in the room she was very involved in the topic. Pt read through the entirety of the "7 Steps to finding balance in life" handout and provided feedback from her understanding. She appeared open and receptive to feedback/comments from both her peers and facilitator.    Therapeutic Modalities:   Cognitive Behavioral Therapy Solution-Focused Therapy Assertiveness Training   Glenis Smoker, LCSW

## 2023-08-11 DIAGNOSIS — F3160 Bipolar disorder, current episode mixed, unspecified: Secondary | ICD-10-CM | POA: Diagnosis not present

## 2023-08-11 LAB — GLUCOSE, CAPILLARY
Glucose-Capillary: 102 mg/dL — ABNORMAL HIGH (ref 70–99)
Glucose-Capillary: 204 mg/dL — ABNORMAL HIGH (ref 70–99)
Glucose-Capillary: 112 mg/dL — ABNORMAL HIGH (ref 70–99)

## 2023-08-11 NOTE — Inpatient Diabetes Management (Signed)
Inpatient Diabetes Program Recommendations  AACE/ADA: New Consensus Statement on Inpatient Glycemic Control (2015)  Target Ranges:  Prepandial:   less than 140 mg/dL      Peak postprandial:   less than 180 mg/dL (1-2 hours)      Critically ill patients:  140 - 180 mg/dL   Lab Results  Component Value Date   GLUCAP 112 (H) 08/11/2023   HGBA1C 6.2 (H) 07/08/2023    Review of Glycemic Control  Latest Reference Range & Units 08/09/23 07:30 08/09/23 11:04 08/09/23 15:55 08/09/23 20:06 08/10/23 12:03 08/10/23 16:13 08/10/23 21:02 08/11/23 08:10  Glucose-Capillary 70 - 99 mg/dL 92 295 (H) 621 (H) 308 (H) 347 (H) 102 (H) 204 (H) 112 (H)  (H): Data is abnormally high  Diabetes history: Pre-DM Outpatient Diabetes medications: None Current orders for Inpatient glycemic control: Metformin 850 mg BID  Inpatient Diabetes Program Recommendations:     Post prandial glucose elevated.  Might consider Novolog 0-9 units TID.    Will continue to follow while inpatient.  Thank you, Dulce Sellar, MSN, CDCES Diabetes Coordinator Inpatient Diabetes Program [phone] (team pager from 8a-5p)

## 2023-08-11 NOTE — Group Note (Signed)
Recreation Therapy Group Note   Group Topic:Leisure Education  Group Date: 08/11/2023 Start Time: 1000 End Time: 1100 Facilitators: Rosina Lowenstein, LRT, CTRS Location:  Craft Room  Group Description: Leisure. Patients were given the option to choose from singing karaoke, coloring mandalas, using oil pastels, journaling, or playing with play-doh. LRT and pts discussed the meaning of leisure, the importance of participating in leisure during their free time/when they're outside of the hospital, as well as how our leisure interests can also serve as coping skills.    Goal Area(s) Addressed:   Patient will identify a current leisure interest.   Patient will learn the definition of "leisure".  Patient will practice making a positive decision.  Patient will have the opportunity to try a new leisure activity.  Patient will communicate with peers and LRT.     Affect/Mood: N/A   Participation Level: Did not attend    Clinical Observations/Individualized Feedback: Patient did not attend group.   Plan: Continue to engage patient in RT group sessions 2-3x/week.   Rosina Lowenstein, LRT, CTRS 08/11/2023 11:13 AM

## 2023-08-11 NOTE — Progress Notes (Signed)
   08/11/23 1111  Psych Admission Type (Psych Patients Only)  Admission Status Voluntary  Psychosocial Assessment  Patient Complaints None  Eye Contact Fair  Facial Expression Animated  Affect Appropriate to circumstance  Speech Logical/coherent  Interaction Assertive  Motor Activity Slow  Appearance/Hygiene Unremarkable  Behavior Characteristics Cooperative  Mood Pleasant  Thought Process  Coherency WDL  Content WDL  Delusions None reported or observed  Perception WDL  Hallucination None reported or observed  Judgment Limited  Confusion None  Danger to Self  Current suicidal ideation? Denies  Self-Injurious Behavior No self-injurious ideation or behavior indicators observed or expressed   Agreement Not to Harm Self Yes  Description of Agreement verbal  Danger to Others  Danger to Others None reported or observed   Patient continues on 1:1 monitoring at line of site. Continues to take solace in writing in her journal. Attended group.
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
## 2023-08-11 NOTE — Plan of Care (Signed)
  Problem: Education: Goal: Knowledge of [facility] [place] [facility] Education information/materials will improve Outcome: Progressing Goal: Emotional status will improve Outcome: Progressing Goal: Mental status will improve Outcome: Progressing Goal: Verbalization of understanding the information provided will improve Outcome: Progressing   Problem: Activity: Goal: Interest or engagement in activities will improve Outcome: Progressing Goal: Sleeping patterns will improve Outcome: Progressing   Problem: Coping: Goal: Ability to verbalize frustrations and anger appropriately will improve Outcome: Progressing Goal: Ability to demonstrate self-control will improve Outcome: Progressing   Problem: Health Behavior/Discharge Planning: Goal: Identification of resources available to assist in meeting health care needs will improve Outcome: Progressing Goal: Compliance with treatment plan for underlying cause of condition will improve Outcome: Progressing   Problem: Physical Regulation: Goal: Ability to maintain clinical measurements within normal limits will improve Outcome: Progressing   Problem: Safety: Goal: Periods of time without injury will increase Outcome: Progressing   Problem: Education: Goal: Utilization of techniques to improve thought processes will improve Outcome: Progressing Goal: Knowledge of the prescribed therapeutic regimen will improve Outcome: Progressing   Problem: Activity: Goal: Interest or engagement in leisure activities will improve Outcome: Progressing Goal: Imbalance in normal sleep/wake cycle will improve Outcome: Progressing   Problem: Coping: Goal: Coping ability will improve Outcome: Progressing Goal: Will verbalize feelings Outcome: Progressing   Problem: Health Behavior/Discharge Planning: Goal: Ability to make decisions will improve Outcome: Progressing Goal: Compliance with therapeutic regimen will improve Outcome: Progressing    Problem: Role Relationship: Goal: Will demonstrate positive changes in social behaviors and relationships Outcome: Progressing   Problem: Safety: Goal: Ability to disclose and discuss suicidal ideas will improve Outcome: Progressing Goal: Ability to identify and utilize support systems that promote safety will improve Outcome: Progressing   Problem: Self-Concept: Goal: Will verbalize positive feelings about self Outcome: Progressing Goal: Level of anxiety will decrease Outcome: Progressing   Problem: Education: Goal: Knowledge of General Education information will improve Description: Including pain rating scale, medication(s)/side effects and non-pharmacologic comfort measures Outcome: Progressing   Problem: Health Behavior/Discharge Planning: Goal: Ability to manage health-related needs will improve Outcome: Progressing

## 2023-08-11 NOTE — Plan of Care (Signed)
Marland Kitchen

## 2023-08-11 NOTE — Progress Notes (Signed)
Pt denied SI/HI, AVH, depression and anxiety.  Visible in the milieu, positive interaction with peers and staff.  Took all scheduled medications.
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
## 2023-08-12 DIAGNOSIS — F3160 Bipolar disorder, current episode mixed, unspecified: Secondary | ICD-10-CM | POA: Diagnosis not present

## 2023-08-12 NOTE — Progress Notes (Signed)
1:1 Patient Monitoring (while awake):  1200: Patient is in the medication room, with this Clinical research associate as her assigned Recruitment consultant, getting her CBG check.  1300: Patient is in her bedroom, with this Clinical research associate as her assigned Recruitment consultant, talking about the incident that happened last evening/night.  1400: Patient is in the dayroom, playing cards with another member on the unit. This Clinical research associate is patient's assigned Recruitment consultant.  1500: Patient is sitting in the hallway chair, writing in her journal, with this Clinical research associate as her assigned Recruitment consultant.  1600: Patient is in the dayroom, listening to music with other members on the unit. This Clinical research associate is patient's assigned Recruitment consultant.  1700: Patient is in the dayroom, eating dinner, with other members on the unit and staff is present as her assigned Recruitment consultant.
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
## 2023-08-12 NOTE — Progress Notes (Signed)
   08/12/23 1355  Psych Admission Type (Psych Patients Only)  Admission Status Voluntary  Psychosocial Assessment  Patient Complaints Anxiety (patient states she's anxious, but doesn't know excatly why she's feeling this way.)  Eye Contact Fair;Watchful  Facial Expression Fixed smile  Affect Appropriate to circumstance  Speech Logical/coherent  Interaction Assertive  Motor Activity Slow  Appearance/Hygiene In scrubs;Unremarkable  Behavior Characteristics Cooperative;Appropriate to situation  Mood Pleasant (patient states "I'm feeling much better".)  Aggressive Behavior  Effect No apparent injury  Thought Process  Coherency WDL  Content WDL  Delusions None reported or observed  Perception WDL  Hallucination None reported or observed  Judgment WDL  Confusion None  Danger to Self  Current suicidal ideation? Denies  Self-Injurious Behavior No self-injurious ideation or behavior indicators observed or expressed   Agreement Not to Harm Self Yes  Description of Agreement Verbal  Danger to Others  Danger to Others None reported or observed  Danger to Others Abnormal  Harmful Behavior to others No threats or harm toward other people  Destructive Behavior No threats or harm toward property

## 2023-08-12 NOTE — Progress Notes (Signed)
Patient is still asleep. So, this writer will administer scheduled medication once she wakes up. PA was notified.

## 2023-08-12 NOTE — Progress Notes (Signed)
   08/12/23 1155  Point of Care Tests  CBG -Manual entry (!) 148

## 2023-08-12 NOTE — Group Note (Signed)
Date:  08/12/2023 Time:  12:24 AM  Group Topic/Focus:  Recovery Goals:   The focus of this group is to identify appropriate goals for recovery and establish a plan to achieve them.    Participation Level:  Active  Participation Quality:  Appropriate  Affect:  Appropriate  Cognitive:  Appropriate  Insight: Appropriate  Engagement in Group:  Engaged  Modes of Intervention:  Activity  Additional Comments:    Wende Longstreth 08/12/2023, 12:24 AM
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
## 2023-08-12 NOTE — Plan of Care (Signed)
  Problem: Education: Goal: Knowledge of [facility] Education information/materials will improve Outcome: Progressing   Problem: Education: Goal: Emotional status will improve Outcome: Progressing   Problem: Education: Goal: Mental status will improve Outcome: Progressing   Problem: Activity: Goal: Interest or engagement in activities will improve Outcome: Progressing

## 2023-08-12 NOTE — Progress Notes (Signed)
   08/12/23 1645  Point of Care Tests  CBG -Manual entry (!) 268

## 2023-08-12 NOTE — Progress Notes (Addendum)
Being that patient didn't receive 0800 Depakote until 1227, this writer moved 1700 dose to 2100. PA was notified and is ok with this change.

## 2023-08-12 NOTE — Plan of Care (Signed)
  Problem: Education: Goal: Knowledge of [facility] [place] [facility] Education information/materials will improve Outcome: Progressing Goal: Emotional status will improve Outcome: Progressing Goal: Mental status will improve Outcome: Progressing Goal: Verbalization of understanding the information provided will improve Outcome: Progressing   Problem: Activity: Goal: Interest or engagement in activities will improve Outcome: Progressing Goal: Sleeping patterns will improve Outcome: Progressing   Problem: Coping: Goal: Ability to verbalize frustrations and anger appropriately will improve Outcome: Progressing Goal: Ability to demonstrate self-control will improve Outcome: Progressing   Problem: Health Behavior/Discharge Planning: Goal: Identification of resources available to assist in meeting health care needs will improve Outcome: Progressing Goal: Compliance with treatment plan for underlying cause of condition will improve Outcome: Progressing   Problem: Physical Regulation: Goal: Ability to maintain clinical measurements within normal limits will improve Outcome: Progressing   Problem: Safety: Goal: Periods of time without injury will increase Outcome: Progressing   Problem: Education: Goal: Utilization of techniques to improve thought processes will improve Outcome: Progressing Goal: Knowledge of the prescribed therapeutic regimen will improve Outcome: Progressing   Problem: Activity: Goal: Interest or engagement in leisure activities will improve Outcome: Progressing Goal: Imbalance in normal sleep/wake cycle will improve Outcome: Progressing   Problem: Coping: Goal: Coping ability will improve Outcome: Progressing Goal: Will verbalize feelings Outcome: Progressing   Problem: Health Behavior/Discharge Planning: Goal: Ability to make decisions will improve Outcome: Progressing Goal: Compliance with therapeutic regimen will improve Outcome: Progressing    Problem: Role Relationship: Goal: Will demonstrate positive changes in social behaviors and relationships Outcome: Progressing   Problem: Safety: Goal: Ability to disclose and discuss suicidal ideas will improve Outcome: Progressing Goal: Ability to identify and utilize support systems that promote safety will improve Outcome: Progressing   Problem: Self-Concept: Goal: Will verbalize positive feelings about self Outcome: Progressing Goal: Level of anxiety will decrease Outcome: Progressing   Problem: Education: Goal: Knowledge of General Education information will improve Description: Including pain rating scale, medication(s)/side effects and non-pharmacologic comfort measures Outcome: Progressing   Problem: Health Behavior/Discharge Planning: Goal: Ability to manage health-related needs will improve Outcome: Progressing

## 2023-08-13 DIAGNOSIS — F3160 Bipolar disorder, current episode mixed, unspecified: Secondary | ICD-10-CM | POA: Diagnosis not present

## 2023-08-13 MED ORDER — MENTHOL 3 MG MT LOZG
1.0000 | LOZENGE | OROMUCOSAL | Status: DC | PRN
  Administered 2023-08-14 – 2023-09-07 (×28): 3 mg via ORAL
  Filled 2023-08-13 (×6): qty 9

## 2023-08-13 NOTE — Progress Notes (Signed)
   08/13/23 1616  Point of Care Tests  CBG -Manual entry (!) 235

## 2023-08-13 NOTE — Plan of Care (Signed)
  Problem: Education: Goal: Knowledge of [facility] [place] [facility] Education information/materials will improve Outcome: Progressing Goal: Emotional status will improve Outcome: Progressing Goal: Mental status will improve Outcome: Progressing Goal: Verbalization of understanding the information provided will improve Outcome: Progressing   Problem: Activity: Goal: Interest or engagement in activities will improve Outcome: Progressing Goal: Sleeping patterns will improve Outcome: Progressing   Problem: Coping: Goal: Ability to verbalize frustrations and anger appropriately will improve Outcome: Progressing Goal: Ability to demonstrate self-control will improve Outcome: Progressing   Problem: Health Behavior/Discharge Planning: Goal: Identification of resources available to assist in meeting health care needs will improve Outcome: Progressing Goal: Compliance with treatment plan for underlying cause of condition will improve Outcome: Progressing   Problem: Physical Regulation: Goal: Ability to maintain clinical measurements within normal limits will improve Outcome: Progressing   Problem: Safety: Goal: Periods of time without injury will increase Outcome: Progressing   Problem: Education: Goal: Utilization of techniques to improve thought processes will improve Outcome: Progressing Goal: Knowledge of the prescribed therapeutic regimen will improve Outcome: Progressing   Problem: Activity: Goal: Interest or engagement in leisure activities will improve Outcome: Progressing Goal: Imbalance in normal sleep/wake cycle will improve Outcome: Progressing   Problem: Coping: Goal: Coping ability will improve Outcome: Progressing Goal: Will verbalize feelings Outcome: Progressing   Problem: Health Behavior/Discharge Planning: Goal: Ability to make decisions will improve Outcome: Progressing Goal: Compliance with therapeutic regimen will improve Outcome: Progressing    Problem: Role Relationship: Goal: Will demonstrate positive changes in social behaviors and relationships Outcome: Progressing   Problem: Safety: Goal: Ability to disclose and discuss suicidal ideas will improve Outcome: Progressing Goal: Ability to identify and utilize support systems that promote safety will improve Outcome: Progressing   Problem: Self-Concept: Goal: Will verbalize positive feelings about self Outcome: Progressing Goal: Level of anxiety will decrease Outcome: Progressing   Problem: Education: Goal: Knowledge of General Education information will improve Description: Including pain rating scale, medication(s)/side effects and non-pharmacologic comfort measures Outcome: Progressing   Problem: Health Behavior/Discharge Planning: Goal: Ability to manage health-related needs will improve Outcome: Progressing

## 2023-08-13 NOTE — Progress Notes (Signed)
   08/12/23 2000  Psych Admission Type (Psych Patients Only)  Admission Status Voluntary  Psychosocial Assessment  Patient Complaints Anxiety  Eye Contact Fair  Facial Expression Animated  Affect Appropriate to circumstance;Inconsistent with thought content  Speech Logical/coherent;Slow  Interaction Assertive  Motor Activity Slow  Appearance/Hygiene Improved  Behavior Characteristics Appropriate to situation;Cooperative  Mood Pleasant  Thought Process  Coherency WDL  Content WDL  Delusions None reported or observed  Perception WDL  Hallucination None reported or observed  Judgment Limited  Confusion None  Danger to Self  Current suicidal ideation? Active  Self-Injurious Behavior Some self-injurious ideation observed or expressed.  No lethal plan expressed   Agreement Not to Harm Self Yes  Description of Agreement verbal  Danger to Others  Danger to Others Reported or observed  Danger to Others Abnormal  Harmful Behavior to others Threats of violence towards other people observed or expressed   Destructive Behavior Threats of violence towards property observed or expressed    Patient is alert and oriented x 4, affect is flat but brightens upon approach she denies SI/HI/AVH, she is interacting appropriately with peers and staff. Patient continues to be on 1:1 will continue to monitor.

## 2023-08-13 NOTE — Progress Notes (Signed)
   08/13/23 1200  Psych Admission Type (Psych Patients Only)  Admission Status Voluntary  Psychosocial Assessment  Patient Complaints None (patient states that she is "pretty good, I just want Wednesday to hurry up, for the interview".)  Eye Contact Fair;Watchful  Facial Expression Fixed smile  Affect Appropriate to circumstance  Speech Logical/coherent  Interaction Assertive  Motor Activity Slow  Appearance/Hygiene In scrubs;Unremarkable  Behavior Characteristics Cooperative;Appropriate to situation  Mood Pleasant  Aggressive Behavior  Effect No apparent injury  Thought Process  Coherency WDL  Content WDL  Delusions None reported or observed  Perception WDL  Hallucination None reported or observed  Judgment WDL  Confusion None  Danger to Self  Current suicidal ideation? Denies  Self-Injurious Behavior No self-injurious ideation or behavior indicators observed or expressed   Agreement Not to Harm Self Yes  Description of Agreement Verbal  Danger to Others  Danger to Others None reported or observed  Danger to Others Abnormal  Harmful Behavior to others No threats or harm toward other people  Destructive Behavior No threats or harm toward property

## 2023-08-13 NOTE — Plan of Care (Signed)
  Problem: Education: Goal: Emotional status will improve Outcome: Progressing   Problem: Education: Goal: Knowledge of [facility] Education information/materials will improve Outcome: Progressing   Problem: Education: Goal: Mental status will improve Outcome: Progressing

## 2023-08-13 NOTE — BH IP Treatment Plan (Signed)
Interdisciplinary Treatment and Diagnostic Plan Update  08/13/2023 Time of Session: 11:33 am Crystal Black MRN: [id number]  Principal Diagnosis: Bipolar I disorder, most recent episode mixed (HCC)  Secondary Diagnoses: Principal Problem:   Bipolar I disorder, most recent episode mixed (HCC) Active Problems:   PTSD (post-traumatic stress disorder)   HIV disease (HCC)   Thrombosed hemorrhoids   Abdominal pain   Current Medications:  Current Facility-Administered Medications  Medication Dose Route Frequency Provider Last Rate Last Admin   albuterol (VENTOLIN HFA) 108 (90 Base) MCG/ACT inhaler 2 puff  2 puff Inhalation Q4H PRN [name], NP   2 puff at 08/05/23 2136   alum & mag hydroxide-simeth (MAALOX/MYLANTA) 200-200-20 MG/5ML suspension 30 mL  30 mL Oral Q4H PRN McLauchlin, [name], NP   30 mL at 08/02/23 2036   ARIPiprazole ER (ABILIFY MAINTENA) injection 300 mg  300 mg Intramuscular Q28 days [name], NP   300 mg at 08/07/23 1009   darunavir-cobicistat (PREZCOBIX) 800-150 MG per tablet 1 tablet  1 tablet Oral Q breakfast [name], NP   1 tablet at 08/13/23 4098   haloperidol (HALDOL) tablet 5 mg  5 mg Oral TID PRN McLauchlin, [name], NP       And   diphenhydrAMINE (BENADRYL) capsule 50 mg  50 mg Oral TID PRN McLauchlin, [name], NP       haloperidol (HALDOL) tablet 5 mg  5 mg Oral TID PRN [name], NP   5 mg at 08/07/23 1805   And   diphenhydrAMINE (BENADRYL) capsule 50 mg  50 mg Oral TID PRN [name], NP   50 mg at 08/07/23 1805   haloperidol lactate (HALDOL) injection 5 mg  5 mg Intramuscular TID PRN McLauchlin, [name], NP       And   diphenhydrAMINE (BENADRYL) injection 50 mg  50 mg Intramuscular TID PRN McLauchlin, [name], NP   50 mg at 08/04/23 1412   And   LORazepam (ATIVAN) injection 2 mg  2 mg Intramuscular TID PRN McLauchlin, [name], NP   2 mg at 08/04/23 1412   haloperidol lactate (HALDOL) injection 10 mg  10 mg Intramuscular TID PRN  McLauchlin, [name], NP       And   diphenhydrAMINE (BENADRYL) injection 50 mg  50 mg Intramuscular TID PRN McLauchlin, [name], NP       And   LORazepam (ATIVAN) injection 2 mg  2 mg Intramuscular TID PRN McLauchlin, [name], NP       haloperidol lactate (HALDOL) injection 5 mg  5 mg Intramuscular TID PRN [name], NP       And   diphenhydrAMINE (BENADRYL) injection 50 mg  50 mg Intramuscular TID PRN [name], NP       And   LORazepam (ATIVAN) injection 2 mg  2 mg Intramuscular TID PRN [name], NP       haloperidol lactate (HALDOL) injection 10 mg  10 mg Intramuscular TID PRN [name], NP   10 mg at 08/11/23 2115   And   diphenhydrAMINE (BENADRYL) injection 50 mg  50 mg Intramuscular TID PRN [name], NP   50 mg at 08/11/23 2114   And   LORazepam (ATIVAN) injection 2 mg  2 mg Intramuscular TID PRN [name], NP   2 mg at 08/11/23 2115   divalproex (DEPAKOTE ER) 24 hr tablet 500 mg  500 mg Oral BID [name], NP   500 mg at  08/13/23 0816   docusate sodium (COLACE) capsule 200 mg  200 mg Oral Daily [name], [name], NP   200 mg at 08/12/23 1227   dolutegravir (TIVICAY) tablet 50 mg  50 mg Oral Daily [name], NP   50 mg at 08/13/23 0816   famotidine (PEPCID) tablet 20 mg  20 mg Oral Daily [name], NP   20 mg at 08/13/23 0817   feeding supplement (GLUCERNA SHAKE) (GLUCERNA SHAKE) liquid 237 mL  237 mL Oral TID BM [name], NP   237 mL at 08/11/23 2143   FLUoxetine (PROZAC) capsule 20 mg  20 mg Oral Daily [name], NP   20 mg at 08/13/23 0816   hydrocortisone (ANUSOL-HC) 2.5 % rectal cream   Rectal BID [name], MD   Given at 08/11/23 1656   hydrOXYzine (ATARAX) tablet 50 mg  50 mg Oral Q6H PRN [name], NP   50 mg at 08/10/23 1321   ibuprofen (ADVIL) tablet 600 mg  600 mg Oral Q8H PRN [name], NP   600 mg at 08/05/23 1652   influenza vac split trivalent PF (FLULAVAL) injection 0.5 mL  0.5  mL Intramuscular Tomorrow-1000 [name], MD       linaclotide [name]) capsule 290 mcg  290 mcg Oral QAC breakfast [name], NP   290 mcg at 08/13/23 4098   magnesium hydroxide (MILK OF MAGNESIA) suspension 30 mL  30 mL Oral Daily PRN McLauchlin, [name], NP   30 mL at 08/12/23 1721   melatonin tablet 5 mg  5 mg Oral QHS [name], NP   5 mg at 08/12/23 2142   metFORMIN (GLUCOPHAGE) tablet 500 mg  500 mg Oral Q breakfast Melanie Crazier, [name], NP   500 mg at 08/13/23 1191   methocarbamol (ROBAXIN) tablet 500 mg  500 mg Oral QHS [name], NP   500 mg at 08/12/23 2143   montelukast (SINGULAIR) tablet 5 mg  5 mg Oral QHS [name], [name], DO   5 mg at 08/12/23 2143   nicotine polacrilex (NICORETTE) gum 2 mg  2 mg Oral PRN [name], NP   2 mg at 08/13/23 0816   ondansetron (ZOFRAN) tablet 4 mg  4 mg Oral Q8H PRN [name], NP   4 mg at 08/08/23 1718   polyethylene glycol (MIRALAX / GLYCOLAX) packet 17 g  17 g Oral Daily [name], [name], NP   17 g at 08/13/23 0815   prazosin (MINIPRESS) capsule 5 mg  5 mg Oral QHS [name], [name], NP   5 mg at 08/11/23 2138   traZODone (DESYREL) tablet 50 mg  50 mg Oral QHS PRN McLauchlin, [name], NP   50 mg at 08/12/23 2143   PTA Medications: Medications Prior to Admission  Medication Sig Dispense Refill Last Dose/Taking   albuterol (VENTOLIN HFA) 108 (90 Base) MCG/ACT inhaler Inhale 2 puffs into the lungs every 4 (four) hours as needed for wheezing or shortness of breath.   Taking As Needed   ARIPiprazole (ABILIFY) 2 MG tablet Take 1 tablet (2 mg total) by mouth daily. 12 tablet 0 07/17/2023 at  8:00 AM   ARIPiprazole ER (ABILIFY MAINTENA) 400 MG SRER injection Inject 1.5 mLs (300 mg total) into the muscle every 28 (twenty-eight) days. 1.5 mL 0 06/30/2023   benztropine (COGENTIN) 1 MG tablet Take 1 tablet (1 mg total) by mouth 2 (two) times daily. 60 tablet 0 07/17/2023  at  8:00 PM   darunavir-cobicistat (PREZCOBIX) 800-150 MG tablet  Take 1 tablet by mouth daily with breakfast. Swallow whole. Do NOT crush, break or chew tablets. Take with food. 30 tablet 1 07/17/2023 at  8:00 AM   divalproex (DEPAKOTE ER) 250 MG 24 hr tablet Take 1 tablet (250 mg total) by mouth 2 (two) times daily. 60 tablet 0 07/17/2023 at  8:00 PM   docusate sodium (COLACE) 100 MG capsule Take 300 mg by mouth daily.   07/17/2023 at  8:00 AM   dolutegravir (TIVICAY) 50 MG tablet Take 1 tablet (50 mg total) by mouth daily. 30 tablet 1 07/17/2023 at  8:00 AM   EPINEPHrine 0.3 mg/0.3 mL IJ SOAJ injection Inject 0.3 mg into the muscle as needed for anaphylaxis.   Taking As Needed   famotidine (PEPCID) 20 MG tablet Take 20 mg by mouth 2 (two) times daily.   Taking   FIBER ADULT GUMMIES PO Take 1 tablet by mouth in the morning and at bedtime.   07/17/2023 at  8:00 PM   FLUoxetine (PROZAC) 40 MG capsule Take 40 mg by mouth at bedtime.   07/17/2023 at  8:00 PM   glycerin adult 2 g suppository Place 1 suppository rectally as needed for constipation.   Taking As Needed   hydrOXYzine (ATARAX) 25 MG tablet Take 1 tablet (25 mg total) by mouth 3 (three) times daily as needed. 60 tablet 1 Taking As Needed   ibuprofen (ADVIL) 800 MG tablet Take 1 tablet (800 mg total) by mouth every 8 (eight) hours as needed for moderate pain. 15 tablet 0 Taking As Needed   ketoconazole (NIZORAL) 2 % shampoo Apply 1 Application topically. Monday/ Wednesday/ Friday   07/17/2023 at  3:00 PM   linaclotide (LINZESS) 290 MCG CAPS capsule Take 290 mcg by mouth daily before breakfast.   07/17/2023 at  7:00 AM   magnesium citrate SOLN Take 1 Bottle by mouth daily as needed for severe constipation.   Taking As Needed   melatonin 5 MG TABS Take 0.5 tablets (2.5 mg total) by mouth at bedtime. 15 tablet 0 07/17/2023 at  8:00 PM   methocarbamol (ROBAXIN) 500 MG tablet Take 500 mg by mouth at bedtime.   Taking   montelukast (SINGULAIR) 10 MG tablet Take 10 mg by mouth daily.   07/17/2023 at  8:00 AM   nicotine  (NICODERM CQ - DOSED IN MG/24 HR) 7 mg/24hr patch Place 1 patch (7 mg total) onto the skin daily. 360 patch 0 07/17/2023 at  8:00 AM   nicotine polacrilex (NICORETTE) 2 MG gum Take 2 mg by mouth as needed for smoking cessation.   Taking As Needed   nicotine polacrilex (NICOTINE MINI) 4 MG lozenge Take 1 lozenge (4 mg total) by mouth as needed. 100 tablet 0 Taking As Needed   ondansetron (ZOFRAN-ODT) 4 MG disintegrating tablet Take 1 tablet (4 mg total) by mouth every 8 (eight) hours as needed for nausea or vomiting. 20 tablet 0 Taking As Needed   prazosin (MINIPRESS) 5 MG capsule Take 1 capsule (5 mg total) by mouth at bedtime. 30 capsule 1 07/17/2023 at  8:00 PM   risperiDONE (RISPERDAL) 0.5 MG tablet Take 0.5 mg by mouth 2 (two) times daily. 0800/1600   07/17/2023 at  4:00 PM   topiramate (TOPAMAX) 50 MG tablet Take 1 tablet (50 mg total) by mouth 2 (two) times daily. (Patient taking differently: Take 50 mg by mouth at bedtime.) 60 tablet 1 07/17/2023  at  8:00 PM   traZODone (DESYREL) 50 MG tablet Take 50 mg by mouth at bedtime.   07/17/2023 at  8:00 PM   etonogestrel (NEXPLANON) 68 MG IMPL implant 1 each (68 mg total) by Subdermal route once for 1 dose. 1 each 0     Patient Stressors:    Patient Strengths:    Treatment Modalities: Medication Management, Group therapy, Case management,  1 to 1 session with clinician, Psychoeducation, Recreational therapy.   Physician Treatment Plan for Primary Diagnosis: Bipolar I disorder, most recent episode mixed (HCC) Long Term Goal(s): Improvement in symptoms so as ready for discharge   Short Term Goals: Ability to identify changes in lifestyle to reduce recurrence of condition will improve Ability to verbalize feelings will improve Ability to disclose and discuss suicidal ideas Ability to demonstrate self-control will improve Ability to identify and develop effective coping behaviors will improve Ability to maintain clinical measurements within normal  limits will improve Compliance with prescribed medications will improve Ability to identify triggers associated with substance abuse/mental health issues will improve  Medication Management: Evaluate patient's response, side effects, and tolerance of medication regimen.  Therapeutic Interventions: 1 to 1 sessions, Unit Group sessions and Medication administration.  Evaluation of Outcomes: Progressing  Physician Treatment Plan for Secondary Diagnosis: Principal Problem:   Bipolar I disorder, most recent episode mixed (HCC) Active Problems:   PTSD (post-traumatic stress disorder)   HIV disease (HCC)   Thrombosed hemorrhoids   Abdominal pain  Long Term Goal(s): Improvement in symptoms so as ready for discharge   Short Term Goals: Ability to identify changes in lifestyle to reduce recurrence of condition will improve Ability to verbalize feelings will improve Ability to disclose and discuss suicidal ideas Ability to demonstrate self-control will improve Ability to identify and develop effective coping behaviors will improve Ability to maintain clinical measurements within normal limits will improve Compliance with prescribed medications will improve Ability to identify triggers associated with substance abuse/mental health issues will improve     Medication Management: Evaluate patient's response, side effects, and tolerance of medication regimen.  Therapeutic Interventions: 1 to 1 sessions, Unit Group sessions and Medication administration.  Evaluation of Outcomes: Progressing   RN Treatment Plan for Primary Diagnosis: Bipolar I disorder, most recent episode mixed (HCC) Long Term Goal(s): Knowledge of disease and therapeutic regimen to maintain health will improve  Short Term Goals: Ability to remain free from injury will improve, Ability to verbalize frustration and anger appropriately will improve, Ability to demonstrate self-control, Ability to participate in decision making  will improve, Ability to verbalize feelings will improve, Ability to disclose and discuss suicidal ideas, Ability to identify and develop effective coping behaviors will improve, and Compliance with prescribed medications will improve  Medication Management: RN will administer medications as ordered by provider, will assess and evaluate patient's response and provide education to patient for prescribed medication. RN will report any adverse and/or side effects to prescribing provider.  Therapeutic Interventions: 1 on 1 counseling sessions, Psychoeducation, Medication administration, Evaluate responses to treatment, Monitor vital signs and CBGs as ordered, Perform/monitor CIWA, COWS, AIMS and Fall Risk screenings as ordered, Perform wound care treatments as ordered.  Evaluation of Outcomes: Progressing   LCSW Treatment Plan for Primary Diagnosis: Bipolar I disorder, most recent episode mixed (HCC) Long Term Goal(s): Safe transition to appropriate next level of care at discharge, Engage patient in therapeutic group addressing interpersonal concerns.  Short Term Goals: Engage patient in aftercare planning with referrals and resources, Increase social  support, Increase ability to appropriately verbalize feelings, Increase emotional regulation, Facilitate acceptance of mental health diagnosis and concerns, and Increase skills for wellness and recovery  Therapeutic Interventions: Assess for all discharge needs, 1 to 1 time with Social worker, Explore available resources and support systems, Assess for adequacy in community support network, Educate family and significant other(s) on suicide prevention, Complete Psychosocial Assessment, Interpersonal group therapy.  Evaluation of Outcomes: Progressing   Progress in Treatment: Attending groups: Yes. and No. Participating in groups: Yes. and No. Taking medication as prescribed: Yes. Toleration medication: Yes. Family/Significant other contact made:  Yes, individual(s) contacted:  guardian, [name], [phone] Patient understands diagnosis: Yes. Discussing patient identified problems/goals with staff: Yes. Medical problems stabilized or resolved: Yes. Denies suicidal/homicidal ideation: Yes. Issues/concerns per patient self-inventory: No. Other: None  New problem(s) identified: No, Describe:  none identified. Update 08/03/2023:  No changes at this time. Update 08/08/2023:  No changes at this time. Update 08/13/2023: No changes at this time.    New Short Term/Long Term Goal(s): medication management for mood stabilization; elimination of SI thoughts; development of comprehensive mental wellness plan. Update 07/24/23: No changes at this time. Update: 07/29/2023: No changes at this time.  Update 08/03/2023:  No changes at this time. Update 08/08/2023:  No changes at this time.  Update 08/13/2023: No changes at this time.    Patient Goals:  Pt declined to participate in treatment team meeting. Update 07/24/23: No changes at this time.  Update 08/03/2023:  No changes at this time. Update 08/08/2023:  No changes at this time.   Update 08/13/2023: No changes at this time.    Discharge Plan or Barriers: CSW will assist pt/guardian with development of an appropriate aftercare/discharge plan. Update 07/24/23: Guardian notified team that pt will not be returning to group home. Update: 07/29/2023: No changes at this time.  Update 08/03/2023:  Patient's guardian continues to look for placement for the patient.  Patient remains safe on the unit. Update 08/08/2023:  Patient continues to wait for placement.  CSW has sent FL2 to the patient's guardian to assist in bed placement.   Update 08/13/2023: The patient interview for [facility] was not successful, she has interviews with her guardian and other [facility] providers coming up.    Reason for Continuation of Hospitalization: Aggression Depression Medication stabilization Suicidal ideation   Estimated Length of Stay: 1-7 days  Update 07/24/23: No changes at this time. Update: 07/29/2023: No changes at this time  Update 08/03/2023:  TBD Update 08/08/2023:  TBD  Update 08/13/2023: TBD  Last 3 [place] Suicide Severity Risk Score: Flowsheet Row Admission (Current) from 07/18/2023 in [facility] INPATIENT BEHAVIORAL MEDICINE ED from 07/11/2023 in [facility] Emergency Department at [facility] Admission (Discharged) from 07/05/2023 in [facility] INPATIENT BEHAVIORAL MEDICINE  C-SSRS RISK CATEGORY High Risk Error: Q3, 4, or 5 should not be populated when Q2 is No High Risk       Last PHQ 2/9 Scores:    05/04/2023    9:21 AM 02/16/2023    9:49 AM 11/17/2022   10:53 AM  Depression screen PHQ 2/9  Decreased Interest 0 0 0  Down, Depressed, Hopeless 0 0 1  PHQ - 2 Score 0 0 1    Scribe for Treatment Team: Marshell Levan, LCSW 08/13/2023 11:33 AM

## 2023-08-13 NOTE — Progress Notes (Signed)
1:1 Patient Monitoring (while awake):  0800: Patient is in the dayroom, eating breakfast.  0900: Patient is in the dayroom, talking to her assigned safety sitter.  1200: Patient is in the dayroom, eating lunch with other members on the unit and staff is present.  1300: Patient is in the dayroom, playing cards with another member on the unit. This Clinical research associate is her assigned Recruitment consultant.  1400: Patient is on the phone, with her assigned safety sitter present.  1500: Patient is in the dayroom, playing cards and coloring. Patient's assigned safety sitter is present.  1600: Patient is in the courtyard, with staff and other members on the unit.  1700: Patient is in the dayroom, eating dinner with staff present.  1800: Patient is in the dayroom, playing cards with other members on the unit, and staff is present.  1900: Patient is in the hallway, with staff.

## 2023-08-13 NOTE — Progress Notes (Signed)
   08/13/23 1156  Point of Care Tests  CBG -Manual entry (!) 176

## 2023-08-13 NOTE — Group Note (Signed)
Date:  08/13/2023 Time:  10:59 PM  Group Topic/Focus:  Wrap-Up Group:   The focus of this group is to help patients review their daily goal of treatment and discuss progress on daily workbooks.    Participation Level:  Minimal  Participation Quality:  Appropriate and Sharing  Affect:  Appropriate  Cognitive:  Appropriate  Insight: Appropriate  Engagement in Group:  Engaged  Modes of Intervention:  Discussion  Additional Comments:     [name] 08/13/2023, 10:59 PM

## 2023-08-13 NOTE — Progress Notes (Signed)
   08/13/23 0754  Point of Care Tests  CBG -Manual entry (!) 108
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
## 2023-08-14 DIAGNOSIS — F3160 Bipolar disorder, current episode mixed, unspecified: Secondary | ICD-10-CM | POA: Diagnosis not present

## 2023-08-14 LAB — GLUCOSE, CAPILLARY
Glucose-Capillary: 128 mg/dL — ABNORMAL HIGH (ref 70–99)
Glucose-Capillary: 192 mg/dL — ABNORMAL HIGH (ref 70–99)
Glucose-Capillary: 237 mg/dL — ABNORMAL HIGH (ref 70–99)
Glucose-Capillary: 148 mg/dL — ABNORMAL HIGH (ref 70–99)
Glucose-Capillary: 268 mg/dL — ABNORMAL HIGH (ref 70–99)
Glucose-Capillary: 308 mg/dL — ABNORMAL HIGH (ref 70–99)
Glucose-Capillary: 108 mg/dL — ABNORMAL HIGH (ref 70–99)
Glucose-Capillary: 176 mg/dL — ABNORMAL HIGH (ref 70–99)
Glucose-Capillary: 235 mg/dL — ABNORMAL HIGH (ref 70–99)
Glucose-Capillary: 353 mg/dL — ABNORMAL HIGH (ref 70–99)
Glucose-Capillary: 114 mg/dL — ABNORMAL HIGH (ref 70–99)

## 2023-08-14 NOTE — Group Note (Signed)
Recreation Therapy Group Note   Group Topic:Coping Skills  Group Date: 08/14/2023 Start Time: 1000 End Time: 1045 Facilitators: Rosina Lowenstein, LRT, CTRS Location:  Craft Room  Group Description: Mind Map.  Patient was provided a blank template of a diagram with 32 blank boxes in a tiered system, branching from the center (similar to a bubble chart). LRT directed patients to label the middle of the diagram "Coping Skills". LRT and patients then came up with 8 different coping skills as examples. Pt were directed to record their coping skills in the 2nd tier boxes closest to the center.  Patients would then share their coping skills with the group as LRT wrote them out. LRT gave a handout of 99 different coping skills at the end of group.   Goal Area(s) Addressed: Patients will be able to define "coping skills". Patient will identify new coping skills.  Patient will increase communication.   Affect/Mood: Appropriate   Participation Level: Active and Engaged   Participation Quality: Independent   Behavior: Appropriate, Calm, and Cooperative   Speech/Thought Process: Coherent   Insight: Good and Improved   Judgement: Good   Modes of Intervention: Clarification, Education, Guided Discussion, Socialization, and Worksheet   Patient Response to Interventions:  Attentive, Engaged, Interested , and Receptive   Education Outcome:  Acknowledges education   Clinical Observations/Individualized Feedback: Vilma was active in their participation of session activities and group discussion. Pt identified "roller skating, music, singing, cards" as coping skills. Pt spontaneously contributed to group discussion. Pt shared that she was able to identify more coping skills this session than in the previous session when this intervention was done. Pt interacted well with LRT and peers duration of session.    Plan: Continue to engage patient in RT group sessions 2-3x/week.   Rosina Lowenstein,  LRT, CTRS 08/14/2023 12:37 PM

## 2023-08-14 NOTE — Group Note (Signed)
Date:  08/14/2023 Time:  10:18 AM  Group Topic/Focus:  Diagnosis Education:   The focus of this group is to discuss the major disorders that patients maybe diagnosed with.  Group discusses the importance of knowing what one's diagnosis is so that one can understand treatment and better advocate for oneself. Healthy Communication:   The focus of this group is to discuss communication, barriers to communication, as well as healthy ways to communicate with others.    Participation Level:  Active  Participation Quality:  Appropriate  Affect:  Appropriate  Cognitive:  Appropriate  Insight: Appropriate  Engagement in Group:  Developing/Improving  Modes of Intervention:  Activity  Additional Comments:    Xaviera Flaten 08/14/2023, 10:18 AM

## 2023-08-14 NOTE — Group Note (Signed)
[facility] LCSW Group Therapy Note    Group Date: 08/14/2023 Start Time: 1330 End Time: 1430  Type of Therapy and Topic:  Group Therapy:  Overcoming Obstacles  Participation Level:  [facility] PARTICIPATION LEVEL: Active  Mood:  Description of Group:   In this group patients will be encouraged to explore what they see as obstacles to their own wellness and recovery. They will be guided to discuss their thoughts, feelings, and behaviors related to these obstacles. The group will process together ways to cope with barriers, with attention given to specific choices patients can make. Each patient will be challenged to identify changes they are motivated to make in order to overcome their obstacles. This group will be process-oriented, with patients participating in exploration of their own experiences as well as giving and receiving support and challenge from other group members.  Therapeutic Goals: 1. Patient will identify personal and current obstacles as they relate to admission. 2. Patient will identify barriers that currently interfere with their wellness or overcoming obstacles.  3. Patient will identify feelings, thought process and behaviors related to these barriers. 4. Patient will identify two changes they are willing to make to overcome these obstacles:    Summary of Patient Progress Patient was present in group.  Patient was attentive, supportive and engaged with others in group. Patient shared her coping skills as being deep breathing, writing, riding a bike and music.  She reported her obstacle as being substance use.  She was appropriate and displayed fair insight.    Therapeutic Modalities:   Cognitive Behavioral Therapy Solution Focused Therapy Motivational Interviewing Relapse Prevention Therapy   Harden Mo, LCSW

## 2023-08-14 NOTE — Progress Notes (Signed)
1:1 Patient Monitoring (while awake)  1000: Patient is in the hallway, with staff.  1100: Patient is in the dayroom, waiting for lunch, with staff present.  1200: Patient is in her room, journaling, with this Clinical research associate as her assigned Recruitment consultant.  1400: Patient is in social work group.  1500: Patient is in the dayroom.  1600: Patient is in the dayroom.  1700: Patient is in the dayroom, eating dinner.  1800: Patient is in the dayroom, talking to MHT.  1900: Patient is in her room, journaling.
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
## 2023-08-14 NOTE — Progress Notes (Signed)
Patient is asleep. So, this writer will administer scheduled medication once she wakes up.

## 2023-08-14 NOTE — Plan of Care (Signed)
   Problem: Education: Goal: Emotional status will improve Outcome: Progressing   Problem: Education: Goal: Mental status will improve Outcome: Progressing

## 2023-08-14 NOTE — Progress Notes (Signed)
Patient slept through breakfast, so there was no CBG check done at 0700.    08/14/23 1047  Point of Care Tests  CBG -Manual entry (!) 114

## 2023-08-14 NOTE — Progress Notes (Signed)
   08/14/23 1552  Point of Care Tests  CBG -Manual entry (!) 247

## 2023-08-14 NOTE — Group Note (Signed)
Date:  08/14/2023 Time:  10:49 PM  Group Topic/Focus:  Self Care:   The focus of this group is to help patients understand the importance of self-care in order to improve or restore emotional, physical, spiritual, interpersonal, and financial health. Wellness Toolbox:   The focus of this group is to discuss various aspects of wellness, balancing those aspects and exploring ways to increase the ability to experience wellness.  Patients will create a wellness toolbox for use upon discharge. Wrap-Up Group:   The focus of this group is to help patients review their daily goal of treatment and discuss progress on daily workbooks.    Participation Level:  Active  Participation Quality:  Appropriate and Attentive  Affect:  Appropriate  Cognitive:  Alert and Appropriate  Insight: Appropriate  Engagement in Group:  Engaged and Improving  Modes of Intervention:  Discussion, Socialization, and Support  Additional Comments:     Katina Dung 08/14/2023, 10:49 PM

## 2023-08-14 NOTE — Progress Notes (Signed)
   08/14/23 1800  Psych Admission Type (Psych Patients Only)  Admission Status Voluntary  Psychosocial Assessment  Patient Complaints None  Eye Contact Fair;Watchful  Facial Expression Fixed smile;Animated  Affect Appropriate to circumstance  Speech Logical/coherent  Interaction Assertive  Motor Activity Slow  Appearance/Hygiene Unremarkable  Behavior Characteristics Cooperative;Appropriate to situation  Mood Pleasant (patient is excited and waiting for her next interview on Wednesday)  Aggressive Behavior  Effect No apparent injury  Thought Process  Coherency WDL  Content WDL  Delusions None reported or observed  Perception WDL  Hallucination None reported or observed  Judgment WDL  Confusion None  Danger to Self  Current suicidal ideation? Denies  Self-Injurious Behavior No self-injurious ideation or behavior indicators observed or expressed   Agreement Not to Harm Self Yes  Description of Agreement Verbal  Danger to Others  Danger to Others None reported or observed  Danger to Others Abnormal  Harmful Behavior to others No threats or harm toward other people  Destructive Behavior No threats or harm toward property

## 2023-08-14 NOTE — Inpatient Diabetes Management (Signed)
Inpatient Diabetes Program Recommendations  AACE/ADA: New Consensus Statement on Inpatient Glycemic Control   Target Ranges:  Prepandial:   less than 140 mg/dL      Peak postprandial:   less than 180 mg/dL (1-2 hours)      Critically ill patients:  140 - 180 mg/dL    Latest Reference Range & Units 08/13/23 07:54 08/13/23 11:56 08/13/23 16:16 08/13/23 20:52 08/14/23 10:47  Glucose-Capillary 70 - 99 mg/dL 604 (H) 540 (H) 981 (H) 353 (H) 114 (H)   Review of Glycemic Control  Current orders for Inpatient glycemic control: Metformin 500 mg QAM  Inpatient Diabetes Program Recommendations:    DM medications: Please consider increasing Metformin to 500 mg BID. May also want to order Novolog 0-9 units TID with meals for correction.  Diet: Would recommend discontinuing Regular diet and ordering Carb Modified diet if appropriate.  Thanks, Orlando Penner, RN, MSN, CDCES Diabetes Coordinator Inpatient Diabetes Program [phone] (Team Pager from 8am to 5pm)

## 2023-08-14 NOTE — Plan of Care (Signed)
  Problem: Education: Goal: Knowledge of [facility] [place] [facility] Education information/materials will improve Outcome: Progressing Goal: Emotional status will improve Outcome: Progressing Goal: Mental status will improve Outcome: Progressing Goal: Verbalization of understanding the information provided will improve Outcome: Progressing   Problem: Activity: Goal: Interest or engagement in activities will improve Outcome: Progressing Goal: Sleeping patterns will improve Outcome: Progressing   Problem: Coping: Goal: Ability to verbalize frustrations and anger appropriately will improve Outcome: Progressing Goal: Ability to demonstrate self-control will improve Outcome: Progressing   Problem: Health Behavior/Discharge Planning: Goal: Identification of resources available to assist in meeting health care needs will improve Outcome: Progressing Goal: Compliance with treatment plan for underlying cause of condition will improve Outcome: Progressing   Problem: Physical Regulation: Goal: Ability to maintain clinical measurements within normal limits will improve Outcome: Progressing   Problem: Safety: Goal: Periods of time without injury will increase Outcome: Progressing   Problem: Education: Goal: Utilization of techniques to improve thought processes will improve Outcome: Progressing Goal: Knowledge of the prescribed therapeutic regimen will improve Outcome: Progressing   Problem: Activity: Goal: Interest or engagement in leisure activities will improve Outcome: Progressing Goal: Imbalance in normal sleep/wake cycle will improve Outcome: Progressing   Problem: Coping: Goal: Coping ability will improve Outcome: Progressing Goal: Will verbalize feelings Outcome: Progressing   Problem: Health Behavior/Discharge Planning: Goal: Ability to make decisions will improve Outcome: Progressing Goal: Compliance with therapeutic regimen will improve Outcome: Progressing    Problem: Role Relationship: Goal: Will demonstrate positive changes in social behaviors and relationships Outcome: Progressing   Problem: Safety: Goal: Ability to disclose and discuss suicidal ideas will improve Outcome: Progressing Goal: Ability to identify and utilize support systems that promote safety will improve Outcome: Progressing   Problem: Self-Concept: Goal: Will verbalize positive feelings about self Outcome: Progressing Goal: Level of anxiety will decrease Outcome: Progressing   Problem: Education: Goal: Knowledge of General Education information will improve Description: Including pain rating scale, medication(s)/side effects and non-pharmacologic comfort measures Outcome: Progressing   Problem: Health Behavior/Discharge Planning: Goal: Ability to manage health-related needs will improve Outcome: Progressing

## 2023-08-15 DIAGNOSIS — F3160 Bipolar disorder, current episode mixed, unspecified: Secondary | ICD-10-CM | POA: Diagnosis not present

## 2023-08-15 LAB — GLUCOSE, CAPILLARY
Glucose-Capillary: 247 mg/dL — ABNORMAL HIGH (ref 70–99)
Glucose-Capillary: 261 mg/dL — ABNORMAL HIGH (ref 70–99)
Glucose-Capillary: 152 mg/dL — ABNORMAL HIGH (ref 70–99)
Glucose-Capillary: 184 mg/dL — ABNORMAL HIGH (ref 70–99)

## 2023-08-15 MED ORDER — GLUCERNA SHAKE PO LIQD
237.0000 mL | Freq: Three times a day (TID) | ORAL | Status: DC
  Administered 2023-08-23 – 2023-09-03 (×15): 237 mL via ORAL

## 2023-08-15 MED ORDER — MAGNESIUM CITRATE PO SOLN
0.5000 | Freq: Once | ORAL | Status: AC
  Administered 2023-08-15: 0.5 via ORAL
  Filled 2023-08-15: qty 296

## 2023-08-15 NOTE — Group Note (Signed)
Date:  08/15/2023 Time:  10:04 AM  Group Topic/Focus:  Dimensions of Wellness:   The focus of this group is to introduce the topic of wellness and discuss the role each dimension of wellness plays in total health. Self Care:   The focus of this group is to help patients understand the importance of self-care in order to improve or restore emotional, physical, spiritual, interpersonal, and financial health.    Participation Level:  Did Not Attend   Crystal Black 08/15/2023, 10:04 AM
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
## 2023-08-15 NOTE — Plan of Care (Signed)
  Problem: Education: Goal: Emotional status will improve Outcome: Not Progressing Goal: Mental status will improve Outcome: Progressing Goal: Verbalization of understanding the information provided will improve Outcome: Progressing   Problem: Activity: Goal: Sleeping patterns will improve Outcome: Progressing   Problem: Safety: Goal: Periods of time without injury will increase Outcome: Progressing
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
## 2023-08-15 NOTE — Group Note (Signed)
Recreation Therapy Group Note   Group Topic:Goal Setting  Group Date: 08/15/2023 Start Time: 1000 End Time: 1100 Facilitators: Rosina Lowenstein, LRT, CTRS Location:  Craft Room  Group Description: Product/process development scientist. Patients were given many different magazines, a glue stick, markers, and a piece of cardstock paper. LRT and pts discussed the importance of having goals in life. LRT and pts discussed the difference between short-term and long-term goals, as well as what a SMART goal is. LRT encouraged pts to create a vision board, with images they picked and then cut out with safety scissors from the magazine, for themselves, that capture their short and long-term goals. LRT encouraged pts to show and explain their vision board to the group.   Goal Area(s) Addressed:  Patient will gain knowledge of short vs. long term goals.  Patient will identify goals for themselves. Patient will practice setting SMART goals. Patient will verbalize their goals to LRT and peers.   Affect/Mood: N/A   Participation Level: Did not attend    Clinical Observations/Individualized Feedback: Patient did not attend group.   Plan: Continue to engage patient in RT group sessions 2-3x/week.   Rosina Lowenstein, LRT, CTRS 08/15/2023 12:13 PM

## 2023-08-15 NOTE — Inpatient Diabetes Management (Signed)
Inpatient Diabetes Program Recommendations  AACE/ADA: New Consensus Statement on Inpatient Glycemic Control   Target Ranges:  Prepandial:   less than 140 mg/dL      Peak postprandial:   less than 180 mg/dL (1-2 hours)      Critically ill patients:  140 - 180 mg/dL    Latest Reference Range & Units 08/13/23 07:54 08/13/23 11:56 08/13/23 16:16 08/13/23 20:52 08/14/23 10:47 08/14/23 14:47  Glucose-Capillary 70 - 99 mg/dL 161 (H) 096 (H) 045 (H) 353 (H) 114 (H) 247 (H)    Latest Reference Range & Units 03/27/19 16:28 12/02/20 14:22 07/08/23 12:37  Hemoglobin A1C 4.8 - 5.6 % 7.9 (H) 5.1 6.2 (H)   Review of Glycemic Control     Current orders for Inpatient glycemic control: Metformin 500 mg QAM   Inpatient Diabetes Program Recommendations:     DM medications: Please consider increasing Metformin to 500 mg BID and order CBGs AC&HS and Novolog 0-9 units TID with meals.    Diet: Would recommend discontinuing Regular diet and ordering Carb Modified diet.Marland Kitchen  NOTE: Patient admitted to [facility] with major depressive disorder and suicidal ideation. Per chart, patient was dx with DM2 in September 2020 and seen Dr. Gershon Crane (Endocrinologist) on 05/28/2019 and patient was prescribed Metformin 500 mg BID at that time. Per chart review, no follow up with Endocrinology since then. Patient was started on Abilify during this admission and glucose has been more elevated. Diabetes coordinator consulted for CGM. Patient is not appropriate for CGM given currently admitted in [facility] and only ordered oral DM medication. Would recommend for patient to follow up with PCP regarding DM control and discuss CGM with PCP if patient interested in using CGM.   Thanks, Orlando Penner, RN, MSN, CDCES Diabetes Coordinator Inpatient Diabetes Program [phone] (Team Pager from 8am to 5pm)

## 2023-08-15 NOTE — Plan of Care (Signed)
Patient visible in the milieu. Patient had Zofran x 1 for nausea. Patient stated that she is little anxious about tomorrows interview. Patient denies SI,HI and AVH. Compliant with medications. Support and encouragement given.

## 2023-08-15 NOTE — Group Note (Signed)
[facility] LCSW Group Therapy Note   Group Date: 08/15/2023 Start Time: 1300 End Time: 1400   Type of Therapy/Topic:  Group Therapy:  Emotion Regulation  Participation Level:  Active   Mood:  Description of Group:    The purpose of this group is to assist patients in learning to regulate negative emotions and experience positive emotions. Patients will be guided to discuss ways in which they have been vulnerable to their negative emotions. These vulnerabilities will be juxtaposed with experiences of positive emotions or situations, and patients challenged to use positive emotions to combat negative ones. Special emphasis will be placed on coping with negative emotions in conflict situations, and patients will process healthy conflict resolution skills.  Therapeutic Goals: Patient will identify two positive emotions or experiences to reflect on in order to balance out negative emotions:  Patient will label two or more emotions that they find the most difficult to experience:  Patient will be able to demonstrate positive conflict resolution skills through discussion or role plays:   Summary of Patient Progress: Patient was present in group.  Patient was active and supportive of other group members.  Patient reports that she is learning to navigate her intense emotions by utilizing her toolkit of coping skills. Patient reports having a list of over 50 coping skills that she is adding to daily. She was appropriate in group and displayed fair insight.     Therapeutic Modalities:   Cognitive Behavioral Therapy Feelings Identification Dialectical Behavioral Therapy   Lowry Ram, LCSW

## 2023-08-15 NOTE — Group Note (Signed)
Date:  08/15/2023 Time:  3:21 PM  Group Topic/Focus:  Activity Group: The focus of the group is to promote activity for the patients and to encourage them to go outside to the courtyard to get some fresh air and some exercise.    Participation Level:  Active  Participation Quality:  Appropriate  Affect:  Appropriate  Cognitive:  Appropriate  Insight: Appropriate  Engagement in Group:  Engaged  Modes of Intervention:  Activity  Additional Comments:    [name] 08/15/2023, 3:21 PM

## 2023-08-15 NOTE — Group Note (Signed)
Date:  08/15/2023 Time:  10:31 PM  Group Topic/Focus:  Wrap-Up Group:   The focus of this group is to help patients review their daily goal of treatment and discuss progress on daily workbooks.    Participation Level:  Active  Participation Quality:  Appropriate and Attentive  Affect:  Appropriate  Cognitive:  Alert  Insight: Appropriate  Engagement in Group:  Engaged and Supportive  Modes of Intervention:  Discussion and Orientation  Additional Comments:     Maglione,[name] 08/15/2023, 10:31 PM

## 2023-08-16 DIAGNOSIS — F3160 Bipolar disorder, current episode mixed, unspecified: Secondary | ICD-10-CM | POA: Diagnosis not present

## 2023-08-16 LAB — GLUCOSE, CAPILLARY
Glucose-Capillary: 215 mg/dL — ABNORMAL HIGH (ref 70–99)
Glucose-Capillary: 272 mg/dL — ABNORMAL HIGH (ref 70–99)
Glucose-Capillary: 129 mg/dL — ABNORMAL HIGH (ref 70–99)

## 2023-08-16 MED ORDER — METFORMIN HCL 500 MG PO TABS
500.0000 mg | ORAL_TABLET | Freq: Two times a day (BID) | ORAL | Status: DC
  Administered 2023-08-16 – 2023-09-08 (×46): 500 mg via ORAL
  Filled 2023-08-16 (×46): qty 1

## 2023-08-16 NOTE — BHH Counselor (Signed)
CSW sat in with the patient as an interview was conducted for placement.  Attendees: [place] Pink, [employer] owner Ardyth Man, guardian [place], [facility] staff [place], [facility] Care Manager Kathryne Eriksson, [place] CNO  The meeting was held at 10:30 AM.  During the meeting the patient was able to discuss with Symone the things that she likes to do.  AFL owner was able to discuss her home, neighborhood, rules and expectations.  Home is located in [place], [place].   The AFL owner and patient were able to discuss how they handle and address conflicts individually and were able to troubleshoot how they could work this out in the future should the patient be accepted.    CSW notes concerns that the patient's guardian was somewhat negative in the meeting.  Guardian pointed out patient's faults and negative behaviors without a positive re-frame.  CSW interjected to discuss how the patient has had negative behaviors and outbursts on the unit, however, pt was evidenced to be able to reframe and redirect the conversation.  Pt was appreciative and thanked this Clinical research associate.    CSW notes that patient was nervous and anxious at beginning of interview as evidenced by rapid speech, fidgeting and pt report.  CSW was able to assist patient in deescalation.   [name], MSW, LCSW 08/16/2023 12:48 PM

## 2023-08-16 NOTE — Group Note (Signed)
Recreation Therapy Group Note   Group Topic:Other  Group Date: 08/16/2023 Start Time: 1000 End Time: 1050 Facilitators: Clinton Gallant, CTRS Location:  C[facility]  Activity Description/Intervention: Therapeutic Drumming. Patients with peers and staff were given the opportunity to engage in a leader facilitated [facility] Group Empowerment Drumming Circle with staff from the [employer], in partnership with [employer]. Teaching laboratory technician and trained [employer], Theodoro Doing leading with LRT observing and documenting intervention and pt response. This evidenced-based practice targets 7 areas of health and wellbeing in the human experience including: stress-reduction, exercise, self-expression, camaraderie/support, nurturing, spirituality, and music-making (leisure).    Goal Area(s) Addresses:  Patient will engage in pro-social way in music group.  Patient will follow directions of drum leader on the first prompt. Patient will demonstrate no behavioral issues during group.  Patient will identify if a reduction in stress level occurs as a result of participation in therapeutic drum circle.    Affect/Mood: N/A   Participation Level: Did not attend    Clinical Observations/Individualized Feedback: Patient did not attend group.   Plan: Continue to engage patient in RT group sessions 2-3x/week.   Rosina Lowenstein, LRT, CTRS 08/16/2023 11:55 AM

## 2023-08-16 NOTE — Inpatient Diabetes Management (Signed)
Inpatient Diabetes Program Recommendations  AACE/ADA: New Consensus Statement on Inpatient Glycemic Control  Target Ranges:  Prepandial:   less than 140 mg/dL      Peak postprandial:   less than 180 mg/dL (1-2 hours)      Critically ill patients:  140 - 180 mg/dL     Review of Glycemic Control  DM history: DM2 Outpatient DM meds: None Current orders for Inpatient glycemic control: Metformin 500 mg QAM    Inpatient Diabetes Program Recommendations:     DM medications: Please consider increasing Metformin to 500 mg BID. May want to consider ordering CBGs AC&HS and Novolog 0-9 units TID with meals.   NOTE: Patient admitted to [facility] with major depressive disorder and suicidal ideation. Per chart, patient was dx with DM2 in September 2020 and seen Dr. Gershon Crane (Endocrinologist) on 05/28/2019 and patient was prescribed Metformin 500 mg BID at that time. Per chart review, no follow up with Endocrinology since then. Patient was started on Abilify during this admission and glucose has been more elevated. Diabetes coordinator consulted for CGM. Patient is not appropriate for CGM given currently admitted in [facility] and only ordered oral DM medication. Would recommend for patient to follow up with PCP regarding DM control and discuss CGM with PCP if patient interested in using CGM.  Signing off consult.  Thanks, Orlando Penner, RN, MSN, CDCES Diabetes Coordinator Inpatient Diabetes Program [phone] (Team Pager from 8am to 5pm)

## 2023-08-16 NOTE — Plan of Care (Signed)
Patient stated that she did good at the interview. Patient happy about that. Denies SI,HI and AVH. Appropriate with staff & peers. Compliant with medications. Appetite and energy level good. Support and encouragement given.
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
## 2023-08-16 NOTE — Progress Notes (Signed)
   08/16/23 0100  Psych Admission Type (Psych Patients Only)  Admission Status Voluntary  Psychosocial Assessment  Patient Complaints None  Eye Contact Fair  Facial Expression Angry  Affect Appropriate to circumstance  Speech Logical/coherent  Interaction Assertive  Motor Activity Slow  Appearance/Hygiene Unremarkable  Behavior Characteristics Cooperative  Mood Pleasant  Aggressive Behavior  Effect No apparent injury  Thought Process  Coherency WDL  Content WDL  Delusions None reported or observed  Perception WDL  Hallucination None reported or observed  Judgment Impaired  Danger to Self  Current suicidal ideation?  (denies)  Agreement Not to Harm Self Yes  Description of Agreement verbal  Danger to Others  Danger to Others None reported or observed  Danger to Others Abnormal  Harmful Behavior to others Threats of violence towards other people observed or expressed   Destructive Behavior No threats or harm toward property

## 2023-08-16 NOTE — Group Note (Signed)
[facility] LCSW Group Therapy Note   Group Date: 08/16/2023 Start Time: 1300 End Time: 1320  Type of Therapy and Topic:  Group Therapy:  Feelings around Relapse and Recovery  Participation Level:  Did Not Attend    Description of Group:    Patients in this group will discuss emotions they experience before and after a relapse. They will process how experiencing these feelings, or avoidance of experiencing them, relates to having a relapse. Facilitator will guide patients to explore emotions they have related to recovery. Patients will be encouraged to process which emotions are more powerful. They will be guided to discuss the emotional reaction significant others in their lives may have to patients' relapse or recovery. Patients will be assisted in exploring ways to respond to the emotions of others without this contributing to a relapse.  Therapeutic Goals: Patient will identify two or more emotions that lead to relapse for them:  Patient will identify two emotions that result when they relapse:  Patient will identify two emotions related to recovery:  Patient will demonstrate ability to communicate their needs through discussion and/or role plays.   Summary of Patient Progress: X   Therapeutic Modalities:   Cognitive Behavioral Therapy Solution-Focused Therapy Assertiveness Training Relapse Prevention Therapy   Glenis Smoker, LCSW

## 2023-08-16 NOTE — Plan of Care (Signed)
   Problem: Education: Goal: Knowledge of [place] [facility] Education information/materials will improve Outcome: Progressing Goal: Emotional status will improve Outcome: Progressing

## 2023-08-17 DIAGNOSIS — F3160 Bipolar disorder, current episode mixed, unspecified: Secondary | ICD-10-CM | POA: Diagnosis not present

## 2023-08-17 LAB — GLUCOSE, CAPILLARY
Glucose-Capillary: 206 mg/dL — ABNORMAL HIGH (ref 70–99)
Glucose-Capillary: 169 mg/dL — ABNORMAL HIGH (ref 70–99)
Glucose-Capillary: 113 mg/dL — ABNORMAL HIGH (ref 70–99)
Glucose-Capillary: 155 mg/dL — ABNORMAL HIGH (ref 70–99)

## 2023-08-17 MED ORDER — PRAZOSIN HCL 2 MG PO CAPS
10.0000 mg | ORAL_CAPSULE | Freq: Every day | ORAL | Status: DC
  Administered 2023-08-17 – 2023-09-06 (×18): 10 mg via ORAL
  Filled 2023-08-17 (×21): qty 5
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
## 2023-08-17 NOTE — Group Note (Signed)
Date:  08/17/2023 Time:  4:56 AM  Group Topic/Focus:  Building Self Esteem:   The Focus of this group is helping patients become aware of the effects of self-esteem on their lives, the things they and others do that enhance or undermine their self-esteem, seeing the relationship between their level of self-esteem and the choices they make and learning ways to enhance self-esteem.    Participation Level:  Active  Participation Quality:  Appropriate and Drowsy  Affect:  Appropriate  Cognitive:  Alert and Appropriate  Insight: Appropriate and Good  Engagement in Group:  Developing/Improving and Engaged  Modes of Intervention:  Clarification, Discussion, Rapport Building, and Support  Additional Comments:     Noemy Hallmon 08/17/2023, 4:56 AM

## 2023-08-17 NOTE — Group Note (Signed)
Date:  08/17/2023 Time:  4:41 PM  Group Topic/Focus:  Activity Group: The focus of the group is to promote activity for the patients and encourage them to go outside to the courtyard and get some fresh air and some exercise.    Participation Level:  Active  Participation Quality:  Appropriate  Affect:  Appropriate  Cognitive:  Appropriate  Insight: Appropriate  Engagement in Group:  Engaged  Modes of Intervention:  Activity  Additional Comments:    Mary Sella Edwin Cherian 08/17/2023, 4:41 PM

## 2023-08-17 NOTE — Progress Notes (Signed)
1:1 Patient Monitoring (while awake)  0815: Patient is in the dayroom, eating breakfast.  0915: Patient is in her bedroom, talking to her safety sitter.  1215: Patient is in the dayroom, eating lunch.  1515: Patient   1615: Patient is outside, in the courtyard with staff and other members on the unit.  1715: Patient is in her room, playing with cards.  1815: Patient is sitting in the hallway, journaling, shaking, and writing that she is going to kill someone. Patient received PRN medications.
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
## 2023-08-17 NOTE — Plan of Care (Signed)
 ?  Problem: Education: ?Goal: Knowledge of [place] [place] [facility] information/materials will improve ?Outcome: Progressing ?Goal: Emotional status will improve ?Outcome: Progressing ?  ?Problem: Activity: ?Goal: Interest or engagement in activities will improve ?Outcome: Progressing ?  ?

## 2023-08-17 NOTE — [facility] Counselor (Signed)
CSW contacted the patients guardian.  CSW followed up on meeting with AFL yesterday.  Guardian reports that she has not heard anything as of yet.  She reports that once she is aware she will follow up with this Clinical research associate.  [name], MSW, LCSW 08/17/2023 1:25 PM

## 2023-08-17 NOTE — Plan of Care (Signed)
Problem: Education: Goal: Knowledge of [facility] Education information/materials will improve 08/17/2023 1854 by Doyce Para, RN Outcome: Not Progressing 08/17/2023 1854 by Doyce Para, RN Outcome: Progressing Goal: Emotional status will improve 08/17/2023 1854 by Doyce Para, RN Outcome: Not Progressing 08/17/2023 1854 by Doyce Para, RN Outcome: Progressing Goal: Mental status will improve 08/17/2023 1854 by Doyce Para, RN Outcome: Not Progressing 08/17/2023 1854 by Doyce Para, RN Outcome: Progressing Goal: Verbalization of understanding the information provided will improve 08/17/2023 1854 by Doyce Para, RN Outcome: Not Progressing 08/17/2023 1854 by Doyce Para, RN Outcome: Progressing   Problem: Activity: Goal: Interest or engagement in activities will improve 08/17/2023 1854 by Doyce Para, RN Outcome: Not Progressing 08/17/2023 1854 by Doyce Para, RN Outcome: Progressing Goal: Sleeping patterns will improve 08/17/2023 1854 by Doyce Para, RN Outcome: Not Progressing 08/17/2023 1854 by Doyce Para, RN Outcome: Progressing   Problem: Coping: Goal: Ability to verbalize frustrations and anger appropriately will improve 08/17/2023 1854 by Doyce Para, RN Outcome: Not Progressing 08/17/2023 1854 by Doyce Para, RN Outcome: Progressing Goal: Ability to demonstrate self-control will improve 08/17/2023 1854 by Doyce Para, RN Outcome: Not Progressing 08/17/2023 1854 by Doyce Para, RN Outcome: Progressing   Problem: Health Behavior/Discharge Planning: Goal: Identification of resources available to assist in meeting health care needs will improve 08/17/2023 1854 by Doyce Para, RN Outcome: Not Progressing 08/17/2023 1854 by Doyce Para, RN Outcome: Progressing Goal: Compliance with treatment plan for underlying cause of condition will  improve 08/17/2023 1854 by Doyce Para, RN Outcome: Not Progressing 08/17/2023 1854 by Doyce Para, RN Outcome: Progressing   Problem: Physical Regulation: Goal: Ability to maintain clinical measurements within normal limits will improve 08/17/2023 1854 by Doyce Para, RN Outcome: Not Progressing 08/17/2023 1854 by Doyce Para, RN Outcome: Progressing   Problem: Safety: Goal: Periods of time without injury will increase 08/17/2023 1854 by Doyce Para, RN Outcome: Not Progressing 08/17/2023 1854 by Doyce Para, RN Outcome: Progressing   Problem: Education: Goal: Utilization of techniques to improve thought processes will improve 08/17/2023 1854 by Doyce Para, RN Outcome: Not Progressing 08/17/2023 1854 by Doyce Para, RN Outcome: Progressing Goal: Knowledge of the prescribed therapeutic regimen will improve 08/17/2023 1854 by Doyce Para, RN Outcome: Not Progressing 08/17/2023 1854 by Doyce Para, RN Outcome: Progressing   Problem: Activity: Goal: Interest or engagement in leisure activities will improve 08/17/2023 1854 by Doyce Para, RN Outcome: Not Progressing 08/17/2023 1854 by Doyce Para, RN Outcome: Progressing Goal: Imbalance in normal sleep/wake cycle will improve 08/17/2023 1854 by Doyce Para, RN Outcome: Not Progressing 08/17/2023 1854 by Doyce Para, RN Outcome: Progressing   Problem: Coping: Goal: Coping ability will improve 08/17/2023 1854 by Doyce Para, RN Outcome: Not Progressing 08/17/2023 1854 by Doyce Para, RN Outcome: Progressing Goal: Will verbalize feelings 08/17/2023 1854 by Doyce Para, RN Outcome: Not Progressing 08/17/2023 1854 by Doyce Para, RN Outcome: Progressing   Problem: Health Behavior/Discharge Planning: Goal: Ability to make decisions will improve 08/17/2023 1854 by Doyce Para, RN Outcome: Not  Progressing 08/17/2023 1854 by Doyce Para, RN Outcome: Progressing Goal: Compliance with therapeutic regimen will improve 08/17/2023 1854 by Doyce Para, RN Outcome: Not Progressing 08/17/2023 1854 by Doyce Para, RN Outcome: Progressing   Problem: Role Relationship: Goal: Will demonstrate positive changes in social behaviors and relationships 08/17/2023 1854 by Doyce Para, RN Outcome: Not Progressing 08/17/2023 1854 by Doyce Para, RN Outcome: Progressing   Problem: Safety: Goal: Ability to disclose and discuss suicidal ideas will improve 08/17/2023 1854 by Doyce Para, RN  Outcome: Not Progressing 08/17/2023 1854 by Doyce Para, RN Outcome: Progressing Goal: Ability to identify and utilize support systems that promote safety will improve 08/17/2023 1854 by Doyce Para, RN Outcome: Not Progressing 08/17/2023 1854 by Doyce Para, RN Outcome: Progressing   Problem: Self-Concept: Goal: Will verbalize positive feelings about self 08/17/2023 1854 by Doyce Para, RN Outcome: Not Progressing 08/17/2023 1854 by Doyce Para, RN Outcome: Progressing Goal: Level of anxiety will decrease 08/17/2023 1854 by Doyce Para, RN Outcome: Not Progressing 08/17/2023 1854 by Doyce Para, RN Outcome: Progressing   Problem: Education: Goal: Knowledge of General Education information will improve Description: Including pain rating scale, medication(s)/side effects and non-pharmacologic comfort measures 08/17/2023 1854 by Doyce Para, RN Outcome: Not Progressing 08/17/2023 1854 by Doyce Para, RN Outcome: Progressing   Problem: Health Behavior/Discharge Planning: Goal: Ability to manage health-related needs will improve 08/17/2023 1854 by Doyce Para, RN Outcome: Not Progressing 08/17/2023 1854 by Doyce Para, RN Outcome: Progressing

## 2023-08-17 NOTE — Progress Notes (Signed)
   08/17/23 0845  Psych Admission Type (Psych Patients Only)  Admission Status Voluntary  Psychosocial Assessment  Patient Complaints None  Eye Contact Fair  Facial Expression Other (Comment) (appropriate)  Affect Appropriate to circumstance  Speech Logical/coherent  Interaction Assertive  Motor Activity Slow  Appearance/Hygiene Unremarkable  Behavior Characteristics Cooperative;Appropriate to situation  Mood Pleasant  Aggressive Behavior  Effect No apparent injury  Thought Process  Coherency WDL  Content WDL  Delusions None reported or observed  Perception WDL  Hallucination None reported or observed  Judgment Limited  Confusion None  Danger to Self  Current suicidal ideation? Denies  Self-Injurious Behavior No self-injurious ideation or behavior indicators observed or expressed   Agreement Not to Harm Self Yes  Description of Agreement Verbal  Danger to Others  Danger to Others None reported or observed  Danger to Others Abnormal  Harmful Behavior to others No threats or harm toward other people  Destructive Behavior No threats or harm toward property

## 2023-08-17 NOTE — Progress Notes (Signed)
   08/17/23 0501  Psych Admission Type (Psych Patients Only)  Admission Status Voluntary  Psychosocial Assessment  Patient Complaints Anxiety  Eye Contact Fair  Facial Expression Anxious;Fixed smile  Affect Appropriate to circumstance  Speech Logical/coherent  Interaction Assertive  Motor Activity Slow  Appearance/Hygiene Unremarkable  Behavior Characteristics Cooperative  Aggressive Behavior  Effect No apparent injury  Thought Process  Coherency WDL  Content WDL  Delusions None reported or observed  Perception WDL  Hallucination None reported or observed  Judgment Impaired  Danger to Self  Current suicidal ideation?  (denies)  Agreement Not to Harm Self Yes  Description of Agreement verbal  Danger to Others  Danger to Others None reported or observed  Danger to Others Abnormal  Harmful Behavior to others Threats of violence towards other people observed or expressed   Destructive Behavior No threats or harm toward property

## 2023-08-17 NOTE — Group Note (Signed)
LCSW Group Therapy Note  Group Date: 08/17/2023 Start Time: 1330 End Time: 1445   Type of Therapy and Topic:  Group Therapy: Anger Cues and Responses  Participation Level:  Did Not Attend   Description of Group:   In this group, patients learned how to recognize the physical, cognitive, emotional, and behavioral responses they have to anger-provoking situations.  They identified a recent time they became angry and how they reacted.  They analyzed how their reaction was possibly beneficial and how it was possibly unhelpful.  The group discussed a variety of healthier coping skills that could help with such a situation in the future.  Focus was placed on how helpful it is to recognize the underlying emotions to our anger, because working on those can lead to a more permanent solution as well as our ability to focus on the important rather than the urgent.  Therapeutic Goals: Patients will remember their last incident of anger and how they felt emotionally and physically, what their thoughts were at the time, and how they behaved. Patients will identify how their behavior at that time worked for them, as well as how it worked against them. Patients will explore possible new behaviors to use in future anger situations. Patients will learn that anger itself is normal and cannot be eliminated, and that healthier reactions can assist with resolving conflict rather than worsening situations.  Summary of Patient Progress:   Patient declined attend group.   Therapeutic Modalities:   Cognitive Behavioral Therapy    Harden Mo, LCSW 08/17/2023  3:10 PM

## 2023-08-18 DIAGNOSIS — F3160 Bipolar disorder, current episode mixed, unspecified: Secondary | ICD-10-CM | POA: Diagnosis not present

## 2023-08-18 LAB — GLUCOSE, CAPILLARY
Glucose-Capillary: 138 mg/dL — ABNORMAL HIGH (ref 70–99)
Glucose-Capillary: 106 mg/dL — ABNORMAL HIGH (ref 70–99)
Glucose-Capillary: 134 mg/dL — ABNORMAL HIGH (ref 70–99)

## 2023-08-18 MED ORDER — CLONAZEPAM 0.25 MG PO TBDP: 0.2500 mg | ORAL_TABLET | Freq: Two times a day (BID) | ORAL | Status: DC

## 2023-08-18 MED ORDER — CLONAZEPAM 0.25 MG PO TBDP
1.0000 mg | ORAL_TABLET | Freq: Every day | ORAL | Status: AC
  Administered 2023-08-18 – 2023-08-19 (×2): 1 mg via ORAL
  Filled 2023-08-18 (×2): qty 4

## 2023-08-18 MED ORDER — CLONAZEPAM 0.25 MG PO TBDP
0.2500 mg | ORAL_TABLET | Freq: Every day | ORAL | Status: DC
  Administered 2023-08-20 – 2023-09-08 (×20): 0.25 mg via ORAL
  Filled 2023-08-18 (×20): qty 1

## 2023-08-18 NOTE — Group Note (Addendum)
Date:  08/18/2023 Time:  10:25 PM  Group Topic/Focus:  Developing a Wellness Toolbox:   The focus of this group is to help patients develop a "wellness toolbox" with skills and strategies to promote recovery upon discharge.    Participation Level:  Active  Participation Quality:  Appropriate  Affect:  Flat  Cognitive:  Appropriate  Insight: Appropriate  Engagement in Group:  None  Modes of Intervention:  Discussion  Additional Comments:  Patient presented outside of normal limits and patient affect was flat. Patient was involved in group dissuasion, however, not as alert as prior group sessions. Patient completed all required paperwork.  Lorenda Ishihara 08/18/2023, 10:25 PM

## 2023-08-18 NOTE — Progress Notes (Signed)
   08/18/23 1700  Psych Admission Type (Psych Patients Only)  Admission Status Voluntary  Psychosocial Assessment  Patient Complaints None  Eye Contact Fair  Facial Expression Other (Comment) (appropriate)  Affect Appropriate to circumstance  Speech Logical/coherent  Interaction Assertive  Motor Activity Slow  Appearance/Hygiene Unremarkable  Behavior Characteristics Cooperative;Appropriate to situation  Mood Pleasant (patient's goal for today is "to be better every day", in which she will "don't let anyone put me down", in order to achieve her goal.)  Aggressive Behavior  Effect No apparent injury  Thought Process  Coherency WDL  Content WDL  Delusions None reported or observed  Perception WDL  Hallucination None reported or observed  Judgment Limited  Confusion None  Danger to Self  Current suicidal ideation? Denies  Self-Injurious Behavior No self-injurious ideation or behavior indicators observed or expressed   Agreement Not to Harm Self Yes  Description of Agreement Verbal  Danger to Others  Danger to Others None reported or observed  Danger to Others Abnormal  Harmful Behavior to others No threats or harm toward other people  Destructive Behavior No threats or harm toward property
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
## 2023-08-18 NOTE — Plan of Care (Signed)
  Problem: Education: Goal: Knowledge of [facility] [place] [facility] Education information/materials will improve Outcome: Progressing Goal: Emotional status will improve Outcome: Progressing Goal: Mental status will improve Outcome: Progressing Goal: Verbalization of understanding the information provided will improve Outcome: Progressing   Problem: Activity: Goal: Interest or engagement in activities will improve Outcome: Progressing Goal: Sleeping patterns will improve Outcome: Progressing   Problem: Coping: Goal: Ability to verbalize frustrations and anger appropriately will improve Outcome: Progressing Goal: Ability to demonstrate self-control will improve Outcome: Progressing   Problem: Health Behavior/Discharge Planning: Goal: Identification of resources available to assist in meeting health care needs will improve Outcome: Progressing Goal: Compliance with treatment plan for underlying cause of condition will improve Outcome: Progressing   Problem: Physical Regulation: Goal: Ability to maintain clinical measurements within normal limits will improve Outcome: Progressing   Problem: Safety: Goal: Periods of time without injury will increase Outcome: Progressing   Problem: Education: Goal: Utilization of techniques to improve thought processes will improve Outcome: Progressing Goal: Knowledge of the prescribed therapeutic regimen will improve Outcome: Progressing   Problem: Activity: Goal: Interest or engagement in leisure activities will improve Outcome: Progressing Goal: Imbalance in normal sleep/wake cycle will improve Outcome: Progressing   Problem: Coping: Goal: Coping ability will improve Outcome: Progressing Goal: Will verbalize feelings Outcome: Progressing   Problem: Health Behavior/Discharge Planning: Goal: Ability to make decisions will improve Outcome: Progressing Goal: Compliance with therapeutic regimen will improve Outcome: Progressing    Problem: Role Relationship: Goal: Will demonstrate positive changes in social behaviors and relationships Outcome: Progressing   Problem: Safety: Goal: Ability to disclose and discuss suicidal ideas will improve Outcome: Progressing Goal: Ability to identify and utilize support systems that promote safety will improve Outcome: Progressing   Problem: Self-Concept: Goal: Will verbalize positive feelings about self Outcome: Progressing Goal: Level of anxiety will decrease Outcome: Progressing   Problem: Education: Goal: Knowledge of General Education information will improve Description: Including pain rating scale, medication(s)/side effects and non-pharmacologic comfort measures Outcome: Progressing   Problem: Health Behavior/Discharge Planning: Goal: Ability to manage health-related needs will improve Outcome: Progressing

## 2023-08-18 NOTE — Group Note (Signed)
Date:  08/18/2023 Time:  10:18 AM  Group Topic/Focus:  Goals Group:   The focus of this group is to help patients establish daily goals to achieve during treatment and discuss how the patient can incorporate goal setting into their daily lives to aide in recovery.    Participation Level:  Did Not Attend   Lynelle Smoke [facility] 08/18/2023, 10:18 AM

## 2023-08-18 NOTE — Progress Notes (Signed)
1:1 Patient Monitoring (while awake)  1000: Patient is in her room, lying down.  1100: Patient is in the dayroom, playing cards with another member on the unit.  1200: Patient is in the dayroom, eating lunch.  1415: Patient is in her room, lying down.  1515: Patient is in the dayroom, talking to another female patient on the unit.  1615: Patient is in the dayroom, eating dinner.  1715: Patient is in the dayroom, talking to other members on the unit.  1815: Patient is in her room, gathering some belongings, to take back to the dayroom.

## 2023-08-18 NOTE — B[facility] Counselor (Signed)
CSW received email from guardian that [facility] with [facility] has ACCEPTED the patient.    Guardian reports that this will remain a lengthy process for transition.  Patient unaware at this time.   [name], MSW, LCSW 08/18/2023 2:50 PM

## 2023-08-18 NOTE — BH IP Treatment Plan (Signed)
Interdisciplinary Treatment and Diagnostic Plan Update  08/18/2023 Time of Session: 9:00AM Crystal Black MRN: [id number]  Principal Diagnosis: Bipolar I disorder, most recent episode mixed (HCC)  Secondary Diagnoses: Principal Problem:   Bipolar I disorder, most recent episode mixed (HCC) Active Problems:   PTSD (post-traumatic stress disorder)   HIV disease (HCC)   Thrombosed hemorrhoids   Abdominal pain   Current Medications:  Current Facility-Administered Medications  Medication Dose Route Frequency Provider Last Rate Last Admin   albuterol (VENTOLIN HFA) 108 (90 Base) MCG/ACT inhaler 2 puff  2 puff Inhalation Q4H PRN [name], NP   2 puff at 08/13/23 1543   alum & mag hydroxide-simeth (MAALOX/MYLANTA) 200-200-20 MG/5ML suspension 30 mL  30 mL Oral Q4H PRN McLauchlin, [name], NP   30 mL at 08/02/23 2036   ARIPiprazole ER (ABILIFY MAINTENA) injection 300 mg  300 mg Intramuscular Q28 days [name], NP   300 mg at 08/07/23 1009   [START ON 08/20/2023] clonazePAM (KLONOPIN) disintegrating tablet 0.25 mg  0.25 mg Oral BID [name], NP       clonazePAM Crystal Black) disintegrating tablet 1 mg  1 mg Oral Daily [name], NP       darunavir-cobicistat (PREZCOBIX) 800-150 MG per tablet 1 tablet  1 tablet Oral Q breakfast [name], NP   1 tablet at 08/18/23 1018   haloperidol (HALDOL) tablet 5 mg  5 mg Oral TID PRN McLauchlin, [name], NP       And   diphenhydrAMINE (BENADRYL) capsule 50 mg  50 mg Oral TID PRN McLauchlin, [name], NP       haloperidol (HALDOL) tablet 5 mg  5 mg Oral TID PRN [name], NP   5 mg at 08/14/23 1942   And   diphenhydrAMINE (BENADRYL) capsule 50 mg  50 mg Oral TID PRN [name], NP   50 mg at 08/14/23 1942   haloperidol lactate (HALDOL) injection 5 mg  5 mg Intramuscular TID PRN McLauchlin, [name], NP       And   diphenhydrAMINE (BENADRYL) injection 50 mg  50 mg Intramuscular TID PRN McLauchlin, [name], NP   50 mg at 08/04/23  1412   And   LORazepam (ATIVAN) injection 2 mg  2 mg Intramuscular TID PRN McLauchlin, [name], NP   2 mg at 08/04/23 1412   haloperidol lactate (HALDOL) injection 10 mg  10 mg Intramuscular TID PRN McLauchlin, [name], NP       And   diphenhydrAMINE (BENADRYL) injection 50 mg  50 mg Intramuscular TID PRN McLauchlin, [name], NP       And   LORazepam (ATIVAN) injection 2 mg  2 mg Intramuscular TID PRN McLauchlin, [name], NP       haloperidol lactate (HALDOL) injection 5 mg  5 mg Intramuscular TID PRN [name], NP       And   diphenhydrAMINE (BENADRYL) injection 50 mg  50 mg Intramuscular TID PRN [name], NP       And   LORazepam (ATIVAN) injection 2 mg  2 mg Intramuscular TID PRN [name], NP       haloperidol lactate (HALDOL) injection 10 mg  10 mg Intramuscular TID PRN [name], NP   10 mg at 08/17/23 1800   And   diphenhydrAMINE (BENADRYL) injection 50 mg  50 mg Intramuscular TID PRN [name], NP   50 mg at 08/17/23 1800   And   LORazepam (ATIVAN)  injection 2 mg  2 mg Intramuscular TID PRN [name], NP   2 mg at 08/17/23 1801   divalproex (DEPAKOTE ER) 24 hr tablet 500 mg  500 mg Oral BID [name], NP   500 mg at 08/18/23 1019   docusate sodium (COLACE) capsule 200 mg  200 mg Oral Daily [name], [name], NP   200 mg at 08/18/23 1019   dolutegravir (TIVICAY) tablet 50 mg  50 mg Oral Daily [name], NP   50 mg at 08/18/23 1018   famotidine (PEPCID) tablet 20 mg  20 mg Oral Daily [name], NP   20 mg at 08/18/23 1019   feeding supplement (GLUCERNA SHAKE) (GLUCERNA SHAKE) liquid 237 mL  237 mL Oral TID WC [name], NP       FLUoxetine (PROZAC) capsule 20 mg  20 mg Oral Daily [name], NP   20 mg at 08/18/23 1019   hydrocortisone (ANUSOL-HC) 2.5 % rectal cream   Rectal BID [name], MD   Given at 08/11/23 1656   hydrOXYzine (ATARAX) tablet 50 mg  50 mg Oral Q6H PRN [name], NP   50 mg at  08/17/23 0856   ibuprofen (ADVIL) tablet 600 mg  600 mg Oral Q8H PRN [name], NP   600 mg at 08/17/23 1233   influenza vac split trivalent PF (FLULAVAL) injection 0.5 mL  0.5 mL Intramuscular Tomorrow-1000 [name], MD       linaclotide [name]) capsule 290 mcg  290 mcg Oral QAC breakfast [name], NP   290 mcg at 08/18/23 1018   magnesium hydroxide (MILK OF MAGNESIA) suspension 30 mL  30 mL Oral Daily PRN McLauchlin, [name], NP   30 mL at 08/17/23 2109   melatonin tablet 5 mg  5 mg Oral QHS [name], NP   5 mg at 08/17/23 2107   menthol-cetylpyridinium (CEPACOL) lozenge 3 mg  1 lozenge Oral PRN Tingling, [name], [name]   3 mg at 08/16/23 1318   metFORMIN (GLUCOPHAGE) tablet 500 mg  500 mg Oral BID WC [name], NP   500 mg at 08/18/23 1019   methocarbamol (ROBAXIN) tablet 500 mg  500 mg Oral QHS [name], NP   500 mg at 08/17/23 2107   montelukast (SINGULAIR) tablet 5 mg  5 mg Oral QHS [name], [name], DO   5 mg at 08/17/23 2108   nicotine polacrilex (NICORETTE) gum 2 mg  2 mg Oral PRN [name], NP   2 mg at 08/18/23 1017   ondansetron (ZOFRAN) tablet 4 mg  4 mg Oral Q8H PRN [name], NP   4 mg at 08/17/23 1233   polyethylene glycol (MIRALAX / GLYCOLAX) packet 17 g  17 g Oral Daily [name], [name], NP   17 g at 08/18/23 1017   prazosin (MINIPRESS) capsule 10 mg  10 mg Oral QHS [name], NP   10 mg at 08/17/23 2107   traZODone (DESYREL) tablet 50 mg  50 mg Oral QHS PRN McLauchlin, [name], NP   50 mg at 08/16/23 2137   PTA Medications: Medications Prior to Admission  Medication Sig Dispense Refill Last Dose/Taking   albuterol (VENTOLIN HFA) 108 (90 Base) MCG/ACT inhaler Inhale 2 puffs into the lungs every 4 (four) hours as needed for wheezing or shortness of breath.   Taking As Needed   ARIPiprazole (ABILIFY) 2 MG tablet Take 1 tablet (2  mg total) by mouth daily. 12 tablet 0 07/17/2023 at  8:00 AM   ARIPiprazole ER (ABILIFY MAINTENA) 400 MG  SRER injection Inject 1.5 mLs (300 mg total) into the muscle every 28 (twenty-eight) days. 1.5 mL 0 06/30/2023   benztropine (COGENTIN) 1 MG tablet Take 1 tablet (1 mg total) by mouth 2 (two) times daily. 60 tablet 0 07/17/2023 at  8:00 PM   darunavir-cobicistat (PREZCOBIX) 800-150 MG tablet Take 1 tablet by mouth daily with breakfast. Swallow whole. Do NOT crush, break or chew tablets. Take with food. 30 tablet 1 07/17/2023 at  8:00 AM   divalproex (DEPAKOTE ER) 250 MG 24 hr tablet Take 1 tablet (250 mg total) by mouth 2 (two) times daily. 60 tablet 0 07/17/2023 at  8:00 PM   docusate sodium (COLACE) 100 MG capsule Take 300 mg by mouth daily.   07/17/2023 at  8:00 AM   dolutegravir (TIVICAY) 50 MG tablet Take 1 tablet (50 mg total) by mouth daily. 30 tablet 1 07/17/2023 at  8:00 AM   EPINEPHrine 0.3 mg/0.3 mL IJ SOAJ injection Inject 0.3 mg into the muscle as needed for anaphylaxis.   Taking As Needed   famotidine (PEPCID) 20 MG tablet Take 20 mg by mouth 2 (two) times daily.   Taking   FIBER ADULT GUMMIES PO Take 1 tablet by mouth in the morning and at bedtime.   07/17/2023 at  8:00 PM   FLUoxetine (PROZAC) 40 MG capsule Take 40 mg by mouth at bedtime.   07/17/2023 at  8:00 PM   glycerin adult 2 g suppository Place 1 suppository rectally as needed for constipation.   Taking As Needed   hydrOXYzine (ATARAX) 25 MG tablet Take 1 tablet (25 mg total) by mouth 3 (three) times daily as needed. 60 tablet 1 Taking As Needed   ibuprofen (ADVIL) 800 MG tablet Take 1 tablet (800 mg total) by mouth every 8 (eight) hours as needed for moderate pain. 15 tablet 0 Taking As Needed   ketoconazole (NIZORAL) 2 % shampoo Apply 1 Application topically. Monday/ Wednesday/ Friday   07/17/2023 at  3:00 PM   linaclotide (LINZESS) 290 MCG CAPS capsule Take 290 mcg by mouth daily before breakfast.   07/17/2023 at  7:00 AM   magnesium citrate SOLN Take 1 Bottle by mouth daily as needed for severe constipation.   Taking As Needed    melatonin 5 MG TABS Take 0.5 tablets (2.5 mg total) by mouth at bedtime. 15 tablet 0 07/17/2023 at  8:00 PM   methocarbamol (ROBAXIN) 500 MG tablet Take 500 mg by mouth at bedtime.   Taking   montelukast (SINGULAIR) 10 MG tablet Take 10 mg by mouth daily.   07/17/2023 at  8:00 AM   nicotine (NICODERM CQ - DOSED IN MG/24 HR) 7 mg/24hr patch Place 1 patch (7 mg total) onto the skin daily. 360 patch 0 07/17/2023 at  8:00 AM   nicotine polacrilex (NICORETTE) 2 MG gum Take 2 mg by mouth as needed for smoking cessation.   Taking As Needed   nicotine polacrilex (NICOTINE MINI) 4 MG lozenge Take 1 lozenge (4 mg total) by mouth as needed. 100 tablet 0 Taking As Needed   ondansetron (ZOFRAN-ODT) 4 MG disintegrating tablet Take 1 tablet (4 mg total) by mouth every 8 (eight) hours as needed for nausea or vomiting. 20 tablet 0 Taking As Needed   prazosin (MINIPRESS) 5 MG capsule Take 1 capsule (5 mg total) by mouth at bedtime. 30 capsule  1 07/17/2023 at  8:00 PM   risperiDONE (RISPERDAL) 0.5 MG tablet Take 0.5 mg by mouth 2 (two) times daily. 0800/1600   07/17/2023 at  4:00 PM   topiramate (TOPAMAX) 50 MG tablet Take 1 tablet (50 mg total) by mouth 2 (two) times daily. (Patient taking differently: Take 50 mg by mouth at bedtime.) 60 tablet 1 07/17/2023 at  8:00 PM   traZODone (DESYREL) 50 MG tablet Take 50 mg by mouth at bedtime.   07/17/2023 at  8:00 PM   etonogestrel (NEXPLANON) 68 MG IMPL implant 1 each (68 mg total) by Subdermal route once for 1 dose. 1 each 0     Patient Stressors:    Patient Strengths:    Treatment Modalities: Medication Management, Group therapy, Case management,  1 to 1 session with clinician, Psychoeducation, Recreational therapy.   Physician Treatment Plan for Primary Diagnosis: Bipolar I disorder, most recent episode mixed (HCC) Long Term Goal(s): Improvement in symptoms so as ready for discharge   Short Term Goals: Ability to identify changes in lifestyle to reduce recurrence of  condition will improve Ability to verbalize feelings will improve Ability to disclose and discuss suicidal ideas Ability to demonstrate self-control will improve Ability to identify and develop effective coping behaviors will improve Ability to maintain clinical measurements within normal limits will improve Compliance with prescribed medications will improve Ability to identify triggers associated with substance abuse/mental health issues will improve  Medication Management: Evaluate patient's response, side effects, and tolerance of medication regimen.  Therapeutic Interventions: 1 to 1 sessions, Unit Group sessions and Medication administration.  Evaluation of Outcomes: Progressing  Physician Treatment Plan for Secondary Diagnosis: Principal Problem:   Bipolar I disorder, most recent episode mixed (HCC) Active Problems:   PTSD (post-traumatic stress disorder)   HIV disease (HCC)   Thrombosed hemorrhoids   Abdominal pain  Long Term Goal(s): Improvement in symptoms so as ready for discharge   Short Term Goals: Ability to identify changes in lifestyle to reduce recurrence of condition will improve Ability to verbalize feelings will improve Ability to disclose and discuss suicidal ideas Ability to demonstrate self-control will improve Ability to identify and develop effective coping behaviors will improve Ability to maintain clinical measurements within normal limits will improve Compliance with prescribed medications will improve Ability to identify triggers associated with substance abuse/mental health issues will improve     Medication Management: Evaluate patient's response, side effects, and tolerance of medication regimen.  Therapeutic Interventions: 1 to 1 sessions, Unit Group sessions and Medication administration.  Evaluation of Outcomes: Progressing   RN Treatment Plan for Primary Diagnosis: Bipolar I disorder, most recent episode mixed (HCC) Long Term Goal(s):  Knowledge of disease and therapeutic regimen to maintain health will improve  Short Term Goals: Ability to remain free from injury will improve, Ability to verbalize frustration and anger appropriately will improve, Ability to demonstrate self-control, Ability to participate in decision making will improve, Ability to verbalize feelings will improve, Ability to disclose and discuss suicidal ideas, Ability to identify and develop effective coping behaviors will improve, and Compliance with prescribed medications will improve   Medication Management: RN will administer medications as ordered by provider, will assess and evaluate patient's response and provide education to patient for prescribed medication. RN will report any adverse and/or side effects to prescribing provider.  Therapeutic Interventions: 1 on 1 counseling sessions, Psychoeducation, Medication administration, Evaluate responses to treatment, Monitor vital signs and CBGs as ordered, Perform/monitor CIWA, COWS, AIMS and Fall Risk screenings as  ordered, Perform wound care treatments as ordered.  Evaluation of Outcomes: Progressing   LCSW Treatment Plan for Primary Diagnosis: Bipolar I disorder, most recent episode mixed (HCC) Long Term Goal(s): Safe transition to appropriate next level of care at discharge, Engage patient in therapeutic group addressing interpersonal concerns.  Short Term Goals: Engage patient in aftercare planning with referrals and resources, Increase social support, Increase ability to appropriately verbalize feelings, Increase emotional regulation, Facilitate acceptance of mental health diagnosis and concerns, and Increase skills for wellness and recovery   Therapeutic Interventions: Assess for all discharge needs, 1 to 1 time with Social worker, Explore available resources and support systems, Assess for adequacy in community support network, Educate family and significant other(s) on suicide prevention, Complete  Psychosocial Assessment, Interpersonal group therapy.  Evaluation of Outcomes: Progressing   Progress in Treatment: Attending groups: Yes. Participating in groups: Yes. Taking medication as prescribed: Yes. Toleration medication: Yes. Family/Significant other contact made: Yes, individual(s) contacted:  guardian, [name], [phone]  Patient understands diagnosis: Yes. Discussing patient identified problems/goals with staff: Yes. Medical problems stabilized or resolved: Yes. Denies suicidal/homicidal ideation: Yes. Issues/concerns per patient self-inventory: No. Other: None  New problem(s) identified:  No, Describe:  none identified. Update 08/03/2023:  No changes at this time. Update 08/08/2023:  No changes at this time. Update 08/13/2023: No changes at this time. Update 08/18/23: Patient has been navigating her complex feelings while interviewing for placement.   New Short Term/Long Term Goal(s): medication management for mood stabilization; elimination of SI thoughts; development of comprehensive mental wellness plan. Update 07/24/23: No changes at this time. Update: 07/29/2023: No changes at this time.  Update 08/03/2023:  No changes at this time. Update 08/08/2023:  No changes at this time.  Update 08/13/2023: No changes at this time. Update 08/18/2023: No changes at this time.   Patient Goals:  Pt declined to participate in treatment team meeting. Update 07/24/23: No changes at this time.  Update 08/03/2023:  No changes at this time. Update 08/08/2023:  No changes at this time.   Update 08/13/2023: No changes at this time. Update 08/18/2023: No changes at this time.   Discharge Plan or Barriers:  CSW will assist pt/guardian with development of an appropriate aftercare/discharge plan. Update 07/24/23: Guardian notified team that pt will not be returning to group home. Update: 07/29/2023: No changes at this time.  Update 08/03/2023:  Patient's guardian continues to look for placement for the  patient.  Patient remains safe on the unit. Update 08/08/2023:  Patient continues to wait for placement.  CSW has sent FL2 to the patient's guardian to assist in bed placement.   Update 08/13/2023: The patient interview for [facility] was not successful, she has interviews with her guardian and other [facility] providers coming up. Update 08/18/2023: Patient has had interview with [facility] provider. Team is still awaiting facility's decision.   Reason for Continuation of Hospitalization:  Aggression Depression Medication stabilization Suicidal ideation  Estimated Length of Stay: 1-7 days Update 07/24/23: No changes at this time. Update: 07/29/2023: No changes at this time  Update 08/03/2023:  TBD Update 08/08/2023:  TBD  Update 08/13/2023: TBD  Update 08/18/2023: TBD  Last 3 [place] Suicide Severity Risk Score: Flowsheet Row Admission (Current) from 07/18/2023 in [facility] INPATIENT BEH[facility]AL MEDICINE ED from 07/11/2023 in [facility] Emergency Department at [facility] Admission (Discharged) from 07/05/2023 in [facility] INPATIENT BEHAVIORAL MEDICINE  C-SSRS RISK CATEGORY High Risk Error: Q3, 4, or 5 should not be populated when Q2 is No High  Risk       Last PHQ 2/9 Scores:    05/04/2023    9:21 AM 02/16/2023    9:49 AM 11/17/2022   10:53 AM  Depression screen PHQ 2/9  Decreased Interest 0 0 0  Down, Depressed, Hopeless 0 0 1  PHQ - 2 Score 0 0 1    Scribe for Treatment Team: Lowry Ram, [place] 08/18/2023 1:37 PM

## 2023-08-18 NOTE — Plan of Care (Signed)
   Problem: Education: Goal: Knowledge of [place] [facility] Education information/materials will improve Outcome: Progressing Goal: Emotional status will improve Outcome: Progressing Goal: Mental status will improve Outcome: Progressing

## 2023-08-19 DIAGNOSIS — F3160 Bipolar disorder, current episode mixed, unspecified: Secondary | ICD-10-CM | POA: Diagnosis not present

## 2023-08-19 MED ORDER — NICOTINE POLACRILEX 2 MG MT GUM
4.0000 mg | CHEWING_GUM | OROMUCOSAL | Status: DC | PRN
  Administered 2023-08-19 – 2023-09-08 (×72): 4 mg via ORAL
  Filled 2023-08-19 (×86): qty 2

## 2023-08-19 MED ORDER — DIPHENHYDRAMINE HCL 25 MG PO CAPS: 50.0000 mg | ORAL_CAPSULE | Freq: Once | ORAL | Status: AC

## 2023-08-19 MED ORDER — DIPHENHYDRAMINE HCL 50 MG/ML IJ SOLN
INTRAMUSCULAR | Status: AC
  Administered 2023-08-19: 50 mg via INTRAMUSCULAR
  Filled 2023-08-19: qty 1

## 2023-08-19 MED ORDER — INSULIN ASPART 100 UNIT/ML IJ SOLN
0.0000 [IU] | Freq: Three times a day (TID) | INTRAMUSCULAR | Status: DC
  Administered 2023-08-20 – 2023-08-25 (×8): 1 [IU] via SUBCUTANEOUS
  Administered 2023-08-25 (×2): 2 [IU] via SUBCUTANEOUS
  Administered 2023-08-26: 1 [IU] via SUBCUTANEOUS
  Administered 2023-08-26 – 2023-08-27 (×3): 2 [IU] via SUBCUTANEOUS
  Administered 2023-08-27 (×2): 1 [IU] via SUBCUTANEOUS
  Administered 2023-08-28 – 2023-08-30 (×3): 2 [IU] via SUBCUTANEOUS
  Administered 2023-08-30 (×2): 1 [IU] via SUBCUTANEOUS
  Administered 2023-08-31: 2 [IU] via SUBCUTANEOUS
  Administered 2023-09-01: 1 [IU] via SUBCUTANEOUS
  Administered 2023-09-02: 2 [IU] via SUBCUTANEOUS
  Administered 2023-09-02: 1 [IU] via SUBCUTANEOUS
  Filled 2023-08-19 (×25): qty 1

## 2023-08-19 MED ORDER — DIPHENHYDRAMINE HCL 50 MG/ML IJ SOLN: 50.0000 mg | Freq: Once | INTRAMUSCULAR | Status: AC

## 2023-08-19 NOTE — Group Note (Addendum)
 Date:  08/19/2023 Time:  9:32 PM  Group Topic/Focus:  Managing Feelings:   The focus of this group is to identify what feelings patients have difficulty handling and develop a plan to handle them in a healthier way upon discharge.    Participation Level:  Active  Participation Quality:  Appropriate  Affect:  Anxious  Cognitive:  Appropriate  Insight: Good  Engagement in Group:  Engaged  Modes of Intervention:  Discussion  Additional Comments:  Patient presented below normal limits. All paperwork was completed.   Lorenda Ishihara 08/19/2023, 9:32 PM

## 2023-08-19 NOTE — Progress Notes (Signed)
   08/19/23 1207  Point of Care Tests  CBG -Manual entry (!) 142

## 2023-08-19 NOTE — Progress Notes (Signed)
   08/19/23 0802  Point of Care Tests  CBG -Manual entry (!) 182

## 2023-08-19 NOTE — Group Note (Signed)
 Date:  08/19/2023 Time:  10:42 AM  Group Topic/Focus:  Making Healthy Choices:   The focus of this group is to help patients identify negative/unhealthy choices they were using prior to admission and identify positive/healthier coping strategies to replace them upon discharge. Self Care:   The focus of this group is to help patients understand the importance of self-care in order to improve or restore emotional, physical, spiritual, interpersonal, and financial health.    Participation Level:  Did Not Attend    Crystal Black 08/19/2023, 10:42 AM

## 2023-08-19 NOTE — Progress Notes (Signed)
   08/19/23 0819  Psych Admission Type (Psych Patients Only)  Admission Status Voluntary  Psychosocial Assessment  Patient Complaints None  Eye Contact Brief  Facial Expression Flat  Affect Appropriate to circumstance  Speech Logical/coherent  Interaction Assertive  Motor Activity Slow  Appearance/Hygiene Unremarkable  Behavior Characteristics Cooperative;Appropriate to situation  Mood Pleasant (patient's goal for today is "going home", in which "being respectful", will help her to achieve her goal.)  Aggressive Behavior  Effect No apparent injury  Thought Process  Coherency WDL  Content WDL  Delusions None reported or observed  Perception WDL  Hallucination None reported or observed  Judgment Limited  Confusion None  Danger to Self  Current suicidal ideation? Denies  Self-Injurious Behavior No self-injurious ideation or behavior indicators observed or expressed   Agreement Not to Harm Self Yes  Description of Agreement [place]  Danger to Others  Danger to Others None reported or observed  Danger to Others Abnormal  Harmful Behavior to others No threats or harm toward other people  Destructive Behavior No threats or harm toward property

## 2023-08-19 NOTE — Plan of Care (Signed)
  Problem: Education: Goal: Knowledge of [facility] [place] [facility] Education information/materials will improve Outcome: Progressing Goal: Emotional status will improve Outcome: Progressing Goal: Mental status will improve Outcome: Progressing Goal: Verbalization of understanding the information provided will improve Outcome: Progressing   Problem: Activity: Goal: Interest or engagement in activities will improve Outcome: Progressing Goal: Sleeping patterns will improve Outcome: Progressing   Problem: Coping: Goal: Ability to verbalize frustrations and anger appropriately will improve Outcome: Progressing Goal: Ability to demonstrate self-control will improve Outcome: Progressing   Problem: Health Behavior/Discharge Planning: Goal: Identification of resources available to assist in meeting health care needs will improve Outcome: Progressing Goal: Compliance with treatment plan for underlying cause of condition will improve Outcome: Progressing   Problem: Physical Regulation: Goal: Ability to maintain clinical measurements within normal limits will improve Outcome: Progressing   Problem: Safety: Goal: Periods of time without injury will increase Outcome: Progressing   Problem: Education: Goal: Utilization of techniques to improve thought processes will improve Outcome: Progressing Goal: Knowledge of the prescribed therapeutic regimen will improve Outcome: Progressing   Problem: Activity: Goal: Interest or engagement in leisure activities will improve Outcome: Progressing Goal: Imbalance in normal sleep/wake cycle will improve Outcome: Progressing   Problem: Coping: Goal: Coping ability will improve Outcome: Progressing Goal: Will verbalize feelings Outcome: Progressing   Problem: Health Behavior/Discharge Planning: Goal: Ability to make decisions will improve Outcome: Progressing Goal: Compliance with therapeutic regimen will improve Outcome: Progressing    Problem: Role Relationship: Goal: Will demonstrate positive changes in social behaviors and relationships Outcome: Progressing   Problem: Safety: Goal: Ability to disclose and discuss suicidal ideas will improve Outcome: Progressing Goal: Ability to identify and utilize support systems that promote safety will improve Outcome: Progressing   Problem: Self-Concept: Goal: Will verbalize positive feelings about self Outcome: Progressing Goal: Level of anxiety will decrease Outcome: Progressing   Problem: Education: Goal: Knowledge of General Education information will improve Description: Including pain rating scale, medication(s)/side effects and non-pharmacologic comfort measures Outcome: Progressing   Problem: Health Behavior/Discharge Planning: Goal: Ability to manage health-related needs will improve Outcome: Progressing

## 2023-08-19 NOTE — Progress Notes (Signed)
   08/19/23 1627  Point of Care Tests  CBG -Manual entry (!) 171
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
## 2023-08-19 NOTE — Progress Notes (Signed)
 1:1 Patient Monitoring (while awake):  0800: Patient is in the dayroom, eating breakfast.  0900: Patient is in the dayroom, talking with other members on the unit, as well as staff.  1200: Patient is in the dayroom, eating lunch.   1300: Patient is in the dayroom, playing cards.  1630: Patient is in the dayroom, eating dinner.  1830: Patient is in the dayroom, playing cards with other members on the unit.

## 2023-08-19 NOTE — Progress Notes (Signed)
   08/18/23 2050  Psych Admission Type (Psych Patients Only)  Admission Status Voluntary  Psychosocial Assessment  Patient Complaints None  Eye Contact Brief  Facial Expression Flat  Affect Appropriate to circumstance;Blunted  Speech Logical/coherent  Interaction Assertive;Demanding  Motor Activity Slow  Appearance/Hygiene Improved  Behavior Characteristics Cooperative;Appropriate to situation  Mood Pleasant  Aggressive Behavior  Effect No apparent injury  Thought Process  Coherency WDL  Content WDL  Delusions None reported or observed  Perception WDL  Hallucination None reported or observed  Judgment Limited  Confusion None  Danger to Self  Current suicidal ideation? Denies  Self-Injurious Behavior No self-injurious ideation or behavior indicators observed or expressed   Agreement Not to Harm Self Yes  Description of Agreement verbal  Danger to Others  Danger to Others None reported or observed  Danger to Others Abnormal  Harmful Behavior to others No threats or harm toward other people  Destructive Behavior No threats or harm toward property   Patient alert and oriented x 3, no distress noted, she denies SI/HI/AVH, affect is congruent , thoughts are organized and coherent , will continue to monitor.

## 2023-08-19 NOTE — Progress Notes (Signed)
   08/18/23 1619  Point of Care Tests  CBG -Manual entry (!) 147

## 2023-08-20 DIAGNOSIS — F3160 Bipolar disorder, current episode mixed, unspecified: Secondary | ICD-10-CM | POA: Diagnosis not present

## 2023-08-20 MED ORDER — EPINEPHRINE 0.3 MG/0.3ML IJ SOAJ
0.3000 mg | Freq: Once | INTRAMUSCULAR | Status: DC
  Filled 2023-08-20: qty 0.3

## 2023-08-20 NOTE — Group Note (Signed)
 Date:  08/20/2023 Time:  4:38 PM  Group Topic/Focus:  Activity Group: The focus of the group is to promote activity for the patients and to encourage patients to go outside to the courtyard and get some fresh air and some exercise.    Participation Level:  Did Not Attend   Crystal Black 08/20/2023, 4:38 PM
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
## 2023-08-20 NOTE — Progress Notes (Addendum)
   08/20/23 1858  Vital Signs  Pulse Rate (!) 140   Notified Coralee North PMHNP and primary nurse

## 2023-08-20 NOTE — Plan of Care (Signed)
 Patient pleasant and cooperative on approach. Rated her anxiety and depression 0/10. Patient states her goal for today is " to be the best I can.No sad thoughts." Denies SI,HI and AVH. Appropriate with staff & peers at this time. Appetite and energy level good. Support and encouragement given.

## 2023-08-20 NOTE — Progress Notes (Signed)
 Nursing Shift Note:  1900-0700  Attended Evening Group: yes Medication Compliant:  yes Behavior:  cooperative; attention seeking Sleep Quality: good Significant Changes: Pt experienced itching of throat after reporting that she smelled peanut butter in the dayroom during snack..  Provider notified and order for Benadryl injection obtained.  During the episode pt o2sat remained between 98 and 100%.  Pt expressed complete relief from symptoms after receiving injection.  No further concerns related to allergic symptoms reported or noted.  Continued safety sitter while awake.  Continued monitoring for safety.     08/20/23 0500  Psych Admission Type (Psych Patients Only)  Admission Status Voluntary  Psychosocial Assessment  Patient Complaints None  Eye Contact Brief  Facial Expression Animated  Affect Appropriate to circumstance  Speech Logical/coherent  Interaction Assertive  Motor Activity Slow  Appearance/Hygiene Unremarkable  Behavior Characteristics Cooperative  Mood Pleasant  Thought Process  Coherency WDL  Content WDL  Delusions None reported or observed  Perception WDL  Hallucination None reported or observed  Judgment Limited  Confusion None  Danger to Self  Current suicidal ideation? Denies  Danger to Others  Danger to Others None reported or observed  Danger to Others Abnormal  Harmful Behavior to others No threats or harm toward other people  Description of Harmful Behavior no  Destructive Behavior No threats or harm toward property

## 2023-08-20 NOTE — Plan of Care (Signed)
   Problem: Education: Goal: Emotional status will improve Outcome: Progressing   Problem: Education: Goal: Mental status will improve Outcome: Progressing   Problem: Activity: Goal: Interest or engagement in activities will improve Outcome: Progressing

## 2023-08-20 NOTE — Group Note (Signed)
 Date:  08/20/2023 Time:  3:11 PM  Group Topic/Focus:  Goals Group:   The focus of this group is to help patients establish daily goals to achieve during treatment and discuss how the patient can incorporate goal setting into their daily lives to aide in recovery.    Participation Level:  Did Not Attend   Mary Sella Mitzy Naron 08/20/2023, 3:11 PM

## 2023-08-21 LAB — GLUCOSE, CAPILLARY
Glucose-Capillary: 147 mg/dL — ABNORMAL HIGH (ref 70–99)
Glucose-Capillary: 137 mg/dL — ABNORMAL HIGH (ref 70–99)
Glucose-Capillary: 182 mg/dL — ABNORMAL HIGH (ref 70–99)
Glucose-Capillary: 142 mg/dL — ABNORMAL HIGH (ref 70–99)
Glucose-Capillary: 171 mg/dL — ABNORMAL HIGH (ref 70–99)
Glucose-Capillary: 241 mg/dL — ABNORMAL HIGH (ref 70–99)
Glucose-Capillary: 120 mg/dL — ABNORMAL HIGH (ref 70–99)
Glucose-Capillary: 139 mg/dL — ABNORMAL HIGH (ref 70–99)
Glucose-Capillary: 137 mg/dL — ABNORMAL HIGH (ref 70–99)
Glucose-Capillary: 200 mg/dL — ABNORMAL HIGH (ref 70–99)
Glucose-Capillary: 138 mg/dL — ABNORMAL HIGH (ref 70–99)
Glucose-Capillary: 177 mg/dL — ABNORMAL HIGH (ref 70–99)

## 2023-08-21 NOTE — BHH Counselor (Signed)
 CSW attempted to reach [facility] to identify the worker working on patients case in her guardians absence while on vacation.   CSW left HIPAA compliant voicemail.  [name], MSW, LCSW 08/21/2023 3:13 PM

## 2023-08-21 NOTE — Group Note (Signed)
 Recreation Therapy Group Note   Group Topic:Health and Wellness  Group Date: 08/21/2023 Start Time: 1500 End Time: 1600 Facilitators: Rosina Lowenstein, LRT, CTRS Location: Courtyard  Group Description: [employer]. LRT and patients played games of basketball, drew with chalk, and played corn hole while outside in the courtyard while getting fresh air and sunlight. Music was being played in the background. LRT and peers conversed about different games they have played before, what they do in their free time and anything else that is on their minds. LRT encouraged pts to drink water after being outside, sweating and getting their heart rate up.  Goal Area(s) Addressed: Patient will build on frustration tolerance skills. Patients will partake in a competitive play game with peers. Patients will gain knowledge of new leisure interest/hobby.   Affect/Mood: Appropriate   Participation Level: Moderate    Clinical Observations/Individualized Feedback: Crystal Black came late to group. Pt interacted well with LRT and peers while present.   Plan: Continue to engage patient in RT group sessions 2-3x/week.   Rosina Lowenstein, LRT, CTRS 08/21/2023 5:07 PM

## 2023-08-21 NOTE — Group Note (Signed)
 LCSW Group Therapy Note   Group Date: 08/21/2023 Start Time: 1300 End Time: 1400   Type of Therapy and Topic:  Group Therapy: Challenging Core Beliefs  Participation Level:  Active  Description of Group:  Patients were educated about core beliefs and asked to identify one harmful core belief that they have. Patients were asked to explore from where those beliefs originate. Patients were asked to discuss how those beliefs make them feel and the resulting behaviors of those beliefs. They were then be asked if those beliefs are true and, if so, what evidence they have to support them. Lastly, group members were challenged to replace those negative core beliefs with helpful beliefs.   Therapeutic Goals:   1. Patient will identify harmful core beliefs and explore the origins of such beliefs. 2. Patient will identify feelings and behaviors that result from those core beliefs. 3. Patient will discuss whether such beliefs are true. 4.  Patient will replace harmful core beliefs with helpful ones.  Summary of Patient Progress:  Patient actively engaged in processing and exploring how core beliefs are formed and how they impact thoughts, feelings, and behaviors. Patient proved open to input from peers and feedback from CSW. Patient demonstrated proficient insight into the subject matter, was respectful and supportive of peers, and participated throughout the entire session.  Therapeutic Modalities: Cognitive Behavioral Therapy; Solution-Focused Therapy   Lowry Ram, Theresia Majors 08/21/2023  1:56 PM

## 2023-08-21 NOTE — Plan of Care (Signed)
   Problem: Education: Goal: Emotional status will improve Outcome: Progressing   Problem: Education: Goal: Mental status will improve Outcome: Progressing

## 2023-08-21 NOTE — Group Note (Signed)
 Recreation Therapy Group Note   Group Topic:Coping Skills  Group Date: 08/21/2023 Start Time: 1015 End Time: 1100 Facilitators: Rosina Lowenstein, LRT, CTRS Location:  Craft Room  Group Description: Mind Map.  Patient was provided a blank template of a diagram with 32 blank boxes in a tiered system, branching from the center (similar to a bubble chart). LRT directed patients to label the middle of the diagram "Coping Skills". LRT and patients then came up with 8 different coping skills as examples. Pt were directed to record their coping skills in the 2nd tier boxes closest to the center.  Patients would then share their coping skills with the group as LRT wrote them out. LRT gave a handout of 99 different coping skills at the end of group.   Goal Area(s) Addressed: Patients will be able to define "coping skills". Patient will identify new coping skills.  Patient will increase communication.   Affect/Mood: N/A   Participation Level: Did not attend    Clinical Observations/Individualized Feedback: Patient did not attend group.   Plan: Continue to engage patient in RT group sessions 2-3x/week.   Rosina Lowenstein, LRT, CTRS 08/21/2023 11:31 AM

## 2023-08-21 NOTE — Progress Notes (Addendum)
 Pt remains on 1:1 with sitter at bedside. Affect and mood improved, elated about upcoming discharge/placement. Pt pleasant and cooperative with taking her medications and without complaints.     08/21/23 1500  Psych Admission Type (Psych Patients Only)  Admission Status Voluntary  Psychosocial Assessment  Patient Complaints None  Eye Contact Brief  Facial Expression Animated  Affect Appropriate to circumstance  Speech Logical/coherent  Interaction Assertive  Motor Activity Slow  Appearance/Hygiene Unremarkable  Behavior Characteristics Cooperative  Mood Pleasant  Thought Process  Coherency WDL  Content WDL  Delusions None reported or observed  Perception WDL  Hallucination None reported or observed  Judgment Limited  Confusion None  Danger to Self  Current suicidal ideation?  (denies)  Agreement Not to Harm Self Yes  Description of Agreement verbal

## 2023-08-21 NOTE — Progress Notes (Signed)
    08/21/23 0200  Psych Admission Type (Psych Patients Only)  Admission Status Voluntary  Psychosocial Assessment  Patient Complaints None  Eye Contact Brief  Facial Expression Animated  Affect Appropriate to circumstance  Speech Logical/coherent  Interaction Assertive  Motor Activity Slow  Appearance/Hygiene Unremarkable  Behavior Characteristics Cooperative  Mood Pleasant  Thought Process  Coherency WDL  Content WDL  Delusions None reported or observed  Perception WDL  Hallucination None reported or observed  Judgment Limited  Confusion None  Danger to Self  Current suicidal ideation? Denies  Danger to Others  Danger to Others None reported or observed  Danger to Others Abnormal  Harmful Behavior to others No threats or harm toward other people  Destructive Behavior No threats or harm toward property

## 2023-08-21 NOTE — Group Note (Signed)
 Date:  08/21/2023 Time:  3:50 PM  Group Topic/Focus:  Outdoor recreation structured activity.    Participation Level:  Active  Participation Quality:  Appropriate  Affect:  Appropriate  Cognitive:  Appropriate  Insight: Appropriate  Engagement in Group:  Developing/Improving  Modes of Intervention:  Activity  Additional Comments:    Crystal Black 08/21/2023, 3:50 PM

## 2023-08-21 NOTE — Group Note (Signed)
 Date:  08/21/2023 Time:  8:48 PM  Group Topic/Focus:  Wrap-Up Group:   The focus of this group is to help patients review their daily goal of treatment and discuss progress on daily workbooks.    Participation Level:  Active  Participation Quality:  Appropriate  Affect:  Appropriate  Cognitive:  Appropriate  Insight: Appropriate  Engagement in Group:  Engaged  Modes of Intervention:  Activity  Additional Comments:    Crystal Black Crystal Black 08/21/2023, 8:48 PM
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
## 2023-08-21 NOTE — Group Note (Signed)
 Date:  08/21/2023 Time:  11:10 AM  Group Topic/Focus:  Goals Group:   The focus of this group is to help patients establish daily goals to achieve during treatment and discuss how the patient can incorporate goal setting into their daily lives to aide in recovery. Rediscovering Joy:   The focus of this group is to explore various ways to relieve stress in a positive manner.    Participation Level:  Did Not Attend  P Rhoderick Farrel 08/21/2023, 11:10 AM

## 2023-08-22 DIAGNOSIS — F3160 Bipolar disorder, current episode mixed, unspecified: Secondary | ICD-10-CM | POA: Diagnosis not present

## 2023-08-22 LAB — GLUCOSE, CAPILLARY
Glucose-Capillary: 110 mg/dL — ABNORMAL HIGH (ref 70–99)
Glucose-Capillary: 209 mg/dL — ABNORMAL HIGH (ref 70–99)
Glucose-Capillary: 129 mg/dL — ABNORMAL HIGH (ref 70–99)

## 2023-08-22 NOTE — [facility] Counselor (Signed)
 CSW notes that an email was sent on Monday 08/21/2023 to [employer] to schedule face to face. At this time no response has been received.    CSW does not have a number to reach out to [employer].   CSW has Museum/gallery conservator Monique to inquire about any contact information that can be provided.  CSW awaiting response.   No response from voicemails left wit the guardianship service.  [name], MSW, LCSW 08/22/2023 3:24 PM
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
## 2023-08-22 NOTE — Plan of Care (Signed)
 Patient over slept and skip breakfast today. Pleasant and cooperative on approach. Denies SI,HI and AVH. Attended groups. Appetite and energy level good. Remains with sitter for safety. Patient receptive with staff. Support and encouragement given.

## 2023-08-22 NOTE — Progress Notes (Signed)
 Pleasant and cooperative with care. Medication compliant. Appropriate with staff and peers. Denies SI, HI,AVH. Remains : with sitter for safety. Encouragement and support provided. Safety checks maintained. Medications given as prescribed. Pt receptive and remains safe on unit with q min checks.

## 2023-08-22 NOTE — Group Note (Signed)
 Recreation Therapy Group Note   Group Topic:Health and Wellness  Group Date: 08/22/2023 Start Time: 1440 End Time: 1600 Facilitators: Rosina Lowenstein, LRT, CTRS Location: Courtyard  Group Description: [employer]. LRT and patients played games of basketball, drew with chalk, and played corn hole while outside in the courtyard while getting fresh air and sunlight. Music was being played in the background. LRT and peers conversed about different games they have played before, what they do in their free time and anything else that is on their minds. LRT encouraged pts to drink water after being outside, sweating and getting their heart rate up.  Goal Area(s) Addressed: Patient will build on frustration tolerance skills. Patients will partake in a competitive play game with peers. Patients will gain knowledge of new leisure interest/hobby.    Affect/Mood: Appropriate   Participation Level: Active   Participation Quality: Independent   Behavior: Appropriate   Speech/Thought Process: Coherent   Insight: Fair   Judgement: Fair    Modes of Intervention: Activity   Patient Response to Interventions:  Receptive   Education Outcome:  Acknowledges education   Clinical Observations/Individualized Feedback: Madolin was active in their participation of session activities and group discussion. Pt chose to talk with peers and listen to music while outside. Pt interacted well with LRT and peers duration of session.    Plan: Continue to engage patient in RT group sessions 2-3x/week.   Rosina Lowenstein, LRT, CTRS 08/22/2023 4:57 PM

## 2023-08-22 NOTE — [facility] Counselor (Signed)
 CSW attempted to contact the patient's guardianship provider.   CSW left HIPAA compliant voicemail.  [name], MSW, LCSW 08/22/2023 10:31 AM

## 2023-08-22 NOTE — Plan of Care (Signed)
   Problem: Education: Goal: Emotional status will improve Outcome: Progressing Goal: Mental status will improve Outcome: Progressing   Problem: Activity: Goal: Interest or engagement in activities will improve Outcome: Progressing

## 2023-08-22 NOTE — Group Note (Signed)
 Date:  08/22/2023 Time:  9:42 AM  Group Topic/Focus:  Goals Group:   The focus of this group is to help patients establish daily goals to achieve during treatment and discuss how the patient can incorporate goal setting into their daily lives to aide in recovery.    Participation Level:  Did Not Attend   Lynelle Smoke [facility] 08/22/2023, 9:42 AM

## 2023-08-22 NOTE — Group Note (Signed)
 [facility] LCSW Group Therapy Note   Group Date: 08/22/2023 Start Time: 1315 End Time: 1415   Type of Therapy and Topic: Group Therapy: Avoiding Self-Sabotaging and Enabling Behaviors  Participation Level: Minimal  Mood:  Description of Group:  In this group, patients will learn how to identify obstacles, self-sabotaging and enabling behaviors, as well as: what are they, why do we do them and what needs these behaviors meet. Discuss unhealthy relationships and how to have positive healthy boundaries with those that sabotage and enable. Explore aspects of self-sabotage and enabling in yourself and how to limit these self-destructive behaviors in everyday life.   Therapeutic Goals: 1. Patient will identify one obstacle that relates to self-sabotage and enabling behaviors 2. Patient will identify one personal self-sabotaging or enabling behavior they did prior to admission 3. Patient will state a plan to change the above identified behavior 4. Patient will demonstrate ability to communicate their needs through discussion and/or role play.    Summary of Patient Progress: Patient was in and out group.  Patient asked appropriate questions.  Patient was engaged when present and supportive of other group members.  Insight was poor.    Therapeutic Modalities:  Cognitive Behavioral Therapy Person-Centered Therapy Motivational Interviewing    Harden Mo, LCSW
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
## 2023-08-23 DIAGNOSIS — F3160 Bipolar disorder, current episode mixed, unspecified: Secondary | ICD-10-CM | POA: Diagnosis not present

## 2023-08-23 LAB — GLUCOSE, CAPILLARY
Glucose-Capillary: 120 mg/dL — ABNORMAL HIGH (ref 70–99)
Glucose-Capillary: 247 mg/dL — ABNORMAL HIGH (ref 70–99)
Glucose-Capillary: 128 mg/dL — ABNORMAL HIGH (ref 70–99)
Glucose-Capillary: 111 mg/dL — ABNORMAL HIGH (ref 70–99)

## 2023-08-23 NOTE — Group Note (Signed)
 Date:  08/23/2023 Time:  11:20 PM  Group Topic/Focus:  Wrap-Up Group:   The focus of this group is to help patients review their daily goal of treatment and discuss progress on daily workbooks.    Participation Level:  Active  Participation Quality:  Appropriate, Attentive, Sharing, and Supportive  Affect:  Appropriate  Cognitive:  Appropriate  Insight: Appropriate and Good  Engagement in Group:  Engaged, Improving, and Supportive  Modes of Intervention:  Discussion and Support  Additional Comments:     [name] 08/23/2023, 11:20 PM

## 2023-08-23 NOTE — Progress Notes (Signed)
   08/23/23 1021  Psych Admission Type (Psych Patients Only)  Admission Status Voluntary  Psychosocial Assessment  Patient Complaints None  Eye Contact Brief  Facial Expression Animated  Affect Appropriate to circumstance  Speech Logical/coherent  Interaction Assertive  Motor Activity Slow  Appearance/Hygiene Unremarkable  Behavior Characteristics Cooperative  Mood Pleasant  Thought Process  Coherency WDL  Content WDL  Delusions None reported or observed  Perception WDL  Hallucination None reported or observed  Judgment Impaired  Confusion None  Danger to Self  Current suicidal ideation? Denies  Danger to Others  Danger to Others None reported or observed  Danger to Others Abnormal  Harmful Behavior to others No threats or harm toward other people  Destructive Behavior No threats or harm toward property   Continued on 1:1 monitoring for safety. Attended all group. Tolerated all medications. No hyper/hypoglycemic symptoms noted. Able to inject self with insulin.
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
## 2023-08-23 NOTE — [facility] Counselor (Addendum)
 ADDENDUM CSW received contact information for Rebeeca, pt's guardian's ([name]) supervisor.  Lurena Joiner reviewed the information provided for her by [name]. CSW was asking for contact information for [facility] and [employer] so requested face to face meeting can occur.  Lurena Joiner reports that the information was not provided and she will reach out to [place] on vacation and call this Clinical research associate back.  [name], MSW, LCSW 08/23/2023 2:40 PM   ADDENDUM CSW callled main office for [facility]. This time the call made a sound as if a fax machine was connected.  CSW attempted twice with the same result both times.  CSW attempted to call the crisis line and was sent to voicemail where this CSW left a HIPAA compliant voicemail.  CSW sent email to the Librarian, academic, Designer, jewellery of [facility].  [name], MSW, LCSW 08/23/2023 12:47 PM    CSW logged on for scheduled 10:30AM meeting to discuss patient's transition.  No one joined the call and CSW left after 15 minutes.  CSW was aware that pt's guardian and [facility] Care Manager would not be on the call due to being out of the office, however, [employer] was to join.  CSW to follow up.  [name], MSW, LCSW 08/23/2023 10:55 AM

## 2023-08-23 NOTE — Group Note (Signed)
 [facility] LCSW Group Therapy Note   Group Date: 08/23/2023 Start Time: 1400 End Time: 1500  Type of Therapy and Topic:  Group Therapy:  Feelings around Relapse and Recovery  Participation Level:  Did Not Attend    Description of Group:    Patients in this group will discuss emotions they experience before and after a relapse. They will process how experiencing these feelings, or avoidance of experiencing them, relates to having a relapse. Facilitator will guide patients to explore emotions they have related to recovery. Patients will be encouraged to process which emotions are more powerful. They will be guided to discuss the emotional reaction significant others in their lives may have to patients' relapse or recovery. Patients will be assisted in exploring ways to respond to the emotions of others without this contributing to a relapse.  Therapeutic Goals: Patient will identify two or more emotions that lead to relapse for them:  Patient will identify two emotions that result when they relapse:  Patient will identify two emotions related to recovery:  Patient will demonstrate ability to communicate their needs through discussion and/or role plays.   Summary of Patient Progress: X   Therapeutic Modalities:   Cognitive Behavioral Therapy Solution-Focused Therapy Assertiveness Training Relapse Prevention Therapy   Glenis Smoker, LCSW

## 2023-08-23 NOTE — BH IP Treatment Plan (Signed)
 Interdisciplinary Treatment and Diagnostic Plan Update  08/23/2023 Time of Session: 11:00AM Crystal Black MRN: [id number]  Principal Diagnosis: Bipolar I disorder, most recent episode mixed (HCC)  Secondary Diagnoses: Principal Problem:   Bipolar I disorder, most recent episode mixed (HCC) Active Problems:   PTSD (post-traumatic stress disorder)   HIV disease (HCC)   Thrombosed hemorrhoids   Abdominal pain   Current Medications:  Current Facility-Administered Medications  Medication Dose Route Frequency Provider Last Rate Last Admin   albuterol (VENTOLIN HFA) 108 (90 Base) MCG/ACT inhaler 2 puff  2 puff Inhalation Q4H PRN [name], NP   2 puff at 08/13/23 1543   alum & mag hydroxide-simeth (MAALOX/MYLANTA) 200-200-20 MG/5ML suspension 30 mL  30 mL Oral Q4H PRN McLauchlin, [name], NP   30 mL at 08/02/23 2036   ARIPiprazole ER (ABILIFY MAINTENA) injection 300 mg  300 mg Intramuscular Q28 days [name], NP   300 mg at 08/07/23 1009   clonazePAM (KLONOPIN) disintegrating tablet 0.25 mg  0.25 mg Oral Daily [name], NP   0.25 mg at 08/23/23 0858   darunavir-cobicistat (PREZCOBIX) 800-150 MG per tablet 1 tablet  1 tablet Oral Q breakfast [name], NP   1 tablet at 08/23/23 4098   haloperidol (HALDOL) tablet 5 mg  5 mg Oral TID PRN McLauchlin, [name], NP       And   diphenhydrAMINE (BENADRYL) capsule 50 mg  50 mg Oral TID PRN McLauchlin, [name], NP       haloperidol (HALDOL) tablet 5 mg  5 mg Oral TID PRN [name], NP   5 mg at 08/14/23 1942   And   diphenhydrAMINE (BENADRYL) capsule 50 mg  50 mg Oral TID PRN [name], NP   50 mg at 08/14/23 1942   haloperidol lactate (HALDOL) injection 5 mg  5 mg Intramuscular TID PRN McLauchlin, [name], NP       And   diphenhydrAMINE (BENADRYL) injection 50 mg  50 mg Intramuscular TID PRN McLauchlin, [name], NP   50 mg at 08/04/23 1412   And   LORazepam (ATIVAN) injection 2 mg  2 mg Intramuscular TID PRN  McLauchlin, [name], NP   2 mg at 08/04/23 1412   haloperidol lactate (HALDOL) injection 10 mg  10 mg Intramuscular TID PRN McLauchlin, [name], NP       And   diphenhydrAMINE (BENADRYL) injection 50 mg  50 mg Intramuscular TID PRN McLauchlin, [name], NP       And   LORazepam (ATIVAN) injection 2 mg  2 mg Intramuscular TID PRN McLauchlin, [name], NP       haloperidol lactate (HALDOL) injection 5 mg  5 mg Intramuscular TID PRN [name], NP       And   diphenhydrAMINE (BENADRYL) injection 50 mg  50 mg Intramuscular TID PRN [name], NP       And   LORazepam (ATIVAN) injection 2 mg  2 mg Intramuscular TID PRN [name], NP       haloperidol lactate (HALDOL) injection 10 mg  10 mg Intramuscular TID PRN [name], NP   10 mg at 08/17/23 1800   And   diphenhydrAMINE (BENADRYL) injection 50 mg  50 mg Intramuscular TID PRN [name], NP   50 mg at 08/17/23 1800   And   LORazepam (ATIVAN) injection 2 mg  2 mg Intramuscular TID PRN [name], NP   2 mg at 08/17/23 1801  divalproex (DEPAKOTE ER) 24 hr tablet 500 mg  500 mg Oral BID [name], NP   500 mg at 08/23/23 4098   docusate sodium (COLACE) capsule 200 mg  200 mg Oral Daily [name], [name], NP   200 mg at 08/23/23 0858   dolutegravir (TIVICAY) tablet 50 mg  50 mg Oral Daily [name], NP   50 mg at 08/23/23 1191   EPINEPHrine (EPI-PEN) injection 0.3 mg  0.3 mg Intramuscular Once [name], NP       famotidine (PEPCID) tablet 20 mg  20 mg Oral Daily [name], NP   20 mg at 08/23/23 0858   feeding supplement (GLUCERNA SHAKE) (GLUCERNA SHAKE) liquid 237 mL  237 mL Oral TID WC [name], NP   237 mL at 08/23/23 1144   FLUoxetine (PROZAC) capsule 20 mg  20 mg Oral Daily [name], NP   20 mg at 08/23/23 0858   hydrocortisone (ANUSOL-HC) 2.5 % rectal cream   Rectal BID [name], MD   Given at 08/23/23 0902   hydrOXYzine (ATARAX) tablet 50 mg  50 mg Oral Q6H  PRN [name], NP   50 mg at 08/17/23 0856   ibuprofen (ADVIL) tablet 600 mg  600 mg Oral Q8H PRN [name], NP   600 mg at 08/17/23 1233   influenza vac split trivalent PF (FLULAVAL) injection 0.5 mL  0.5 mL Intramuscular Tomorrow-1000 [name], MD       insulin aspart (novoLOG) injection 0-9 Units  0-9 Units Subcutaneous TID WC [name], NP   1 Units at 08/23/23 4782   linaclotide (LINZESS) capsule 290 mcg  290 mcg Oral QAC breakfast [name], NP   290 mcg at 08/23/23 9562   magnesium hydroxide (MILK OF MAGNESIA) suspension 30 mL  30 mL Oral Daily PRN McLauchlin, [name], NP   30 mL at 08/17/23 2109   melatonin tablet 5 mg  5 mg Oral QHS [name], NP   5 mg at 08/22/23 2103   menthol-cetylpyridinium (CEPACOL) lozenge 3 mg  1 lozenge Oral PRN Tingling, [name], [name]   3 mg at 08/22/23 2148   metFORMIN (GLUCOPHAGE) tablet 500 mg  500 mg Oral BID WC [name], NP   500 mg at 08/23/23 1308   methocarbamol (ROBAXIN) tablet 500 mg  500 mg Oral QHS [name], NP   500 mg at 08/22/23 2104   montelukast (SINGULAIR) tablet 5 mg  5 mg Oral QHS [name], [name], DO   5 mg at 08/22/23 2103   nicotine polacrilex (NICORETTE) gum 4 mg  4 mg Oral PRN [name], NP   4 mg at 08/23/23 0903   ondansetron (ZOFRAN) tablet 4 mg  4 mg Oral Q8H PRN [name], NP   4 mg at 08/17/23 1233   polyethylene glycol (MIRALAX / GLYCOLAX) packet 17 g  17 g Oral Daily [name], [name], NP   17 g at 08/22/23 1036   prazosin (MINIPRESS) capsule 10 mg  10 mg Oral QHS [name], NP   10 mg at 08/22/23 2103   traZODone (DESYREL) tablet 50 mg  50 mg Oral QHS PRN McLauchlin, [name], NP   50 mg at 08/22/23 2104   PTA Medications: Medications Prior to Admission  Medication Sig Dispense Refill Last Dose/Taking   albuterol (VENTOLIN HFA) 108 (90 Base) MCG/ACT inhaler Inhale 2 puffs into the lungs every 4 (  four) hours as needed for wheezing or shortness of breath.   Taking As Needed    ARIPiprazole (ABILIFY) 2 MG tablet Take 1 tablet (2 mg total) by mouth daily. 12 tablet 0 07/17/2023 at  8:00 AM   ARIPiprazole ER (ABILIFY MAINTENA) 400 MG SRER injection Inject 1.5 mLs (300 mg total) into the muscle every 28 (twenty-eight) days. 1.5 mL 0 06/30/2023   benztropine (COGENTIN) 1 MG tablet Take 1 tablet (1 mg total) by mouth 2 (two) times daily. 60 tablet 0 07/17/2023 at  8:00 PM   darunavir-cobicistat (PREZCOBIX) 800-150 MG tablet Take 1 tablet by mouth daily with breakfast. Swallow whole. Do NOT crush, break or chew tablets. Take with food. 30 tablet 1 07/17/2023 at  8:00 AM   divalproex (DEPAKOTE ER) 250 MG 24 hr tablet Take 1 tablet (250 mg total) by mouth 2 (two) times daily. 60 tablet 0 07/17/2023 at  8:00 PM   docusate sodium (COLACE) 100 MG capsule Take 300 mg by mouth daily.   07/17/2023 at  8:00 AM   dolutegravir (TIVICAY) 50 MG tablet Take 1 tablet (50 mg total) by mouth daily. 30 tablet 1 07/17/2023 at  8:00 AM   EPINEPHrine 0.3 mg/0.3 mL IJ SOAJ injection Inject 0.3 mg into the muscle as needed for anaphylaxis.   Taking As Needed   famotidine (PEPCID) 20 MG tablet Take 20 mg by mouth 2 (two) times daily.   Taking   FIBER ADULT GUMMIES PO Take 1 tablet by mouth in the morning and at bedtime.   07/17/2023 at  8:00 PM   FLUoxetine (PROZAC) 40 MG capsule Take 40 mg by mouth at bedtime.   07/17/2023 at  8:00 PM   glycerin adult 2 g suppository Place 1 suppository rectally as needed for constipation.   Taking As Needed   hydrOXYzine (ATARAX) 25 MG tablet Take 1 tablet (25 mg total) by mouth 3 (three) times daily as needed. 60 tablet 1 Taking As Needed   ibuprofen (ADVIL) 800 MG tablet Take 1 tablet (800 mg total) by mouth every 8 (eight) hours as needed for moderate pain. 15 tablet 0 Taking As Needed   ketoconazole (NIZORAL) 2 % shampoo Apply 1 Application topically. Monday/ Wednesday/ Friday   07/17/2023 at  3:00 PM   linaclotide (LINZESS) 290 MCG CAPS capsule Take 290 mcg by mouth daily  before breakfast.   07/17/2023 at  7:00 AM   magnesium citrate SOLN Take 1 Bottle by mouth daily as needed for severe constipation.   Taking As Needed   melatonin 5 MG TABS Take 0.5 tablets (2.5 mg total) by mouth at bedtime. 15 tablet 0 07/17/2023 at  8:00 PM   methocarbamol (ROBAXIN) 500 MG tablet Take 500 mg by mouth at bedtime.   Taking   montelukast (SINGULAIR) 10 MG tablet Take 10 mg by mouth daily.   07/17/2023 at  8:00 AM   nicotine (NICODERM CQ - DOSED IN MG/24 HR) 7 mg/24hr patch Place 1 patch (7 mg total) onto the skin daily. 360 patch 0 07/17/2023 at  8:00 AM   nicotine polacrilex (NICORETTE) 2 MG gum Take 2 mg by mouth as needed for smoking cessation.   Taking As Needed   nicotine polacrilex (NICOTINE MINI) 4 MG lozenge Take 1 lozenge (4 mg total) by mouth as needed. 100 tablet 0 Taking As Needed   ondansetron (ZOFRAN-ODT) 4 MG disintegrating tablet Take 1 tablet (4 mg total) by mouth every 8 (eight) hours as needed for nausea or  vomiting. 20 tablet 0 Taking As Needed   prazosin (MINIPRESS) 5 MG capsule Take 1 capsule (5 mg total) by mouth at bedtime. 30 capsule 1 07/17/2023 at  8:00 PM   risperiDONE (RISPERDAL) 0.5 MG tablet Take 0.5 mg by mouth 2 (two) times daily. 0800/1600   07/17/2023 at  4:00 PM   topiramate (TOPAMAX) 50 MG tablet Take 1 tablet (50 mg total) by mouth 2 (two) times daily. (Patient taking differently: Take 50 mg by mouth at bedtime.) 60 tablet 1 07/17/2023 at  8:00 PM   traZODone (DESYREL) 50 MG tablet Take 50 mg by mouth at bedtime.   07/17/2023 at  8:00 PM   etonogestrel (NEXPLANON) 68 MG IMPL implant 1 each (68 mg total) by Subdermal route once for 1 dose. 1 each 0     Patient Stressors:    Patient Strengths:    Treatment Modalities: Medication Management, Group therapy, Case management,  1 to 1 session with clinician, Psychoeducation, Recreational therapy.   Physician Treatment Plan for Primary Diagnosis: Bipolar I disorder, most recent episode mixed (HCC) Long  Term Goal(s): Improvement in symptoms so as ready for discharge   Short Term Goals: Ability to identify changes in lifestyle to reduce recurrence of condition will improve Ability to verbalize feelings will improve Ability to disclose and discuss suicidal ideas Ability to demonstrate self-control will improve Ability to identify and develop effective coping behaviors will improve Ability to maintain clinical measurements within normal limits will improve Compliance with prescribed medications will improve Ability to identify triggers associated with substance abuse/mental health issues will improve  Medication Management: Evaluate patient's response, side effects, and tolerance of medication regimen.  Therapeutic Interventions: 1 to 1 sessions, Unit Group sessions and Medication administration.  Evaluation of Outcomes: Progressing  Physician Treatment Plan for Secondary Diagnosis: Principal Problem:   Bipolar I disorder, most recent episode mixed (HCC) Active Problems:   PTSD (post-traumatic stress disorder)   HIV disease (HCC)   Thrombosed hemorrhoids   Abdominal pain  Long Term Goal(s): Improvement in symptoms so as ready for discharge   Short Term Goals: Ability to identify changes in lifestyle to reduce recurrence of condition will improve Ability to verbalize feelings will improve Ability to disclose and discuss suicidal ideas Ability to demonstrate self-control will improve Ability to identify and develop effective coping behaviors will improve Ability to maintain clinical measurements within normal limits will improve Compliance with prescribed medications will improve Ability to identify triggers associated with substance abuse/mental health issues will improve     Medication Management: Evaluate patient's response, side effects, and tolerance of medication regimen.  Therapeutic Interventions: 1 to 1 sessions, Unit Group sessions and Medication  administration.  Evaluation of Outcomes: Progressing   RN Treatment Plan for Primary Diagnosis: Bipolar I disorder, most recent episode mixed (HCC) Long Term Goal(s): Knowledge of disease and therapeutic regimen to maintain health will improve  Short Term Goals: Ability to demonstrate self-control, Ability to participate in decision making will improve, Ability to verbalize feelings will improve, Ability to disclose and discuss suicidal ideas, Ability to identify and develop effective coping behaviors will improve, and Compliance with prescribed medications will improve  Medication Management: RN will administer medications as ordered by provider, will assess and evaluate patient's response and provide education to patient for prescribed medication. RN will report any adverse and/or side effects to prescribing provider.  Therapeutic Interventions: 1 on 1 counseling sessions, Psychoeducation, Medication administration, Evaluate responses to treatment, Monitor vital signs and CBGs as ordered, Perform/monitor  CIWA, COWS, AIMS and Fall Risk screenings as ordered, Perform wound care treatments as ordered.  Evaluation of Outcomes: Progressing   LCSW Treatment Plan for Primary Diagnosis: Bipolar I disorder, most recent episode mixed (HCC) Long Term Goal(s): Safe transition to appropriate next level of care at discharge, Engage patient in therapeutic group addressing interpersonal concerns.  Short Term Goals: Engage patient in aftercare planning with referrals and resources, Increase social support, Increase ability to appropriately verbalize feelings, Increase emotional regulation, Facilitate acceptance of mental health diagnosis and concerns, and Increase skills for wellness and recovery  Therapeutic Interventions: Assess for all discharge needs, 1 to 1 time with Social worker, Explore available resources and support systems, Assess for adequacy in community support network, Educate family and  significant other(s) on suicide prevention, Complete Psychosocial Assessment, Interpersonal group therapy.  Evaluation of Outcomes: Progressing   Progress in Treatment: Attending groups: Yes. Participating in groups: Yes. Taking medication as prescribed: Yes. Toleration medication: Yes. Family/Significant other contact made: Yes, individual(s) contacted:  SPE completed with the patient's guardian. Patient understands diagnosis: No. Discussing patient identified problems/goals with staff: Yes. Medical problems stabilized or resolved: Yes. Denies suicidal/homicidal ideation: Yes. Issues/concerns per patient self-inventory: Yes. Other: none  New problem(s) identified:  No, Describe:  none identified. Update 08/03/2023:  No changes at this time. Update 08/08/2023:  No changes at this time. Update 08/13/2023: No changes at this time. Update 08/18/23: Patient has been navigating her complex feelings while interviewing for placement. Update 08/23/2023:  No changes at this time.    New Short Term/Long Term Goal(s): medication management for mood stabilization; elimination of SI thoughts; development of comprehensive mental wellness plan. Update 07/24/23: No changes at this time. Update: 07/29/2023: No changes at this time.  Update 08/03/2023:  No changes at this time. Update 08/08/2023:  No changes at this time.  Update 08/13/2023: No changes at this time. Update 08/18/2023: No changes at this time. Update 08/23/2023:  No changes at this time.    Patient Goals:  Pt declined to participate in treatment team meeting. Update 07/24/23: No changes at this time.  Update 08/03/2023:  No changes at this time. Update 08/08/2023:  No changes at this time.   Update 08/13/2023: No changes at this time. Update 08/18/2023: No changes at this time.  Update 08/23/2023:  No changes at this time.    Discharge Plan or Barriers:  CSW will assist pt/guardian with development of an appropriate aftercare/discharge plan. Update 07/24/23:  Guardian notified team that pt will not be returning to group home. Update: 07/29/2023: No changes at this time.  Update 08/03/2023:  Patient's guardian continues to look for placement for the patient.  Patient remains safe on the unit. Update 08/08/2023:  Patient continues to wait for placement.  CSW has sent FL2 to the patient's guardian to assist in bed placement.   Update 08/13/2023: The patient interview for [facility] was not successful, she has interviews with her guardian and other [facility] providers coming up. Update 08/18/2023: Patient has had interview with [facility] provider. Team is still awaiting facility's decision. Update 08/23/2023:  Potential placement has been located for patient.  Barriers have arisen due to communication barriers with the prospective [facility] provider.  CSW is working on plan to address these.   Reason for Continuation of Hospitalization:  Aggression Depression Medication stabilization Suicidal ideation   Estimated Length of Stay: 1-7 days Update 07/24/23: No changes at this time. Update: 07/29/2023: No changes at this time  Update 08/03/2023:  TBD Update 08/08/2023:  TBD  Update 08/13/2023: TBD  Update 08/18/2023: TBD Update 08/23/2023:  TBD  Last 3 [place] Suicide Severity Risk Score: Flowsheet Row Admission (Current) from 07/18/2023 in [facility]PAT[facility] ED from 07/11/2023 in [facility] Department at [facility] Admission (Discharged) from 07/05/2023 in [facility] INPATIENT BE[facility]AL MEDICINE  C-SSRS RISK CATEGORY High Risk Error: Q3, 4, or 5 should not be populated when Q2 is No High Risk       Last PHQ 2/9 Scores:    05/04/2023    9:21 AM 02/16/2023    9:49 AM 11/17/2022   10:53 AM  Depression screen PHQ 2/9  Decreased Interest 0 0 0  Down, Depressed, Hopeless 0 0 1  PHQ - 2 Score 0 0 1    Scribe for Treatment Team: Harden Mo, [place] 08/23/2023 3:21 PM

## 2023-08-23 NOTE — Progress Notes (Signed)
 Pt visible in milieu, took scheduled med.  Denied SI/HI, AVH, anxiety and depression.      08/22/23 2300  Psych Admission Type (Psych Patients Only)  Admission Status Voluntary  Psychosocial Assessment  Patient Complaints None  Eye Contact Fair  Facial Expression Animated  Affect Appropriate to circumstance  Speech Logical/coherent  Interaction Assertive  Motor Activity Slow  Appearance/Hygiene Unremarkable  Behavior Characteristics Cooperative  Mood Pleasant  Thought Process  Coherency WDL  Content WDL  Delusions None reported or observed  Perception WDL  Hallucination None reported or observed  Judgment Limited  Confusion None  Danger to Self  Current suicidal ideation? Denies  Agreement Not to Harm Self Yes  Description of Agreement Verbal  Danger to Others  Danger to Others None reported or observed  Danger to Others Abnormal  Harmful Behavior to others No threats or harm toward other people  Destructive Behavior No threats or harm toward property

## 2023-08-23 NOTE — Group Note (Signed)
 Date:  08/23/2023 Time:  10:10 AM  Group Topic/Focus:  Making Healthy Choices:   The focus of this group is to help patients identify negative/unhealthy choices they were using prior to admission and identify positive/healthier coping strategies to replace them upon discharge. Self Care:   The focus of this group is to help patients understand the importance of self-care in order to improve or restore emotional, physical, spiritual, interpersonal, and financial health. Spirituality:   The focus of this group is to discuss how one's spirituality can aide in recovery.    Participation Level:  Active  Participation Quality:  Appropriate  Affect:  Appropriate  Cognitive:  Alert, Appropriate, and Oriented  Insight: Appropriate  Engagement in Group:  Developing/Improving and Engaged  Modes of Intervention:  Activity, Discussion, Education, and Socialization  Additional Comments:    Rosaura Carpenter 08/23/2023, 10:10 AM

## 2023-08-24 DIAGNOSIS — F3160 Bipolar disorder, current episode mixed, unspecified: Secondary | ICD-10-CM | POA: Diagnosis not present

## 2023-08-24 LAB — GLUCOSE, CAPILLARY
Glucose-Capillary: 131 mg/dL — ABNORMAL HIGH (ref 70–99)
Glucose-Capillary: 172 mg/dL — ABNORMAL HIGH (ref 70–99)
Glucose-Capillary: 111 mg/dL — ABNORMAL HIGH (ref 70–99)
Glucose-Capillary: 91 mg/dL (ref 70–99)
Glucose-Capillary: 78 mg/dL (ref 70–99)
Glucose-Capillary: 163 mg/dL — ABNORMAL HIGH (ref 70–99)

## 2023-08-24 NOTE — Plan of Care (Signed)
   Problem: Education: Goal: Knowledge of [place] [facility] Education information/materials will improve Outcome: Progressing Goal: Emotional status will improve Outcome: Progressing Goal: Mental status will improve Outcome: Progressing

## 2023-08-24 NOTE — Group Note (Signed)
 Date:  08/24/2023 Time:  9:50 PM  Group Topic/Focus:  Wrap-Up Group:   The focus of this group is to help patients review their daily goal of treatment and discuss progress on daily workbooks.    Participation Level:  Active  Participation Quality:  Appropriate, Attentive, Sharing, and Supportive  Affect:  Appropriate  Cognitive:  Appropriate  Insight: Appropriate and Good  Engagement in Group:  Engaged, Improving, and Supportive  Modes of Intervention:  Discussion, Socialization, and Support  Additional Comments:     [name] 08/24/2023, 9:50 PM

## 2023-08-24 NOTE — Plan of Care (Signed)
   Problem: Education: Goal: Knowledge of [place] [facility] Education information/materials will improve Outcome: Progressing Goal: Emotional status will improve Outcome: Progressing

## 2023-08-24 NOTE — [facility] Counselor (Signed)
 CSW received voicemail from [place], Merchandiser, retail, [phone], at [facility] containing contact information for Tomeka with [facility].   CSW called and thanked Lurena Joiner for the information.  CSW contacted Tomeka with [facility], [phone].  CSW inquired if Tomeka had received the email from this CSW.  Tomeka confirmed that she has received the email but has not had the opportunity to respond as of yet.  She reports that she would like for the [facility] owner to meet with patient face to face to ascertain if she can handle the behaviors documented on the patient.  She reports that she will reach out to Jeannette, the [facility] owner and call this Clinical research associate back.  Situation ongoing.  [name], MSW, LCSW 08/24/2023 9:00 AM

## 2023-08-24 NOTE — Group Note (Signed)
 Recreation Therapy Group Note   Group Topic:Health and Wellness  Group Date: 08/24/2023 Start Time: 1515 End Time: 1615 Facilitators: Rosina Lowenstein, LRT, CTRS Location: Courtyard  Group Description: [employer]. LRT and patients played games of basketball, drew with chalk, and played corn hole while outside in the courtyard while getting fresh air and sunlight. Music was being played in the background. LRT and peers conversed about different games they have played before, what they do in their free time and anything else that is on their minds. LRT encouraged pts to drink water after being outside, sweating and getting their heart rate up.  Goal Area(s) Addressed: Patient will build on frustration tolerance skills. Patients will partake in a competitive play game with peers. Patients will gain knowledge of new leisure interest/hobby.    Affect/Mood: N/A   Participation Level: Did not attend    Clinical Observations/Individualized Feedback: Patient did not attend group.   Plan: Continue to engage patient in RT group sessions 2-3x/week.   [address] LRT, CTRS 08/24/2023 5:10 PM

## 2023-08-24 NOTE — Progress Notes (Signed)
   08/24/23 0500  Psych Admission Type (Psych Patients Only)  Admission Status Voluntary  Psychosocial Assessment  Patient Complaints None  Eye Contact Brief  Facial Expression Animated  Affect Appropriate to circumstance  Speech Logical/coherent  Interaction Assertive  Motor Activity Slow  Appearance/Hygiene Unremarkable  Behavior Characteristics Cooperative  Mood Pleasant  Aggressive Behavior  Effect No apparent injury  Thought Process  Coherency WDL  Content WDL  Delusions None reported or observed  Perception WDL  Hallucination None reported or observed  Judgment WDL  Confusion None  Danger to Self  Current suicidal ideation? Denies  Danger to Others  Danger to Others None reported or observed  Danger to Others Abnormal  Harmful Behavior to others No threats or harm toward other people  Destructive Behavior No threats or harm toward property

## 2023-08-24 NOTE — Group Note (Signed)
 LCSW Group Therapy Note  Group Date: 08/24/2023 Start Time: 1330 End Time: 1430   Type of Therapy and Topic:  Group Therapy: Anger Cues and Responses  Participation Level:  Did Not Attend   Description of Group:   In this group, patients learned how to recognize the physical, cognitive, emotional, and behavioral responses they have to anger-provoking situations.  They identified a recent time they became angry and how they reacted.  They analyzed how their reaction was possibly beneficial and how it was possibly unhelpful.  The group discussed a variety of healthier coping skills that could help with such a situation in the future.  Focus was placed on how helpful it is to recognize the underlying emotions to our anger, because working on those can lead to a more permanent solution as well as our ability to focus on the important rather than the urgent.  Therapeutic Goals: Patients will remember their last incident of anger and how they felt emotionally and physically, what their thoughts were at the time, and how they behaved. Patients will identify how their behavior at that time worked for them, as well as how it worked against them. Patients will explore possible new behaviors to use in future anger situations. Patients will learn that anger itself is normal and cannot be eliminated, and that healthier reactions can assist with resolving conflict rather than worsening situations.  Summary of Patient Progress:   Patient was about to attend group initially, however, left once she felt she may be triggered  Therapeutic Modalities:   Cognitive Behavioral Therapy    Harden Mo, LCSWA 08/24/2023  3:09 PM

## 2023-08-24 NOTE — BHH Counselor (Signed)
 CSW spoke with Tomeka with [facility] and with the [employer] owner Symone.  Face to face meeting was scheduled for Monday 08/28/2023 at 9AM.  CSW team has been made aware.  Contact information has been provided.   [name], MSW, LCSW 08/24/2023 10:01 AM

## 2023-08-24 NOTE — Progress Notes (Signed)
   08/24/23 0900  Psych Admission Type (Psych Patients Only)  Admission Status Voluntary  Psychosocial Assessment  Patient Complaints Anxiety (states his worried about another patient bothering her)  Eye Contact Other (Comment) (appropriate)  Facial Expression Animated  Affect Appropriate to circumstance  Speech Logical/coherent  Interaction Assertive  Motor Activity Other (Comment) (appropriate)  Appearance/Hygiene Unremarkable  Behavior Characteristics Cooperative  Mood Pleasant (pt states mood is way better than last night)  Thought Process  Coherency WDL  Content WDL  Delusions None reported or observed  Perception WDL  Hallucination None reported or observed  Judgment WDL  Confusion None  Danger to Self  Current suicidal ideation? Denies  Agreement Not to Harm Self Yes  Description of Agreement verbal  Danger to Others  Danger to Others None reported or observed
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
## 2023-08-24 NOTE — Group Note (Signed)
 Date:  08/24/2023 Time:  10:21 AM  Group Topic/Focus:  Goals Group:   The focus of this group is to help patients establish daily goals to achieve during treatment and discuss how the patient can incorporate goal setting into their daily lives to aide in recovery.    Participation Level:  Active  Participation Quality:  Appropriate  Affect:  Appropriate  Cognitive:  Appropriate  Insight: Appropriate  Engagement in Group:  Engaged  Modes of Intervention:  Discussion, Education, and Support  Additional Comments:    Lastacia Solum A Adil Tugwell 08/24/2023, 10:21 AM

## 2023-08-24 NOTE — Group Note (Signed)
 Recreation Therapy Group Note   Group Topic:Healthy Support Systems  Group Date: 08/24/2023 Start Time: 1000 End Time: 1110 Facilitators: Rosina Lowenstein, LRT, CTRS Location:  Craft Room  Group Description: Straw Bridge. In groups or individually, patients were given 10 plastic drinking straws and an equal length of masking tape. Using the materials provided, patients were instructed to build a free-standing bridge-like structure to suspend an everyday item (ex: deck of cards) off the floor or table surface. 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 LRT facilitated post-activity discussion reviewing the importance of having strong and healthy support systems in our lives. LRT discussed how the people in our lives serve as the tape and the deck of cards we placed on top of our straw structure are the stressors we face in daily life. LRT and pts discussed what happens in our life when things get too heavy for [place], and we don't have strong supports outside of the hospital. Pt shared 2 of their healthy supports in their life aloud in the group.   Goal Area(s) Addressed:  Patient will identify 2 healthy supports in their life. Patient will identify skills to successfully complete activity. Patient will identify correlation of this activity to life post-discharge.  Patient will build on frustration tolerance skills. Patient will increase team building and communication skills.    Affect/Mood: N/A   Participation Level: Did not attend    Clinical Observations/Individualized Feedback: Patient did not attend group.   Plan: Continue to engage patient in RT group sessions 2-3x/week.   Rosina Lowenstein, LRT, CTRS 08/24/2023 11:30 AM

## 2023-08-25 DIAGNOSIS — F3160 Bipolar disorder, current episode mixed, unspecified: Secondary | ICD-10-CM | POA: Diagnosis not present

## 2023-08-25 LAB — GLUCOSE, CAPILLARY
Glucose-Capillary: 142 mg/dL — ABNORMAL HIGH (ref 70–99)
Glucose-Capillary: 223 mg/dL — ABNORMAL HIGH (ref 70–99)
Glucose-Capillary: 166 mg/dL — ABNORMAL HIGH (ref 70–99)
Glucose-Capillary: 192 mg/dL — ABNORMAL HIGH (ref 70–99)
Glucose-Capillary: 227 mg/dL — ABNORMAL HIGH (ref 70–99)

## 2023-08-25 NOTE — Plan of Care (Signed)
   Problem: Education: Goal: Emotional status will improve Outcome: Progressing   Problem: Activity: Goal: Interest or engagement in activities will improve Outcome: Progressing

## 2023-08-25 NOTE — Group Note (Signed)
 Recreation Therapy Group Note   Group Topic:Leisure Education  Group Date: 08/25/2023 Start Time: 1000 End Time: 1100 Facilitators: Rosina Lowenstein, LRT, CTRS Location:  Craft Room  Group Description: Leisure. Patients were given the option to choose from singing karaoke, coloring mandalas, using oil pastels, journaling, or playing with play-doh. LRT and pts discussed the meaning of leisure, the importance of participating in leisure during their free time/when they're outside of the hospital, as well as how our leisure interests can also serve as coping skills.   Goal Area(s) Addressed:  Patient will identify a current leisure interest.  Patient will learn the definition of "leisure". Patient will practice making a positive decision. Patient will have the opportunity to try a new leisure activity. Patient will communicate with peers and LRT.    Affect/Mood: N/A   Participation Level: Did not attend    Clinical Observations/Individualized Feedback: Patient did not attend group.   Plan: Continue to engage patient in RT group sessions 2-3x/week.   Rosina Lowenstein, LRT, CTRS 08/25/2023 12:14 PM

## 2023-08-25 NOTE — Plan of Care (Signed)
   Problem: Education: Goal: Knowledge of [place] [facility] Education information/materials will improve Outcome: Progressing Goal: Emotional status will improve Outcome: Progressing

## 2023-08-25 NOTE — Progress Notes (Signed)
   08/25/23 1600  Psych Admission Type (Psych Patients Only)  Admission Status Voluntary  Psychosocial Assessment  Patient Complaints Anxiety  Eye Contact Brief  Facial Expression Animated  Affect Appropriate to circumstance  Speech Logical/coherent  Interaction Assertive  Motor Activity Slow  Appearance/Hygiene Unremarkable  Behavior Characteristics Cooperative  Mood Pleasant  Aggressive Behavior  Effect No apparent injury  Thought Process  Coherency WDL  Content WDL  Delusions None reported or observed  Perception WDL  Hallucination None reported or observed  Judgment WDL  Confusion None  Danger to Self  Current suicidal ideation? Denies (Denies)  Agreement Not to Harm Self Yes  Description of Agreement Verbal  Danger to Others  Danger to Others None reported or observed  Danger to Others Abnormal  Harmful Behavior to others No threats or harm toward other people  Destructive Behavior No threats or harm toward property

## 2023-08-25 NOTE — Group Note (Signed)
 Date:  08/25/2023 Time:  5:46 PM  Group Topic/Focus:  Building Self Esteem:   The Focus of this group is helping patients become aware of the effects of self-esteem on their lives, the things they and others do that enhance or undermine their self-esteem, seeing the relationship between their level of self-esteem and the choices they make and learning ways to enhance self-esteem. Coping With Mental Health Crisis:   The purpose of this group is to help patients identify strategies for coping with mental health crisis.  Group discusses possible causes of crisis and ways to manage them effectively.     Participation Level:  Active  Participation Quality:  Appropriate  Affect:  Appropriate  Cognitive:  Appropriate  Insight: Appropriate  Engagement in Group:  Engaged  Modes of Intervention:  Activity and Socialization  Additional Comments:    Stuart Guillen L Daily Doe 08/25/2023, 5:46 PM

## 2023-08-25 NOTE — Progress Notes (Signed)
   08/24/23 1953  Psych Admission Type (Psych Patients Only)  Admission Status Voluntary  Psychosocial Assessment  Patient Complaints Anxiety  Eye Contact Brief  Facial Expression Animated  Affect Appropriate to circumstance  Speech Logical/coherent  Interaction Assertive  Motor Activity Slow  Appearance/Hygiene Unremarkable  Behavior Characteristics Cooperative  Mood Pleasant  Aggressive Behavior  Effect No apparent injury  Thought Process  Coherency WDL  Content WDL  Delusions None reported or observed  Perception WDL  Hallucination None reported or observed  Judgment WDL  Confusion None  Danger to Self  Current suicidal ideation? Denies  Danger to Others  Danger to Others None reported or observed  Danger to Others Abnormal  Harmful Behavior to others No threats or harm toward other people  Destructive Behavior No threats or harm toward property

## 2023-08-25 NOTE — Group Note (Signed)
 Date:  08/25/2023 Time:  8:38 PM  Group Topic/Focus:  Spirituality:   The focus of this group is to discuss how one's spirituality can aide in recovery.    Participation Level:  Active  Participation Quality:  Appropriate, Attentive, and Sharing  Affect:  Appropriate  Cognitive:  Alert and Appropriate  Insight: Appropriate  Engagement in Group:  Developing/Improving and Engaged  Modes of Intervention:  Clarification, Discussion, Orientation, Rapport Building, and Support  Additional Comments:     Makayah Pauli 08/25/2023, 8:38 PM

## 2023-08-25 NOTE — Plan of Care (Signed)
  Problem: Activity: Goal: Sleeping patterns will improve Outcome: Progressing   Problem: Education: Goal: Knowledge of [place] [facility] Education information/materials will improve Outcome: Progressing Goal: Emotional status will improve Outcome: Progressing
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
## 2023-08-25 NOTE — Group Note (Signed)
 Recreation Therapy Group Note   Group Topic:Other  Group Date: 08/25/2023 Start Time: 1500 End Time: 1550 Facilitators: Rosina Lowenstein, LRT, CTRS Location: Courtyard  Group Description: [employer]. LRT and patients played games of basketball, drew with chalk, and played corn hole while outside in the courtyard while getting fresh air and sunlight. Music was being played in the background. LRT and peers conversed about different games they have played before, what they do in their free time and anything else that is on their minds. LRT encouraged pts to drink water after being outside, sweating and getting their heart rate up.  Goal Area(s) Addressed: Patient will build on frustration tolerance skills. Patients will partake in a competitive play game with peers. Patients will gain knowledge of new leisure interest/hobby.    Affect/Mood: Appropriate   Participation Level: Minimal    Clinical Observations/Individualized Feedback: Rosali came late to group and left early. Pt minimally interacted with peers and LRT while present.   Plan: Continue to engage patient in RT group sessions 2-3x/week.   Rosina Lowenstein, LRT, CTRS 08/25/2023 4:44 PM

## 2023-08-26 DIAGNOSIS — F3160 Bipolar disorder, current episode mixed, unspecified: Secondary | ICD-10-CM | POA: Diagnosis not present

## 2023-08-26 LAB — GLUCOSE, CAPILLARY
Glucose-Capillary: 175 mg/dL — ABNORMAL HIGH (ref 70–99)
Glucose-Capillary: 174 mg/dL — ABNORMAL HIGH (ref 70–99)
Glucose-Capillary: 134 mg/dL — ABNORMAL HIGH (ref 70–99)
Glucose-Capillary: 206 mg/dL — ABNORMAL HIGH (ref 70–99)

## 2023-08-26 NOTE — Group Note (Signed)
 Date:  08/26/2023 Time:  5:18 PM  Group Topic/Focus:  Activity Group: The focus of the group is to promote activity for the patients and encourage them to go outside to the courtyard to receive some exercise and some fresh air.    Participation Level:  Did Not Attend   Crystal Black Crystal Black 08/26/2023, 5:18 PM
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
## 2023-08-26 NOTE — Progress Notes (Signed)
   08/26/23 0511  Psych Admission Type (Psych Patients Only)  Admission Status Voluntary  Psychosocial Assessment  Patient Complaints Anxiety  Eye Contact Brief  Facial Expression Animated  Affect Appropriate to circumstance  Speech Logical/coherent  Interaction Assertive  Motor Activity Slow  Appearance/Hygiene Unremarkable  Behavior Characteristics Cooperative  Mood Pleasant  Aggressive Behavior  Effect No apparent injury  Thought Process  Coherency WDL  Content WDL  Delusions None reported or observed  Perception WDL  Hallucination None reported or observed  Judgment WDL  Confusion None  Danger to Self  Current suicidal ideation? Denies  Danger to Others  Danger to Others None reported or observed  Danger to Others Abnormal  Harmful Behavior to others No threats or harm toward other people  Destructive Behavior No threats or harm toward property

## 2023-08-26 NOTE — Progress Notes (Signed)
 Patient observed sitting in the bed eating a sandwich, she appear flat, thoughts organized, affect is congruent with mood, she denies SI/HI/AVH, sitter at bed will continue to monitor closely.

## 2023-08-26 NOTE — Group Note (Signed)
 Date:  08/26/2023 Time:  10:30 PM  Group Topic/Focus:  Self Care:   The focus of this group is to help patients understand the importance of self-care in order to improve or restore emotional, physical, spiritual, interpersonal, and financial health.    Participation Level:  Active  Participation Quality:  Appropriate  Affect:  Appropriate  Cognitive:  Appropriate  Insight: Appropriate  Engagement in Group:  Engaged  Modes of Intervention:  Activity  Additional Comments:    Ly Bacchi 08/26/2023, 10:30 PM

## 2023-08-26 NOTE — Group Note (Signed)
 Date:  08/26/2023 Time:  10:43 AM  Group Topic/Focus:  Music Therapy: The purpose of this group is to allow patients to enjoy soothing music of their choice.    Participation Level:  Did Not Attend  Participation Quality:    Affect:    Cognitive:    Insight:   Engagement in Group:    Modes of Intervention:    Additional Comments:  Did not attend   Esteban Kobashigawa T Noe Gens 08/26/2023, 10:43 AM

## 2023-08-26 NOTE — Progress Notes (Signed)
   08/26/23 0956  Psych Admission Type (Psych Patients Only)  Admission Status Voluntary  Psychosocial Assessment  Patient Complaints Anxiety  Eye Contact Brief  Facial Expression Animated  Affect Appropriate to circumstance  Speech Logical/coherent  Interaction Assertive  Motor Activity Slow  Appearance/Hygiene Unremarkable  Behavior Characteristics Cooperative  Mood Pleasant  Thought Process  Coherency WDL  Content WDL  Delusions None reported or observed  Perception WDL  Hallucination None reported or observed  Judgment WDL  Confusion None  Danger to Self  Current suicidal ideation? Denies  Agreement Not to Harm Self Yes  Description of Agreement verbal  Danger to Others  Danger to Others None reported or observed  Danger to Others Abnormal  Harmful Behavior to others No threats or harm toward other people  Destructive Behavior No threats or harm toward property   Continues on 1:1 line of site observation. No hyper/hypoglycemic symptoms noted. Tolerated all meals and medications.

## 2023-08-27 DIAGNOSIS — F3160 Bipolar disorder, current episode mixed, unspecified: Secondary | ICD-10-CM | POA: Diagnosis not present

## 2023-08-27 LAB — GLUCOSE, CAPILLARY
Glucose-Capillary: 129 mg/dL — ABNORMAL HIGH (ref 70–99)
Glucose-Capillary: 135 mg/dL — ABNORMAL HIGH (ref 70–99)
Glucose-Capillary: 175 mg/dL — ABNORMAL HIGH (ref 70–99)
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
## 2023-08-27 NOTE — Progress Notes (Signed)
 Pt calm and pleasant during assessment denying SI/HI/AVH. Pt on 1:1 while awake and had a sitter present with her until she fell asleep. Pt asked this writer if she could have her night medications early because she wanted to be well rested for  her interview with a group home tomorrow. This Clinical research associate agreed and allowed her to get her night mediations early. Pt is asleep and without complaint at this time

## 2023-08-27 NOTE — Group Note (Signed)
 Date:  08/27/2023 Time:  8:54 PM  Group Topic/Focus:  Wrap-Up Group:   The focus of this group is to help patients review their daily goal of treatment and discuss progress on daily workbooks.    Participation Level:  Did Not Attend  Lenore Cordia 08/27/2023, 8:54 PM

## 2023-08-27 NOTE — Plan of Care (Signed)
   Problem: Education: Goal: Knowledge of [place] [facility] Education information/materials will improve Outcome: Progressing   Problem: Activity: Goal: Interest or engagement in activities will improve Outcome: Progressing   Problem: Safety: Goal: Periods of time without injury will increase Outcome: Progressing

## 2023-08-27 NOTE — Progress Notes (Signed)
 D: The patient is alert and oriented. Denies experiencing anxiety or depression at the time of assessment, as well as any pain, suicidal or homicidal ideation, or auditory or visual hallucinations. Patient declined breakfast, however, consumed more than 50% of lunch and dinner.   A: Scheduled medications were administered per provider orders. Support and encouragement were provided, with frequent verbal contact. Routine safety checks were conducted every 15 minutes.  R: No adverse drug reactions were observed. The patient is agreeable to notifying staff of any safety concerns and remains compliant with medications. Patient Interacted appropriately with others on the unit, remains on 1:1 level of observation while out of bedroom, and safe at this time. Plan of care is ongoing.

## 2023-08-27 NOTE — Progress Notes (Signed)
   08/26/23 1942  Psych Admission Type (Psych Patients Only)  Admission Status Voluntary  Psychosocial Assessment  Patient Complaints Anxiety  Eye Contact Brief  Facial Expression Animated  Affect Appropriate to circumstance  Speech Logical/coherent  Interaction Assertive  Motor Activity Slow  Appearance/Hygiene Unremarkable  Behavior Characteristics Cooperative  Mood Pleasant  Aggressive Behavior  Effect No apparent injury  Thought Process  Coherency WDL  Content WDL  Delusions None reported or observed  Perception WDL  Hallucination None reported or observed  Judgment WDL  Confusion None  Danger to Self  Current suicidal ideation? Denies  Agreement Not to Harm Self Yes  Danger to Others  Danger to Others None reported or observed  Danger to Others Abnormal  Harmful Behavior to others No threats or harm toward other people  Destructive Behavior No threats or harm toward property

## 2023-08-27 NOTE — Plan of Care (Signed)
   Problem: Education: Goal: Knowledge of [place] [facility] Education information/materials will improve Outcome: Progressing Goal: Emotional status will improve Outcome: Progressing Goal: Mental status will improve Outcome: Progressing

## 2023-08-27 NOTE — Group Note (Signed)
 Date:  08/27/2023 Time:  12:09 PM  Group Topic/Focus:  Coping With Mental Health Crisis:   The purpose of this group is to help patients identify strategies for coping with mental health crisis.  Group discusses possible causes of crisis and ways to manage them effectively.    Participation Level:  Active  Participation Quality:  Appropriate  Affect:  Appropriate  Cognitive:  Appropriate  Insight: Appropriate  Engagement in Group:  Engaged  Modes of Intervention:  Activity  Additional Comments:    Crystal Black 08/27/2023, 12:09 PM

## 2023-08-28 DIAGNOSIS — F3160 Bipolar disorder, current episode mixed, unspecified: Secondary | ICD-10-CM | POA: Diagnosis not present

## 2023-08-28 LAB — GLUCOSE, CAPILLARY
Glucose-Capillary: 167 mg/dL — ABNORMAL HIGH (ref 70–99)
Glucose-Capillary: 86 mg/dL (ref 70–99)
Glucose-Capillary: 90 mg/dL (ref 70–99)
Glucose-Capillary: 149 mg/dL — ABNORMAL HIGH (ref 70–99)

## 2023-08-28 NOTE — BHH Counselor (Signed)
 CSW contacted Tomeka with [facility] [phone]) regarding decision.  Tomeka shared that they have to speak with the guardian regarding this. She shared that they would hopefully be speaking with guardian to set a specific date for pt arrival, this week. No other concerns expressed. Contact ended without incident.   Vilma Meckel. Algis Greenhouse, MSW, LCSW, LCAS 08/28/2023 4:30 PM

## 2023-08-28 NOTE — B[facility] Counselor (Signed)
 CSW spoke with Tomeka with [facility] [phone]) to confirm that [employer] owner Symone was still coming for face to face meeting. This was confirmed. No other concerns expressed. Contact ended without incident.   CSW spoke with Symone [phone]) and was able to provide directions to [facility] entrance. No other concerns expressed. Contact ended without incident.  Vilma Meckel. Algis Greenhouse, MSW, LCSW, LCAS 08/28/2023 9:17 AM

## 2023-08-28 NOTE — Plan of Care (Signed)
   Problem: Education: Goal: Knowledge of [place] [facility] Education information/materials will improve Outcome: Progressing Goal: Emotional status will improve Outcome: Progressing Goal: Mental status will improve Outcome: Progressing

## 2023-08-28 NOTE — Plan of Care (Signed)
 Marland Kitchen

## 2023-08-28 NOTE — Group Note (Signed)
 LCSW Group Therapy Note   Group Date: 08/28/2023 Start Time: 1300 End Time: 1418   Type of Therapy and Topic:  Group Therapy: Challenging Core Beliefs  Participation Level:  Did Not Attend  Description of Group:  Patients were educated about core beliefs and asked to identify one harmful core belief that they have. Patients were asked to explore from where those beliefs originate. Patients were asked to discuss how those beliefs make them feel and the resulting behaviors of those beliefs. They were then be asked if those beliefs are true and, if so, what evidence they have to support them. Lastly, group members were challenged to replace those negative core beliefs with helpful beliefs.   Therapeutic Goals:   1. Patient will identify harmful core beliefs and explore the origins of such beliefs. 2. Patient will identify feelings and behaviors that result from those core beliefs. 3. Patient will discuss whether such beliefs are true. 4.  Patient will replace harmful core beliefs with helpful ones.  Summary of Patient Progress:  Patient did not attend.   Therapeutic Modalities: Cognitive Behavioral Therapy; Solution-Focused Therapy   Lowry Ram, LCSWA 08/28/2023  2:18 PM

## 2023-08-28 NOTE — BH IP Treatment Plan (Addendum)
 Interdisciplinary Treatment and Diagnostic Plan Update  08/28/2023 Time of Session: 11:00 Crystal Black MRN: [id number]  Principal Diagnosis: Bipolar I disorder, most recent episode mixed (HCC)  Secondary Diagnoses: Principal Problem:   Bipolar I disorder, most recent episode mixed (HCC) Active Problems:   PTSD (post-traumatic stress disorder)   HIV disease (HCC)   Thrombosed hemorrhoids   Abdominal pain   Current Medications:  Current Facility-Administered Medications  Medication Dose Route Frequency Provider Last Rate Last Admin   albuterol (VENTOLIN HFA) 108 (90 Base) MCG/ACT inhaler 2 puff  2 puff Inhalation Q4H PRN [name], NP   2 puff at 08/23/23 1529   alum & mag hydroxide-simeth (MAALOX/MYLANTA) 200-200-20 MG/5ML suspension 30 mL  30 mL Oral Q4H PRN McLauchlin, [name], NP   30 mL at 08/02/23 2036   ARIPiprazole ER (ABILIFY MAINTENA) injection 300 mg  300 mg Intramuscular Q28 days [name], NP   300 mg at 08/07/23 1009   clonazePAM (KLONOPIN) disintegrating tablet 0.25 mg  0.25 mg Oral Daily [name], NP   0.25 mg at 08/28/23 0742   darunavir-cobicistat (PREZCOBIX) 800-150 MG per tablet 1 tablet  1 tablet Oral Q breakfast [name], NP   1 tablet at 08/28/23 4098   haloperidol (HALDOL) tablet 5 mg  5 mg Oral TID PRN McLauchlin, [name], NP       And   diphenhydrAMINE (BENADRYL) capsule 50 mg  50 mg Oral TID PRN McLauchlin, [name], NP       haloperidol (HALDOL) tablet 5 mg  5 mg Oral TID PRN [name], NP   5 mg at 08/14/23 1942   And   diphenhydrAMINE (BENADRYL) capsule 50 mg  50 mg Oral TID PRN [name], NP   50 mg at 08/14/23 1942   haloperidol lactate (HALDOL) injection 5 mg  5 mg Intramuscular TID PRN McLauchlin, [name], NP       And   diphenhydrAMINE (BENADRYL) injection 50 mg  50 mg Intramuscular TID PRN McLauchlin, [name], NP   50 mg at 08/04/23 1412   And   LORazepam (ATIVAN) injection 2 mg  2 mg Intramuscular TID PRN McLauchlin,  [name], NP   2 mg at 08/04/23 1412   haloperidol lactate (HALDOL) injection 10 mg  10 mg Intramuscular TID PRN McLauchlin, [name], NP       And   diphenhydrAMINE (BENADRYL) injection 50 mg  50 mg Intramuscular TID PRN McLauchlin, [name], NP       And   LORazepam (ATIVAN) injection 2 mg  2 mg Intramuscular TID PRN McLauchlin, [name], NP       haloperidol lactate (HALDOL) injection 5 mg  5 mg Intramuscular TID PRN [name], NP       And   diphenhydrAMINE (BENADRYL) injection 50 mg  50 mg Intramuscular TID PRN [name], NP       And   LORazepam (ATIVAN) injection 2 mg  2 mg Intramuscular TID PRN [name], NP       haloperidol lactate (HALDOL) injection 10 mg  10 mg Intramuscular TID PRN [name], NP   10 mg at 08/17/23 1800   And   diphenhydrAMINE (BENADRYL) injection 50 mg  50 mg Intramuscular TID PRN [name], NP   50 mg at 08/17/23 1800   And   LORazepam (ATIVAN) injection 2 mg  2 mg Intramuscular TID PRN [name], NP   2 mg at 08/17/23 1801  divalproex (DEPAKOTE ER) 24 hr tablet 500 mg  500 mg Oral BID [name], NP   500 mg at 08/28/23 1610   docusate sodium (COLACE) capsule 200 mg  200 mg Oral Daily [name], [name], NP   200 mg at 08/28/23 0742   dolutegravir (TIVICAY) tablet 50 mg  50 mg Oral Daily [name], NP   50 mg at 08/28/23 9604   EPINEPHrine (EPI-PEN) injection 0.3 mg  0.3 mg Intramuscular Once [name], NP       famotidine (PEPCID) tablet 20 mg  20 mg Oral Daily [name], NP   20 mg at 08/28/23 5409   feeding supplement (GLUCERNA SHAKE) (GLUCERNA SHAKE) liquid 237 mL  237 mL Oral TID WC [name], NP   237 mL at 08/28/23 0746   FLUoxetine (PROZAC) capsule 20 mg  20 mg Oral Daily [name], NP   20 mg at 08/28/23 8119   hydrocortisone (ANUSOL-HC) 2.5 % rectal cream   Rectal BID [name], MD   1 Application at 08/27/23 1724   hydrOXYzine (ATARAX) tablet 50 mg  50 mg Oral Q6H PRN  [name], NP   50 mg at 08/24/23 1478   ibuprofen (ADVIL) tablet 600 mg  600 mg Oral Q8H PRN [name], NP   600 mg at 08/17/23 1233   influenza vac split trivalent PF (FLULAVAL) injection 0.5 mL  0.5 mL Intramuscular Tomorrow-1000 [name], MD       insulin aspart (novoLOG) injection 0-9 Units  0-9 Units Subcutaneous TID WC [name], NP   2 Units at 08/28/23 2956   linaclotide (LINZESS) capsule 290 mcg  290 mcg Oral QAC breakfast [name], NP   290 mcg at 08/28/23 2130   magnesium hydroxide (MILK OF MAGNESIA) suspension 30 mL  30 mL Oral Daily PRN McLauchlin, [name], NP   30 mL at 08/17/23 2109   melatonin tablet 5 mg  5 mg Oral QHS [name], NP   5 mg at 08/27/23 1950   menthol-cetylpyridinium (CEPACOL) lozenge 3 mg  1 lozenge Oral PRN Tingling, [name], [name]   3 mg at 08/26/23 2112   metFORMIN (GLUCOPHAGE) tablet 500 mg  500 mg Oral BID WC [name], NP   500 mg at 08/28/23 8657   methocarbamol (ROBAXIN) tablet 500 mg  500 mg Oral QHS [name], NP   500 mg at 08/27/23 1951   montelukast (SINGULAIR) tablet 5 mg  5 mg Oral QHS [name], [name], DO   5 mg at 08/27/23 1950   nicotine polacrilex (NICORETTE) gum 4 mg  4 mg Oral PRN [name], NP   4 mg at 08/28/23 1249   ondansetron (ZOFRAN) tablet 4 mg  4 mg Oral Q8H PRN [name], NP   4 mg at 08/27/23 1903   polyethylene glycol (MIRALAX / GLYCOLAX) packet 17 g  17 g Oral Daily [name], [name], NP   17 g at 08/28/23 1331   prazosin (MINIPRESS) capsule 10 mg  10 mg Oral QHS [name], NP   10 mg at 08/27/23 1951   traZODone (DESYREL) tablet 50 mg  50 mg Oral QHS PRN McLauchlin, [name], NP   50 mg at 08/27/23 1951   PTA Medications: Medications Prior to Admission  Medication Sig Dispense Refill Last Dose/Taking   albuterol (VENTOLIN HFA) 108 (90 Base) MCG/ACT inhaler Inhale 2 puffs into the lungs every  4 (four) hours as needed for wheezing or shortness of breath.   Taking As Needed    ARIPiprazole (ABILIFY) 2 MG tablet Take 1 tablet (2 mg total) by mouth daily. 12 tablet 0 07/17/2023 at  8:00 AM   ARIPiprazole ER (ABILIFY MAINTENA) 400 MG SRER injection Inject 1.5 mLs (300 mg total) into the muscle every 28 (twenty-eight) days. 1.5 mL 0 06/30/2023   benztropine (COGENTIN) 1 MG tablet Take 1 tablet (1 mg total) by mouth 2 (two) times daily. 60 tablet 0 07/17/2023 at  8:00 PM   darunavir-cobicistat (PREZCOBIX) 800-150 MG tablet Take 1 tablet by mouth daily with breakfast. Swallow whole. Do NOT crush, break or chew tablets. Take with food. 30 tablet 1 07/17/2023 at  8:00 AM   divalproex (DEPAKOTE ER) 250 MG 24 hr tablet Take 1 tablet (250 mg total) by mouth 2 (two) times daily. 60 tablet 0 07/17/2023 at  8:00 PM   docusate sodium (COLACE) 100 MG capsule Take 300 mg by mouth daily.   07/17/2023 at  8:00 AM   dolutegravir (TIVICAY) 50 MG tablet Take 1 tablet (50 mg total) by mouth daily. 30 tablet 1 07/17/2023 at  8:00 AM   EPINEPHrine 0.3 mg/0.3 mL IJ SOAJ injection Inject 0.3 mg into the muscle as needed for anaphylaxis.   Taking As Needed   famotidine (PEPCID) 20 MG tablet Take 20 mg by mouth 2 (two) times daily.   Taking   FIBER ADULT GUMMIES PO Take 1 tablet by mouth in the morning and at bedtime.   07/17/2023 at  8:00 PM   FLUoxetine (PROZAC) 40 MG capsule Take 40 mg by mouth at bedtime.   07/17/2023 at  8:00 PM   glycerin adult 2 g suppository Place 1 suppository rectally as needed for constipation.   Taking As Needed   hydrOXYzine (ATARAX) 25 MG tablet Take 1 tablet (25 mg total) by mouth 3 (three) times daily as needed. 60 tablet 1 Taking As Needed   ibuprofen (ADVIL) 800 MG tablet Take 1 tablet (800 mg total) by mouth every 8 (eight) hours as needed for moderate pain. 15 tablet 0 Taking As Needed   ketoconazole (NIZORAL) 2 % shampoo Apply 1 Application topically. Monday/ Wednesday/ Friday   07/17/2023 at  3:00 PM   linaclotide (LINZESS) 290 MCG CAPS capsule Take 290 mcg by mouth daily  before breakfast.   07/17/2023 at  7:00 AM   magnesium citrate SOLN Take 1 Bottle by mouth daily as needed for severe constipation.   Taking As Needed   melatonin 5 MG TABS Take 0.5 tablets (2.5 mg total) by mouth at bedtime. 15 tablet 0 07/17/2023 at  8:00 PM   methocarbamol (ROBAXIN) 500 MG tablet Take 500 mg by mouth at bedtime.   Taking   montelukast (SINGULAIR) 10 MG tablet Take 10 mg by mouth daily.   07/17/2023 at  8:00 AM   nicotine (NICODERM CQ - DOSED IN MG/24 HR) 7 mg/24hr patch Place 1 patch (7 mg total) onto the skin daily. 360 patch 0 07/17/2023 at  8:00 AM   nicotine polacrilex (NICORETTE) 2 MG gum Take 2 mg by mouth as needed for smoking cessation.   Taking As Needed   nicotine polacrilex (NICOTINE MINI) 4 MG lozenge Take 1 lozenge (4 mg total) by mouth as needed. 100 tablet 0 Taking As Needed   ondansetron (ZOFRAN-ODT) 4 MG disintegrating tablet Take 1 tablet (4 mg total) by mouth every 8 (eight) hours as needed for nausea  or vomiting. 20 tablet 0 Taking As Needed   prazosin (MINIPRESS) 5 MG capsule Take 1 capsule (5 mg total) by mouth at bedtime. 30 capsule 1 07/17/2023 at  8:00 PM   risperiDONE (RISPERDAL) 0.5 MG tablet Take 0.5 mg by mouth 2 (two) times daily. 0800/1600   07/17/2023 at  4:00 PM   topiramate (TOPAMAX) 50 MG tablet Take 1 tablet (50 mg total) by mouth 2 (two) times daily. (Patient taking differently: Take 50 mg by mouth at bedtime.) 60 tablet 1 07/17/2023 at  8:00 PM   traZODone (DESYREL) 50 MG tablet Take 50 mg by mouth at bedtime.   07/17/2023 at  8:00 PM   etonogestrel (NEXPLANON) 68 MG IMPL implant 1 each (68 mg total) by Subdermal route once for 1 dose. 1 each 0     Patient Stressors:    Patient Strengths:    Treatment Modalities: Medication Management, Group therapy, Case management,  1 to 1 session with clinician, Psychoeducation, Recreational therapy.   Physician Treatment Plan for Primary Diagnosis: Bipolar I disorder, most recent episode mixed (HCC) Long  Term Goal(s): Improvement in symptoms so as ready for discharge   Short Term Goals: Ability to identify changes in lifestyle to reduce recurrence of condition will improve Ability to verbalize feelings will improve Ability to disclose and discuss suicidal ideas Ability to demonstrate self-control will improve Ability to identify and develop effective coping behaviors will improve Ability to maintain clinical measurements within normal limits will improve Compliance with prescribed medications will improve Ability to identify triggers associated with substance abuse/mental health issues will improve  Medication Management: Evaluate patient's response, side effects, and tolerance of medication regimen.  Therapeutic Interventions: 1 to 1 sessions, Unit Group sessions and Medication administration.  Evaluation of Outcomes: Progressing  Physician Treatment Plan for Secondary Diagnosis: Principal Problem:   Bipolar I disorder, most recent episode mixed (HCC) Active Problems:   PTSD (post-traumatic stress disorder)   HIV disease (HCC)   Thrombosed hemorrhoids   Abdominal pain  Long Term Goal(s): Improvement in symptoms so as ready for discharge   Short Term Goals: Ability to identify changes in lifestyle to reduce recurrence of condition will improve Ability to verbalize feelings will improve Ability to disclose and discuss suicidal ideas Ability to demonstrate self-control will improve Ability to identify and develop effective coping behaviors will improve Ability to maintain clinical measurements within normal limits will improve Compliance with prescribed medications will improve Ability to identify triggers associated with substance abuse/mental health issues will improve     Medication Management: Evaluate patient's response, side effects, and tolerance of medication regimen.  Therapeutic Interventions: 1 to 1 sessions, Unit Group sessions and Medication  administration.  Evaluation of Outcomes: Progressing   RN Treatment Plan for Primary Diagnosis: Bipolar I disorder, most recent episode mixed (HCC) Long Term Goal(s): Knowledge of disease and therapeutic regimen to maintain health will improve  Short Term Goals: Ability to demonstrate self-control, Ability to participate in decision making will improve, Ability to verbalize feelings will improve, Ability to disclose and discuss suicidal ideas, Ability to identify and develop effective coping behaviors will improve, and Compliance with prescribed medications will improve  Medication Management: RN will administer medications as ordered by provider, will assess and evaluate patient's response and provide education to patient for prescribed medication. RN will report any adverse and/or side effects to prescribing provider.  Therapeutic Interventions: 1 on 1 counseling sessions, Psychoeducation, Medication administration, Evaluate responses to treatment, Monitor vital signs and CBGs as ordered,  Perform/monitor CIWA, COWS, AIMS and Fall Risk screenings as ordered, Perform wound care treatments as ordered.  Evaluation of Outcomes: Progressing   LCSW Treatment Plan for Primary Diagnosis: Bipolar I disorder, most recent episode mixed (HCC) Long Term Goal(s): Safe transition to appropriate next level of care at discharge, Engage patient in therapeutic group addressing interpersonal concerns.  Short Term Goals: Engage patient in aftercare planning with referrals and resources, Increase social support, Increase ability to appropriately verbalize feelings, Increase emotional regulation, Facilitate acceptance of mental health diagnosis and concerns, and Increase skills for wellness and recovery  Therapeutic Interventions: Assess for all discharge needs, 1 to 1 time with Social worker, Explore available resources and support systems, Assess for adequacy in community support network, Educate family and  significant other(s) on suicide prevention, Complete Psychosocial Assessment, Interpersonal group therapy.  Evaluation of Outcomes: Progressing   Progress in Treatment: Attending groups: Yes. Participating in groups: Yes. Taking medication as prescribed: Yes. Toleration medication: Yes. Family/Significant other contact made: Yes, individual(s) contacted:  SPE completed with the patient's guardian.  Patient understands diagnosis: No. Discussing patient identified problems/goals with staff: Yes. Medical problems stabilized or resolved: Yes. Denies suicidal/homicidal ideation: Yes. Issues/concerns per patient self-inventory: Yes. Other: none  New problem(s) identified:  No, Describe:  none identified. Update 08/03/2023:  No changes at this time. Update 08/08/2023:  No changes at this time. Update 08/13/2023: No changes at this time. Update 08/18/23: Patient has been navigating her complex feelings while interviewing for placement. Update 08/23/2023:  No changes at this time. Update 08/28/23: No changes at this time.    New Short Term/Long Term Goal(s): medication management for mood stabilization; elimination of SI thoughts; development of comprehensive mental wellness plan. Update 07/24/23: No changes at this time. Update: 07/29/2023: No changes at this time.  Update 08/03/2023:  No changes at this time. Update 08/08/2023:  No changes at this time.  Update 08/13/2023: No changes at this time. Update 08/18/2023: No changes at this time. Update 08/23/2023:  No changes at this time. Update 08/28/23: No changes at this time.   Patient Goals:  Pt declined to participate in treatment team meeting. Update 07/24/23: No changes at this time.  Update 08/03/2023:  No changes at this time. Update 08/08/2023:  No changes at this time.   Update 08/13/2023: No changes at this time. Update 08/18/2023: No changes at this time.  Update 08/23/2023:  No changes at this time. Update 08/28/23: No changes at this time.   Discharge Plan  or Barriers:  CSW will assist pt/guardian with development of an appropriate aftercare/discharge plan. Update 07/24/23: Guardian notified team that pt will not be returning to group home. Update: 07/29/2023: No changes at this time.  Update 08/03/2023:  Patient's guardian continues to look for placement for the patient.  Patient remains safe on the unit. Update 08/08/2023:  Patient continues to wait for placement.  CSW has sent FL2 to the patient's guardian to assist in bed placement.   Update 08/13/2023: The patient interview for [facility] was not successful, she has interviews with her guardian and other AFL providers coming up. Update 08/18/2023: Patient has had interview with AFL provider. Team is still awaiting facility's decision. Update 08/23/2023:  Potential placement has been located for patient.  Barriers have arisen due to communication barriers with the prospective AFL provider.  CSW is working on plan to address these. Update 08/28/23: Potential placement face to face interview completed. Still awaiting concrete decision regarding placement.    Reason for Continuation of  Hospitalization:  Aggression Depression Medication stabilization Suicidal ideation   Estimated Length of Stay: 1-7 days Update 07/24/23: No changes at this time. Update: 07/29/2023: No changes at this time  Update 08/03/2023:  TBD Update 08/08/2023:  TBD  Update 08/13/2023: TBD  Update 08/18/2023: TBD Update 08/23/2023:  TBD Update 08/28/23: TBD  Last 3 [place] Suicide Severity Risk Score: Flowsheet Row Admission (Current) from 07/18/2023 in [facility] INPATIENT BEHAVIORAL MEDICINE ED from 07/11/2023 in [facility] Emergency Department at [facility] Admission (Discharged) from 07/05/2023 in [facility] INPATIENT BEHAVIORAL MEDICINE  C-SSRS RISK CATEGORY High Risk Error: Q3, 4, or 5 should not be populated when Q2 is No High Risk       Last PHQ 2/9 Scores:    05/04/2023    9:21 AM 02/16/2023    9:49 AM 11/17/2022   10:53 AM  Depression screen PHQ  2/9  Decreased Interest 0 0 0  Down, Depressed, Hopeless 0 0 1  PHQ - 2 Score 0 0 1    Scribe for Treatment Team: Glenis Smoker, LCSW 08/28/2023 1:38 PM
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
## 2023-08-28 NOTE — Group Note (Signed)
 Date:  08/28/2023 Time:  11:34 AM  Group Topic/Focus:  Goals Group:   The focus of this group is to help patients establish daily goals to achieve during treatment and discuss how the patient can incorporate goal setting into their daily lives to aide in recovery.  Participation Level:  Did Not Attend  Levander Katzenstein A Maggy Wyble 08/28/2023, 11:34 AM

## 2023-08-28 NOTE — Group Note (Signed)
 Date:  08/28/2023 Time:  8:41 PM  Group Topic/Focus:  Wrap-Up Group:   The focus of this group is to help patients review their daily goal of treatment and discuss progress on daily workbooks.    Participation Level:  Active  Participation Quality:  Appropriate  Affect:  Appropriate  Cognitive:  Appropriate  Insight: Appropriate  Engagement in Group:  Supportive  Modes of Intervention:  Discussion  Additional Comments:     [name] 08/28/2023, 8:41 PM

## 2023-08-28 NOTE — Progress Notes (Signed)
   08/28/23 0911  Psych Admission Type (Psych Patients Only)  Admission Status Voluntary  Psychosocial Assessment  Patient Complaints None  Eye Contact Brief  Facial Expression Worried  Affect Preoccupied  Speech Logical/coherent  Interaction Assertive  Motor Activity Slow  Appearance/Hygiene Unremarkable  Behavior Characteristics Cooperative  Mood Pleasant  Thought Process  Coherency WDL  Content WDL  Delusions None reported or observed  Perception WDL  Hallucination None reported or observed  Judgment WDL  Confusion None  Danger to Self  Current suicidal ideation? Denies  Agreement Not to Harm Self Yes  Description of Agreement verbal

## 2023-08-28 NOTE — [facility] Counselor (Signed)
 CSW sat with pt for face to face interview with Symone. Pt was appropriate and bright during the interaction. Contact ended without incident.   CSW phoned Symone [phone]) to inquire regarding any updates. She informed CSW that she had not heard back yet but was waiting for a call back. Medications were discussed briefly. No further questions/concerns presented. Contact ended without incident.   CSW team will continue to follow.   Vilma Meckel. Algis Greenhouse, MSW, LCSW, LCAS 08/28/2023 1:22 PM

## 2023-08-28 NOTE — Group Note (Signed)
 Recreation Therapy Group Note   Group Topic:Goal Setting  Group Date: 08/28/2023 Start Time: 1040 End Time: 1140 Facilitators: Rosina Lowenstein, LRT, CTRS Location:  Craft Room  Group Description: Product/process development scientist. Patients were given many different magazines, a glue stick, markers, and a piece of cardstock paper. LRT and pts discussed the importance of having goals in life. LRT and pts discussed the difference between short-term and long-term goals, as well as what a SMART goal is. LRT encouraged pts to create a vision board, with images they picked and then cut out with safety scissors from the magazine, for themselves, that capture their short and long-term goals. LRT encouraged pts to show and explain their vision board to the group.   Goal Area(s) Addressed:  Patient will gain knowledge of short vs. long term goals.  Patient will identify goals for themselves. Patient will practice setting SMART goals. Patient will verbalize their goals to LRT and peers.   Affect/Mood: N/A   Participation Level: Did not attend    Clinical Observations/Individualized Feedback: Patient did not attend group.   Plan: Continue to engage patient in RT group sessions 2-3x/week.   Rosina Lowenstein, LRT, CTRS 08/28/2023 1:09 PM

## 2023-08-29 DIAGNOSIS — F3160 Bipolar disorder, current episode mixed, unspecified: Secondary | ICD-10-CM | POA: Diagnosis not present

## 2023-08-29 LAB — GLUCOSE, CAPILLARY
Glucose-Capillary: 119 mg/dL — ABNORMAL HIGH (ref 70–99)
Glucose-Capillary: 164 mg/dL — ABNORMAL HIGH (ref 70–99)
Glucose-Capillary: 102 mg/dL — ABNORMAL HIGH (ref 70–99)
Glucose-Capillary: 158 mg/dL — ABNORMAL HIGH (ref 70–99)

## 2023-08-29 MED ORDER — MAGNESIUM CITRATE PO SOLN
1.0000 | Freq: Once | ORAL | Status: AC
  Administered 2023-08-29: 1 via ORAL
  Filled 2023-08-29: qty 296

## 2023-08-29 NOTE — Group Note (Signed)
 Recreation Therapy Group Note   Group Topic:Coping Skills  Group Date: 08/29/2023 Start Time: 1000 End Time: 1050 Facilitators: Rosina Lowenstein, LRT, CTRS Location:  Craft Room  Group Description: Mind Map.  Patient was provided a blank template of a diagram with 32 blank boxes in a tiered system, branching from the center (similar to a bubble chart). LRT directed patients to label the middle of the diagram "Coping Skills". LRT and patients then came up with 8 different coping skills as examples. Pt were directed to record their coping skills in the 2nd tier boxes closest to the center.  Patients would then share their coping skills with the group as LRT wrote them out. LRT gave a handout of 99 different coping skills at the end of group.   Goal Area(s) Addressed: Patients will be able to define "coping skills". Patient will identify new coping skills.  Patient will increase communication.   Affect/Mood: Appropriate   Participation Level: Active and Engaged   Participation Quality: Independent   Behavior: Appropriate, Calm, and Cooperative   Speech/Thought Process: Coherent   Insight: Good   Judgement: Good   Modes of Intervention: Clarification, Education, Exploration, Guided Discussion, Worksheet, and Writing   Patient Response to Interventions:  Attentive, Engaged, and Receptive   Education Outcome:  Acknowledges education   Clinical Observations/Individualized Feedback: Crystal Black was active in their participation of session activities and group discussion. Pt identified "4 wheeling and horseback riding" as coping skills. Pt spontaneously contributed to group discussion while interacting well with LRT and peers duration of session.    Plan: Continue to engage patient in RT group sessions 2-3x/week.   Rosina Lowenstein, LRT, CTRS 08/29/2023 12:09 PM

## 2023-08-29 NOTE — Plan of Care (Signed)
  Problem: Education: Goal: Knowledge of [facility] Education information/materials will improve Outcome: Progressing Goal: Emotional status will improve Outcome: Progressing Goal: Mental status will improve Outcome: Progressing Goal: Verbalization of understanding the information provided will improve Outcome: Progressing   Problem: Activity: Goal: Interest or engagement in activities will improve Outcome: Progressing Goal: Sleeping patterns will improve Outcome: Progressing   Problem: Coping: Goal: Ability to verbalize frustrations and anger appropriately will improve Outcome: Progressing Goal: Ability to demonstrate self-control will improve Outcome: Progressing   Problem: Health Behavior/Discharge Planning: Goal: Identification of resources available to assist in meeting health care needs will improve Outcome: Progressing Goal: Compliance with treatment plan for underlying cause of condition will improve Outcome: Progressing   Problem: Physical Regulation: Goal: Ability to maintain clinical measurements within normal limits will improve Outcome: Progressing   Problem: Safety: Goal: Periods of time without injury will increase Outcome: Progressing   Problem: Education: Goal: Utilization of techniques to improve thought processes will improve Outcome: Progressing Goal: Knowledge of the prescribed therapeutic regimen will improve Outcome: Progressing   Problem: Activity: Goal: Interest or engagement in leisure activities will improve Outcome: Progressing Goal: Imbalance in normal sleep/wake cycle will improve Outcome: Progressing   Problem: Coping: Goal: Coping ability will improve Outcome: Progressing Goal: Will verbalize feelings Outcome: Progressing   Problem: Health Behavior/Discharge Planning: Goal: Ability to make decisions will improve Outcome: Progressing Goal: Compliance with therapeutic regimen will improve Outcome: Progressing    Problem: Role Relationship: Goal: Will demonstrate positive changes in social behaviors and relationships Outcome: Progressing   Problem: Safety: Goal: Ability to disclose and discuss suicidal ideas will improve Outcome: Progressing Goal: Ability to identify and utilize support systems that promote safety will improve Outcome: Progressing   Problem: Self-Concept: Goal: Will verbalize positive feelings about self Outcome: Progressing Goal: Level of anxiety will decrease Outcome: Progressing   Problem: Education: Goal: Knowledge of General Education information will improve Description: Including pain rating scale, medication(s)/side effects and non-pharmacologic comfort measures Outcome: Progressing   Problem: Health Behavior/Discharge Planning: Goal: Ability to manage health-related needs will improve Outcome: Progressing   Problem: Education: Goal: Ability to describe self-care measures that may prevent or decrease complications (Diabetes Survival Skills Education) will improve Outcome: Progressing Goal: Individualized Educational Video(s) Outcome: Progressing   Problem: Coping: Goal: Ability to adjust to condition or change in health will improve Outcome: Progressing   Problem: Fluid Volume: Goal: Ability to maintain a balanced intake and output will improve Outcome: Progressing   Problem: Health Behavior/Discharge Planning: Goal: Ability to identify and utilize available resources and services will improve Outcome: Progressing Goal: Ability to manage health-related needs will improve Outcome: Progressing   Problem: Metabolic: Goal: Ability to maintain appropriate glucose levels will improve Outcome: Progressing   Problem: Nutritional: Goal: Maintenance of adequate nutrition will improve Outcome: Progressing Goal: Progress toward achieving an optimal weight will improve Outcome: Progressing   Problem: Skin Integrity: Goal: Risk for impaired skin integrity  will decrease Outcome: Progressing   Problem: Tissue Perfusion: Goal: Adequacy of tissue perfusion will improve Outcome: Progressing

## 2023-08-29 NOTE — Group Note (Signed)
 Date:  08/29/2023 Time:  10:01 AM  Group Topic/Focus:   Goals Group:   The focus of this group is to help patients establish daily goals to achieve during treatment and discuss how the patient can incorporate goal setting into their daily lives to aide in recovery.  Overcoming Stress:   The focus of this group is to define stress and help patients assess their triggers.   Participation Level:  Did Not Attend   Crystal Black A Iva Posten 08/29/2023, 10:01 AM

## 2023-08-29 NOTE — Plan of Care (Signed)
  Problem: Education: Goal: Emotional status will improve Outcome: Progressing Goal: Mental status will improve Outcome: Progressing   Problem: Activity: Goal: Interest or engagement in activities will improve Outcome: Progressing   Problem: Coping: Goal: Ability to verbalize frustrations and anger appropriately will improve Outcome: Progressing   Problem: Education: Goal: Ability to describe self-care measures that may prevent or decrease complications (Diabetes Survival Skills Education) will improve Outcome: Progressing

## 2023-08-29 NOTE — Plan of Care (Signed)
   Problem: Education: Goal: Knowledge of [place] [facility] Education information/materials will improve Outcome: Progressing Goal: Emotional status will improve Outcome: Progressing Goal: Mental status will improve Outcome: Progressing

## 2023-08-29 NOTE — Progress Notes (Signed)
 Nutrition Brief Note  Received consult for patient reporting "still hungry after meals". RD ordered for patient to receive double portions with meal trays. Notified RN. No further nutrition interventions needed at this time. Please re-consult if anything else needed.   Betsey Holiday MS, RD, LDN If unable to be reached, please send secure chat to "RD inpatient" available from 8:00a-4:00p daily

## 2023-08-29 NOTE — Progress Notes (Signed)
   08/29/23 2000  Psych Admission Type (Psych Patients Only)  Admission Status Voluntary  Psychosocial Assessment  Patient Complaints None  Eye Contact Brief  Facial Expression Animated  Affect Appropriate to circumstance  Speech Logical/coherent  Interaction Assertive  Motor Activity Slow  Appearance/Hygiene Unremarkable  Behavior Characteristics Cooperative  Mood Pleasant  Aggressive Behavior  Effect No apparent injury  Thought Process  Coherency WDL  Content WDL  Delusions None reported or observed  Perception WDL  Hallucination None reported or observed  Judgment WDL  Confusion None  Danger to Self  Current suicidal ideation? Denies  Agreement Not to Harm Self Yes  Description of Agreement verbal  Danger to Others  Danger to Others None reported or observed

## 2023-08-29 NOTE — Group Note (Signed)
 Date:  08/29/2023 Time:  4:43 PM  Group Topic/Focus:  Wellness Toolbox:   The focus of this group is to discuss various aspects of wellness, balancing those aspects and exploring ways to increase the ability to experience wellness.  Patients will create a wellness toolbox for use upon discharge.    Participation Level:  Active  Participation Quality:  Appropriate  Affect:  Appropriate  Cognitive:  Appropriate  Insight: Appropriate  Engagement in Group:  Engaged  Modes of Intervention:  Activity  Additional Comments:    Wilford Corner 08/29/2023, 4:43 PM

## 2023-08-29 NOTE — Group Note (Signed)
 Date:  08/29/2023 Time:  8:38 PM  Group Topic/Focus:  Wrap-Up Group:   The focus of this group is to help patients review their daily goal of treatment and discuss progress on daily workbooks.    Participation Level:  Active  Participation Quality:  Appropriate and Attentive  Affect:  Appropriate  Cognitive:  Alert  Insight: Appropriate  Engagement in Group:  Engaged  Modes of Intervention:  Discussion, Education, and Orientation  Additional Comments:     Maglione,Aliviana Burdell E 08/29/2023, 8:38 PM
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
## 2023-08-29 NOTE — Progress Notes (Signed)
 Patient denies SI/HI/AVH.  Denies anxiety or depression. Nicorette gum and Cepacol were given per pt request. She was concern about her interview yesterday. Spoke with Coralee North about it. She was engaged in the  therapy, she is pleasant and cooperative, no aggressive behavior observed, Q 15 safety checks per units protocol.

## 2023-08-29 NOTE — Group Note (Signed)
 [facility] LCSW Group Therapy Note   Group Date: 08/29/2023 Start Time: 1330 End Time: 1430  Type of Therapy/Topic:  Group Therapy:  Feelings about Diagnosis  Participation Level:  Did Not Attend   Description of Group:    This group will allow patients to explore their thoughts and feelings about diagnoses they have received. Patients will be guided to explore their level of understanding and acceptance of these diagnoses. Facilitator will encourage patients to process their thoughts and feelings about the reactions of others to their diagnosis, and will guide patients in identifying ways to discuss their diagnosis with significant others in their lives. This group will be process-oriented, with patients participating in exploration of their own experiences as well as giving and receiving support and challenge from other group members.   Therapeutic Goals: 1. Patient will demonstrate understanding of diagnosis as evidence by identifying two or more symptoms of the disorder:  2. Patient will be able to express two feelings regarding the diagnosis 3. Patient will demonstrate ability to communicate their needs through discussion and/or role plays  Summary of Patient Progress: Patient declined to attend group.  Therapeutic Modalities:   Cognitive Behavioral Therapy Brief Therapy Feelings Identification    Harden Mo, LCSW

## 2023-08-29 NOTE — Progress Notes (Signed)
 Pt calm and pleasant during assessment denying SI/HI/AVH. Pt on 1:1 while awake and had a sitter present with her until she fell asleep. Pt asked this writer if she could have her night medications early because she wanted to be well rested for  her interview with a group home tomorrow. This Clinical research associate agreed and allowed her to get her night mediations early. Pt is asleep and without complaint at this time

## 2023-08-30 DIAGNOSIS — F3160 Bipolar disorder, current episode mixed, unspecified: Secondary | ICD-10-CM | POA: Diagnosis not present

## 2023-08-30 LAB — GLUCOSE, CAPILLARY
Glucose-Capillary: 129 mg/dL — ABNORMAL HIGH (ref 70–99)
Glucose-Capillary: 126 mg/dL — ABNORMAL HIGH (ref 70–99)
Glucose-Capillary: 192 mg/dL — ABNORMAL HIGH (ref 70–99)
Glucose-Capillary: 175 mg/dL — ABNORMAL HIGH (ref 70–99)

## 2023-08-30 NOTE — Group Note (Signed)
 [facility] LCSW Group Therapy Note   Group Date: 08/30/2023 Start Time: 1400 End Time: 1450   Type of Therapy/Topic:  Group Therapy:  Emotion Regulation  Participation Level:  Did Not Attend    Description of Group:    The purpose of this group is to assist patients in learning to regulate negative emotions and experience positive emotions. Patients will be guided to discuss ways in which they have been vulnerable to their negative emotions. These vulnerabilities will be juxtaposed with experiences of positive emotions or situations, and patients challenged to use positive emotions to combat negative ones. Special emphasis will be placed on coping with negative emotions in conflict situations, and patients will process healthy conflict resolution skills.  Therapeutic Goals: Patient will identify two positive emotions or experiences to reflect on in order to balance out negative emotions:  Patient will label two or more emotions that they find the most difficult to experience:  Patient will be able to demonstrate positive conflict resolution skills through discussion or role plays:   Summary of Patient Progress: X   Therapeutic Modalities:   Cognitive Behavioral Therapy Feelings Identification Dialectical Behavioral Therapy   Glenis Smoker, LCSW

## 2023-08-30 NOTE — Progress Notes (Signed)
   08/30/23 1400  Psych Admission Type (Psych Patients Only)  Admission Status Voluntary  Psychosocial Assessment  Patient Complaints None  Eye Contact Brief  Facial Expression Animated  Affect Appropriate to circumstance  Speech Logical/coherent  Interaction Assertive;Needy  Motor Activity Slow  Appearance/Hygiene Unremarkable  Behavior Characteristics Cooperative  Mood Pleasant  Thought Process  Coherency WDL  Content WDL  Delusions None reported or observed  Perception WDL  Hallucination None reported or observed  Judgment WDL  Confusion None  Danger to Self  Current suicidal ideation? Denies
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
## 2023-08-30 NOTE — BHH Counselor (Signed)
 CSW sat in with pt for weekly meeting to touch base.   Attendees: [place] Pink, [facility] owner Ardyth Man, guardian [place], [facility] staff [place], [facility] Care Manager Di Kindle, [place] Nurse Manager Kreg Shropshire, TOC Supervisor Kathryne Eriksson [place] CNO  The meeting was held at 10:30 AM. During the meeting everyone was updated on progress including pt's in-person meeting with Symone from Monday, 08/28/23.   Next steps were discussed as [facility] Care Manager explained the process that would need to be completed. Attendees were informed that after paperwork is submitted it can take up to 14 days for them to get approval.   CSW was updated that a copy of pt's medication list and a TB skin test would be needed. [place] shared that they would need this at the latest prior to pt leaving the hospital.   Pt and guardian discussed her desire to have community network services but that she would also be open to a Day Program as well.   T[place]ka and other attendees discussed a tentative placement date for the week of the 18th, with a projected date of 21st for move in, dependent upon completion of process.   Aftercare was discussed. CSW informed them that aftercare would be arranged for continued medication management and therapy but this could also happen through day program potentially. It was requested that pt have a 30-day supply of medication to allow outpatient team to get pt connected with services, just in case. CSW was informed that any kind of medication that pt is on whether PRN or over-the-counter would need to have an order for it because otherwise they could not administer it. CSW agreed to update the provider.   Next meeting will be held on Wednesday, 09/06/23  at 10:30 AM. No other concerns expressed. Contact ended without incident.  CSW followed up with provider to get TB test ordered and they were updated regarding need for 30-day supply of medication and that anything that she would need PRN or over-the-counter would  need an order for it to be administered through [facility]. Provider in agreement.   CSW team will continue to follow.   Vilma Meckel. Algis Greenhouse, MSW, LCSW, LCAS 08/30/2023 1:05 PM

## 2023-08-30 NOTE — Group Note (Signed)
 Date:  08/30/2023 Time:  9:12 PM  Group Topic/Focus:  Healthy Communication:   The focus of this group is to discuss communication, barriers to communication, as well as healthy ways to communicate with others.    Participation Level:  Active  Participation Quality:  Appropriate  Affect:  Appropriate  Cognitive:  Appropriate  Insight: Appropriate  Engagement in Group:  Engaged  Modes of Intervention:  Activity  Additional Comments:    Phuong Moffatt 08/30/2023, 9:12 PM

## 2023-08-30 NOTE — Group Note (Signed)
 Date:  08/30/2023 Time:  1:46 PM  Group Topic/Focus:  Movie Group  The purpose of this group is to allow patients to watch a relaxing educational movie about coping with adult life and trials ongoing in adulthood.     Participation Level:  Active  Participation Quality:  Appropriate  Affect:  Appropriate  Cognitive:  Alert and Appropriate  Insight: Appropriate  Engagement in Group:  Engaged  Modes of Intervention:  Activity and Education  Additional Comments:    Marta Antu 08/30/2023, 1:46 PM

## 2023-08-30 NOTE — [facility] Counselor (Signed)
 CSW phoned guardian, [name] [phone]) to follow up regarding visitation tomorrow. She shared that she will be here around 3PM. No other concerns expressed. Contact ended without incident.   Vilma Meckel. Algis Greenhouse, MSW, LCSW, LCAS 08/30/2023 4:26 PM

## 2023-08-31 DIAGNOSIS — F3160 Bipolar disorder, current episode mixed, unspecified: Secondary | ICD-10-CM | POA: Diagnosis not present

## 2023-08-31 LAB — GLUCOSE, CAPILLARY
Glucose-Capillary: 108 mg/dL — ABNORMAL HIGH (ref 70–99)
Glucose-Capillary: 127 mg/dL — ABNORMAL HIGH (ref 70–99)
Glucose-Capillary: 161 mg/dL — ABNORMAL HIGH (ref 70–99)
Glucose-Capillary: 154 mg/dL — ABNORMAL HIGH (ref 70–99)

## 2023-08-31 NOTE — Plan of Care (Signed)
  Problem: Education: Goal: Knowledge of [facility] Education information/materials will improve Outcome: Progressing Goal: Emotional status will improve Outcome: Progressing Goal: Mental status will improve Outcome: Progressing Goal: Verbalization of understanding the information provided will improve Outcome: Progressing   Problem: Activity: Goal: Interest or engagement in activities will improve Outcome: Progressing Goal: Sleeping patterns will improve Outcome: Progressing   Problem: Coping: Goal: Ability to verbalize frustrations and anger appropriately will improve Outcome: Progressing   Problem: Safety: Goal: Periods of time without injury will increase Outcome: Progressing   Problem: Education: Goal: Utilization of techniques to improve thought processes will improve Outcome: Progressing

## 2023-08-31 NOTE — Group Note (Signed)
 Date:  08/31/2023 Time:  11:20 PM  Group Topic/Focus:  Wrap-Up Group:   The focus of this group is to help patients review their daily goal of treatment and discuss progress on daily workbooks.    Participation Level:  Did Not Attend   Lenore Cordia 08/31/2023, 11:20 PM
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
## 2023-08-31 NOTE — Group Note (Signed)
 Date:  08/31/2023 Time:  10:02 AM  Group Topic/Focus:  Crisis Planning:   The purpose of this group is to help patients create a crisis plan for use upon discharge or in the future, as needed.    Participation Level:  Did Not Attend   Crystal Black 08/31/2023, 10:02 AM

## 2023-08-31 NOTE — Group Note (Unsigned)
 [facility] LCSW Group Therapy Note    Group Date: 08/31/2023 Start Time: 1300 End Time: 1400  Type of Therapy and Topic:  Group Therapy:  Overcoming Obstacles  Participation Level:  {[facility] PARTICIPATION LEVEL:22264:::1}  Mood:  Description of Group:   In this group patients will be encouraged to explore what they see as obstacles to their own wellness and recovery. They will be guided to discuss their thoughts, feelings, and behaviors related to these obstacles. The group will process together ways to cope with barriers, with attention given to specific choices patients can make. Each patient will be challenged to identify changes they are motivated to make in order to overcome their obstacles. This group will be process-oriented, with patients participating in exploration of their own experiences as well as giving and receiving support and challenge from other group members.  Therapeutic Goals: 1. Patient will identify personal and current obstacles as they relate to admission. 2. Patient will identify barriers that currently interfere with their wellness or overcoming obstacles.  3. Patient will identify feelings, thought process and behaviors related to these barriers. 4. Patient will identify two changes they are willing to make to overcome these obstacles:    Summary of Patient Progress   ***   Therapeutic Modalities:   Cognitive Behavioral Therapy Solution Focused Therapy Motivational Interviewing Relapse Prevention Therapy   Lowry Ram, LCSW

## 2023-08-31 NOTE — Progress Notes (Addendum)
   08/31/23 1500  Psych Admission Type (Psych Patients Only)  Admission Status Voluntary  Psychosocial Assessment  Patient Complaints None  Eye Contact Fair  Facial Expression Animated  Affect Appropriate to circumstance  Speech Logical/coherent  Interaction Assertive  Motor Activity  (WNL)  Appearance/Hygiene Unremarkable  Behavior Characteristics Cooperative  Mood Pleasant  Thought Process  Coherency WDL  Content WDL  Delusions None reported or observed  Perception WDL  Hallucination None reported or observed  Judgment WDL  Confusion None  Danger to Self  Current suicidal ideation? Denies   Patient affect and mood remain approach, denies SI, plan or intent and was compliant with her medication regimen. Pt met with guardian this afternoon and became tearful after learning that the guardian was terminating care, pt tearful and processed appropriate. She continue to be managed on 1:1 while awake for safety.

## 2023-08-31 NOTE — [facility] Counselor (Signed)
 CSW sat with pt for meeting with guardian, [name].   Pt was informed that current guardian will be leaving the company and last day that she will be covering for pt will be Friday, 09/08/23.   Yesterday's meeting was discussed briefly and pt was asked for any questions that she may have. Pt shared that she was a bit confused about the community networking versus day programming. Guardian informed her that she could have a combination of both but she wanted to make sure that the community networking piece was for actual billable services. Pt also shared that she wanted to learn about money management. This was discussed briefly as well.   [name] inquired regarding pt person hygiene. Pt shared that she was not doing really well with this. She expressed that she was still missing her mother and acknowledged feeling a little depressed. Pt admitted that she has not been taking showers like she is supposed to.   Pt and [name] discussed getting her items from the group home. She shared that she would try to work this out. Pt stated that she would like to leave anything that could not be brought to the other person in the home. [name] shared that she would let the group home owner know this. No other concerns expressed. Contact ended without incident.   Vilma Meckel. Algis Greenhouse, MSW, LCSW, LCAS 08/31/2023 4:18 PM

## 2023-08-31 NOTE — Group Note (Signed)
 Recreation Therapy Group Note   Group Topic:Healthy Support Systems  Group Date: 08/31/2023 Start Time: 1000 End Time: 1050 Facilitators: Rosina Lowenstein, LRT, CTRS Location:  Craft Room  Group Description: Straw Bridge. Individually, patients were given 10 plastic drinking straws and an equal length of masking tape. Using the materials provided, patients were instructed to build a free-standing bridge-like structure to suspend an everyday item (ex: puzzle box) off the floor or table surface. 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 LRT facilitated post-activity discussion reviewing how we, humans, are like the structure we built; when things get too heavy in our life and we do not have adequate supports/coping skills, then we will fall just like the straw-built structure will. LRT focused on how having a "base" or structure on the bottom was necessary for the object to stand, meaning we must be secure and stable first before building on ourselves or others. Patients were encouraged to name 2 healthy supports in their life and reflect on how the skills used in this activity can be generalized to daily life post discharge.   Goal Area(s) Addressed:    Patient will identify two healthy support systems in their life.   Patient will work on Product manager.   Patient will verbalize the importance of having a strong and steady "base".    Patient will follow multi-step directions.   Patients will engage in creativity and use all provided materials.    Affect/Mood: N/A   Participation Level: Did not attend    Clinical Observations/Individualized Feedback: Patient did not attend group.   Plan: Continue to engage patient in RT group sessions 2-3x/week.   Rosina Lowenstein, LRT, CTRS 08/31/2023 11:59 AM

## 2023-09-01 DIAGNOSIS — F3160 Bipolar disorder, current episode mixed, unspecified: Secondary | ICD-10-CM | POA: Diagnosis not present

## 2023-09-01 LAB — GLUCOSE, CAPILLARY
Glucose-Capillary: 129 mg/dL — ABNORMAL HIGH (ref 70–99)
Glucose-Capillary: 109 mg/dL — ABNORMAL HIGH (ref 70–99)

## 2023-09-01 NOTE — Group Note (Signed)
 Date:  09/01/2023 Time:  9:23 PM  Group Topic/Focus:  Wrap-Up Group:   The focus of this group is to help patients review their daily goal of treatment and discuss progress on daily workbooks.    Participation Level:  Active  Participation Quality:  Appropriate  Affect:  Appropriate  Cognitive:  Alert  Insight: Good  Engagement in Group:  Engaged  Modes of Intervention:  Discussion  Additional Comments:    Maeola Harman 09/01/2023, 9:23 PM

## 2023-09-01 NOTE — Progress Notes (Signed)
 1:1 Patient Monitoring (while awake)  1100: Patient is in the dayroom, talking to staff and other members on the unit.  1200: Patient is in the dayroom, eating lunch, with other members on the unit and staff present.  1630: Patient is in the medication room, with this writer, getting her CBG checked before dinner.  1730: Patient is in her bedroom, playing with [employer] cards.  1830: Patient is in the dayroom, with staff and other members on the unit.

## 2023-09-01 NOTE — Plan of Care (Signed)
  Problem: Activity: Goal: Interest or engagement in activities will improve Outcome: Progressing   Problem: Coping: Goal: Ability to verbalize frustrations and anger appropriately will improve Outcome: Progressing   Problem: Health Behavior/Discharge Planning: Goal: Identification of resources available to assist in meeting health care needs will improve Outcome: Progressing   Problem: Safety: Goal: Periods of time without injury will increase Outcome: Progressing

## 2023-09-01 NOTE — Group Note (Signed)
 Recreation Therapy Group Note   Group Topic:Leisure Education  Group Date: 09/01/2023 Start Time: 1000 End Time: 1100 Facilitators: Rosina Lowenstein, LRT, CTRS Location:  Craft Room  Group Description: Leisure. Patients were given the option to choose from singing karaoke, coloring mandalas, using oil pastels, journaling, or playing with play-doh. LRT and pts discussed the meaning of leisure, the importance of participating in leisure during their free time/when they're outside of the hospital, as well as how our leisure interests can also serve as coping skills.   Goal Area(s) Addressed:  Patient will identify a current leisure interest.  Patient will learn the definition of "leisure". Patient will practice making a positive decision. Patient will have the opportunity to try a new leisure activity. Patient will communicate with peers and LRT.    Affect/Mood: N/A   Participation Level: Did not attend    Clinical Observations/Individualized Feedback: Patient did not attend group.   Plan: Continue to engage patient in RT group sessions 2-3x/week.   Rosina Lowenstein, LRT, CTRS 09/01/2023 11:54 AM

## 2023-09-01 NOTE — Progress Notes (Signed)
 Patient is asleep, and will be given scheduled medications once she wakes up. NP is aware.

## 2023-09-01 NOTE — Progress Notes (Signed)
 Patient presents pleasant but sad. Continues with 1:1 sitter while awake. Denies SI, HI, AVH. Appropriate with staff and peers. Visible in milieu, prn given for sleep with good relief. Encouragement and support provided. Safety checks maintained. Medications given as prescribed. Pt receptive and remains safe on unit with q 15 min checks.

## 2023-09-01 NOTE — Plan of Care (Signed)
  Problem: Education: Goal: Knowledge of [facility] Education information/materials will improve Outcome: Progressing Goal: Emotional status will improve Outcome: Progressing Goal: Mental status will improve Outcome: Progressing Goal: Verbalization of understanding the information provided will improve Outcome: Progressing   Problem: Activity: Goal: Interest or engagement in activities will improve Outcome: Progressing Goal: Sleeping patterns will improve Outcome: Progressing   Problem: Coping: Goal: Ability to verbalize frustrations and anger appropriately will improve Outcome: Progressing Goal: Ability to demonstrate self-control will improve Outcome: Progressing   Problem: Health Behavior/Discharge Planning: Goal: Identification of resources available to assist in meeting health care needs will improve Outcome: Progressing Goal: Compliance with treatment plan for underlying cause of condition will improve Outcome: Progressing   Problem: Physical Regulation: Goal: Ability to maintain clinical measurements within normal limits will improve Outcome: Progressing   Problem: Safety: Goal: Periods of time without injury will increase Outcome: Progressing   Problem: Education: Goal: Utilization of techniques to improve thought processes will improve Outcome: Progressing Goal: Knowledge of the prescribed therapeutic regimen will improve Outcome: Progressing   Problem: Activity: Goal: Interest or engagement in leisure activities will improve Outcome: Progressing Goal: Imbalance in normal sleep/wake cycle will improve Outcome: Progressing   Problem: Coping: Goal: Coping ability will improve Outcome: Progressing Goal: Will verbalize feelings Outcome: Progressing   Problem: Health Behavior/Discharge Planning: Goal: Ability to make decisions will improve Outcome: Progressing Goal: Compliance with therapeutic regimen will improve Outcome: Progressing    Problem: Role Relationship: Goal: Will demonstrate positive changes in social behaviors and relationships Outcome: Progressing   Problem: Safety: Goal: Ability to disclose and discuss suicidal ideas will improve Outcome: Progressing Goal: Ability to identify and utilize support systems that promote safety will improve Outcome: Progressing   Problem: Self-Concept: Goal: Will verbalize positive feelings about self Outcome: Progressing Goal: Level of anxiety will decrease Outcome: Progressing   Problem: Education: Goal: Knowledge of General Education information will improve Description: Including pain rating scale, medication(s)/side effects and non-pharmacologic comfort measures Outcome: Progressing   Problem: Health Behavior/Discharge Planning: Goal: Ability to manage health-related needs will improve Outcome: Progressing   Problem: Education: Goal: Ability to describe self-care measures that may prevent or decrease complications (Diabetes Survival Skills Education) will improve Outcome: Progressing Goal: Individualized Educational Video(s) Outcome: Progressing   Problem: Coping: Goal: Ability to adjust to condition or change in health will improve Outcome: Progressing   Problem: Fluid Volume: Goal: Ability to maintain a balanced intake and output will improve Outcome: Progressing   Problem: Health Behavior/Discharge Planning: Goal: Ability to identify and utilize available resources and services will improve Outcome: Progressing Goal: Ability to manage health-related needs will improve Outcome: Progressing   Problem: Metabolic: Goal: Ability to maintain appropriate glucose levels will improve Outcome: Progressing   Problem: Nutritional: Goal: Maintenance of adequate nutrition will improve Outcome: Progressing Goal: Progress toward achieving an optimal weight will improve Outcome: Progressing   Problem: Skin Integrity: Goal: Risk for impaired skin integrity  will decrease Outcome: Progressing   Problem: Tissue Perfusion: Goal: Adequacy of tissue perfusion will improve Outcome: Progressing
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
## 2023-09-01 NOTE — Progress Notes (Signed)
 Unable to administer 8:00 am meds due to patient sleeping. AC breakfast BG check held until patient awakens.

## 2023-09-01 NOTE — Group Note (Signed)
 Date:  09/01/2023 Time:  4:39 PM  Group Topic/Focus:  Activity Group: The focus of the group is to promote activity for the patients and to encourage them to go outside in the courtyard and get some fresh air and some exercise.    Participation Level:  Did Not Attend   Mary Sella Dane Kopke 09/01/2023, 4:39 PM

## 2023-09-01 NOTE — Progress Notes (Signed)
   09/01/23 1100  Psych Admission Type (Psych Patients Only)  Admission Status Voluntary  Psychosocial Assessment  Patient Complaints Anxiety (patient states "my guardian is no longer going to be working in the field".)  Eye Contact Fair;Watchful  Facial Expression Anxious;Worried  Affect Anxious;Appropriate to circumstance  Speech Logical/coherent  Interaction Assertive  Motor Activity Slow  Appearance/Hygiene Unremarkable  Behavior Characteristics Cooperative;Appropriate to situation  Mood Anxious;Pleasant  Aggressive Behavior  Effect No apparent injury  Thought Process  Coherency WDL  Content WDL  Delusions None reported or observed  Perception WDL  Hallucination None reported or observed  Judgment WDL  Confusion None  Danger to Self  Current suicidal ideation? Denies  Self-Injurious Behavior No self-injurious ideation or behavior indicators observed or expressed   Agreement Not to Harm Self Yes  Description of Agreement Verbal  Danger to Others  Danger to Others None reported or observed  Danger to Others Abnormal  Harmful Behavior to others No threats or harm toward other people  Destructive Behavior No threats or harm toward property

## 2023-09-01 NOTE — Plan of Care (Signed)
   Problem: Education: Goal: Emotional status will improve Outcome: Progressing Goal: Mental status will improve Outcome: Progressing Goal: Verbalization of understanding the information provided will improve Outcome: Progressing   Problem: Activity: Goal: Interest or engagement in activities will improve Outcome: Progressing Goal: Sleeping patterns will improve Outcome: Progressing

## 2023-09-01 NOTE — Group Note (Signed)
 Date:  09/01/2023 Time:  1:33 PM  Group Topic/Focus:  Rediscovering Joy:   The focus of this group is to explore various ways to relieve stress in a positive manner.    Participation Level:  Did Not Attend   Crystal Black [name] 09/01/2023, 1:33 PM

## 2023-09-02 DIAGNOSIS — F3160 Bipolar disorder, current episode mixed, unspecified: Secondary | ICD-10-CM | POA: Diagnosis not present

## 2023-09-02 LAB — GLUCOSE, CAPILLARY
Glucose-Capillary: 112 mg/dL — ABNORMAL HIGH (ref 70–99)
Glucose-Capillary: 142 mg/dL — ABNORMAL HIGH (ref 70–99)
Glucose-Capillary: 165 mg/dL — ABNORMAL HIGH (ref 70–99)
Glucose-Capillary: 180 mg/dL — ABNORMAL HIGH (ref 70–99)

## 2023-09-02 MED ORDER — INSULIN ASPART 100 UNIT/ML IJ SOLN
0.0000 [IU] | Freq: Three times a day (TID) | INTRAMUSCULAR | Status: DC
  Administered 2023-09-02: 2 [IU] via SUBCUTANEOUS
  Administered 2023-09-03: 1 [IU] via SUBCUTANEOUS
  Administered 2023-09-03 – 2023-09-04 (×3): 2 [IU] via SUBCUTANEOUS
  Administered 2023-09-04: 1 [IU] via SUBCUTANEOUS
  Administered 2023-09-05: 2 [IU] via SUBCUTANEOUS
  Administered 2023-09-06 – 2023-09-08 (×3): 1 [IU] via SUBCUTANEOUS
  Filled 2023-09-02 (×9): qty 1

## 2023-09-02 MED ORDER — ACETAMINOPHEN 325 MG PO TABS
650.0000 mg | ORAL_TABLET | Freq: Four times a day (QID) | ORAL | Status: DC | PRN
  Administered 2023-09-04: 650 mg via ORAL
  Filled 2023-09-02: qty 2

## 2023-09-02 NOTE — Plan of Care (Signed)
   Problem: Education: Goal: Mental status will improve Outcome: Progressing Goal: Verbalization of understanding the information provided will improve Outcome: Progressing   Problem: Activity: Goal: Interest or engagement in activities will improve Outcome: Progressing Goal: Sleeping patterns will improve Outcome: Progressing   Problem: Coping: Goal: Ability to verbalize frustrations and anger appropriately will improve Outcome: Progressing Goal: Ability to demonstrate self-control will improve Outcome: Progressing

## 2023-09-02 NOTE — Progress Notes (Signed)
   09/02/23 0800  Psych Admission Type (Psych Patients Only)  Admission Status Voluntary  Psychosocial Assessment  Patient Complaints None  Eye Contact Fair  Facial Expression  (Calm)  Affect Anxious;Appropriate to circumstance  Speech Logical/coherent  Interaction Assertive  Motor Activity Slow  Appearance/Hygiene Unremarkable  Behavior Characteristics Cooperative;Appropriate to situation  Mood Pleasant  Aggressive Behavior  Effect No apparent injury  Thought Process  Coherency WDL  Content WDL  Delusions None reported or observed  Perception WDL  Hallucination None reported or observed  Judgment WDL  Confusion None  Danger to Self  Current suicidal ideation? Denies  Self-Injurious Behavior No self-injurious ideation or behavior indicators observed or expressed   Agreement Not to Harm Self Yes  Description of Agreement verbal  Danger to Others  Danger to Others None reported or observed  Danger to Others Abnormal  Harmful Behavior to others No threats or harm toward other people  Destructive Behavior No threats or harm toward property

## 2023-09-02 NOTE — Plan of Care (Signed)
  Problem: Education: Goal: Knowledge of [facility] Education information/materials will improve Outcome: Adequate for Discharge Goal: Emotional status will improve Outcome: Adequate for Discharge Goal: Mental status will improve Outcome: Adequate for Discharge Goal: Verbalization of understanding the information provided will improve Outcome: Adequate for Discharge   Problem: Activity: Goal: Interest or engagement in activities will improve Outcome: Adequate for Discharge Goal: Sleeping patterns will improve Outcome: Adequate for Discharge   Problem: Safety: Goal: Periods of time without injury will increase Outcome: Adequate for Discharge

## 2023-09-02 NOTE — BH IP Treatment Plan (Signed)
 Interdisciplinary Treatment and Diagnostic Plan Update  09/02/2023 Time of Session: 10:16 am Crystal Black MRN: [id number]  Principal Diagnosis: Bipolar I disorder, most recent episode mixed (HCC)  Secondary Diagnoses: Principal Problem:   Bipolar I disorder, most recent episode mixed (HCC) Active Problems:   PTSD (post-traumatic stress disorder)   HIV disease (HCC)   Thrombosed hemorrhoids   Abdominal pain   Current Medications:  Current Facility-Administered Medications  Medication Dose Route Frequency Provider Last Rate Last Admin   albuterol (VENTOLIN HFA) 108 (90 Base) MCG/ACT inhaler 2 puff  2 puff Inhalation Q4H PRN [name], NP   2 puff at 08/23/23 1529   alum & mag hydroxide-simeth (MAALOX/MYLANTA) 200-200-20 MG/5ML suspension 30 mL  30 mL Oral Q4H PRN McLauchlin, [name], NP   30 mL at 08/02/23 2036   ARIPiprazole ER (ABILIFY MAINTENA) injection 300 mg  300 mg Intramuscular Q28 days [name], NP   300 mg at 08/07/23 1009   clonazePAM (KLONOPIN) disintegrating tablet 0.25 mg  0.25 mg Oral Daily [name], NP   0.25 mg at 09/02/23 0818   darunavir-cobicistat (PREZCOBIX) 800-150 MG per tablet 1 tablet  1 tablet Oral Q breakfast [name], NP   1 tablet at 09/02/23 4098   haloperidol (HALDOL) tablet 5 mg  5 mg Oral TID PRN McLauchlin, [name], NP       And   diphenhydrAMINE (BENADRYL) capsule 50 mg  50 mg Oral TID PRN McLauchlin, [name], NP       haloperidol (HALDOL) tablet 5 mg  5 mg Oral TID PRN [name], NP   5 mg at 08/14/23 1942   And   diphenhydrAMINE (BENADRYL) capsule 50 mg  50 mg Oral TID PRN [name], NP   50 mg at 08/14/23 1942   haloperidol lactate (HALDOL) injection 5 mg  5 mg Intramuscular TID PRN McLauchlin, [name], NP       And   diphenhydrAMINE (BENADRYL) injection 50 mg  50 mg Intramuscular TID PRN McLauchlin, [name], NP   50 mg at 08/04/23 1412   And   LORazepam (ATIVAN) injection 2 mg  2 mg Intramuscular TID PRN  McLauchlin, [name], NP   2 mg at 08/04/23 1412   haloperidol lactate (HALDOL) injection 10 mg  10 mg Intramuscular TID PRN McLauchlin, [name], NP       And   diphenhydrAMINE (BENADRYL) injection 50 mg  50 mg Intramuscular TID PRN McLauchlin, [name], NP       And   LORazepam (ATIVAN) injection 2 mg  2 mg Intramuscular TID PRN McLauchlin, [name], NP       haloperidol lactate (HALDOL) injection 5 mg  5 mg Intramuscular TID PRN [name], NP       And   diphenhydrAMINE (BENADRYL) injection 50 mg  50 mg Intramuscular TID PRN [name], NP       And   LORazepam (ATIVAN) injection 2 mg  2 mg Intramuscular TID PRN [name], NP       haloperidol lactate (HALDOL) injection 10 mg  10 mg Intramuscular TID PRN [name], NP   10 mg at 08/17/23 1800   And   diphenhydrAMINE (BENADRYL) injection 50 mg  50 mg Intramuscular TID PRN [name], NP   50 mg at 08/17/23 1800   And   LORazepam (ATIVAN) injection 2 mg  2 mg Intramuscular TID PRN [name], NP   2 mg at 08/17/23 1801  divalproex (DEPAKOTE ER) 24 hr tablet 500 mg  500 mg Oral BID [name], NP   500 mg at 09/02/23 5409   docusate sodium (COLACE) capsule 200 mg  200 mg Oral Daily [name], [name], NP   200 mg at 09/02/23 0818   dolutegravir (TIVICAY) tablet 50 mg  50 mg Oral Daily [name], NP   50 mg at 09/02/23 8119   EPINEPHrine (EPI-PEN) injection 0.3 mg  0.3 mg Intramuscular Once [name], NP       famotidine (PEPCID) tablet 20 mg  20 mg Oral Daily [name], NP   20 mg at 09/02/23 0818   feeding supplement (GLUCERNA SHAKE) (GLUCERNA SHAKE) liquid 237 mL  237 mL Oral TID WC [name], NP   237 mL at 08/28/23 1752   FLUoxetine (PROZAC) capsule 20 mg  20 mg Oral Daily [name], NP   20 mg at 09/02/23 0818   hydrocortisone (ANUSOL-HC) 2.5 % rectal cream   Rectal BID [name], MD   1 Application at 08/27/23 1724   hydrOXYzine (ATARAX) tablet 50 mg  50 mg  Oral Q6H PRN [name], NP   50 mg at 08/31/23 1217   ibuprofen (ADVIL) tablet 600 mg  600 mg Oral Q8H PRN [name], NP   600 mg at 08/17/23 1233   influenza vac split trivalent PF (FLULAVAL) injection 0.5 mL  0.5 mL Intramuscular Tomorrow-1000 [name], MD       insulin aspart (novoLOG) injection 0-9 Units  0-9 Units Subcutaneous TID WC [name], NP   1 Units at 09/01/23 1214   linaclotide (LINZESS) capsule 290 mcg  290 mcg Oral QAC breakfast [name], NP   290 mcg at 09/02/23 1478   magnesium hydroxide (MILK OF MAGNESIA) suspension 30 mL  30 mL Oral Daily PRN McLauchlin, [name], NP   30 mL at 08/29/23 1742   melatonin tablet 5 mg  5 mg Oral QHS [name], NP   5 mg at 09/01/23 2118   menthol-cetylpyridinium (CEPACOL) lozenge 3 mg  1 lozenge Oral PRN Tingling, [name], [name]   3 mg at 09/02/23 2956   metFORMIN (GLUCOPHAGE) tablet 500 mg  500 mg Oral BID WC [name], NP   500 mg at 09/02/23 0818   methocarbamol (ROBAXIN) tablet 500 mg  500 mg Oral QHS [name], NP   500 mg at 09/01/23 2111   montelukast (SINGULAIR) tablet 5 mg  5 mg Oral QHS [name], [name], DO   5 mg at 09/01/23 2110   nicotine polacrilex (NICORETTE) gum 4 mg  4 mg Oral PRN [name], NP   4 mg at 09/02/23 0824   ondansetron (ZOFRAN) tablet 4 mg  4 mg Oral Q8H PRN [name], NP   4 mg at 08/27/23 1903   polyethylene glycol (MIRALAX / GLYCOLAX) packet 17 g  17 g Oral Daily [name], [name], NP   17 g at 09/02/23 0818   prazosin (MINIPRESS) capsule 10 mg  10 mg Oral QHS [name], NP   10 mg at 09/01/23 2115   traZODone (DESYREL) tablet 50 mg  50 mg Oral QHS PRN McLauchlin, [name], NP   50 mg at 08/29/23 2101   PTA Medications: Medications Prior to Admission  Medication Sig Dispense Refill Last Dose/Taking   albuterol (VENTOLIN HFA) 108 (90 Base) MCG/ACT inhaler Inhale 2 puffs into the lungs every  4 (four) hours as needed for wheezing or shortness of breath.   Taking As  Needed   ARIPiprazole (ABILIFY) 2 MG tablet Take 1 tablet (2 mg total) by mouth daily. 12 tablet 0 07/17/2023 at  8:00 AM   ARIPiprazole ER (ABILIFY MAINTENA) 400 MG SRER injection Inject 1.5 mLs (300 mg total) into the muscle every 28 (twenty-eight) days. 1.5 mL 0 06/30/2023   benztropine (COGENTIN) 1 MG tablet Take 1 tablet (1 mg total) by mouth 2 (two) times daily. 60 tablet 0 07/17/2023 at  8:00 PM   darunavir-cobicistat (PREZCOBIX) 800-150 MG tablet Take 1 tablet by mouth daily with breakfast. Swallow whole. Do NOT crush, break or chew tablets. Take with food. 30 tablet 1 07/17/2023 at  8:00 AM   divalproex (DEPAKOTE ER) 250 MG 24 hr tablet Take 1 tablet (250 mg total) by mouth 2 (two) times daily. 60 tablet 0 07/17/2023 at  8:00 PM   docusate sodium (COLACE) 100 MG capsule Take 300 mg by mouth daily.   07/17/2023 at  8:00 AM   dolutegravir (TIVICAY) 50 MG tablet Take 1 tablet (50 mg total) by mouth daily. 30 tablet 1 07/17/2023 at  8:00 AM   EPINEPHrine 0.3 mg/0.3 mL IJ SOAJ injection Inject 0.3 mg into the muscle as needed for anaphylaxis.   Taking As Needed   famotidine (PEPCID) 20 MG tablet Take 20 mg by mouth 2 (two) times daily.   Taking   FIBER ADULT GUMMIES PO Take 1 tablet by mouth in the morning and at bedtime.   07/17/2023 at  8:00 PM   FLUoxetine (PROZAC) 40 MG capsule Take 40 mg by mouth at bedtime.   07/17/2023 at  8:00 PM   glycerin adult 2 g suppository Place 1 suppository rectally as needed for constipation.   Taking As Needed   hydrOXYzine (ATARAX) 25 MG tablet Take 1 tablet (25 mg total) by mouth 3 (three) times daily as needed. 60 tablet 1 Taking As Needed   ibuprofen (ADVIL) 800 MG tablet Take 1 tablet (800 mg total) by mouth every 8 (eight) hours as needed for moderate pain. 15 tablet 0 Taking As Needed   ketoconazole (NIZORAL) 2 % shampoo Apply 1 Application topically. Monday/ Wednesday/ Friday   07/17/2023 at  3:00 PM   linaclotide (LINZESS) 290 MCG CAPS capsule Take 290 mcg by  mouth daily before breakfast.   07/17/2023 at  7:00 AM   magnesium citrate SOLN Take 1 Bottle by mouth daily as needed for severe constipation.   Taking As Needed   melatonin 5 MG TABS Take 0.5 tablets (2.5 mg total) by mouth at bedtime. 15 tablet 0 07/17/2023 at  8:00 PM   methocarbamol (ROBAXIN) 500 MG tablet Take 500 mg by mouth at bedtime.   Taking   montelukast (SINGULAIR) 10 MG tablet Take 10 mg by mouth daily.   07/17/2023 at  8:00 AM   nicotine (NICODERM CQ - DOSED IN MG/24 HR) 7 mg/24hr patch Place 1 patch (7 mg total) onto the skin daily. 360 patch 0 07/17/2023 at  8:00 AM   nicotine polacrilex (NICORETTE) 2 MG gum Take 2 mg by mouth as needed for smoking cessation.   Taking As Needed   nicotine polacrilex (NICOTINE MINI) 4 MG lozenge Take 1 lozenge (4 mg total) by mouth as needed. 100 tablet 0 Taking As Needed   ondansetron (ZOFRAN-ODT) 4 MG disintegrating tablet Take 1 tablet (4 mg total) by mouth every 8 (eight) hours as needed for nausea  or vomiting. 20 tablet 0 Taking As Needed   prazosin (MINIPRESS) 5 MG capsule Take 1 capsule (5 mg total) by mouth at bedtime. 30 capsule 1 07/17/2023 at  8:00 PM   risperiDONE (RISPERDAL) 0.5 MG tablet Take 0.5 mg by mouth 2 (two) times daily. 0800/1600   07/17/2023 at  4:00 PM   topiramate (TOPAMAX) 50 MG tablet Take 1 tablet (50 mg total) by mouth 2 (two) times daily. (Patient taking differently: Take 50 mg by mouth at bedtime.) 60 tablet 1 07/17/2023 at  8:00 PM   traZODone (DESYREL) 50 MG tablet Take 50 mg by mouth at bedtime.   07/17/2023 at  8:00 PM   etonogestrel (NEXPLANON) 68 MG IMPL implant 1 each (68 mg total) by Subdermal route once for 1 dose. 1 each 0     Patient Stressors:    Patient Strengths:    Treatment Modalities: Medication Management, Group therapy, Case management,  1 to 1 session with clinician, Psychoeducation, Recreational therapy.   Physician Treatment Plan for Primary Diagnosis: Bipolar I disorder, most recent episode mixed  (HCC) Long Term Goal(s): Improvement in symptoms so as ready for discharge   Short Term Goals: Ability to identify changes in lifestyle to reduce recurrence of condition will improve Ability to verbalize feelings will improve Ability to disclose and discuss suicidal ideas Ability to demonstrate self-control will improve Ability to identify and develop effective coping behaviors will improve Ability to maintain clinical measurements within normal limits will improve Compliance with prescribed medications will improve Ability to identify triggers associated with substance abuse/mental health issues will improve  Medication Management: Evaluate patient's response, side effects, and tolerance of medication regimen.  Therapeutic Interventions: 1 to 1 sessions, Unit Group sessions and Medication administration.  Evaluation of Outcomes: Progressing  Physician Treatment Plan for Secondary Diagnosis: Principal Problem:   Bipolar I disorder, most recent episode mixed (HCC) Active Problems:   PTSD (post-traumatic stress disorder)   HIV disease (HCC)   Thrombosed hemorrhoids   Abdominal pain  Long Term Goal(s): Improvement in symptoms so as ready for discharge   Short Term Goals: Ability to identify changes in lifestyle to reduce recurrence of condition will improve Ability to verbalize feelings will improve Ability to disclose and discuss suicidal ideas Ability to demonstrate self-control will improve Ability to identify and develop effective coping behaviors will improve Ability to maintain clinical measurements within normal limits will improve Compliance with prescribed medications will improve Ability to identify triggers associated with substance abuse/mental health issues will improve     Medication Management: Evaluate patient's response, side effects, and tolerance of medication regimen.  Therapeutic Interventions: 1 to 1 sessions, Unit Group sessions and Medication  administration.  Evaluation of Outcomes: Progressing   RN Treatment Plan for Primary Diagnosis: Bipolar I disorder, most recent episode mixed (HCC) Long Term Goal(s): Knowledge of disease and therapeutic regimen to maintain health will improve  Short Term Goals: Ability to remain free from injury will improve, Ability to verbalize frustration and anger appropriately will improve, Ability to demonstrate self-control, Ability to participate in decision making will improve, Ability to verbalize feelings will improve, Ability to disclose and discuss suicidal ideas, Ability to identify and develop effective coping behaviors will improve, and Compliance with prescribed medications will improve  Medication Management: RN will administer medications as ordered by provider, will assess and evaluate patient's response and provide education to patient for prescribed medication. RN will report any adverse and/or side effects to prescribing provider.  Therapeutic Interventions: 1 on  1 counseling sessions, Psychoeducation, Medication administration, Evaluate responses to treatment, Monitor vital signs and CBGs as ordered, Perform/monitor CIWA, COWS, AIMS and Fall Risk screenings as ordered, Perform wound care treatments as ordered.  Evaluation of Outcomes: Progressing   LCSW Treatment Plan for Primary Diagnosis: Bipolar I disorder, most recent episode mixed (HCC) Long Term Goal(s): Safe transition to appropriate next level of care at discharge, Engage patient in therapeutic group addressing interpersonal concerns.  Short Term Goals: Engage patient in aftercare planning with referrals and resources, Increase social support, Increase ability to appropriately verbalize feelings, Increase emotional regulation, Facilitate acceptance of mental health diagnosis and concerns, and Increase skills for wellness and recovery  Therapeutic Interventions: Assess for all discharge needs, 1 to 1 time with Social worker,  Explore available resources and support systems, Assess for adequacy in community support network, Educate family and significant other(s) on suicide prevention, Complete Psychosocial Assessment, Interpersonal group therapy.  Evaluation of Outcomes: Progressing   Progress in Treatment: Attending groups: No. Participating in groups: No. Taking medication as prescribed: Yes. Toleration medication: Yes. Family/Significant other contact made: Yes, individual(s) contacted:   guardian, [name], [phone] Patient understands diagnosis: Yes. Discussing patient identified problems/goals with staff: Yes. Medical problems stabilized or resolved: Yes. Denies suicidal/homicidal ideation: Yes. Issues/concerns per patient self-inventory: No. Other: None  New problem(s) identified:  No, Describe:  none identified. Update 08/03/2023:  No changes at this time. Update 08/08/2023:  No changes at this time. Update 08/13/2023: No changes at this time. Update 08/18/23: Patient has been navigating her complex feelings while interviewing for placement. Update 08/23/2023:  No changes at this time. Update 08/28/23: No changes at this time. Update 09/02/2023: No changes at this time.   New Short Term/Long Term Goal(s): medication management for mood stabilization; elimination of SI thoughts; development of comprehensive mental wellness plan. Update 07/24/23: No changes at this time. Update: 07/29/2023: No changes at this time.  Update 08/03/2023:  No changes at this time. Update 08/08/2023:  No changes at this time.  Update 08/13/2023: No changes at this time. Update 08/18/2023: No changes at this time. Update 08/23/2023:  No changes at this time. Update 08/28/23: No changes at this time.   Update 09/02/2023: No changes at this time.    Patient Goals:  Pt declined to participate in treatment team meeting. Update 07/24/23: No changes at this time.  Update 08/03/2023:  No changes at this time. Update 08/08/2023:  No changes at this  time.   Update 08/13/2023: No changes at this time. Update 08/18/2023: No changes at this time.  Update 08/23/2023:  No changes at this time. Update 08/28/23: No changes at this time.   Update 09/02/2023: No changes at this time.   Discharge Plan or Barriers:  CSW will assist pt/guardian with development of an appropriate aftercare/discharge plan. Update 07/24/23: Guardian notified team that pt will not be returning to group home. Update: 07/29/2023: No changes at this time.  Update 08/03/2023:  Patient's guardian continues to look for placement for the patient.  Patient remains safe on the unit. Update 08/08/2023:  Patient continues to wait for placement.  CSW has sent FL2 to the patient's guardian to assist in bed placement.   Update 08/13/2023: The patient interview for [facility] was not successful, she has interviews with her guardian and other AFL providers coming up. Update 08/18/2023: Patient has had interview with AFL provider. Team is still awaiting facility's decision. Update 08/23/2023:  Potential placement has been located for patient.  Barriers have arisen  due to communication barriers with the prospective AFL provider.  CSW is working on plan to address these. Update 08/28/23: Potential placement face to face interview completed. Still awaiting concrete decision regarding placement.  Update 09/02/2023: No changes at this time.   Reason for Continuation of Hospitalization:  Aggression Depression Medication stabilization Suicidal ideation   Estimated Length of Stay: 1-7 days Update 07/24/23: No changes at this time. Update: 07/29/2023: No changes at this time  Update 08/03/2023:  TBD Update 08/08/2023:  TBD  Update 08/13/2023: TBD  Update 08/18/2023: TBD Update 08/23/2023:  TBD Update 08/28/23: TBD   Update 09/02/2023: TBD  Last 3 [place] Suicide Severity Risk Score: Flowsheet Row Admission (Current) from 07/18/2023 in [facility]PAT[facility]AL MEDICINE ED from 07/11/2023 in [facility] Emergency Department at  [facility] Admission (Discharged) from 07/05/2023 in [facility] INPATIENT BE[facility]IORAL MEDICINE  C-SSRS RISK CATEGORY High Risk Error: Q3, 4, or 5 should not be populated when Q2 is No High Risk       Last PHQ 2/9 Scores:    05/04/2023    9:21 AM 02/16/2023    9:49 AM 11/17/2022   10:53 AM  Depression screen PHQ 2/9  Decreased Interest 0 0 0  Down, Depressed, Hopeless 0 0 1  PHQ - 2 Score 0 0 1    Scribe for Treatment Team: Marshell Levan, LCSW 09/02/2023 10:16 AM

## 2023-09-02 NOTE — Group Note (Signed)
 Date:  09/02/2023 Time:  8:48 PM  Group Topic/Focus:  Wrap-Up Group:   The focus of this group is to help patients review their daily goal of treatment and discuss progress on daily workbooks.    Participation Level:  Did Not Attend  Participation Quality:      Affect:      Cognitive:      Insight: None  Engagement in Group:  None  Modes of Intervention:      Additional Comments:    Maeola Harman 09/02/2023, 8:48 PM

## 2023-09-02 NOTE — B[facility] Counselor (Signed)
 The patient asked the LCSWA to contact Langley Adie the Group Home manager to see if her guardian has contacted her regarding clothing. The patient stated that her guardian is supposed to bring her some clothes. The LCSWA attempted to contact the group home manager but there was no answer, so a VM was left.    Patric Dykes, MSW, [employer]

## 2023-09-02 NOTE — Progress Notes (Signed)
 Patient affect and mood remain bright and pleasant on approach, she denies SI, plan or intent and was compliant with her medication regimen. Pt remain on 1:1 safety sitter for safety and support.
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
## 2023-09-03 DIAGNOSIS — F3160 Bipolar disorder, current episode mixed, unspecified: Secondary | ICD-10-CM | POA: Diagnosis not present

## 2023-09-03 LAB — GLUCOSE, CAPILLARY
Glucose-Capillary: 109 mg/dL — ABNORMAL HIGH (ref 70–99)
Glucose-Capillary: 137 mg/dL — ABNORMAL HIGH (ref 70–99)
Glucose-Capillary: 174 mg/dL — ABNORMAL HIGH (ref 70–99)
Glucose-Capillary: 139 mg/dL — ABNORMAL HIGH (ref 70–99)
Glucose-Capillary: 172 mg/dL — ABNORMAL HIGH (ref 70–99)

## 2023-09-03 NOTE — Plan of Care (Signed)
  Problem: Education: Goal: Emotional status will improve Outcome: Progressing   Problem: Activity: Goal: Interest or engagement in activities will improve Outcome: Progressing   Problem: Safety: Goal: Periods of time without injury will increase Outcome: Progressing   Problem: Education: Goal: Knowledge of the prescribed therapeutic regimen will improve Outcome: Progressing   Problem: Activity: Goal: Interest or engagement in leisure activities will improve Outcome: Progressing

## 2023-09-03 NOTE — Group Note (Signed)
 Date:  09/03/2023 Time:  11:41 PM  Group Topic/Focus:  Healthy Communication:   The focus of this group is to discuss communication, barriers to communication, as well as healthy ways to communicate with others.    Participation Level:  Did Not Attend  Participation Quality:    Affect:    Cognitive:    Insight:   Engagement in Group:    Modes of Intervention:    Additional Comments:    Garvis Downum 09/03/2023, 11:41 PM

## 2023-09-03 NOTE — Progress Notes (Addendum)
 Pt continues to be maintained on 1:1 safety sitter while awake. She was needy and seeking attention and when told no, she became loud and demanding to be left alone but was able to maintain her composure and apologized for her behavior, accepted her scheduled medications and retired to bed without further outburst or concerns.     09/02/23 2130  Psych Admission Type (Psych Patients Only)  Admission Status Voluntary  Psychosocial Assessment  Patient Complaints None  Eye Contact Fair  Facial Expression Animated  Affect Anxious  Speech Logical/coherent  Interaction Assertive  Motor Activity Slow  Appearance/Hygiene Unremarkable  Behavior Characteristics Anxious  Mood Labile  Thought Process  Coherency WDL  Content WDL  Delusions None reported or observed  Perception WDL  Hallucination None reported or observed  Judgment Impaired  Confusion None  Danger to Self  Current suicidal ideation? Denies  Agreement Not to Harm Self Yes  Description of Agreement verbal

## 2023-09-03 NOTE — Progress Notes (Signed)
   09/03/23 0900  Psych Admission Type (Psych Patients Only)  Admission Status Voluntary  Psychosocial Assessment  Patient Complaints None  Eye Contact Fair  Facial Expression Animated  Affect Appropriate to circumstance  Speech Logical/coherent  Interaction Assertive  Motor Activity Slow  Appearance/Hygiene Unremarkable  Behavior Characteristics Cooperative;Appropriate to situation  Mood Pleasant;Labile  Thought Process  Coherency WDL  Content WDL  Delusions None reported or observed  Perception WDL  Hallucination None reported or observed  Judgment Impaired  Confusion None  Danger to Self  Current suicidal ideation? Denies  Danger to Others  Danger to Others None reported or observed  Danger to Others Abnormal  Harmful Behavior to others No threats or harm toward other people  Destructive Behavior No threats or harm toward property
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
## 2023-09-03 NOTE — [facility] Group Notes (Signed)
 LCSW Wellness Group Note   09/03/2023 2:00pm  Type of Group and Topic: Psychoeducational Group:  Wellness  Participation Level:  none  Description of Group  Wellness group introduces the topic and its focus on developing healthy habits across the spectrum and its relationship to a decrease in hospital admissions.  Six areas of wellness are discussed: physical, social spiritual, intellectual, occupational, and emotional.  Patients are asked to consider their current wellness habits and to identify areas of wellness where they are interested and able to focus on improvements.    Therapeutic Goals Patients will understand components of wellness and how they can positively impact overall health.  Patients will identify areas of wellness where they have developed good habits. Patients will identify areas of wellness where they would like to make improvements.    Summary of Patient Progress: pt initially came to group but left just as it was starting and did not return.      Therapeutic Modalities: Cognitive Behavioral Therapy Psychoeducation    Lorri Frederick, LCSW

## 2023-09-04 DIAGNOSIS — F3160 Bipolar disorder, current episode mixed, unspecified: Secondary | ICD-10-CM | POA: Diagnosis not present

## 2023-09-04 LAB — GLUCOSE, CAPILLARY
Glucose-Capillary: 137 mg/dL — ABNORMAL HIGH (ref 70–99)
Glucose-Capillary: 118 mg/dL — ABNORMAL HIGH (ref 70–99)
Glucose-Capillary: 91 mg/dL (ref 70–99)
Glucose-Capillary: 232 mg/dL — ABNORMAL HIGH (ref 70–99)

## 2023-09-04 MED ORDER — TUBERCULIN PPD 5 UNIT/0.1ML ID SOLN
5.0000 [IU] | Freq: Once | INTRADERMAL | Status: AC
  Administered 2023-09-04: 5 [IU] via INTRADERMAL
  Filled 2023-09-04: qty 0.1

## 2023-09-04 NOTE — Group Note (Signed)
 Recreation Therapy Group Note   Group Topic:Health and Wellness  Group Date: 09/04/2023 Start Time: 1530 End Time: 1620 Facilitators: Clinton Gallant, CTRS Location: Craft Room  Activity Description/Intervention: Therapeutic Drumming. Patients with peers and staff were given the opportunity to engage in a leader facilitated [facility] Group Empowerment Drumming Circle with staff from the [employer], in partnership with [employer]. Teaching laboratory technician and trained [employer], Theodoro Doing leading with LRT observing and documenting intervention and pt response. This evidenced-based practice targets 7 areas of health and wellbeing in the human experience including: stress-reduction, exercise, self-expression, camaraderie/support, nurturing, spirituality, and music-making (leisure).    Goal Area(s) Addresses:  Patient will engage in pro-social way in music group.  Patient will follow directions of drum leader on the first prompt. Patient will demonstrate no behavioral issues during group.  Patient will identify if a reduction in stress level occurs as a result of participation in therapeutic drum circle.     Affect/Mood: N/A   Participation Level: Did not attend    Clinical Observations/Individualized Feedback: Patient did not attend group.  Plan: Continue to engage patient in RT group sessions 2-3x/week.   Rosina Lowenstein, LRT, CTRS 09/04/2023 5:23 PM

## 2023-09-04 NOTE — Plan of Care (Signed)
  Problem: Education: Goal: Verbalization of understanding the information provided will improve Outcome: Progressing   Problem: Coping: Goal: Ability to verbalize frustrations and anger appropriately will improve Outcome: Progressing

## 2023-09-04 NOTE — Progress Notes (Signed)
   09/04/23 0900  Psych Admission Type (Psych Patients Only)  Admission Status Voluntary  Psychosocial Assessment  Patient Complaints None  Eye Contact Fair  Facial Expression Animated  Affect Appropriate to circumstance  Speech Logical/coherent  Interaction Assertive  Motor Activity Slow  Appearance/Hygiene Unremarkable  Behavior Characteristics Appropriate to situation  Mood Pleasant  Thought Process  Coherency WDL  Content WDL  Delusions None reported or observed  Perception WDL  Hallucination None reported or observed  Judgment Impaired  Confusion None  Danger to Self  Current suicidal ideation? Denies  Self-Injurious Behavior No self-injurious ideation or behavior indicators observed or expressed   Agreement Not to Harm Self Yes  Description of Agreement Verbal  Danger to Others  Danger to Others None reported or observed  Danger to Others Abnormal  Harmful Behavior to others No threats or harm toward other people  Destructive Behavior No threats or harm toward property

## 2023-09-04 NOTE — Group Note (Signed)
 Recreation Therapy Group Note   Group Topic:Coping Skills  Group Date: 09/04/2023 Start Time: 1000 End Time: 1100 Facilitators: Rosina Lowenstein, LRT, CTRS Location:  Craft Room  Group Description: Mind Map.  Patient was provided a blank template of a diagram with 32 blank boxes in a tiered system, branching from the center (similar to a bubble chart). LRT directed patients to label the middle of the diagram "Coping Skills". LRT and patients then came up with 8 different coping skills as examples. Pt were directed to record their coping skills in the 2nd tier boxes closest to the center.  Patients would then share their coping skills with the group as LRT wrote them out. LRT gave a handout of 99 different coping skills at the end of group.   Goal Area(s) Addressed: Patients will be able to define "coping skills". Patient will identify new coping skills.  Patient will increase communication.   Affect/Mood: N/A   Participation Level: Did not attend    Clinical Observations/Individualized Feedback: Patient did not attend group.   Plan: Continue to engage patient in RT group sessions 2-3x/week.   Rosina Lowenstein, LRT, CTRS 09/04/2023 11:18 AM

## 2023-09-04 NOTE — Progress Notes (Signed)
 Pt calm and pleasant during assessment denying SI/HI/AVH. Pt observed by this Clinical research associate interacting appropriately with staff and peers on the unit. Pt compliant with medication administration per MD orders. Pt given education, support, and encouragement to be active in her treatment plan. Pt being monitored Q 15 minutes for safety per unit protocol, remains safe on the unit
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
## 2023-09-04 NOTE — Progress Notes (Signed)
   09/04/23 1400  Spiritual Encounters  Type of Visit Follow up  Care provided to: Patient  Conversation partners present during encounter Social worker/Care management/TOC;Nurse  Referral source Patient request  Reason for visit Routine spiritual support  OnCall Visit No  Spiritual Framework  Presenting Themes Impactful experiences and emotions;Courage hope and growth;Meaning/purpose/sources of inspiration;Rituals and practive  Patient Stress Factors Major life changes  Interventions  Spiritual Care Interventions Made Prayer;Encouragement;Established relationship of care and support;Compassionate presence;Reflective listening   Patient asked chaplain to pray for her because she is being discharged soon. Chaplain offered her words of encouragement and prayed for her.

## 2023-09-04 NOTE — Group Note (Signed)
 [facility] LCSW Group Therapy Note    Group Date: 09/04/2023 Start Time: 1430 End Time: 1530  Type of Therapy and Topic:  Group Therapy:  Overcoming Obstacles  Participation Level:  [facility] PARTICIPATION LEVEL: Did Not Attend  Mood:  Description of Group:   In this group patients will be encouraged to explore what they see as obstacles to their own wellness and recovery. They will be guided to discuss their thoughts, feelings, and behaviors related to these obstacles. The group will process together ways to cope with barriers, with attention given to specific choices patients can make. Each patient will be challenged to identify changes they are motivated to make in order to overcome their obstacles. This group will be process-oriented, with patients participating in exploration of their own experiences as well as giving and receiving support and challenge from other group members.  Therapeutic Goals: 1. Patient will identify personal and current obstacles as they relate to admission. 2. Patient will identify barriers that currently interfere with their wellness or overcoming obstacles.  3. Patient will identify feelings, thought process and behaviors related to these barriers. 4. Patient will identify two changes they are willing to make to overcome these obstacles:    Summary of Patient Progress Patient declined to attend group.   Therapeutic Modalities:   Cognitive Behavioral Therapy Solution Focused Therapy Motivational Interviewing Relapse Prevention Therapy   Harden Mo, LCSW

## 2023-09-04 NOTE — Group Note (Signed)
 Date:  09/04/2023 Time:  8:34 PM  Group Topic/Focus:  Wellness Toolbox:   The focus of this group is to discuss various aspects of wellness, balancing those aspects and exploring ways to increase the ability to experience wellness.  Patients will create a wellness toolbox for use upon discharge. Wrap-Up Group:   The focus of this group is to help patients review their daily goal of treatment and discuss progress on daily workbooks.    Participation Level:  Active  Participation Quality:  Appropriate and Attentive  Affect:  Appropriate  Cognitive:  Alert and Appropriate  Insight: Appropriate  Engagement in Group:  Engaged and Improving  Modes of Intervention:  Discussion and Orientation  Additional Comments:     Maglione,Samiel Peel E 09/04/2023, 8:34 PM

## 2023-09-04 NOTE — Progress Notes (Signed)
   09/03/23 2000  Psych Admission Type (Psych Patients Only)  Admission Status Voluntary  Psychosocial Assessment  Patient Complaints None  Eye Contact Fair  Facial Expression Animated  Affect Appropriate to circumstance  Speech Logical/coherent  Interaction Assertive  Motor Activity Slow  Appearance/Hygiene Unremarkable  Behavior Characteristics Appropriate to situation  Mood Pleasant;Labile  Thought Process  Coherency WDL  Content WDL  Delusions None reported or observed  Perception WDL  Hallucination None reported or observed  Judgment Impaired  Confusion None  Danger to Self  Current suicidal ideation?  (Denies)  Agreement Not to Harm Self Yes  Description of Agreement Verbal

## 2023-09-05 DIAGNOSIS — F3160 Bipolar disorder, current episode mixed, unspecified: Secondary | ICD-10-CM | POA: Diagnosis not present

## 2023-09-05 LAB — GLUCOSE, CAPILLARY
Glucose-Capillary: 153 mg/dL — ABNORMAL HIGH (ref 70–99)
Glucose-Capillary: 90 mg/dL (ref 70–99)
Glucose-Capillary: 190 mg/dL — ABNORMAL HIGH (ref 70–99)

## 2023-09-05 NOTE — Plan of Care (Signed)
  Problem: Education: Goal: Knowledge of [facility] Education information/materials will improve Outcome: Progressing Goal: Emotional status will improve Outcome: Progressing Goal: Mental status will improve Outcome: Progressing Goal: Verbalization of understanding the information provided will improve Outcome: Progressing   Problem: Activity: Goal: Interest or engagement in activities will improve Outcome: Progressing Goal: Sleeping patterns will improve Outcome: Progressing   Problem: Coping: Goal: Ability to verbalize frustrations and anger appropriately will improve Outcome: Progressing Goal: Ability to demonstrate self-control will improve Outcome: Progressing   Problem: Health Behavior/Discharge Planning: Goal: Identification of resources available to assist in meeting health care needs will improve Outcome: Progressing Goal: Compliance with treatment plan for underlying cause of condition will improve Outcome: Progressing   Problem: Physical Regulation: Goal: Ability to maintain clinical measurements within normal limits will improve Outcome: Progressing   Problem: Safety: Goal: Periods of time without injury will increase Outcome: Progressing   Problem: Education: Goal: Utilization of techniques to improve thought processes will improve Outcome: Progressing Goal: Knowledge of the prescribed therapeutic regimen will improve Outcome: Progressing   Problem: Activity: Goal: Interest or engagement in leisure activities will improve Outcome: Progressing Goal: Imbalance in normal sleep/wake cycle will improve Outcome: Progressing   Problem: Coping: Goal: Coping ability will improve Outcome: Progressing Goal: Will verbalize feelings Outcome: Progressing   Problem: Health Behavior/Discharge Planning: Goal: Ability to make decisions will improve Outcome: Progressing Goal: Compliance with therapeutic regimen will improve Outcome: Progressing    Problem: Role Relationship: Goal: Will demonstrate positive changes in social behaviors and relationships Outcome: Progressing   Problem: Safety: Goal: Ability to disclose and discuss suicidal ideas will improve Outcome: Progressing Goal: Ability to identify and utilize support systems that promote safety will improve Outcome: Progressing   Problem: Self-Concept: Goal: Will verbalize positive feelings about self Outcome: Progressing Goal: Level of anxiety will decrease Outcome: Progressing   Problem: Education: Goal: Knowledge of General Education information will improve Description: Including pain rating scale, medication(s)/side effects and non-pharmacologic comfort measures Outcome: Progressing   Problem: Health Behavior/Discharge Planning: Goal: Ability to manage health-related needs will improve Outcome: Progressing   Problem: Education: Goal: Ability to describe self-care measures that may prevent or decrease complications (Diabetes Survival Skills Education) will improve Outcome: Progressing Goal: Individualized Educational Video(s) Outcome: Progressing   Problem: Coping: Goal: Ability to adjust to condition or change in health will improve Outcome: Progressing   Problem: Fluid Volume: Goal: Ability to maintain a balanced intake and output will improve Outcome: Progressing   Problem: Health Behavior/Discharge Planning: Goal: Ability to identify and utilize available resources and services will improve Outcome: Progressing Goal: Ability to manage health-related needs will improve Outcome: Progressing   Problem: Metabolic: Goal: Ability to maintain appropriate glucose levels will improve Outcome: Progressing   Problem: Nutritional: Goal: Maintenance of adequate nutrition will improve Outcome: Progressing Goal: Progress toward achieving an optimal weight will improve Outcome: Progressing   Problem: Skin Integrity: Goal: Risk for impaired skin integrity  will decrease Outcome: Progressing   Problem: Tissue Perfusion: Goal: Adequacy of tissue perfusion will improve Outcome: Progressing

## 2023-09-05 NOTE — Plan of Care (Signed)
   Problem: Education: Goal: Knowledge of [facility] Education information/materials will improve Outcome: Progressing Goal: Mental status will improve Outcome: Progressing Goal: Verbalization of understanding the information provided will improve Outcome: Progressing

## 2023-09-05 NOTE — Group Note (Signed)
 Date:  09/05/2023 Time:  10:00 AM  Group Topic/Focus:  Wellness Toolbox:   The focus of this group is to discuss various aspects of wellness, balancing those aspects and exploring ways to increase the ability to experience wellness.  Patients will create a wellness toolbox for use upon discharge.    Participation Level:  Minimal  Participation Quality:  Appropriate  Affect:  Appropriate  Cognitive:  Appropriate  Insight: Appropriate  Engagement in Group:  Engaged  Modes of Intervention:  Activity and Discussion  Additional Comments:    Wilford Corner 09/05/2023, 10:00 AM

## 2023-09-05 NOTE — Progress Notes (Signed)
   09/05/23 1430  Spiritual Encounters  Type of Visit Initial  Care provided to: Patient  Conversation partners present during encounter Nurse  Reason for visit Routine spiritual support  OnCall Visit No   Nurse shared that patient would benefit from a Chaplain visit.  Chaplain offered a compassionate presence, empathy and reflective listening.    Rev. Rana M. Earlene Plater, MDiv Chaplain Resident [facility]

## 2023-09-05 NOTE — Progress Notes (Signed)
   09/05/23 1000  Psych Admission Type (Psych Patients Only)  Admission Status Voluntary  Psychosocial Assessment  Patient Complaints None  Eye Contact Fair;Watchful  Facial Expression Anxious;Worried (patient is anxious about possibly leaving on Friday)  Affect Appropriate to circumstance  Speech Logical/coherent  Interaction Assertive  Motor Activity Slow  Appearance/Hygiene Unremarkable  Behavior Characteristics Cooperative;Appropriate to situation  Mood Pleasant  Aggressive Behavior  Effect No apparent injury  Thought Process  Coherency WDL  Content WDL  Delusions None reported or observed  Perception WDL  Hallucination None reported or observed  Judgment WDL  Confusion None  Danger to Self  Current suicidal ideation? Denies  Self-Injurious Behavior No self-injurious ideation or behavior indicators observed or expressed   Agreement Not to Harm Self Yes  Description of Agreement Verbal  Danger to Others  Danger to Others None reported or observed  Danger to Others Abnormal  Harmful Behavior to others No threats or harm toward other people  Destructive Behavior No threats or harm toward property

## 2023-09-05 NOTE — Group Note (Signed)
 Recreation Therapy Group Note   Group Topic:General Recreation  Group Date: 09/05/2023 Start Time: 1350 End Time: 1515 Facilitators: Rosina Lowenstein, LRT, CTRS Location: Courtyard  Group Description: [employer]. LRT and patients played games of basketball, drew with chalk, and played corn hole while outside in the courtyard while getting fresh air and sunlight. Music was being played in the background. LRT and peers conversed about different games they have played before, what they do in their free time and anything else that is on their minds. LRT encouraged pts to drink water after being outside, sweating and getting their heart rate up.  Goal Area(s) Addressed: Patient will build on frustration tolerance skills. Patients will partake in a competitive play game with peers. Patients will gain knowledge of new leisure interest/hobby.    Affect/Mood: Appropriate   Participation Level: Active   Participation Quality: Independent   Behavior: Appropriate   Speech/Thought Process: Coherent   Insight: Good   Judgement: Good   Modes of Intervention: Activity   Patient Response to Interventions:  Receptive   Education Outcome:  Acknowledges education   Clinical Observations/Individualized Feedback: Lanesha was active in their participation of session activities and group discussion. Pt interacted well with LRT and peers duration of session.    Plan: Continue to engage patient in RT group sessions 2-3x/week.   Rosina Lowenstein, LRT, CTRS 09/05/2023 3:18 PM

## 2023-09-05 NOTE — Group Note (Signed)
 Recreation Therapy Group Note   Group Topic:Goal Setting  Group Date: 09/05/2023 Start Time: 1005 End Time: 1100 Facilitators: Rosina Lowenstein, LRT, CTRS Location:  Craft Room  Group Description: Product/process development scientist. Patients were given many different magazines, a glue stick, markers, and a piece of cardstock paper. LRT and pts discussed the importance of having goals in life. LRT and pts discussed the difference between short-term and long-term goals, as well as what a SMART goal is. LRT encouraged pts to create a vision board, with images they picked and then cut out with safety scissors from the magazine, for themselves, that capture their short and long-term goals. LRT encouraged pts to show and explain their vision board to the group.   Goal Area(s) Addressed:  Patient will gain knowledge of short vs. long term goals.  Patient will identify goals for themselves. Patient will practice setting SMART goals. Patient will verbalize their goals to LRT and peers.   Affect/Mood: Appropriate   Participation Level: Active and Engaged   Participation Quality: Independent   Behavior: Appropriate, Calm, and Cooperative   Speech/Thought Process: Coherent   Insight: Good   Judgement: Good   Modes of Intervention: Art, Guided Discussion, Socialization, and Support   Patient Response to Interventions:  Attentive, Engaged, Interested , and Receptive   Education Outcome:  Acknowledges education   Clinical Observations/Individualized Feedback: Crystal Black was active in their participation of session activities and group discussion. 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 Pt spontaneously contributed to group discussion while interacting well with LRT and peers duration of session.    Plan: Continue to engage patient in RT group sessions 2-3x/week.   Rosina Lowenstein, LRT, CTRS 09/05/2023 11:50 AM

## 2023-09-05 NOTE — Group Note (Signed)
 [facility] LCSW Group Therapy Note   Group Date: 09/05/2023 Start Time: 1300 End Time: 1345  Type of Therapy/Topic:  Group Therapy:  Feelings about Diagnosis  Participation Level:  Active    Description of Group:    This group will allow patients to explore their thoughts and feelings about diagnoses they have received. Patients will be guided to explore their level of understanding and acceptance of these diagnoses. Facilitator will encourage patients to process their thoughts and feelings about the reactions of others to their diagnosis, and will guide patients in identifying ways to discuss their diagnosis with significant others in their lives. This group will be process-oriented, with patients participating in exploration of their own experiences as well as giving and receiving support and challenge from other group members.   Therapeutic Goals: 1. Patient will demonstrate understanding of diagnosis as evidence by identifying two or more symptoms of the disorder:  2. Patient will be able to express two feelings regarding the diagnosis 3. Patient will demonstrate ability to communicate their needs through discussion and/or role plays  Summary of Patient Progress: Patient was present for the entirety of the group process. She was actively involved in the discussion. She shared about feeling that her prognosis has gotten better. Pt talked about her own recovery process and how she felt being in the group home saved her life. She contributed comments that furthered the discussion. Pt appeared open and receptive to feedback/comments from both her peers and facilitator.    Therapeutic Modalities:   Cognitive Behavioral Therapy Brief Therapy Feelings Identification    Glenis Smoker, LCSW

## 2023-09-05 NOTE — Progress Notes (Signed)
 Patient is still asleep. So, this writer will administer scheduled medications once she wakes up.

## 2023-09-05 NOTE — Progress Notes (Signed)
 Pt calm and pleasant during assessment denying SI/HI/AVH. Pt observed by this Clinical research associate interacting appropriately with staff and peers on the unit. Pt compliant with medication administration per MD orders. Pt given education, support, and encouragement to be active in her treatment plan. Pt being monitored Q 15 minutes for safety per unit protocol, remains safe on the unit

## 2023-09-05 NOTE — [facility] Counselor (Signed)
 CSW called the patient's guardian to follow up on planned placement.   CSW attempted to confirm patient was to transition on 09/08/2023.  She reports that this NOT true.  She reports that the patients paperwork was completed by [facility] on 08/30/2023 and they are still awaiting approval.  Per guardian patient can not be moved until approval has been received.   She reports that she will send the contact information for her supervisor who will be covering the patients case following her last day on 09/08/2023.  She confirms that the meeting will still occur on 09/06/2023 and CSW informed that she will have a coworker sit in on the meeting.   [name], MSW, LCSW 09/05/2023 11:54 AM
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
## 2023-09-06 DIAGNOSIS — F3160 Bipolar disorder, current episode mixed, unspecified: Secondary | ICD-10-CM | POA: Diagnosis not present

## 2023-09-06 LAB — GLUCOSE, CAPILLARY
Glucose-Capillary: 123 mg/dL — ABNORMAL HIGH (ref 70–99)
Glucose-Capillary: 111 mg/dL — ABNORMAL HIGH (ref 70–99)
Glucose-Capillary: 118 mg/dL — ABNORMAL HIGH (ref 70–99)
Glucose-Capillary: 176 mg/dL — ABNORMAL HIGH (ref 70–99)

## 2023-09-06 LAB — SARS CORONAVIRUS 2 BY RT PCR: SARS Coronavirus 2 by RT PCR: NEGATIVE

## 2023-09-06 NOTE — [facility] Counselor (Signed)
 CSW reached out to [facility] to get pt scheduled for psychiatry.   New pt coordinator to reach out to CSW to schedule pt.    Reynaldo Minium, MSW, [place] 09/06/2023 3:31 PM

## 2023-09-06 NOTE — Plan of Care (Signed)
  Problem: Education: Goal: Knowledge of [facility] Education information/materials will improve Outcome: Not Progressing Goal: Emotional status will improve Outcome: Not Progressing Goal: Mental status will improve Outcome: Not Progressing   
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
## 2023-09-06 NOTE — [facility] Counselor (Signed)
 CSW contact guardian, [name] [phone]) to follow up regarding aftercare needs. [name] shared that pt would need to be scheduled for outpatient mental health follow up. She confirmed that the home is in [place] to help with search. [name] also inquired if anyone had talked to [place] about getting pt clothes to her. CSW informed her that no one had currently but that contact would be established. No other concerns expressed. Contact ended without incident.   CSW phoned previous group home owner, Langley Adie [phone]) to inquire about pt's clothes. [place] asked if they could stop on their way to the house. CSW explained that pt would be transported by taxi which would go directly from hospital to pt's new home. She voiced understanding. She shared that she would see if anyone could bring pt's clothes tomorrow. No other concerns expressed. Contact ended without incident.   Vilma Meckel. Algis Greenhouse, MSW, LCSW, LCAS 09/06/2023 4:06 PM

## 2023-09-06 NOTE — Progress Notes (Signed)
   09/06/23 1454  PPD Results  Does patient have an induration at the injection site? No  Induration(mm) 0 mm  Name of Physician Notified Irwin Brakeman

## 2023-09-06 NOTE — Group Note (Signed)
 [facility] LCSW Group Therapy Note   Group Date: 09/06/2023 Start Time: 1330 End Time: 1440   Type of Therapy/Topic:  Group Therapy:  Emotion Regulation  Participation Level:  Did Not Attend   Mood:  Description of Group:    The purpose of this group is to assist patients in learning to regulate negative emotions and experience positive emotions. Patients will be guided to discuss ways in which they have been vulnerable to their negative emotions. These vulnerabilities will be juxtaposed with experiences of positive emotions or situations, and patients challenged to use positive emotions to combat negative ones. Special emphasis will be placed on coping with negative emotions in conflict situations, and patients will process healthy conflict resolution skills.  Therapeutic Goals: Patient will identify two positive emotions or experiences to reflect on in order to balance out negative emotions:  Patient will label two or more emotions that they find the most difficult to experience:  Patient will be able to demonstrate positive conflict resolution skills through discussion or role plays:   Summary of Patient Progress:   Patient did not attend group.     Therapeutic Modalities:   Cognitive Behavioral Therapy Feelings Identification Dialectical Behavioral Therapy   Lowry Ram, LCSW

## 2023-09-06 NOTE — Plan of Care (Signed)
  Problem: Education: Goal: Emotional status will improve Outcome: Progressing   Problem: Education: Goal: Mental status will improve Outcome: Progressing   Problem: Education: Goal: Verbalization of understanding the information provided will improve Outcome: Progressing   Problem: Education: Goal: Knowledge of  General Education information/materials will improve Outcome: Progressing   Problem: Activity: Goal: Interest or engagement in activities will improve Outcome: Progressing   Problem: Activity: Goal: Sleeping patterns will improve Outcome: Progressing

## 2023-09-06 NOTE — Progress Notes (Signed)
   09/06/23 1100  Psych Admission Type (Psych Patients Only)  Admission Status Voluntary  Psychosocial Assessment  Patient Complaints None  Eye Contact Fair  Facial Expression Animated  Affect Appropriate to circumstance  Speech Logical/coherent  Interaction Assertive  Motor Activity Slow  Appearance/Hygiene Unremarkable  Behavior Characteristics Cooperative  Mood Pleasant  Aggressive Behavior  Effect No apparent injury  Thought Process  Coherency WDL  Content WDL  Delusions None reported or observed  Perception WDL  Hallucination None reported or observed  Judgment Impaired  Confusion None  Danger to Self  Current suicidal ideation? Denies  Self-Injurious Behavior No self-injurious ideation or behavior indicators observed or expressed

## 2023-09-06 NOTE — Progress Notes (Signed)
   09/06/23 2131  PPD Results  Does patient have an induration at the injection site? No  Induration(mm) 0 mm  Name of Physician Notified Irwin Brakeman

## 2023-09-06 NOTE — Group Note (Signed)
 Date:  09/06/2023 Time:  12:44 AM  Group Topic/Focus:  Wrap-Up Group:   The focus of this group is to help patients review their daily goal of treatment and discuss progress on daily workbooks.    Participation Level:  Active  Participation Quality:  Appropriate and Attentive  Affect:  Appropriate  Cognitive:  Alert and Appropriate  Insight: Appropriate, Good, and Improving  Engagement in Group:  Developing/Improving and Engaged  Modes of Intervention:  Activity, Rapport Building, Socialization, and Support  Additional Comments:     Crystal Black 09/06/2023, 12:44 AM

## 2023-09-06 NOTE — Plan of Care (Signed)
  Problem: Education: Goal: Knowledge of [facility] Education information/materials will improve Outcome: Progressing   Problem: Education: Goal: Mental status will improve Outcome: Progressing   Problem: Coping: Goal: Ability to verbalize frustrations and anger appropriately will improve Outcome: Progressing   Problem: Health Behavior/Discharge Planning: Goal: Identification of resources available to assist in meeting health care needs will improve Outcome: Progressing   Problem: Safety: Goal: Periods of time without injury will increase Outcome: Progressing

## 2023-09-06 NOTE — [facility] Group Notes (Signed)
 [facility] Group Notes:  (Nursing/MHT/Case Management/Adjunct)  Date:  09/06/2023  Time:  8:45 PM  Type of Therapy:   wrap up group  Participation Level:  Did Not Attend  Participation Quality:   Did not attend  Affect:  Did not attend  Cognitive:   Did not attend  Insight:  None  Engagement in Group:  None  Modes of Intervention:   self awareness and problem solving  Summary of Progress/Problems: No problem because patient did not attend Crystal Black 09/06/2023, 8:45 PM

## 2023-09-07 ENCOUNTER — Other Ambulatory Visit: Payer: Self-pay

## 2023-09-07 DIAGNOSIS — F3160 Bipolar disorder, current episode mixed, unspecified: Secondary | ICD-10-CM | POA: Diagnosis not present

## 2023-09-07 LAB — GLUCOSE, CAPILLARY
Glucose-Capillary: 123 mg/dL — ABNORMAL HIGH (ref 70–99)
Glucose-Capillary: 109 mg/dL — ABNORMAL HIGH (ref 70–99)
Glucose-Capillary: 115 mg/dL — ABNORMAL HIGH (ref 70–99)
Glucose-Capillary: 114 mg/dL — ABNORMAL HIGH (ref 70–99)

## 2023-09-07 MED ORDER — CLONAZEPAM 0.5 MG PO TABS
0.2500 mg | ORAL_TABLET | Freq: Every day | ORAL | 0 refills | Status: DC
  Filled 2023-09-07: qty 15, 30d supply, fill #0

## 2023-09-07 MED ORDER — HYDROXYZINE HCL 50 MG PO TABS
50.0000 mg | ORAL_TABLET | Freq: Four times a day (QID) | ORAL | 0 refills | Status: DC | PRN
  Filled 2023-09-07: qty 30, 8d supply, fill #0

## 2023-09-07 MED ORDER — INSULIN ASPART 100 UNIT/ML IJ SOLN
0.0000 [IU] | Freq: Three times a day (TID) | INTRAMUSCULAR | 11 refills | Status: DC
  Filled 2023-09-07: qty 10, 27d supply, fill #0

## 2023-09-07 MED ORDER — METFORMIN HCL 500 MG PO TABS
500.0000 mg | ORAL_TABLET | Freq: Two times a day (BID) | ORAL | 0 refills | Status: AC
  Filled 2023-09-07: qty 60, 30d supply, fill #0

## 2023-09-07 MED ORDER — FAMOTIDINE 20 MG PO TABS
20.0000 mg | ORAL_TABLET | Freq: Every day | ORAL | 0 refills | Status: AC
  Filled 2023-09-07 (×2): qty 30, 30d supply, fill #0

## 2023-09-07 MED ORDER — DARUNAVIR-COBICISTAT 800-150 MG PO TABS
1.0000 | ORAL_TABLET | Freq: Every day | ORAL | 0 refills | Status: DC
  Filled 2023-09-07 (×2): qty 30, 30d supply, fill #0

## 2023-09-07 MED ORDER — DOLUTEGRAVIR SODIUM 50 MG PO TABS
50.0000 mg | ORAL_TABLET | Freq: Every day | ORAL | 0 refills | Status: AC
  Filled 2023-09-07 (×2): qty 30, 30d supply, fill #0

## 2023-09-07 MED ORDER — FLUOXETINE HCL 20 MG PO CAPS
20.0000 mg | ORAL_CAPSULE | Freq: Every day | ORAL | 0 refills | Status: DC
  Filled 2023-09-07: qty 30, 30d supply, fill #0

## 2023-09-07 MED ORDER — ARIPIPRAZOLE ER 400 MG IM SRER
300.0000 mg | INTRAMUSCULAR | 0 refills | Status: DC
  Filled 2023-09-07: qty 1.5, 28d supply, fill #0

## 2023-09-07 MED ORDER — NICOTINE POLACRILEX 4 MG MT GUM
4.0000 mg | CHEWING_GUM | OROMUCOSAL | 0 refills | Status: AC | PRN
  Filled 2023-09-07: qty 110, 30d supply, fill #0

## 2023-09-07 MED ORDER — LINACLOTIDE 290 MCG PO CAPS
290.0000 ug | ORAL_CAPSULE | Freq: Every day | ORAL | 0 refills | Status: AC
  Filled 2023-09-07: qty 30, 30d supply, fill #0

## 2023-09-07 MED ORDER — PRAZOSIN HCL 5 MG PO CAPS
10.0000 mg | ORAL_CAPSULE | Freq: Every day | ORAL | 0 refills | Status: DC
  Filled 2023-09-07 (×2): qty 60, 30d supply, fill #0

## 2023-09-07 MED ORDER — EPINEPHRINE 0.3 MG/0.3ML IJ SOAJ
0.3000 mg | INTRAMUSCULAR | 0 refills | Status: AC | PRN
  Filled 2023-09-07: qty 2, 1d supply, fill #0

## 2023-09-07 MED ORDER — POLYETHYLENE GLYCOL 3350 17 GM/SCOOP PO POWD
17.0000 g | Freq: Every day | ORAL | 0 refills | Status: AC
  Filled 2023-09-07: qty 238, 14d supply, fill #0

## 2023-09-07 MED ORDER — ALBUTEROL SULFATE HFA 108 (90 BASE) MCG/ACT IN AERS
2.0000 | INHALATION_SPRAY | RESPIRATORY_TRACT | 0 refills | Status: AC | PRN
  Filled 2023-09-07: qty 18, 25d supply, fill #0

## 2023-09-07 MED ORDER — MONTELUKAST SODIUM 10 MG PO TABS
5.0000 mg | ORAL_TABLET | Freq: Every day | ORAL | 0 refills | Status: AC
  Filled 2023-09-07 (×2): qty 15, 30d supply, fill #0

## 2023-09-07 MED ORDER — DIVALPROEX SODIUM ER 500 MG PO TB24
500.0000 mg | ORAL_TABLET | Freq: Two times a day (BID) | ORAL | 0 refills | Status: AC
  Filled 2023-09-07 (×2): qty 60, 30d supply, fill #0

## 2023-09-07 MED ORDER — TRAZODONE HCL 50 MG PO TABS
50.0000 mg | ORAL_TABLET | Freq: Every evening | ORAL | 0 refills | Status: DC | PRN
  Filled 2023-09-07 (×2): qty 30, 30d supply, fill #0

## 2023-09-07 MED ORDER — DOCUSATE SODIUM 100 MG PO CAPS
200.0000 mg | ORAL_CAPSULE | Freq: Every day | ORAL | 0 refills | Status: DC
  Filled 2023-09-07: qty 60, 30d supply, fill #0

## 2023-09-07 NOTE — Plan of Care (Signed)
  Problem: Education: Goal: Knowledge of [facility] Education information/materials will improve Outcome: Progressing Goal: Emotional status will improve Outcome: Progressing Goal: Mental status will improve Outcome: Progressing Goal: Verbalization of understanding the information provided will improve Outcome: Progressing   Problem: Activity: Goal: Interest or engagement in activities will improve Outcome: Progressing Goal: Sleeping patterns will improve Outcome: Progressing   Problem: Coping: Goal: Ability to verbalize frustrations and anger appropriately will improve Outcome: Progressing Goal: Ability to demonstrate self-control will improve Outcome: Progressing   Problem: Health Behavior/Discharge Planning: Goal: Identification of resources available to assist in meeting health care needs will improve Outcome: Progressing Goal: Compliance with treatment plan for underlying cause of condition will improve Outcome: Progressing   Problem: Physical Regulation: Goal: Ability to maintain clinical measurements within normal limits will improve Outcome: Progressing   Problem: Safety: Goal: Periods of time without injury will increase Outcome: Progressing   Problem: Education: Goal: Utilization of techniques to improve thought processes will improve Outcome: Progressing Goal: Knowledge of the prescribed therapeutic regimen will improve Outcome: Progressing   Problem: Activity: Goal: Interest or engagement in leisure activities will improve Outcome: Progressing Goal: Imbalance in normal sleep/wake cycle will improve Outcome: Progressing   Problem: Coping: Goal: Coping ability will improve Outcome: Progressing Goal: Will verbalize feelings Outcome: Progressing   Problem: Health Behavior/Discharge Planning: Goal: Ability to make decisions will improve Outcome: Progressing Goal: Compliance with therapeutic regimen will improve Outcome: Progressing    Problem: Role Relationship: Goal: Will demonstrate positive changes in social behaviors and relationships Outcome: Progressing   Problem: Safety: Goal: Ability to disclose and discuss suicidal ideas will improve Outcome: Progressing Goal: Ability to identify and utilize support systems that promote safety will improve Outcome: Progressing   Problem: Self-Concept: Goal: Will verbalize positive feelings about self Outcome: Progressing Goal: Level of anxiety will decrease Outcome: Progressing   Problem: Education: Goal: Knowledge of General Education information will improve Description: Including pain rating scale, medication(s)/side effects and non-pharmacologic comfort measures Outcome: Progressing   Problem: Health Behavior/Discharge Planning: Goal: Ability to manage health-related needs will improve Outcome: Progressing   Problem: Education: Goal: Ability to describe self-care measures that may prevent or decrease complications (Diabetes Survival Skills Education) will improve Outcome: Progressing Goal: Individualized Educational Video(s) Outcome: Progressing   Problem: Coping: Goal: Ability to adjust to condition or change in health will improve Outcome: Progressing   Problem: Fluid Volume: Goal: Ability to maintain a balanced intake and output will improve Outcome: Progressing   Problem: Health Behavior/Discharge Planning: Goal: Ability to identify and utilize available resources and services will improve Outcome: Progressing Goal: Ability to manage health-related needs will improve Outcome: Progressing   Problem: Metabolic: Goal: Ability to maintain appropriate glucose levels will improve Outcome: Progressing   Problem: Nutritional: Goal: Maintenance of adequate nutrition will improve Outcome: Progressing Goal: Progress toward achieving an optimal weight will improve Outcome: Progressing   Problem: Skin Integrity: Goal: Risk for impaired skin integrity  will decrease Outcome: Progressing   Problem: Tissue Perfusion: Goal: Adequacy of tissue perfusion will improve Outcome: Progressing

## 2023-09-07 NOTE — BHH Counselor (Signed)
 CSW reached out to the patient's former group home on getting the patient's clothes.  CSW left HIPAA compliant voicemail.  [name], MSW, LCSW 09/07/2023 10:18 AM

## 2023-09-07 NOTE — Progress Notes (Signed)
   09/07/23 0000  Psych Admission Type (Psych Patients Only)  Admission Status Voluntary  Psychosocial Assessment  Patient Complaints None  Eye Contact Fair  Facial Expression Animated  Affect Appropriate to circumstance  Speech Logical/coherent  Interaction Assertive  Motor Activity Slow  Appearance/Hygiene Unremarkable  Behavior Characteristics Cooperative  Mood Pleasant  Aggressive Behavior  Effect No apparent injury  Thought Process  Coherency WDL  Content WDL  Delusions None reported or observed  Perception WDL  Hallucination None reported or observed  Judgment Impaired  Confusion None  Danger to Self  Current suicidal ideation? Denies  Danger to Others  Danger to Others None reported or observed  Danger to Others Abnormal  Harmful Behavior to others No threats or harm toward other people  Description of Harmful Behavior none  Destructive Behavior No threats or harm toward property  Description of Destructive Behavior none

## 2023-09-07 NOTE — BH IP Treatment Plan (Signed)
 Interdisciplinary Treatment and Diagnostic Plan Update  09/07/2023 Time of Session: 9:00 AM Crystal Black MRN: [id number]  Principal Diagnosis: Bipolar I disorder, most recent episode mixed (HCC)  Secondary Diagnoses: Principal Problem:   Bipolar I disorder, most recent episode mixed (HCC) Active Problems:   PTSD (post-traumatic stress disorder)   HIV disease (HCC)   Thrombosed hemorrhoids   Abdominal pain   Current Medications:  Current Facility-Administered Medications  Medication Dose Route Frequency Provider Last Rate Last Admin   acetaminophen (TYLENOL) tablet 650 mg  650 mg Oral Q6H PRN Verner Chol, MD   650 mg at 09/04/23 1846   albuterol (VENTOLIN HFA) 108 (90 Base) MCG/ACT inhaler 2 puff  2 puff Inhalation Q4H PRN [name], NP   2 puff at 08/23/23 1529   alum & mag hydroxide-simeth (MAALOX/MYLANTA) 200-200-20 MG/5ML suspension 30 mL  30 mL Oral Q4H PRN McLauchlin, [name], NP   30 mL at 08/02/23 2036   ARIPiprazole ER (ABILIFY MAINTENA) injection 300 mg  300 mg Intramuscular Q28 days [name], NP   300 mg at 09/04/23 0831   clonazePAM (KLONOPIN) disintegrating tablet 0.25 mg  0.25 mg Oral Daily [name], NP   0.25 mg at 09/07/23 0820   darunavir-cobicistat (PREZCOBIX) 800-150 MG per tablet 1 tablet  1 tablet Oral Q breakfast [name], NP   1 tablet at 09/07/23 0820   haloperidol (HALDOL) tablet 5 mg  5 mg Oral TID PRN McLauchlin, [name], NP       And   diphenhydrAMINE (BENADRYL) capsule 50 mg  50 mg Oral TID PRN McLauchlin, [name], NP   50 mg at 09/05/23 2116   haloperidol (HALDOL) tablet 5 mg  5 mg Oral TID PRN [name], NP   5 mg at 08/14/23 1942   And   diphenhydrAMINE (BENADRYL) capsule 50 mg  50 mg Oral TID PRN [name], NP   50 mg at 08/14/23 1942   haloperidol lactate (HALDOL) injection 5 mg  5 mg Intramuscular TID PRN McLauchlin, [name], NP       And   diphenhydrAMINE (BENADRYL) injection 50 mg  50 mg Intramuscular TID PRN  McLauchlin, [name], NP   50 mg at 08/04/23 1412   And   LORazepam (ATIVAN) injection 2 mg  2 mg Intramuscular TID PRN McLauchlin, [name], NP   2 mg at 08/04/23 1412   haloperidol lactate (HALDOL) injection 10 mg  10 mg Intramuscular TID PRN McLauchlin, [name], NP       And   diphenhydrAMINE (BENADRYL) injection 50 mg  50 mg Intramuscular TID PRN McLauchlin, [name], NP       And   LORazepam (ATIVAN) injection 2 mg  2 mg Intramuscular TID PRN McLauchlin, [name], NP       haloperidol lactate (HALDOL) injection 5 mg  5 mg Intramuscular TID PRN [name], NP       And   diphenhydrAMINE (BENADRYL) injection 50 mg  50 mg Intramuscular TID PRN [name], NP       And   LORazepam (ATIVAN) injection 2 mg  2 mg Intramuscular TID PRN [name], NP       haloperidol lactate (HALDOL) injection 10 mg  10 mg Intramuscular TID PRN [name], NP   10 mg at 08/17/23 1800   And   diphenhydrAMINE (BENADRYL) injection 50 mg  50 mg Intramuscular TID PRN [name], NP   50 mg at 08/17/23 1800  And   LORazepam (ATIVAN) injection 2 mg  2 mg Intramuscular TID PRN [name], NP   2 mg at 08/17/23 1801   divalproex (DEPAKOTE ER) 24 hr tablet 500 mg  500 mg Oral BID [name], NP   500 mg at 09/07/23 0820   docusate sodium (COLACE) capsule 200 mg  200 mg Oral Daily [name], [name], NP   200 mg at 09/07/23 0820   dolutegravir (TIVICAY) tablet 50 mg  50 mg Oral Daily [name], NP   50 mg at 09/07/23 0820   EPINEPHrine (EPI-PEN) injection 0.3 mg  0.3 mg Intramuscular Once [name], NP       famotidine (PEPCID) tablet 20 mg  20 mg Oral Daily [name], NP   20 mg at 09/07/23 0820   feeding supplement (GLUCERNA SHAKE) (GLUCERNA SHAKE) liquid 237 mL  237 mL Oral TID WC [name], NP   237 mL at 09/03/23 1756   FLUoxetine (PROZAC) capsule 20 mg  20 mg Oral Daily [name], NP   20 mg at 09/07/23 0820   hydrocortisone (ANUSOL-HC) 2.5 %  rectal cream   Rectal BID [name], MD   1 Application at 08/27/23 1724   hydrOXYzine (ATARAX) tablet 50 mg  50 mg Oral Q6H PRN [name], NP   50 mg at 09/04/23 1803   influenza vac split trivalent PF (FLULAVAL) injection 0.5 mL  0.5 mL Intramuscular Tomorrow-1000 [name], MD       insulin aspart (novoLOG) injection 0-9 Units  0-9 Units Subcutaneous TID WC & HS [name], NP   1 Units at 09/07/23 9147   linaclotide (LINZESS) capsule 290 mcg  290 mcg Oral QAC breakfast [name], NP   290 mcg at 09/07/23 0820   magnesium hydroxide (MILK OF MAGNESIA) suspension 30 mL  30 mL Oral Daily PRN McLauchlin, [name], NP   30 mL at 09/06/23 2208   melatonin tablet 5 mg  5 mg Oral QHS [name], NP   5 mg at 09/02/23 2110   menthol-cetylpyridinium (CEPACOL) lozenge 3 mg  1 lozenge Oral PRN Tingling, [name], [name]   3 mg at 09/05/23 1346   metFORMIN (GLUCOPHAGE) tablet 500 mg  500 mg Oral BID WC [name], NP   500 mg at 09/07/23 0820   methocarbamol (ROBAXIN) tablet 500 mg  500 mg Oral QHS [name], NP   500 mg at 09/06/23 2044   montelukast (SINGULAIR) tablet 5 mg  5 mg Oral QHS [name], [name], DO   5 mg at 09/06/23 2044   nicotine polacrilex (NICORETTE) gum 4 mg  4 mg Oral PRN [name], NP   4 mg at 09/07/23 0820   ondansetron (ZOFRAN) tablet 4 mg  4 mg Oral Q8H PRN [name], NP   4 mg at 08/27/23 1903   polyethylene glycol (MIRALAX / GLYCOLAX) packet 17 g  17 g Oral Daily [name], [name], NP   17 g at 09/06/23 0835   prazosin (MINIPRESS) capsule 10 mg  10 mg Oral QHS [name], NP   10 mg at 09/06/23 2044   traZODone (DESYREL) tablet 50 mg  50 mg Oral QHS PRN McLauchlin, [name], NP   50 mg at 08/29/23 2101   PTA Medications: Medications Prior to Admission  Medication Sig Dispense Refill Last Dose/Taking   albuterol (VENTOLIN HFA) 108 (90 Base) MCG/ACT inhaler Inhale 2  puffs into the lungs every 4 (four) hours as needed for wheezing or shortness  of breath.   Taking As Needed   ARIPiprazole (ABILIFY) 2 MG tablet Take 1 tablet (2 mg total) by mouth daily. 12 tablet 0 07/17/2023 at  8:00 AM   ARIPiprazole ER (ABILIFY MAINTENA) 400 MG SRER injection Inject 1.5 mLs (300 mg total) into the muscle every 28 (twenty-eight) days. 1.5 mL 0 06/30/2023   benztropine (COGENTIN) 1 MG tablet Take 1 tablet (1 mg total) by mouth 2 (two) times daily. 60 tablet 0 07/17/2023 at  8:00 PM   darunavir-cobicistat (PREZCOBIX) 800-150 MG tablet Take 1 tablet by mouth daily with breakfast. Swallow whole. Do NOT crush, break or chew tablets. Take with food. 30 tablet 1 07/17/2023 at  8:00 AM   divalproex (DEPAKOTE ER) 250 MG 24 hr tablet Take 1 tablet (250 mg total) by mouth 2 (two) times daily. 60 tablet 0 07/17/2023 at  8:00 PM   docusate sodium (COLACE) 100 MG capsule Take 300 mg by mouth daily.   07/17/2023 at  8:00 AM   dolutegravir (TIVICAY) 50 MG tablet Take 1 tablet (50 mg total) by mouth daily. 30 tablet 1 07/17/2023 at  8:00 AM   EPINEPHrine 0.3 mg/0.3 mL IJ SOAJ injection Inject 0.3 mg into the muscle as needed for anaphylaxis.   Taking As Needed   famotidine (PEPCID) 20 MG tablet Take 20 mg by mouth 2 (two) times daily.   Taking   FIBER ADULT GUMMIES PO Take 1 tablet by mouth in the morning and at bedtime.   07/17/2023 at  8:00 PM   FLUoxetine (PROZAC) 40 MG capsule Take 40 mg by mouth at bedtime.   07/17/2023 at  8:00 PM   glycerin adult 2 g suppository Place 1 suppository rectally as needed for constipation.   Taking As Needed   hydrOXYzine (ATARAX) 25 MG tablet Take 1 tablet (25 mg total) by mouth 3 (three) times daily as needed. 60 tablet 1 Taking As Needed   ibuprofen (ADVIL) 800 MG tablet Take 1 tablet (800 mg total) by mouth every 8 (eight) hours as needed for moderate pain. 15 tablet 0 Taking As Needed   ketoconazole (NIZORAL) 2 % shampoo Apply 1 Application topically. Monday/ Wednesday/ Friday   07/17/2023 at  3:00 PM   linaclotide (LINZESS) 290 MCG CAPS  capsule Take 290 mcg by mouth daily before breakfast.   07/17/2023 at  7:00 AM   magnesium citrate SOLN Take 1 Bottle by mouth daily as needed for severe constipation.   Taking As Needed   melatonin 5 MG TABS Take 0.5 tablets (2.5 mg total) by mouth at bedtime. 15 tablet 0 07/17/2023 at  8:00 PM   methocarbamol (ROBAXIN) 500 MG tablet Take 500 mg by mouth at bedtime.   Taking   montelukast (SINGULAIR) 10 MG tablet Take 10 mg by mouth daily.   07/17/2023 at  8:00 AM   nicotine (NICODERM CQ - DOSED IN MG/24 HR) 7 mg/24hr patch Place 1 patch (7 mg total) onto the skin daily. 360 patch 0 07/17/2023 at  8:00 AM   nicotine polacrilex (NICORETTE) 2 MG gum Take 2 mg by mouth as needed for smoking cessation.   Taking As Needed   nicotine polacrilex (NICOTINE MINI) 4 MG lozenge Take 1 lozenge (4 mg total) by mouth as needed. 100 tablet 0 Taking As Needed   ondansetron (ZOFRAN-ODT) 4 MG disintegrating tablet Take 1 tablet (4 mg total) by mouth every 8 (eight)  hours as needed for nausea or vomiting. 20 tablet 0 Taking As Needed   prazosin (MINIPRESS) 5 MG capsule Take 1 capsule (5 mg total) by mouth at bedtime. 30 capsule 1 07/17/2023 at  8:00 PM   risperiDONE (RISPERDAL) 0.5 MG tablet Take 0.5 mg by mouth 2 (two) times daily. 0800/1600   07/17/2023 at  4:00 PM   topiramate (TOPAMAX) 50 MG tablet Take 1 tablet (50 mg total) by mouth 2 (two) times daily. (Patient taking differently: Take 50 mg by mouth at bedtime.) 60 tablet 1 07/17/2023 at  8:00 PM   traZODone (DESYREL) 50 MG tablet Take 50 mg by mouth at bedtime.   07/17/2023 at  8:00 PM   etonogestrel (NEXPLANON) 68 MG IMPL implant 1 each (68 mg total) by Subdermal route once for 1 dose. 1 each 0     Patient Stressors:    Patient Strengths:    Treatment Modalities: Medication Management, Group therapy, Case management,  1 to 1 session with clinician, Psychoeducation, Recreational therapy.   Physician Treatment Plan for Primary Diagnosis: Bipolar I disorder,  most recent episode mixed (HCC) Long Term Goal(s): Improvement in symptoms so as ready for discharge   Short Term Goals: Ability to identify changes in lifestyle to reduce recurrence of condition will improve Ability to verbalize feelings will improve Ability to disclose and discuss suicidal ideas Ability to demonstrate self-control will improve Ability to identify and develop effective coping behaviors will improve Ability to maintain clinical measurements within normal limits will improve Compliance with prescribed medications will improve Ability to identify triggers associated with substance abuse/mental health issues will improve  Medication Management: Evaluate patient's response, side effects, and tolerance of medication regimen.  Therapeutic Interventions: 1 to 1 sessions, Unit Group sessions and Medication administration.  Evaluation of Outcomes: Progressing  Physician Treatment Plan for Secondary Diagnosis: Principal Problem:   Bipolar I disorder, most recent episode mixed (HCC) Active Problems:   PTSD (post-traumatic stress disorder)   HIV disease (HCC)   Thrombosed hemorrhoids   Abdominal pain  Long Term Goal(s): Improvement in symptoms so as ready for discharge   Short Term Goals: Ability to identify changes in lifestyle to reduce recurrence of condition will improve Ability to verbalize feelings will improve Ability to disclose and discuss suicidal ideas Ability to demonstrate self-control will improve Ability to identify and develop effective coping behaviors will improve Ability to maintain clinical measurements within normal limits will improve Compliance with prescribed medications will improve Ability to identify triggers associated with substance abuse/mental health issues will improve     Medication Management: Evaluate patient's response, side effects, and tolerance of medication regimen.  Therapeutic Interventions: 1 to 1 sessions, Unit Group sessions and  Medication administration.  Evaluation of Outcomes: Progressing   RN Treatment Plan for Primary Diagnosis: Bipolar I disorder, most recent episode mixed (HCC) Long Term Goal(s): Knowledge of disease and therapeutic regimen to maintain health will improve  Short Term Goals: Ability to remain free from injury will improve, Ability to verbalize frustration and anger appropriately will improve, Ability to demonstrate self-control, Ability to participate in decision making will improve, Ability to verbalize feelings will improve, Ability to disclose and discuss suicidal ideas, Ability to identify and develop effective coping behaviors will improve, and Compliance with prescribed medications will improve    Medication Management: RN will administer medications as ordered by provider, will assess and evaluate patient's response and provide education to patient for prescribed medication. RN will report any adverse and/or side effects to  prescribing provider.  Therapeutic Interventions: 1 on 1 counseling sessions, Psychoeducation, Medication administration, Evaluate responses to treatment, Monitor vital signs and CBGs as ordered, Perform/monitor CIWA, COWS, AIMS and Fall Risk screenings as ordered, Perform wound care treatments as ordered.  Evaluation of Outcomes: Progressing   LCSW Treatment Plan for Primary Diagnosis: Bipolar I disorder, most recent episode mixed (HCC) Long Term Goal(s): Safe transition to appropriate next level of care at discharge, Engage patient in therapeutic group addressing interpersonal concerns.  Short Term Goals:  Engage patient in aftercare planning with referrals and resources, Increase social support, Increase ability to appropriately verbalize feelings, Increase emotional regulation, Facilitate acceptance of mental health diagnosis and concerns, and Increase skills for wellness and recovery   Therapeutic Interventions: Assess for all discharge needs, 1 to 1 time with  Social worker, Explore available resources and support systems, Assess for adequacy in community support network, Educate family and significant other(s) on suicide prevention, Complete Psychosocial Assessment, Interpersonal group therapy.  Evaluation of Outcomes: Progressing   Progress in Treatment: Attending groups: No. Update 09/07/23: Yes and no.  Participating in groups: No. Update 09/07/23: Yes and no.  Taking medication as prescribed: Yes. Toleration medication: Yes. Family/Significant other contact made: Yes, individual(s) contacted:   guardian, [name], [phone]  Patient understands diagnosis: Yes. Discussing patient identified problems/goals with staff: Yes. Medical problems stabilized or resolved: Yes. Denies suicidal/homicidal ideation: Yes. Issues/concerns per patient self-inventory: No.  Update 09/07/23: No.  Other:  None Update 09/07/23: None  New problem(s) identified: No, Describe:  none identified. Update 08/03/2023:  No changes at this time. Update 08/08/2023:  No changes at this time. Update 08/13/2023: No changes at this time. Update 08/18/23: Patient has been navigating her complex feelings while interviewing for placement. Update 08/23/2023:  No changes at this time. Update 08/28/23: No changes at this time. Update 09/02/2023: No changes at this time. Update 09/07/23: No changes at this time.     New Short Term/Long Term Goal(s):medication management for mood stabilization; elimination of SI thoughts; development of comprehensive mental wellness plan. Update 07/24/23: No changes at this time. Update: 07/29/2023: No changes at this time.  Update 08/03/2023:  No changes at this time. Update 08/08/2023:  No changes at this time.  Update 08/13/2023: No changes at this time. Update 08/18/2023: No changes at this time. Update 08/23/2023:  No changes at this time. Update 08/28/23: No changes at this time.   Update 09/02/2023: No changes at this time. Update 09/07/23: No changes at this  time.   Patient Goals:  Pt declined to participate in treatment team meeting. Update 07/24/23: No changes at this time.  Update 08/03/2023:  No changes at this time. Update 08/08/2023:  No changes at this time.   Update 08/13/2023: No changes at this time. Update 08/18/2023: No changes at this time.  Update 08/23/2023:  No changes at this time. Update 08/28/23: No changes at this time.   Update 09/02/2023: No changes at this time. Update 09/02/2023: No changes at this time.    Discharge Plan or Barriers: CSW will assist pt/guardian with development of an appropriate aftercare/discharge plan. Update 07/24/23: Guardian notified team that pt will not be returning to group home. Update: 07/29/2023: No changes at this time.  Update 08/03/2023:  Patient's guardian continues to look for placement for the patient.  Patient remains safe on the unit. Update 08/08/2023:  Patient continues to wait for placement.  CSW has sent FL2 to the patient's guardian to assist in bed placement.  Update 08/13/2023: The patient interview for [facility] was not successful, she has interviews with her guardian and other [facility] providers coming up. Update 08/18/2023: Patient has had interview with [facility] provider. Team is still awaiting facility's decision. Update 08/23/2023:  Potential placement has been located for patient.  Barriers have arisen due to communication barriers with the prospective [facility] provider.  CSW is working on plan to address these. Update 08/28/23: Potential placement face to face interview completed. Still awaiting concrete decision regarding placement.  Update 09/02/2023: No changes at this time. Update 09/02/2023: Patient has been approved for placement. Patient is due to discharge 09/08/23. CSW team has been working on outpatient referrals for continued treatment and care coordination.     Reason for Continuation of Hospitalization:  Aggression Depression Medication stabilization Suicidal ideation  Estimated Length of Stay: 1-7 days  Update 07/24/23: No changes at this time. Update: 07/29/2023: No changes at this time  Update 08/03/2023:  TBD Update 08/08/2023:  TBD  Update 08/13/2023: TBD  Update 08/18/2023: TBD Update 08/23/2023:  TBD Update 08/28/23: TBD   Update 09/02/2023: TBD Update 09/02/2023: Pending discharge 09/08/23  Last 3 [place] Suicide Severity Risk Score: Flowsheet Row Admission (Current) from 07/18/2023 in [facility] BE[facility]AL [facility]ICINE ED from 07/11/2023 in [facility] Emergency Department at [facility] Admission (Discharged) from 07/05/2023 in [facility] INPATIENT BE[facility]AL MEDICINE  C-SSRS RISK CATEGORY High Risk Error: Q3, 4, or 5 should not be populated when Q2 is No High Risk       Last PHQ 2/9 Scores:    05/04/2023    9:21 AM 02/16/2023    9:49 AM 11/17/2022   10:53 AM  Depression screen PHQ 2/9  Decreased Interest 0 0 0  Down, Depressed, Hopeless 0 0 1  PHQ - 2 Score 0 0 1    Scribe for Treatment Team: Lowry Ram, LCSW 09/07/2023 9:41 AM

## 2023-09-07 NOTE — BHH Counselor (Signed)
 CSW received return call from [facility] and they report they do not take [employer].   Reynaldo Minium, MSW, [place] 09/07/2023 1:26 PM

## 2023-09-07 NOTE — BHH Counselor (Signed)
 CSW contacted [employer] [facility], CSW left HIPAA compliant VM requesting return call.    Reynaldo Minium, MSW, [facility] 09/07/2023 11:52 AM

## 2023-09-07 NOTE — Progress Notes (Signed)
   09/07/23 0900  Psych Admission Type (Psych Patients Only)  Admission Status Voluntary  Psychosocial Assessment  Patient Complaints Sleep disturbance (patient states that she slept "good, but it took me a while to go to sleep, because I didn't want to take any sleep medicine.)  Eye Contact Fair  Facial Expression Anxious  Affect Appropriate to circumstance  Speech Logical/coherent  Interaction Assertive  Motor Activity Slow  Appearance/Hygiene In scrubs;Unremarkable  Behavior Characteristics Cooperative;Appropriate to situation  Mood Pleasant (patient states that she is just "waiting on my clothes", reporting that she is discharging tomorrow.)  Aggressive Behavior  Effect No apparent injury  Thought Process  Coherency WDL  Content WDL  Delusions None reported or observed  Perception WDL  Hallucination None reported or observed  Judgment WDL  Confusion None  Danger to Self  Current suicidal ideation? Denies  Self-Injurious Behavior No self-injurious ideation or behavior indicators observed or expressed   Agreement Not to Harm Self Yes  Description of Agreement Verbal  Danger to Others  Danger to Others None reported or observed  Danger to Others Abnormal  Harmful Behavior to others No threats or harm toward other people  Destructive Behavior No threats or harm toward property

## 2023-09-07 NOTE — Group Note (Signed)
 LCSW Group Therapy Note  Group Date: 09/07/2023 Start Time: 1400 End Time: 1500   Type of Therapy and Topic:  Group Therapy: Anger Cues and Responses  Participation Level:  Active   Description of Group:   In this group, patients learned how to recognize the physical, cognitive, emotional, and behavioral responses they have to anger-provoking situations.  They identified a recent time they became angry and how they reacted.  They analyzed how their reaction was possibly beneficial and how it was possibly unhelpful.  The group discussed a variety of healthier coping skills that could help with such a situation in the future.  Focus was placed on how helpful it is to recognize the underlying emotions to our anger, because working on those can lead to a more permanent solution as well as our ability to focus on the important rather than the urgent.  Therapeutic Goals: Patients will remember their last incident of anger and how they felt emotionally and physically, what their thoughts were at the time, and how they behaved. Patients will identify how their behavior at that time worked for them, as well as how it worked against them. Patients will explore possible new behaviors to use in future anger situations. Patients will learn that anger itself is normal and cannot be eliminated, and that healthier reactions can assist with resolving conflict rather than worsening situations.  Summary of Patient Progress:   Patient was active during the group. She was able to discuss with the group how anger affects her. She was supportive of other group members.  She demonstrated fair insight into the subject matter, was respectful of peers, and participated throughout the entire session.  Therapeutic Modalities:   Cognitive Behavioral Therapy    Harden Mo, LCSW 09/07/2023  3:15 PM

## 2023-09-07 NOTE — [facility] Counselor (Signed)
 CSW reached out to patient's guardian for assistance in obtaining copy of pt's Medicaid card.    Per CSW team it was requested in weekly meeting that copy of Medicaid card be sent to new [facility].  Guardian reports that she does not have a copy of the card and is waiting on word back from patient's former group home on if they have a card.  She reports that [facility], the [facility] provider, is aware of this as well.  [name], MSW, LCSW 09/07/2023 10:17 AM

## 2023-09-07 NOTE — Group Note (Signed)
 Date:  09/07/2023 Time:  2:17 PM  Group Topic/Focus:  Activity Group:   The focus of this group is to promote activity for the patients and encourage them to go outside to the courtyard for some fresh air and some exercise.    Participation Level:  Active  Participation Quality:  Appropriate  Affect:  Appropriate  Cognitive:  Appropriate  Insight: Appropriate  Engagement in Group:  Engaged  Modes of Intervention:  Activity  Additional Comments:    Crystal Black 09/07/2023, 2:17 PM

## 2023-09-07 NOTE — Group Note (Signed)
 Date:  09/07/2023 Time:  11:07 AM  Group Topic/Focus:  Making Healthy Choices:   The focus of this group is to help patients identify negative/unhealthy choices they were using prior to admission and identify positive/healthier coping strategies to replace them upon discharge.    Participation Level:  Active  Participation Quality:  Appropriate  Affect:  Appropriate  Cognitive:  Appropriate  Insight: Appropriate  Engagement in Group:  Engaged  Modes of Intervention:  Activity  Additional Comments:    Crystal Black 09/07/2023, 11:07 AM

## 2023-09-07 NOTE — Group Note (Signed)
 Date:  09/07/2023 Time:  8:59 PM  Group Topic/Focus:  Wellness Toolbox:   The focus of this group is to discuss various aspects of wellness, balancing those aspects and exploring ways to increase the ability to experience wellness.  Patients will create a wellness toolbox for use upon discharge.    Participation Level:  Active  Participation Quality:  Appropriate  Affect:  Appropriate  Cognitive:  Appropriate  Insight: Appropriate  Engagement in Group:  Engaged  Modes of Intervention:  Education  Additional Comments:  n/a  Lorenda Ishihara 09/07/2023, 8:59 PM
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
## 2023-09-07 NOTE — [facility] Counselor (Signed)
 CSW contacted [employer], they report pt must fill out online referral form.   CSW will look at form and attempt to fill on behalf of pt if appropriate  Reynaldo Minium, MSW, [facility] 09/07/2023 11:53 AM

## 2023-09-08 LAB — GLUCOSE, CAPILLARY
Glucose-Capillary: 128 mg/dL — ABNORMAL HIGH (ref 70–99)
Glucose-Capillary: 87 mg/dL (ref 70–99)

## 2023-09-08 MED ORDER — MELATONIN 5 MG PO TABS: 5.0000 mg | ORAL_TABLET | Freq: Every day | ORAL | 0 refills | Status: AC

## 2023-09-08 NOTE — Plan of Care (Signed)
 Problem: Education: Goal: Knowledge of [facility] Education information/materials will improve Outcome: Adequate for Discharge Goal: Emotional status will improve Outcome: Adequate for Discharge Goal: Mental status will improve Outcome: Adequate for Discharge Goal: Verbalization of understanding the information provided will improve Outcome: Adequate for Discharge   Problem: Activity: Goal: Interest or engagement in activities will improve Outcome: Adequate for Discharge Goal: Sleeping patterns will improve Outcome: Adequate for Discharge   Problem: Coping: Goal: Ability to verbalize frustrations and anger appropriately will improve Outcome: Adequate for Discharge Goal: Ability to demonstrate self-control will improve Outcome: Adequate for Discharge   Problem: Health Behavior/Discharge Planning: Goal: Identification of resources available to assist in meeting health care needs will improve Outcome: Adequate for Discharge Goal: Compliance with treatment plan for underlying cause of condition will improve Outcome: Adequate for Discharge   Problem: Physical Regulation: Goal: Ability to maintain clinical measurements within normal limits will improve Outcome: Adequate for Discharge   Problem: Safety: Goal: Periods of time without injury will increase Outcome: Adequate for Discharge   Problem: Education: Goal: Utilization of techniques to improve thought processes will improve Outcome: Adequate for Discharge Goal: Knowledge of the prescribed therapeutic regimen will improve Outcome: Adequate for Discharge   Problem: Activity: Goal: Interest or engagement in leisure activities will improve Outcome: Adequate for Discharge Goal: Imbalance in normal sleep/wake cycle will improve Outcome: Adequate for Discharge   Problem: Coping: Goal: Coping ability will improve Outcome: Adequate for Discharge Goal: Will verbalize feelings Outcome: Adequate for Discharge    Problem: Health Behavior/Discharge Planning: Goal: Ability to make decisions will improve Outcome: Adequate for Discharge Goal: Compliance with therapeutic regimen will improve Outcome: Adequate for Discharge   Problem: Role Relationship: Goal: Will demonstrate positive changes in social behaviors and relationships Outcome: Adequate for Discharge   Problem: Safety: Goal: Ability to disclose and discuss suicidal ideas will improve Outcome: Adequate for Discharge Goal: Ability to identify and utilize support systems that promote safety will improve Outcome: Adequate for Discharge   Problem: Self-Concept: Goal: Will verbalize positive feelings about self Outcome: Adequate for Discharge Goal: Level of anxiety will decrease Outcome: Adequate for Discharge   Problem: Education: Goal: Knowledge of General Education information will improve Description: Including pain rating scale, medication(s)/side effects and non-pharmacologic comfort measures Outcome: Adequate for Discharge   Problem: Health Behavior/Discharge Planning: Goal: Ability to manage health-related needs will improve Outcome: Adequate for Discharge   Problem: Education: Goal: Ability to describe self-care measures that may prevent or decrease complications (Diabetes Survival Skills Education) will improve Outcome: Adequate for Discharge Goal: Individualized Educational Video(s) Outcome: Adequate for Discharge   Problem: Coping: Goal: Ability to adjust to condition or change in health will improve Outcome: Adequate for Discharge   Problem: Fluid Volume: Goal: Ability to maintain a balanced intake and output will improve Outcome: Adequate for Discharge   Problem: Health Behavior/Discharge Planning: Goal: Ability to identify and utilize available resources and services will improve Outcome: Adequate for Discharge Goal: Ability to manage health-related needs will improve Outcome: Adequate for Discharge    Problem: Metabolic: Goal: Ability to maintain appropriate glucose levels will improve Outcome: Adequate for Discharge   Problem: Nutritional: Goal: Maintenance of adequate nutrition will improve Outcome: Adequate for Discharge Goal: Progress toward achieving an optimal weight will improve Outcome: Adequate for Discharge   Problem: Skin Integrity: Goal: Risk for impaired skin integrity will decrease Outcome: Adequate for Discharge   Problem: Tissue Perfusion: Goal: Adequacy of tissue perfusion will improve Outcome: Adequate for Discharge
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
## 2023-09-08 NOTE — Progress Notes (Signed)
  [facility] Adult Case Management Discharge Plan :  Will you be returning to the same living situation after discharge:  No.Patient to discharge to a family care home in [place], [place].  At discharge, do you have transportation home?: Yes,  CSW to arrange taxi services on patient's behalf to ensure safe discharge.  Do you have the ability to pay for your medications: Yes, [employer] / [employer]   Release of information consent forms completed and in the chart;  Patient's signature needed at discharge.  Patient to Follow up at:  Follow-up Information     [facility]. Go on 11/24/2023.   Why: Your appointment is scheduled for 2:10 PM with Dolphus Jenny, NP Contact information: [address] [place], [place] [address]  Office: [phone]  Fax: [phone]                Next level of care provider has access to [facility] Link:no  Safety Planning and Suicide Prevention discussed: Yes, Education Completed; guardian, [name], [phone] ,  (name of family member/significant other) has been identified by the patient as the family member/significant other with whom the patient will be residing, and identified as the person(s) who will aid the patient in the event of a mental health crisis (suicidal ideations/suicide attempt).  With written consent from the patient, the family member/significant other has been provided the following suicide prevention education, prior to the and/or following the discharge of the patient.      Has patient been referred to the Quitline?: Patient refused referral for treatment  Patient has been referred for addiction treatment: No known substance use disorder.   Lowry Ram, LCSW 09/08/2023, 10:00 AM

## 2023-09-08 NOTE — Progress Notes (Signed)
 Patient ID: Crystal Black, female   DOB: 1996/01/24, 28 y.o.   MRN: [id number]  Discharge Note:  Patient denies SI/HI/AVH at this time. Discharge instructions, AVS, prescriptions, medication supply, and transition record gone over with patient. Patient given a copy of her Suicide Safety Plan. Patient agrees to comply with medication management, follow-up visit, and outpatient therapy. Patient belongings returned to patient. Patient questions and concerns addressed and answered. Patient ambulatory off unit. Patient discharged to a new family care home via [facility] Taxicab services.

## 2023-09-08 NOTE — Progress Notes (Signed)
   09/08/23 1200  Psych Admission Type (Psych Patients Only)  Admission Status Voluntary  Psychosocial Assessment  Patient Complaints None  Eye Contact Fair  Facial Expression Anxious  Affect Appropriate to circumstance  Speech Logical/coherent  Interaction Assertive  Motor Activity Slow  Appearance/Hygiene Unremarkable  Behavior Characteristics Cooperative;Appropriate to situation;Resistant to care (resistant to take laxative and stool softener, being that she will be transporting to new facility via taxicab.)  Mood Anxious;Pleasant (patient is ready to discharge and get on with her everyday life.)  Aggressive Behavior  Effect No apparent injury  Thought Process  Coherency WDL  Content WDL  Delusions None reported or observed  Perception WDL  Hallucination None reported or observed  Judgment WDL  Confusion None  Danger to Self  Current suicidal ideation? Denies  Self-Injurious Behavior No self-injurious ideation or behavior indicators observed or expressed   Agreement Not to Harm Self Yes  Description of Agreement Verbal  Danger to Others  Danger to Others None reported or observed  Danger to Others Abnormal  Harmful Behavior to others No threats or harm toward other people  Destructive Behavior No threats or harm toward property
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
## 2023-09-08 NOTE — Group Note (Signed)
 Date:  09/08/2023 Time:  10:21 AM  Group Topic/Focus:  Goals Group:   The focus of this group is to help patients establish daily goals to achieve during treatment and discuss how the patient can incorporate goal setting into their daily lives to aide in recovery.    Participation Level:    Participation Quality:    Affect:    Cognitive:    Insight:   Engagement in Group:    Modes of Intervention:    Additional Comments:    Ophelia Shoulder 09/08/2023, 10:21 AM

## 2023-09-09 NOTE — Progress Notes (Signed)
 This Clinical research associate has been on the phone with [place] Pink, family care home staff member that accepted patient on yesterday. She is inquiring about some of patient's medications that weren't sent over to her preferred pharmacy, along with needing to know the instructions for her sliding scale. This Clinical research associate and [employer], were able to help out [employer] and the Pharmacist at [employer] That is why this writer had to go back into patient's chart.

## 2023-10-31 ENCOUNTER — Ambulatory Visit: Payer: MEDICAID | Admitting: Infectious Diseases

## 2024-01-17 ENCOUNTER — Other Ambulatory Visit: Payer: Self-pay

## 2024-06-05 ENCOUNTER — Emergency Department
Admission: EM | Admit: 2024-06-05 | Discharge: 2024-06-05 | Disposition: A | Discharge status: Home / Self Care | Payer: MEDICAID | Attending: Emergency Medicine | Admitting: Emergency Medicine

## 2024-06-05 DIAGNOSIS — J069 Acute upper respiratory infection, unspecified: Secondary | ICD-10-CM | POA: Insufficient documentation

## 2024-06-05 DIAGNOSIS — N920 Excessive and frequent menstruation with regular cycle: Secondary | ICD-10-CM | POA: Diagnosis not present

## 2024-06-05 DIAGNOSIS — R059 Cough, unspecified: Secondary | ICD-10-CM | POA: Diagnosis present

## 2024-06-05 LAB — CBC WITH DIFFERENTIAL/PLATELET
Abs Immature Granulocytes: 0.02 K/uL (ref 0.00–0.07)
Basophils Absolute: 0.1 K/uL (ref 0.0–0.1)
Basophils Relative: 1 %
Eosinophils Absolute: 0 K/uL (ref 0.0–0.5)
Eosinophils Relative: 0 %
HCT: 42.1 % (ref 36.0–46.0)
Hemoglobin: 13.7 g/dL (ref 12.0–15.0)
Immature Granulocytes: 0 %
Lymphocytes Relative: 30 %
Lymphs Abs: 2.7 K/uL (ref 0.7–4.0)
MCH: 30.4 pg (ref 26.0–34.0)
MCHC: 32.5 g/dL (ref 30.0–36.0)
MCV: 93.3 fL (ref 80.0–100.0)
Monocytes Absolute: 0.5 K/uL (ref 0.1–1.0)
Monocytes Relative: 6 %
Neutro Abs: 5.6 K/uL (ref 1.7–7.7)
Neutrophils Relative %: 63 %
Platelets: 236 K/uL (ref 150–400)
RBC: 4.51 MIL/uL (ref 3.87–5.11)
RDW: 12.7 % (ref 11.5–15.5)
WBC: 8.9 K/uL (ref 4.0–10.5)
nRBC: 0 % (ref 0.0–0.2)

## 2024-06-05 LAB — URINALYSIS, ROUTINE W REFLEX MICROSCOPIC
Bilirubin Urine: NEGATIVE
Glucose, UA: NEGATIVE mg/dL
Ketones, ur: 5 mg/dL — AB
Leukocytes,Ua: NEGATIVE
Nitrite: NEGATIVE
Protein, ur: NEGATIVE mg/dL
Specific Gravity, Urine: 1.026 (ref 1.005–1.030)
pH: 5 (ref 5.0–8.0)

## 2024-06-05 LAB — POC URINE PREG, ED: Preg Test, Ur: NEGATIVE

## 2024-06-05 LAB — RESP PANEL BY RT-PCR (RSV, FLU A&B, COVID)  RVPGX2
Influenza A by PCR: NEGATIVE
Influenza B by PCR: NEGATIVE
Resp Syncytial Virus by PCR: NEGATIVE
SARS Coronavirus 2 by RT PCR: NEGATIVE

## 2024-06-05 LAB — GROUP A STREP BY PCR: Group A Strep by PCR: NOT DETECTED

## 2024-06-05 NOTE — ED Notes (Signed)
 Pink purple and green top sent to lab

## 2024-06-05 NOTE — ED Triage Notes (Signed)
 Pt presents to the ED via POV from [facility]. Pt reports vaginal bleeding that started today as well as coughing up blood. Pt states that she is pregnant, but doesn't know how far along she is and states that she doesn't remember her last period. Pt has a legal guardian. Accompanied by group home caregiver

## 2024-06-05 NOTE — Discharge Instructions (Addendum)
 Your exam and labs are normal and reassuring.  Your urine and blood pregnancy test are negative at this time.  This means you are likely experiencing a regular menstrual period.  If you had a positive urine pregnancy test in the last 2 months, you may have experienced a spontaneous miscarriage.  Your negative blood pregnancy test, confirms that you have no evidence of retained products of pregnancy within your uterus.  You should follow-up with your primary provider for ongoing evaluation.  Use safer sex practices including a condom if you do not intend to become pregnant at this time.

## 2024-06-06 ENCOUNTER — Encounter: Payer: Self-pay | Admitting: Emergency Medicine

## 2024-06-06 ENCOUNTER — Other Ambulatory Visit: Payer: Self-pay

## 2024-06-06 DIAGNOSIS — X810XXA Intentional self-harm by jumping or lying in front of motor vehicle, initial encounter: Secondary | ICD-10-CM | POA: Insufficient documentation

## 2024-06-06 DIAGNOSIS — Z8659 Personal history of other mental and behavioral disorders: Secondary | ICD-10-CM | POA: Diagnosis not present

## 2024-06-06 DIAGNOSIS — R45851 Suicidal ideations: Secondary | ICD-10-CM | POA: Diagnosis not present

## 2024-06-06 DIAGNOSIS — J45909 Unspecified asthma, uncomplicated: Secondary | ICD-10-CM | POA: Insufficient documentation

## 2024-06-06 DIAGNOSIS — Z21 Asymptomatic human immunodeficiency virus [HIV] infection status: Secondary | ICD-10-CM | POA: Insufficient documentation

## 2024-06-06 DIAGNOSIS — F3160 Bipolar disorder, current episode mixed, unspecified: Secondary | ICD-10-CM | POA: Diagnosis not present

## 2024-06-06 DIAGNOSIS — S50811A Abrasion of right forearm, initial encounter: Secondary | ICD-10-CM | POA: Insufficient documentation

## 2024-06-06 DIAGNOSIS — R45850 Homicidal ideations: Secondary | ICD-10-CM | POA: Diagnosis not present

## 2024-06-06 DIAGNOSIS — F603 Borderline personality disorder: Secondary | ICD-10-CM | POA: Insufficient documentation

## 2024-06-06 DIAGNOSIS — S59911A Unspecified injury of right forearm, initial encounter: Secondary | ICD-10-CM | POA: Diagnosis present

## 2024-06-06 DIAGNOSIS — T1491XA Suicide attempt, initial encounter: Secondary | ICD-10-CM | POA: Diagnosis not present

## 2024-06-06 DIAGNOSIS — F3164 Bipolar disorder, current episode mixed, severe, with psychotic features: Secondary | ICD-10-CM | POA: Insufficient documentation

## 2024-06-06 LAB — CBC
HCT: 41.9 % (ref 36.0–46.0)
Hemoglobin: 13.9 g/dL (ref 12.0–15.0)
MCH: 30.8 pg (ref 26.0–34.0)
MCHC: 33.2 g/dL (ref 30.0–36.0)
MCV: 92.9 fL (ref 80.0–100.0)
Platelets: 235 K/uL (ref 150–400)
RBC: 4.51 MIL/uL (ref 3.87–5.11)
RDW: 12.8 % (ref 11.5–15.5)
WBC: 8.6 K/uL (ref 4.0–10.5)
nRBC: 0 % (ref 0.0–0.2)

## 2024-06-06 LAB — URINE DRUG SCREEN
Amphetamines: NEGATIVE
Barbiturates: NEGATIVE
Benzodiazepines: NEGATIVE
Cocaine: NEGATIVE
Fentanyl: NEGATIVE
Methadone Scn, Ur: NEGATIVE
Opiates: NEGATIVE
Tetrahydrocannabinol: NEGATIVE

## 2024-06-06 LAB — COMPREHENSIVE METABOLIC PANEL WITH GFR
ALT: 78 U/L — ABNORMAL HIGH (ref 0–44)
AST: 57 U/L — ABNORMAL HIGH (ref 15–41)
Albumin: 4.8 g/dL (ref 3.5–5.0)
Alkaline Phosphatase: 66 U/L (ref 38–126)
Anion gap: 13 (ref 5–15)
BUN: 22 mg/dL — ABNORMAL HIGH (ref 6–20)
CO2: 25 mmol/L (ref 22–32)
Calcium: 9.5 mg/dL (ref 8.9–10.3)
Chloride: 102 mmol/L (ref 98–111)
Creatinine, Ser: 0.95 mg/dL (ref 0.44–1.00)
GFR, Estimated: >60 mL/min (ref >60)
Glucose, Bld: 115 mg/dL — ABNORMAL HIGH (ref 70–99)
Potassium: 3.8 mmol/L (ref 3.5–5.1)
Sodium: 140 mmol/L (ref 135–145)
Total Bilirubin: 0.4 mg/dL (ref 0.0–1.2)
Total Protein: 8.3 g/dL — ABNORMAL HIGH (ref 6.5–8.1)

## 2024-06-06 LAB — POC URINE PREG, ED: Preg Test, Ur: NEGATIVE

## 2024-06-06 LAB — ETHANOL: Alcohol, Ethyl (B): <15 mg/dL (ref <15)

## 2024-06-06 NOTE — ED Notes (Signed)
 Pt changed into safety scrubs in presence of this RN and ariel, NT; no obvious signs of trauma noted. Pts belongings collected which included:  Jacket Vape Underwear Bra Shirt Pants Socks Shoes  necklace

## 2024-06-06 NOTE — ED Triage Notes (Signed)
 Pt arrives in [place] police custody w. IVC paperwork that states pt was seen via telehealth today and reports SI w/ intention to step out in front of traffic due to recent miscarriage.  Pt reports attempting to cut her wrists today. Superficial abrasions noted to right FA. Client has little insight into why this is dangerous and continues to states she will attempt again. Client continues to be a danger to herself.

## 2024-06-07 ENCOUNTER — Emergency Department
Admission: EM | Admit: 2024-06-07 | Discharge: 2024-06-09 | Disposition: A | Payer: MEDICAID | Attending: Emergency Medicine | Admitting: Emergency Medicine

## 2024-06-07 ENCOUNTER — Encounter: Payer: Self-pay | Admitting: Psychiatry

## 2024-06-07 ENCOUNTER — Inpatient Hospital Stay: Admission: RE | Admit: 2024-06-07 | Payer: MEDICAID | Source: Intra-hospital | Admitting: Psychiatry

## 2024-06-07 DIAGNOSIS — R45850 Homicidal ideations: Secondary | ICD-10-CM | POA: Insufficient documentation

## 2024-06-07 DIAGNOSIS — T1491XA Suicide attempt, initial encounter: Secondary | ICD-10-CM | POA: Diagnosis not present

## 2024-06-07 DIAGNOSIS — F3160 Bipolar disorder, current episode mixed, unspecified: Secondary | ICD-10-CM

## 2024-06-07 DIAGNOSIS — F603 Borderline personality disorder: Secondary | ICD-10-CM

## 2024-06-07 DIAGNOSIS — Z7289 Other problems related to lifestyle: Secondary | ICD-10-CM

## 2024-06-07 DIAGNOSIS — F22 Delusional disorders: Secondary | ICD-10-CM

## 2024-06-07 DIAGNOSIS — Z8659 Personal history of other mental and behavioral disorders: Secondary | ICD-10-CM

## 2024-06-07 DIAGNOSIS — R45851 Suicidal ideations: Secondary | ICD-10-CM | POA: Insufficient documentation

## 2024-06-07 LAB — VALPROIC ACID LEVEL: Valproic Acid Lvl: 38 ug/mL — ABNORMAL LOW (ref 50–100)

## 2024-06-07 MED ADMIN — Nicotine TD Patch 24HR 7 MG/24HR: 7 mg | TRANSDERMAL | NDC 43598044670

## 2024-06-07 MED FILL — Nicotine TD Patch 24HR 7 MG/24HR: 7.0000 mg | TRANSDERMAL | Qty: 1 | Status: AC

## 2024-06-07 NOTE — ED Provider Notes (Signed)
° °  [facility] Provider Note    Event Date/Time   First MD Initiated Contact with Patient 06/07/24 0016     (approximate)  History   Chief Complaint: Psychiatric Evaluation  HPI  Crystal Black is a 28 y.o. female with a past medical history of ADHD, anxiety, HIV, PTSD, presents to the emergency department complaining of suicidal ideation.  According to the patient she states she recently went through a miscarriage.  Has been experiencing worsening depression and having thoughts of running into traffic to kill herself.  She also has made some superficial scrapes to her right forearm.  Physical Exam   Triage Vital Signs: ED Triage Vitals  Encounter Vitals Group     BP 06/06/24 2203 123/88     Girls Systolic BP Percentile --      Girls Diastolic BP Percentile --      Boys Systolic BP Percentile --      Boys Diastolic BP Percentile --      Pulse Rate 06/06/24 2203 (!) 101     Resp 06/06/24 2203 18     Temp 06/06/24 2203 97.7 F (36.5 C)     Temp Source 06/06/24 2203 Oral     SpO2 06/06/24 2203 98 %     Weight --      Height --      Head Circumference --      Peak Flow --      Pain Score 06/06/24 2216 4     Pain Loc --      Pain Education --      Exclude from Growth Chart --     Most recent vital signs: Vitals:   06/06/24 2203  BP: 123/88  Pulse: (!) 101  Resp: 18  Temp: 97.7 F (36.5 C)  SpO2: 98%    General: Awake, no distress.  CV:  Good peripheral perfusion.  Regular rate and rhythm  Resp:  Normal effort.  Equal breath sounds bilaterally.  Abd:  No distention.  Soft, nontender.  No rebound or guarding. Other:  Very superficial abrasion to right forearm, no laceration.   ED Results / Procedures / Treatments   MEDICATIONS ORDERED IN ED: Medications - No data to display   IMPRESSION / MDM / ASSESSMENT AND PLAN / ED COURSE  I reviewed the triage vital signs and the nursing notes.  Patient's presentation is most consistent with  acute presentation with potential threat to life or bodily function.  Patient presents emergency department for worsening depression and suicidal ideation.  Overall reassuring physical exam reassuring vitals, reassuring labs including a normal CBC chemistry negative ethanol negative urine drug screen negative pregnancy test.  Will maintain the IVC until psychiatry and TTS can adequately evaluate.  Patient medically cleared for psychiatric disposition.  FINAL CLINICAL IMPRESSION(S) / ED DIAGNOSES   Suicidal ideation   Note:  This document was prepared using Dragon voice recognition software and may include unintentional dictation errors.   Dorothyann Drivers, MD 06/07/24 [phone]

## 2024-06-07 NOTE — ED Notes (Signed)
 Meal provided

## 2024-06-07 NOTE — ED Notes (Signed)
 Patient sleeping
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
## 2024-06-07 NOTE — ED Notes (Signed)

## 2024-06-07 NOTE — ED Notes (Addendum)
 Pt was given a snack.

## 2024-06-07 NOTE — ED Notes (Signed)
 IVC  GOING TO  [place] [facility] ON 06/08/24

## 2024-06-07 NOTE — ED Provider Notes (Signed)
 Emergency Medicine Observation Re-evaluation Note  Crystal Black is a 28 y.o. female, seen on rounds today.  Pt initially presented to the ED for complaints of Psychiatric Evaluation Currently, the patient is resting.  Physical Exam  BP 123/88 (BP Location: Left Arm)   Pulse (!) 101   Temp 97.7 F (36.5 C) (Oral)   Resp 18   LMP  (LMP Unknown)   SpO2 98%  Physical Exam .Gen:  No acute distress Resp:  Breathing easily and comfortably, no accessory muscle usage Neuro:  Moving all four extremities, no gross focal neuro deficits Psych:  Resting currently, calm when awake   ED Course / MDM  EKG:   I have reviewed the labs performed to date as well as medications administered while in observation.  Recent changes in the last 24 hours include no acute events.  Plan  Current plan is for pending psych dispo.    Waymond Lorelle Cummins, MD 06/07/24 [phone]

## 2024-06-07 NOTE — Progress Notes (Deleted)
 Patient has been accepted to [facility] 06/07/24. Patient assigned to room 304 Accepting physician is Dr. Jadapalle. Call report to [phone]. Representative was [facility] AC Linsey.   ER Staff is aware of it: [facility] ER Secretary Dr. Waymond, ER MD Delon, RN Patient's Nurse
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
## 2024-06-07 NOTE — ED Notes (Signed)
 Pt went to the restroom and also got assistance with her tv in her room

## 2024-06-07 NOTE — ED Notes (Signed)
 Pt given dinner tray.

## 2024-06-07 NOTE — ED Notes (Signed)
Patient in day room. 

## 2024-06-07 NOTE — ED Notes (Signed)
 Pt given snack.

## 2024-06-07 NOTE — Progress Notes (Signed)
 [facility] received call from Cat @ [facility] requesting for patient to bring specific meds that they are unable to provide.  [facility] spoke with Group Home owner Gaines Budge [phone]).  [name] will be sending her staff to drop off 1 week supply of meds to be transported with pt to [facility] tomorrow. Delon RN was updated.    [place], [facility] [phone]

## 2024-06-07 NOTE — Progress Notes (Addendum)
 Patient has been accepted to [facility] for 06/08/24. Patient was assigned to [address]. Accepting physician is Dr. Daniella Gavito. Call report to [phone]. Representative was Cat.   ER Staff is aware of it: Olam, ER Secretary Dr. Waymond, ER MD Delon PEAK, Patient's Nurse  Address:  [address]                 [place], [place] [address]

## 2024-06-07 NOTE — ED Notes (Signed)
 ivc prior to arrival/psych consult ordered/pending.Marland KitchenMarland Kitchen

## 2024-06-08 LAB — CBG MONITORING, ED: Glucose-Capillary: 93 mg/dL (ref 70–99)

## 2024-06-08 MED ORDER — NICOTINE POLACRILEX 2 MG MT GUM
2.0000 mg | CHEWING_GUM | OROMUCOSAL | Status: DC | PRN
  Administered 2024-06-08 – 2024-06-09 (×4): 2 mg via ORAL
  Filled 2024-06-08 (×4): qty 1

## 2024-06-08 MED ADMIN — Nicotine TD Patch 24HR 7 MG/24HR: 7 mg | TRANSDERMAL | NDC 43598044670

## 2024-06-08 NOTE — ED Notes (Addendum)
lunch tray provided to pt.

## 2024-06-08 NOTE — ED Notes (Signed)
 IVC/ Going to [facility] on 06/08/24

## 2024-06-08 NOTE — ED Provider Notes (Signed)
. °  °  Dicky Anes, MD 06/09/24 1249

## 2024-06-08 NOTE — ED Notes (Signed)
 Dinner tray provided to pt

## 2024-06-08 NOTE — ED Provider Notes (Signed)
 Advised patient needs home HIV medication. RN to review, advises group home has information. Did not receive yet. Will place pharmacy order/consult to review and advise on appropraite home meds.    Dicky Anes, MD 06/08/24 2043

## 2024-06-08 NOTE — ED Notes (Signed)
Patient given snack.  

## 2024-06-08 NOTE — ED Notes (Signed)
 Therapist [place] notified of patient request.

## 2024-06-08 NOTE — ED Notes (Signed)
 Pt. Called this writer to the door and said she wanted to use the phone, this was denied because the patient has already made 3 phone calls today during each time allotment, patient became angry and started cursing at this writer calling this clinical research associate a stupid bitch and saying I will kill you you dumb bitch, this writer shut the door and informed patient when she stops screaming and cursing the conversation can continue

## 2024-06-08 NOTE — ED Notes (Signed)
 Patient reporting AH noncommanding, stating that voices are demeaning and telling her to harm herself. Also reports seeing shadow people. Encouraged patient to keep reporting any changes that she is experiencing and to distract herself with positive self talk, watching tv, playing cards, reading a book, or continue to socialize in the dayroom. The patient is tearful about not being on HIV medications, message send to provider already about restarting the medication. Patient reports has not been on medication in two days. Requesting to speak to a therapist about feelings.

## 2024-06-08 NOTE — ED Provider Notes (Signed)
 [facility] Emergency Department Provider Note     Event Date/Time   First MD Initiated Contact with Patient 06/05/24 1821     (approximate)   History   Vaginal Bleeding   HPI  Crystal Black is a 28 y.o. female with a history of PTSD, ADHD, anxiety, depression, asthma, and HIV, presents to the ED from a group home.  Patient is attended by her group home personnel.  She presents today noting that she thought she was pregnant, and has been experiencing some menstrual-like bleeding.  Patient also reports some bloody sputum with some upper respiratory symptoms.  Patient reports she is not sure how far along she is, she did however, remark that her last menstrual period was approximately 2 months ago.  She would report that she had a pregnancy confirmed at a local clinic with a positive urine pregnancy test.  She presents to the ED for evaluation of vaginal bleeding as well as URI symptoms.  No fevers, chills, sweats, chest pain, shortness of breath reported.  Physical Exam   Triage Vital Signs: ED Triage Vitals  Encounter Vitals Group     BP 06/05/24 1800 (!) 132/96     Girls Systolic BP Percentile --      Girls Diastolic BP Percentile --      Boys Systolic BP Percentile --      Boys Diastolic BP Percentile --      Pulse Rate 06/05/24 1800 (!) 117     Resp 06/05/24 1800 18     Temp 06/05/24 1800 98 F (36.7 C)     Temp Source 06/05/24 1800 Oral     SpO2 06/05/24 1800 97 %     Weight 06/05/24 1801 200 lb (90.7 kg)     Height 06/05/24 1801 5' 3 (1.6 m)     Head Circumference --      Peak Flow --      Pain Score 06/05/24 1800 7     Pain Loc --      Pain Education --      Exclude from Growth Chart --     Most recent vital signs: Vitals:   06/05/24 1800  BP: (!) 132/96  Pulse: (!) 117  Resp: 18  Temp: 98 F (36.7 C)  SpO2: 97%    General Awake, no distress. NAD HEENT NCAT. PERRL. EOMI. No rhinorrhea. Mucous membranes are moist.  CV:  Good  peripheral perfusion. RRR RESP:  Normal effort. CTA ABD:  No distention.  GYN:  deferred   ED Results / Procedures / Treatments   Labs (all labs ordered are listed, but only abnormal results are displayed) Labs Reviewed  URINALYSIS, ROUTINE W REFLEX MICROSCOPIC - Abnormal; Notable for the following components:      Result Value   Color, Urine YELLOW (*)    APPearance HAZY (*)    Hgb urine dipstick MODERATE (*)    Ketones, ur 5 (*)    Bacteria, UA FEW (*)    All other components within normal limits  RESP PANEL BY RT-PCR (RSV, FLU A&B, COVID)  [id number]  GROUP A STREP BY PCR  CBC WITH DIFFERENTIAL/PLATELET  POC URINE PREG, ED     EKG   RADIOLOGY  No results found.   PROCEDURES:  Critical Care performed: No  Procedures   MEDICATIONS ORDERED IN ED: Medications - No data to display   IMPRESSION / MDM / ASSESSMENT AND PLAN / ED COURSE  I reviewed the triage vital  signs and the nursing notes.                              Differential diagnosis includes, but is not limited to, spontaneous miscarriage, abnormal vaginal bleeding, regular menstrual bleeding leading, UTI   Patient's presentation is most consistent with acute complicated illness / injury requiring diagnostic workup.  Patient's diagnosis is consistent with viral URI with cough.  Patient with a negative viral panel test and negative strep PCR.  UA was also negative for pregnancy at this time.  No signs of an acute UTI.  Patient is stable on presentation, with no signs of acute respiratory distress, dehydration, or toxic appearance.  Patient symptoms likely represent regular menstrual bleeding with reports of a recently positive UPT.  I am unable to verify that report on review of the patient's chart.  However patient is at this time experiencing menstrual bleeding that likely represents either a recently miscarried pregnancy, or routine menstrual bleeding. Patient will be discharged home with instructions to  take her home meds as prescribed. Patient is to follow up with her PCP as needed or otherwise directed. Patient is given ED precautions to return to the ED for any worsening or new symptoms.     FINAL CLINICAL IMPRESSION(S) / ED DIAGNOSES   Final diagnoses:  Viral URI with cough  Menorrhagia with regular cycle     Rx / DC Orders   ED Discharge Orders     None        Note:  This document was prepared using Dragon voice recognition software and may include unintentional dictation errors.    Loyd Candida LULLA Aldona, [name] 06/08/24 1512

## 2024-06-08 NOTE — ED Notes (Addendum)
 Crystal Black

## 2024-06-08 NOTE — ED Notes (Signed)
 No available female conservator, museum/gallery at [employer] dept to transport patient to [facility] today. Will attempt to transport tomorrow 06/09/24.

## 2024-06-08 NOTE — ED Notes (Addendum)
 Attempted to call report to [facility] however supervisor Mima unable to take report, stated will call me back.

## 2024-06-08 NOTE — ED Provider Notes (Signed)
 Emergency Medicine Observation Re-evaluation Note  Crystal Black is a 28 y.o. female, seen on rounds today.  Pt initially presented to the ED for complaints of Psychiatric Evaluation Currently, the patient is resting.  Physical Exam   Vitals:   06/07/24 1536 06/07/24 2248  BP: 138/82 (!) 93/54  Pulse: 93 80  Resp: 18 19  Temp: 98.9 F (37.2 C) 98.3 F (36.8 C)  SpO2: 99% 97%     Physical Exam Currently sleeping without distress.  Nursing reports no concerns occurring overnight  Even unlabored respirations resting in bed   ED Course / MDM    I have reviewed the labs performed to date as well as medications administered while in observation.  Recent changes in the last 24 hours include no acute events.  Plan  Current plan is for pending psych dispo.   Dicky Anes, MD 06/08/24 [phone]

## 2024-06-08 NOTE — ED Notes (Signed)
 Pt. Apologized to this clinical research associate and stated she understands this RN is just following the rules, she stated the other nurse let her use the phone multiple times and she did not realize it was only 3 calls per day, this writer reiterated the rules to patient

## 2024-06-09 MED ORDER — DOLUTEGRAVIR SODIUM 50 MG PO TABS: 50.0000 mg | ORAL_TABLET | Freq: Every day | ORAL | Status: DC

## 2024-06-09 MED ORDER — DARUNAVIR-COBICISTAT 800-150 MG PO TABS
1.0000 | ORAL_TABLET | Freq: Every day | ORAL | Status: DC
  Administered 2024-06-09: 1 via ORAL
  Filled 2024-06-09: qty 1

## 2024-06-09 NOTE — ED Notes (Signed)
EMTALA Reviewed by this RN at this time.

## 2024-06-09 NOTE — ED Notes (Signed)
 IVC/Awaiting transport to [facility] If female transporter is available

## 2024-06-09 NOTE — ED Notes (Signed)
 Breakfast tray and milk given to patient.

## 2024-06-09 NOTE — ED Notes (Signed)
 Ate 100% of breakfast tray and drank milk

## 2024-06-17 DIAGNOSIS — J45909 Unspecified asthma, uncomplicated: Secondary | ICD-10-CM | POA: Insufficient documentation

## 2024-06-17 DIAGNOSIS — R7401 Elevation of levels of liver transaminase levels: Secondary | ICD-10-CM | POA: Diagnosis not present

## 2024-06-17 DIAGNOSIS — F603 Borderline personality disorder: Secondary | ICD-10-CM | POA: Diagnosis present

## 2024-06-17 DIAGNOSIS — Z21 Asymptomatic human immunodeficiency virus [HIV] infection status: Secondary | ICD-10-CM | POA: Diagnosis not present

## 2024-06-17 DIAGNOSIS — Z591 Inadequate housing, unspecified: Secondary | ICD-10-CM | POA: Insufficient documentation

## 2024-06-17 DIAGNOSIS — Z7951 Long term (current) use of inhaled steroids: Secondary | ICD-10-CM | POA: Insufficient documentation

## 2024-06-17 DIAGNOSIS — Z7984 Long term (current) use of oral hypoglycemic drugs: Secondary | ICD-10-CM | POA: Insufficient documentation

## 2024-06-17 DIAGNOSIS — Z79899 Other long term (current) drug therapy: Secondary | ICD-10-CM | POA: Diagnosis not present

## 2024-06-18 ENCOUNTER — Other Ambulatory Visit: Payer: Self-pay

## 2024-06-18 ENCOUNTER — Emergency Department
Admission: EM | Admit: 2024-06-18 | Discharge: 2024-06-18 | Disposition: A | Discharge status: Home / Self Care | Payer: MEDICAID | Attending: Emergency Medicine | Admitting: Emergency Medicine

## 2024-06-18 DIAGNOSIS — F603 Borderline personality disorder: Secondary | ICD-10-CM | POA: Diagnosis not present

## 2024-06-18 DIAGNOSIS — R45851 Suicidal ideations: Secondary | ICD-10-CM

## 2024-06-18 DIAGNOSIS — R4589 Other symptoms and signs involving emotional state: Secondary | ICD-10-CM

## 2024-06-18 DIAGNOSIS — Z7289 Other problems related to lifestyle: Secondary | ICD-10-CM

## 2024-06-18 LAB — SALICYLATE LEVEL: Salicylate Lvl: <7 mg/dL — ABNORMAL LOW (ref 7.0–30.0)

## 2024-06-18 LAB — ACETAMINOPHEN LEVEL: Acetaminophen (Tylenol), Serum: <10 ug/mL — ABNORMAL LOW (ref 10–30)

## 2024-06-18 LAB — URINE DRUG SCREEN
Amphetamines: NEGATIVE
Barbiturates: NEGATIVE
Benzodiazepines: POSITIVE — AB
Cocaine: NEGATIVE
Fentanyl: NEGATIVE
Methadone Scn, Ur: NEGATIVE
Opiates: NEGATIVE
Tetrahydrocannabinol: NEGATIVE

## 2024-06-18 LAB — COMPREHENSIVE METABOLIC PANEL WITH GFR
ALT: 75 U/L — ABNORMAL HIGH (ref 0–44)
AST: 44 U/L — ABNORMAL HIGH (ref 15–41)
Albumin: 4.6 g/dL (ref 3.5–5.0)
Alkaline Phosphatase: 78 U/L (ref 38–126)
Anion gap: 12 (ref 5–15)
BUN: 17 mg/dL (ref 6–20)
CO2: 26 mmol/L (ref 22–32)
Calcium: 9.5 mg/dL (ref 8.9–10.3)
Chloride: 105 mmol/L (ref 98–111)
Creatinine, Ser: 0.75 mg/dL (ref 0.44–1.00)
GFR, Estimated: >60 mL/min
Glucose, Bld: 87 mg/dL (ref 70–99)
Potassium: 3.9 mmol/L (ref 3.5–5.1)
Sodium: 143 mmol/L (ref 135–145)
Total Bilirubin: 0.2 mg/dL (ref 0.0–1.2)
Total Protein: 7.7 g/dL (ref 6.5–8.1)

## 2024-06-18 LAB — CBC
HCT: 37.9 % (ref 36.0–46.0)
Hemoglobin: 12.6 g/dL (ref 12.0–15.0)
MCH: 31 pg (ref 26.0–34.0)
MCHC: 33.2 g/dL (ref 30.0–36.0)
MCV: 93.3 fL (ref 80.0–100.0)
Platelets: 254 K/uL (ref 150–400)
RBC: 4.06 MIL/uL (ref 3.87–5.11)
RDW: 13.1 % (ref 11.5–15.5)
WBC: 6.8 K/uL (ref 4.0–10.5)
nRBC: 0 % (ref 0.0–0.2)

## 2024-06-18 LAB — ETHANOL: Alcohol, Ethyl (B): <15 mg/dL

## 2024-06-18 LAB — VALPROIC ACID LEVEL: Valproic Acid Lvl: 42 ug/mL — ABNORMAL LOW (ref 50–100)

## 2024-06-18 LAB — POC URINE PREG, ED: Preg Test, Ur: NEGATIVE

## 2024-06-18 MED ORDER — LINACLOTIDE 145 MCG PO CAPS
290.0000 ug | ORAL_CAPSULE | Freq: Every day | ORAL | Status: DC
  Administered 2024-06-18: 290 ug via ORAL
  Filled 2024-06-18 (×3): qty 2

## 2024-06-18 MED ORDER — FLUOXETINE HCL 20 MG PO TABS
30.0000 mg | ORAL_TABLET | Freq: Every day | ORAL | Status: DC
  Filled 2024-06-18: qty 2

## 2024-06-18 MED ORDER — NICOTINE POLACRILEX 2 MG MT GUM
2.0000 mg | CHEWING_GUM | OROMUCOSAL | Status: DC | PRN
  Administered 2024-06-18: 2 mg via ORAL
  Filled 2024-06-18: qty 1

## 2024-06-18 MED ORDER — ALBUTEROL SULFATE HFA 108 (90 BASE) MCG/ACT IN AERS
2.0000 | INHALATION_SPRAY | RESPIRATORY_TRACT | Status: DC | PRN
  Filled 2024-06-18: qty 6.7

## 2024-06-18 MED ORDER — HYDROXYZINE HCL 25 MG PO TABS: 25.0000 mg | ORAL_TABLET | Freq: Every day | ORAL | Status: DC | PRN

## 2024-06-18 MED ORDER — GUANFACINE HCL 1 MG PO TABS: 1.0000 mg | ORAL_TABLET | Freq: Every day | ORAL | Status: DC

## 2024-06-18 MED ORDER — FLUOXETINE HCL 20 MG PO CAPS
30.0000 mg | ORAL_CAPSULE | Freq: Every day | ORAL | Status: DC
  Administered 2024-06-18: 30 mg via ORAL
  Filled 2024-06-18 (×2): qty 1

## 2024-06-18 MED ORDER — MONTELUKAST SODIUM 10 MG PO TABS: 5.0000 mg | ORAL_TABLET | Freq: Every day | ORAL | Status: DC

## 2024-06-18 MED ORDER — DOLUTEGRAVIR SODIUM 50 MG PO TABS
50.0000 mg | ORAL_TABLET | Freq: Every day | ORAL | Status: DC
  Administered 2024-06-18: 50 mg via ORAL
  Filled 2024-06-18 (×2): qty 1

## 2024-06-18 MED ORDER — DIVALPROEX SODIUM ER 500 MG PO TB24
500.0000 mg | ORAL_TABLET | Freq: Two times a day (BID) | ORAL | Status: DC
  Administered 2024-06-18: 500 mg via ORAL
  Filled 2024-06-18: qty 1

## 2024-06-18 MED ORDER — FAMOTIDINE 20 MG PO TABS
20.0000 mg | ORAL_TABLET | Freq: Every day | ORAL | Status: DC
  Administered 2024-06-18: 20 mg via ORAL
  Filled 2024-06-18: qty 1

## 2024-06-18 MED ORDER — LORATADINE 10 MG PO TABS
10.0000 mg | ORAL_TABLET | Freq: Every day | ORAL | Status: DC
  Administered 2024-06-18: 10 mg via ORAL
  Filled 2024-06-18: qty 1

## 2024-06-18 MED ORDER — METFORMIN HCL 500 MG PO TABS
500.0000 mg | ORAL_TABLET | Freq: Two times a day (BID) | ORAL | Status: DC
  Administered 2024-06-18: 500 mg via ORAL
  Filled 2024-06-18: qty 1

## 2024-06-18 MED ORDER — MELATONIN 5 MG PO TABS: 5.0000 mg | ORAL_TABLET | Freq: Every evening | ORAL | Status: DC | PRN

## 2024-06-18 NOTE — ED Notes (Signed)
 Meal tray provided. Tray checked for any potential hazards. Pt denies no additional needs at this time.

## 2024-06-18 NOTE — BH Assessment (Signed)
 This clinical research associate attempted to assess patient for her assessment, this clinical research associate softly tapped on patient to wake her up but patient jerked away from conservation officer, nature. This clinical research associate asked if the patient will participate in her assessment at this time and shook her head no. TTS to attempt to assess when patient is ready to engage.

## 2024-06-18 NOTE — TOC Initial Note (Signed)
 Transition of Care [facility]) - Initial/Assessment Note    Patient Details  Name: Crystal Black MRN: [id number] Date of Birth: 1995-11-16  Transition of Care [facility]) CM/SW Contact:    Sandford Diop L Embry Huss, LCSW Phone Number: 06/18/2024, 7:56 AM  Clinical Narrative:                  [facility] consult received for possible group home placement. This patient has a legal guardian [name]. The legal guardian is responsible for assessing the need for new group home placement.   Consult Screened out.        Patient Goals and CMS Choice            Expected Discharge Plan and Services                                              Prior Living Arrangements/Services                       Activities of Daily Living      Permission Sought/Granted                  Emotional Assessment              Admission diagnosis:  Psych Eval Patient Active Problem List   Diagnosis Date Noted   Delusions (HCC) 06/07/2024   History of posttraumatic stress disorder (PTSD) 06/07/2024   Suicide attempt (HCC) 06/07/2024   Deliberate self-cutting 06/07/2024   Homicidal ideations 06/07/2024   Thrombosed hemorrhoids 07/18/2023   Abdominal pain 07/18/2023   Sore throat 07/10/2023   Bipolar I disorder, most recent episode mixed (HCC) 07/07/2023   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   Suicidal ideation 06/07/2022   HIV disease (HCC) 02/21/2019   Polysubstance abuse (HCC) 04/18/2018   Borderline personality disorder (HCC) 07/18/2016   PTSD (post-traumatic stress disorder) 11/21/2013   PCP:  Ziglar, Susan K, MD Pharmacy:   [facility] - [place] , [place] - [address] [place]  [place] [address] Phone: [phone] Fax: [phone]  [facility] [address] [place] [address] Phone: [phone] Fax: [phone]     Social Drivers of Health (SDOH) Social  History: SDOH Screenings   Food Insecurity: No Food Insecurity (10/03/2023)   Received from [facility]  Housing: Low Risk  (11/02/2023)   Received from [facility]  Transportation Needs: No Transportation Needs (10/03/2023)   Received from [facility]  Utilities: Not At Risk (10/03/2023)   Received from [facility]  Alcohol Screen: Low Risk (07/18/2023)  Depression (PHQ2-9): Low Risk (05/04/2023)  Financial Resource Strain: Low Risk  (10/03/2023)   Received from [facility]  Social Connections: Moderately Isolated (07/05/2023)  Tobacco Use: High Risk (06/18/2024)   SDOH Interventions:     Readmission Risk Interventions     No data to display

## 2024-06-18 NOTE — ED Notes (Signed)
 Spoke with [name] [phone] and updated her on plan for discharge. Legal guardian ok with plan and able to provide phone number for group home owner, Garnetta [phone]. Spoke with Garnetta who is arranging transport back to group home with ETA between 1500 and 1600.

## 2024-06-18 NOTE — BH Assessment (Signed)
 Writer called and spoke to patient's legal guardian, [name] [phone]. Asberry reports patient has been in an out of the ED for the past few months with the same complaint. It's always someone else and never her. She states patient behaves this way when things do not go her way. Asberry reports she will give the group home owner a call; however, she is perfectly fine with patient returning back to the group home.

## 2024-06-18 NOTE — ED Provider Notes (Signed)
 28 year old female who is brought in overnight due to concern of self-harm.  She was seen by psychiatry this morning, and has been cleared from their standpoint.  Patient without any further medical complaints or concerns at this time we will have her discharged back to the group home at this time.   Fernand Rossie HERO, MD 06/18/24 [phone]

## 2024-06-18 NOTE — ED Notes (Signed)
 Pt legal guardian called with no answer. HIPAA compliant VM left for call to be returned.

## 2024-06-18 NOTE — Discharge Instructions (Signed)
 You were seen today due to concern of self-harm behavior.  At this time you have been cleared by our psychiatry team, you may return back home.  If you have any worsening thoughts of self-harm please return to the emergency department immediately for further medical management.

## 2024-06-18 NOTE — ED Notes (Signed)
 Pt safe for discharge at this time. Discharge instructions reviewed and group home staff verbalized understanding of discharge instructions. Staff denies any questions. Pt ambulatory out of department with steady gait dressed in appropriate clothing to be driven back to group home by staff.

## 2024-06-18 NOTE — ED Notes (Signed)
 TTS at the bedside for pt evaluation. Pt pulled away and refuses to speak.

## 2024-06-18 NOTE — ED Notes (Signed)
 Report given to receiving RN and pt moved to [facility] with security and ED Tech in wheelchair. Tolerated well.

## 2024-06-18 NOTE — ED Notes (Signed)
 Pt belongings:  Solicitor dress Black shoes White bra Brown pants [employer] Pack of cigarettes
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
## 2024-06-18 NOTE — ED Notes (Signed)
 ED MD at the bedside for pt evaluation. Pt calm and cooperative.

## 2024-06-18 NOTE — ED Notes (Signed)
Vol /psych consult pending 
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
## 2024-06-18 NOTE — ED Notes (Signed)
 Attempted to call legal guardian, [name] [phone], for update on pt plan for discharge. No answer, waiting for call back.
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
## 2024-06-19 ENCOUNTER — Encounter: Payer: Self-pay | Admitting: Emergency Medicine

## 2024-06-19 ENCOUNTER — Other Ambulatory Visit: Payer: Self-pay

## 2024-06-19 ENCOUNTER — Emergency Department
Admission: EM | Admit: 2024-06-19 | Discharge: 2024-06-20 | Disposition: A | Discharge status: Home / Self Care | Payer: MEDICAID

## 2024-06-19 ENCOUNTER — Emergency Department: Payer: MEDICAID

## 2024-06-19 DIAGNOSIS — R443 Hallucinations, unspecified: Secondary | ICD-10-CM | POA: Diagnosis present

## 2024-06-19 DIAGNOSIS — R45851 Suicidal ideations: Secondary | ICD-10-CM | POA: Diagnosis not present

## 2024-06-19 DIAGNOSIS — F319 Bipolar disorder, unspecified: Secondary | ICD-10-CM | POA: Diagnosis not present

## 2024-06-19 DIAGNOSIS — F3177 Bipolar disorder, in partial remission, most recent episode mixed: Secondary | ICD-10-CM | POA: Insufficient documentation

## 2024-06-19 DIAGNOSIS — Z8659 Personal history of other mental and behavioral disorders: Secondary | ICD-10-CM

## 2024-06-19 DIAGNOSIS — F603 Borderline personality disorder: Secondary | ICD-10-CM | POA: Diagnosis not present

## 2024-06-19 DIAGNOSIS — J45909 Unspecified asthma, uncomplicated: Secondary | ICD-10-CM | POA: Insufficient documentation

## 2024-06-19 DIAGNOSIS — F6089 Other specific personality disorders: Secondary | ICD-10-CM | POA: Diagnosis not present

## 2024-06-19 DIAGNOSIS — F329 Major depressive disorder, single episode, unspecified: Secondary | ICD-10-CM | POA: Diagnosis not present

## 2024-06-19 DIAGNOSIS — F4310 Post-traumatic stress disorder, unspecified: Secondary | ICD-10-CM | POA: Insufficient documentation

## 2024-06-19 DIAGNOSIS — Z7689 Persons encountering health services in other specified circumstances: Secondary | ICD-10-CM

## 2024-06-19 DIAGNOSIS — Z765 Malingerer [conscious simulation]: Secondary | ICD-10-CM

## 2024-06-19 DIAGNOSIS — F17210 Nicotine dependence, cigarettes, uncomplicated: Secondary | ICD-10-CM | POA: Diagnosis not present

## 2024-06-19 DIAGNOSIS — Z21 Asymptomatic human immunodeficiency virus [HIV] infection status: Secondary | ICD-10-CM | POA: Insufficient documentation

## 2024-06-19 DIAGNOSIS — F639 Impulse disorder, unspecified: Secondary | ICD-10-CM | POA: Diagnosis not present

## 2024-06-19 LAB — COMPREHENSIVE METABOLIC PANEL WITH GFR
ALT: 68 U/L — ABNORMAL HIGH (ref 0–44)
AST: 42 U/L — ABNORMAL HIGH (ref 15–41)
Albumin: 4.3 g/dL (ref 3.5–5.0)
Alkaline Phosphatase: 81 U/L (ref 38–126)
Anion gap: 9 (ref 5–15)
BUN: 15 mg/dL (ref 6–20)
CO2: 26 mmol/L (ref 22–32)
Calcium: 9.3 mg/dL (ref 8.9–10.3)
Chloride: 105 mmol/L (ref 98–111)
Creatinine, Ser: 0.8 mg/dL (ref 0.44–1.00)
GFR, Estimated: >60 mL/min
Glucose, Bld: 142 mg/dL — ABNORMAL HIGH (ref 70–99)
Potassium: 4.4 mmol/L (ref 3.5–5.1)
Sodium: 140 mmol/L (ref 135–145)
Total Bilirubin: <0.2 mg/dL (ref 0.0–1.2)
Total Protein: 7.4 g/dL (ref 6.5–8.1)

## 2024-06-19 LAB — CBC
HCT: 39.1 % (ref 36.0–46.0)
Hemoglobin: 12.9 g/dL (ref 12.0–15.0)
MCH: 31 pg (ref 26.0–34.0)
MCHC: 33 g/dL (ref 30.0–36.0)
MCV: 94 fL (ref 80.0–100.0)
Platelets: 249 K/uL (ref 150–400)
RBC: 4.16 MIL/uL (ref 3.87–5.11)
RDW: 13 % (ref 11.5–15.5)
WBC: 7 K/uL (ref 4.0–10.5)
nRBC: 0 % (ref 0.0–0.2)

## 2024-06-19 LAB — URINE DRUG SCREEN
Amphetamines: NEGATIVE
Barbiturates: NEGATIVE
Benzodiazepines: POSITIVE — AB
Cocaine: NEGATIVE
Fentanyl: NEGATIVE
Methadone Scn, Ur: NEGATIVE
Opiates: NEGATIVE
Tetrahydrocannabinol: NEGATIVE

## 2024-06-19 LAB — ETHANOL: Alcohol, Ethyl (B): <15 mg/dL

## 2024-06-19 LAB — POC URINE PREG, ED: Preg Test, Ur: NEGATIVE

## 2024-06-19 MED ORDER — NICOTINE 21 MG/24HR TD PT24: 21.0000 mg | MEDICATED_PATCH | Freq: Every day | TRANSDERMAL | Status: DC

## 2024-06-19 MED ORDER — CLONAZEPAM 0.125 MG PO TBDP
0.2500 mg | ORAL_TABLET | Freq: Every day | ORAL | Status: DC
  Administered 2024-06-19: 0.25 mg via ORAL
  Filled 2024-06-19: qty 2

## 2024-06-19 MED ORDER — LORATADINE 10 MG PO TABS
10.0000 mg | ORAL_TABLET | Freq: Every day | ORAL | Status: DC
  Administered 2024-06-19: 10 mg via ORAL
  Filled 2024-06-19: qty 1

## 2024-06-19 MED ORDER — METFORMIN HCL 500 MG PO TABS
500.0000 mg | ORAL_TABLET | Freq: Two times a day (BID) | ORAL | Status: DC
  Administered 2024-06-19: 500 mg via ORAL
  Filled 2024-06-19 (×2): qty 1

## 2024-06-19 MED ORDER — LINACLOTIDE 145 MCG PO CAPS
290.0000 ug | ORAL_CAPSULE | Freq: Every day | ORAL | Status: DC
  Administered 2024-06-19: 290 ug via ORAL
  Filled 2024-06-19 (×2): qty 2

## 2024-06-19 MED ORDER — ACETAMINOPHEN 325 MG PO TABS: 650.0000 mg | ORAL_TABLET | ORAL | Status: DC | PRN

## 2024-06-19 MED ORDER — ZIPRASIDONE MESYLATE 20 MG IM SOLR
20.0000 mg | Freq: Once | INTRAMUSCULAR | Status: AC
  Administered 2024-06-19: 20 mg via INTRAMUSCULAR
  Filled 2024-06-19: qty 20

## 2024-06-19 MED ORDER — ALBUTEROL SULFATE HFA 108 (90 BASE) MCG/ACT IN AERS: 2.0000 | INHALATION_SPRAY | RESPIRATORY_TRACT | Status: DC | PRN

## 2024-06-19 MED ORDER — FAMOTIDINE 20 MG PO TABS
20.0000 mg | ORAL_TABLET | Freq: Every day | ORAL | Status: DC
  Administered 2024-06-19: 20 mg via ORAL
  Filled 2024-06-19: qty 1

## 2024-06-19 MED ORDER — MONTELUKAST SODIUM 10 MG PO TABS
5.0000 mg | ORAL_TABLET | Freq: Every day | ORAL | Status: DC
  Administered 2024-06-19: 5 mg via ORAL
  Filled 2024-06-19: qty 1

## 2024-06-19 MED ORDER — MELATONIN 5 MG PO TABS
5.0000 mg | ORAL_TABLET | Freq: Every day | ORAL | Status: DC
  Administered 2024-06-19: 5 mg via ORAL
  Filled 2024-06-19: qty 1

## 2024-06-19 MED ORDER — ONDANSETRON HCL 4 MG PO TABS: 4.0000 mg | ORAL_TABLET | Freq: Three times a day (TID) | ORAL | Status: DC | PRN

## 2024-06-19 MED ORDER — NICOTINE POLACRILEX 2 MG MT GUM
2.0000 mg | CHEWING_GUM | OROMUCOSAL | Status: DC | PRN
  Administered 2024-06-19 (×5): 2 mg via ORAL
  Filled 2024-06-19 (×5): qty 1

## 2024-06-19 NOTE — ED Notes (Addendum)
 Pt changed into safety scrubs in this RN and Arianne, NT; no evidence of trauma noted: Belongings collected and include: Dress T-shirt Headband x 2  Shoes Cigarettes and lighter

## 2024-06-19 NOTE — ED Notes (Signed)
 Guardian Crystal Black,Crystal Black called and asked for an update. Guardian reported that she has someone that is able to come pick up the patient now, explained that the patient was ordered an re-eval for psych and I will call her back once it is determined when she is ready to leave. [name] stated that she will leave her phone on and listen for the call.

## 2024-06-19 NOTE — ED Notes (Signed)
 Spoke with Garnetta from group home regarding pt discharge. [name] states she does not feel safe with pt in home due to pattern of behavioral conflicts with other residents and staff resulting in repeat hospitalizations for psych consults. Requesting to have pt held in ED until after Christmas due to safety concerns. Charge notified.
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
## 2024-06-19 NOTE — ED Notes (Signed)
 Called dietary for breakfast tray to be sent

## 2024-06-19 NOTE — ED Notes (Signed)
 This RN called group home again and was able to get Crystal Black on the phone. [name] was informed that pt had been cleared for discharge and was ready to be picked up. [name] stated that she did not think pt was stable to come home because when she got there she was just going to do the same thing again. I asked her if she was refusing to come get the pt, she stated that she was not refusing to come get her, she was refusing to keep going back and forth with her. [name] was advised if she felt that way she needed to go through the legal process of evicting the pt. [name] said she was not going to do that but she just did not feel pt was stable to come back. This RN again informed [name] that pt had been medically and psych cleared and that if she refused to come get the pt, it is considered patient abandonment. [name] said that she was not refusing to come get the pt but a lot of her staff are off and the pt does not get along with the person working at the group home. [name] said she would attempt to find someone to come get the pt but will not be in a hurry to do it.
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
## 2024-06-19 NOTE — ED Notes (Signed)
 Breakfast tray provided.

## 2024-06-19 NOTE — Discharge Instructions (Signed)
 Cleared for psychiatric discharge home return to the ER for any other concerns

## 2024-06-19 NOTE — ED Provider Notes (Signed)
 "  [facility] Provider Note    Event Date/Time   First MD Initiated Contact with Patient 06/19/24 0127     (approximate)   History   Suicidal   HPI  Crystal Black is a 28 y.o. female with pmh of ADHD, anxiety, PTSD, depression, asthma, HIV who presents to the emergency department from her group home after being discharged from our facility with suicidal ideation.  Patient states that she was discharged back to the group home today and got into a verbal altercation with another resident and staff member.  She requested to return.  Her plan is to stab herself to death or run into traffic.  She does want help and wants to stay.  Denies any physical complaints      Physical Exam   Triage Vital Signs: ED Triage Vitals  Encounter Vitals Group     BP 06/19/24 0054 116/83     Girls Systolic BP Percentile --      Girls Diastolic BP Percentile --      Boys Systolic BP Percentile --      Boys Diastolic BP Percentile --      Pulse Rate 06/19/24 0054 79     Resp 06/19/24 0054 18     Temp 06/19/24 0054 98.4 F (36.9 C)     Temp Source 06/19/24 0054 Oral     SpO2 06/19/24 0054 100 %     Weight --      Height --      Head Circumference --      Peak Flow --      Pain Score 06/19/24 0055 7     Pain Loc --      Pain Education --      Exclude from Growth Chart --     Most recent vital signs: Vitals:   06/19/24 0054  BP: 116/83  Pulse: 79  Resp: 18  Temp: 98.4 F (36.9 C)  SpO2: 100%    Nursing Triage Note reviewed. Vital signs reviewed and patients oxygen saturation is normoxic  General: Patient is well nourished, well developed, awake and alert, resting comfortably in no acute distress Head: Normocephalic and atraumatic Eyes: Normal inspection, extraocular muscles intact, no conjunctival pallor Ear, nose, throat: Normal external exam Neck: Normal range of motion Respiratory: Patient is in no respiratory distress, lungs CTAB Cardiovascular: Patient  is not tachycardic, RRR without murmur appreciated GI: Abd SNT with no guarding or rebound  Back: Normal inspection of the back with good strength and range of motion throughout all ext Extremities: pulses intact with good cap refills, no LE pitting edema or calf tenderness Neuro: The patient is alert and oriented to person, place, and time, appropriately conversive, with 5/5 bilat UE/LE strength, no gross motor or sensory defects noted. Coordination appears to be adequate. Skin: Warm, dry, and intact Psych: + SI, no HI or AVHs  ED Results / Procedures / Treatments   Labs (all labs ordered are listed, but only abnormal results are displayed) Labs Reviewed  COMPREHENSIVE METABOLIC PANEL WITH GFR - Abnormal; Notable for the following components:      Result Value   Glucose, Bld 142 (*)    AST 42 (*)    ALT 68 (*)    All other components within normal limits  URINE DRUG SCREEN - Abnormal; Notable for the following components:   Benzodiazepines POSITIVE (*)    All other components within normal limits  ETHANOL  CBC  POC URINE PREG, ED  EKG None  RADIOLOGY X-ray left hand: No acute abnormality on my independent review interpretation radiologist agrees X-ray wrist of left: No acute abnormality on my independent review interpretation radiologist agrees    PROCEDURES:  Critical Care performed: No  Procedures   MEDICATIONS ORDERED IN ED: Medications  albuterol  (VENTOLIN  HFA) 108 (90 Base) MCG/ACT inhaler 2 puff (has no administration in time range)  loratadine  (CLARITIN ) tablet 10 mg (has no administration in time range)  clonazepam  (KLONOPIN ) disintegrating tablet 0.25 mg (has no administration in time range)  famotidine  (PEPCID ) tablet 20 mg (has no administration in time range)  melatonin tablet 5 mg (has no administration in time range)  linaclotide  (LINZESS ) capsule 290 mcg (has no administration in time range)  metFORMIN  (GLUCOPHAGE ) tablet 500 mg (has no  administration in time range)  montelukast  (SINGULAIR ) tablet 5 mg (has no administration in time range)  acetaminophen  (TYLENOL ) tablet 650 mg (has no administration in time range)  ondansetron  (ZOFRAN ) tablet 4 mg (has no administration in time range)  nicotine  (NICODERM CQ  - dosed in mg/24 hours) patch 21 mg (has no administration in time range)     IMPRESSION / MDM / ASSESSMENT AND PLAN / ED COURSE                                Differential diagnosis includes, but is not limited to: Organic psychiatric disorder, substance use, electrolyte derangement pregnancy, malingering   ED course: Unfortunate situation will female who was just discharged from our facility today returns with suicidal ideation.  She does want to stay voluntarily and I do not think she requires an involuntary hold at this time.  She is not pregnant and no profound electrolyte derangements no anemia.  She initially complained of left hand pain to triage and x-rays were obtained which demonstrated no acute abnormality.  She denies any hand pain to me.  At this time patient is appropriate for psychiatric disposition.  TOC may need to be involved if she is psychiatrically cleared as it seems as if this group home may not be a good fit  The patient has been placed in psychiatric observation due to the need to provide a safe environment for the patient while obtaining psychiatric consultation and evaluation, as well as ongoing medical and medication management to treat the patient's condition.  The patient has not been placed under full IVC at this time.   Clinical Course as of 06/19/24 0207  Wed Jun 19, 2024  0205 Preg Test, Ur: NEGATIVE Not pregnant [HD]  0206 cbc No acute anemia or leukocytosis [HD]  0206 Comprehensive metabolic panel(!) No profound electrolyte derangements, no elevated bilirubin [HD]  0206 Urine Drug Screen(!) Only benzodiazepines [HD]    Clinical Course User Index [HD] Nicholaus Rolland BRAVO, MD    -- Risk: 5 This patient has a high risk of morbidity due to further diagnostic testing or treatment. Rationale: This patients evaluation and management involve a high risk of morbidity due to the potential severity of presenting symptoms, need for diagnostic testing, and/or initiation of treatment that may require close monitoring. The differential includes conditions with potential for significant deterioration or requiring escalation of care. Treatment decisions in the ED, including medication administration, procedural interventions, or disposition planning, reflect this level of risk. COPA: 5 The patient has the following acute or chronic illness/injury that poses a possible threat to life or bodily function: [X] : The patient has a potentially  serious acute condition or an acute exacerbation of a chronic illness requiring urgent evaluation and management in the Emergency Department. The clinical presentation necessitates immediate consideration of life-threatening or function-threatening diagnoses, even if they are ultimately ruled out.   FINAL CLINICAL IMPRESSION(S) / ED DIAGNOSES   Final diagnoses:  Suicidal ideation  Frequent patient in emergency department     Rx / DC Orders   ED Discharge Orders     None        Note:  This document was prepared using Dragon voice recognition software and may include unintentional dictation errors.   Nicholaus Rolland BRAVO, MD 06/19/24 [phone]  "
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
## 2024-06-19 NOTE — ED Notes (Signed)
 Charge RN informed by primary RN that group home is requesting to leave pt here until after Christmas. This RN attempted to call group home to inform them that they cannot leave pt here as she is cleared and ready for discharge.

## 2024-06-19 NOTE — ED Triage Notes (Signed)
 [facility] Report:   RN: Skylee  Pt brought to [facility] from group home [facility] for HI towards group home staff. Per [facility], pts glucose is 131 and group home not able to bring pts insulin  so pt needed to be transferred to ED.    Legal Guardian: [name] [phone] On-call social worker, Thresa Krauss contacted and updated on pts status from [facility] to ED.

## 2024-06-19 NOTE — ED Notes (Signed)
 Snack given.

## 2024-06-19 NOTE — ED Notes (Signed)
Vol /psych consult pending 
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
## 2024-06-19 NOTE — ED Notes (Signed)
 Attempted to call group home staff, [name] Budge [phone], [phone] without answer. Waiting for call back.

## 2024-06-19 NOTE — BH Assessment (Signed)
 Patient was deferred to [facility] for a telepsych assessment. The assigned care coordinator will provide updates regarding the scheduling of the assessment. [facility] coordinator can be reached at [phone] for further information on the timing of the telepsych evaluation.
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
## 2024-06-19 NOTE — ED Notes (Signed)
 Attempted to call group home staff, [name] Budge [phone], at this time to discuss plan for discharge. No answer, waiting for call back.

## 2024-06-19 NOTE — ED Notes (Signed)
 Patient pending d/c from ER, called guardian to arrange a pick up time. No answer, left message on machine to call back.

## 2024-06-19 NOTE — BH Assessment (Signed)
 This Clinical research associate contacted [facility] via phone to request an assessment, request has been made, assessment is currently pending
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
## 2024-06-19 NOTE — ED Notes (Signed)
 Pt ambulatory to XR without difficulty, cooperative and calm.

## 2024-06-19 NOTE — ED Notes (Signed)
 Spoke with [name] Budge [phone] regarding update on pt transport back to group home. [name] states she will be sending staff member to pick up Ber[place]ce at 2230. Charge made aware.

## 2024-06-20 ENCOUNTER — Other Ambulatory Visit: Payer: Self-pay

## 2024-06-20 ENCOUNTER — Emergency Department
Admission: EM | Admit: 2024-06-20 | Discharge: 2024-06-20 | Disposition: A | Discharge status: Home / Self Care | Payer: MEDICAID | Source: Home / Self Care | Attending: Emergency Medicine | Admitting: Emergency Medicine

## 2024-06-20 DIAGNOSIS — F639 Impulse disorder, unspecified: Secondary | ICD-10-CM | POA: Insufficient documentation

## 2024-06-20 DIAGNOSIS — R4587 Impulsiveness: Secondary | ICD-10-CM

## 2024-06-20 DIAGNOSIS — Z765 Malingerer [conscious simulation]: Secondary | ICD-10-CM | POA: Insufficient documentation

## 2024-06-20 DIAGNOSIS — J45909 Unspecified asthma, uncomplicated: Secondary | ICD-10-CM | POA: Insufficient documentation

## 2024-06-20 DIAGNOSIS — F6089 Other specific personality disorders: Secondary | ICD-10-CM | POA: Insufficient documentation

## 2024-06-20 DIAGNOSIS — Z21 Asymptomatic human immunodeficiency virus [HIV] infection status: Secondary | ICD-10-CM | POA: Insufficient documentation

## 2024-06-20 DIAGNOSIS — F329 Major depressive disorder, single episode, unspecified: Secondary | ICD-10-CM | POA: Insufficient documentation

## 2024-06-20 DIAGNOSIS — R4689 Other symptoms and signs involving appearance and behavior: Secondary | ICD-10-CM

## 2024-06-20 MED ORDER — DIVALPROEX SODIUM ER 250 MG PO TB24
500.0000 mg | ORAL_TABLET | Freq: Two times a day (BID) | ORAL | Status: DC
  Administered 2024-06-20: 500 mg via ORAL
  Filled 2024-06-20: qty 2

## 2024-06-20 MED ORDER — ALBUTEROL SULFATE HFA 108 (90 BASE) MCG/ACT IN AERS: 2.0000 | INHALATION_SPRAY | RESPIRATORY_TRACT | Status: DC | PRN

## 2024-06-20 MED ORDER — FAMOTIDINE 20 MG PO TABS
20.0000 mg | ORAL_TABLET | Freq: Every day | ORAL | Status: DC
  Administered 2024-06-20: 20 mg via ORAL
  Filled 2024-06-20: qty 1

## 2024-06-20 MED ORDER — LORATADINE 10 MG PO TABS
10.0000 mg | ORAL_TABLET | Freq: Every day | ORAL | Status: DC
  Administered 2024-06-20: 10 mg via ORAL
  Filled 2024-06-20: qty 1

## 2024-06-20 MED ORDER — DOLUTEGRAVIR SODIUM 50 MG PO TABS
50.0000 mg | ORAL_TABLET | Freq: Every day | ORAL | Status: DC
  Administered 2024-06-20: 50 mg via ORAL
  Filled 2024-06-20: qty 1

## 2024-06-20 MED ORDER — OLANZAPINE 10 MG PO TABS
10.0000 mg | ORAL_TABLET | Freq: Every day | ORAL | Status: DC
  Administered 2024-06-20: 10 mg via ORAL
  Filled 2024-06-20: qty 1

## 2024-06-20 MED ORDER — FLUOXETINE HCL 10 MG PO CAPS
30.0000 mg | ORAL_CAPSULE | Freq: Every day | ORAL | Status: DC
  Administered 2024-06-20: 30 mg via ORAL
  Filled 2024-06-20: qty 3

## 2024-06-20 MED ORDER — HYDROXYZINE HCL 25 MG PO TABS: 25.0000 mg | ORAL_TABLET | Freq: Every day | ORAL | Status: DC | PRN

## 2024-06-20 MED ORDER — MONTELUKAST SODIUM 10 MG PO TABS: 5.0000 mg | ORAL_TABLET | Freq: Every day | ORAL | Status: DC

## 2024-06-20 MED ORDER — FLUOXETINE HCL 10 MG PO CAPS: 10.0000 mg | ORAL_CAPSULE | Freq: Two times a day (BID) | ORAL | Status: DC

## 2024-06-20 MED ORDER — LINACLOTIDE 145 MCG PO CAPS
290.0000 ug | ORAL_CAPSULE | Freq: Every day | ORAL | Status: DC
  Administered 2024-06-20: 290 ug via ORAL
  Filled 2024-06-20: qty 2

## 2024-06-20 MED ORDER — CLONAZEPAM 0.125 MG PO TBDP
0.2500 mg | ORAL_TABLET | Freq: Every day | ORAL | Status: DC
  Administered 2024-06-20: 0.25 mg via ORAL
  Filled 2024-06-20: qty 2

## 2024-06-20 MED ORDER — NICOTINE POLACRILEX 2 MG MT GUM
2.0000 mg | CHEWING_GUM | OROMUCOSAL | Status: DC
  Administered 2024-06-20 (×2): 2 mg via ORAL
  Filled 2024-06-20 (×2): qty 1

## 2024-06-20 MED ORDER — MELATONIN 5 MG PO TABS: 5.0000 mg | ORAL_TABLET | Freq: Every evening | ORAL | Status: DC | PRN

## 2024-06-20 MED ORDER — METFORMIN HCL 500 MG PO TABS
500.0000 mg | ORAL_TABLET | Freq: Two times a day (BID) | ORAL | Status: DC
  Administered 2024-06-20: 500 mg via ORAL
  Filled 2024-06-20: qty 1

## 2024-06-20 MED ORDER — GUANFACINE HCL 1 MG PO TABS: 1.0000 mg | ORAL_TABLET | Freq: Every day | ORAL | Status: DC

## 2024-06-20 NOTE — ED Notes (Signed)
 Pt discharging back to [facility]. Pt agreeable to it and verbalized understanding of same. Personal belongings given back to pt. Discharge teaching done with both pt and [facility] staff in room and they both verbalized understanding. Stable, ambulatory and in NAD at this time. Escorted out to lobby

## 2024-06-20 NOTE — TOC Initial Note (Addendum)
 Transition of Care [facility]) - Initial/Assessment Note    Patient Details  Name: Crystal Black MRN: [id number] Date of Birth: 12-15-1995  Transition of Care [facility]) CM/SW Contact:    Satin Boal L Reyanne Hussar, LCSW Phone Number: 06/20/2024, 7:44 AM  Clinical Narrative:                  [facility] consult received for SNF placement. Consult screened out. Request for SNF placement not appropriate. Patient resides in a group home and she has a legal guardian. If alternate placement is needed, then her legal guardian will need to pursue options.   CSW contacted the on-call guardian [phone], with concerns that patient is medically and psychiatrically stable for discharge. The guardian advised that he has received calls all night and he last spoke with Crystal Black at 3:45am. He advised that he will give her a call but he has no other solutions to provide.   CSW spoke with Crystal Black. She stated that patient is not the Crystal Black she knows. She stated that Crystal Black had been with her for 7 years. Crystal Black had expressed concerns about 4 months ago and hen she started going back and forth to the hospital. Crystal Black makes erroneous comments and false accusations when she does not get her way. Crystal Black stated that Crystal Black Legal Guardian moved her from the group home and found another placement for her. After some time, Crystal Black wanted to return and Crystal Black allowed her to. She did well for a weeks and then her behavior changed.   Crystal Black advised that she does not know which house Crystal Black can go to since she has problems with some of the staff members. Crystal Black requested that CSW bring Crystal Black the phone. Crystal Black spoke to Crystal Black. Crystal Black stated that she is doing well and she wants to return. Crystal Black was open to this and she spoke to Crystal Black very gently. Crystal Black advised that last night she just wanted to do what she wanted to do. CSW heard Crystal Black advising which house Crystal Black would go to and Crystal Black was in agreement. She asked  Crystal Black to be cooperative as it is the holiday and everyone is doing their best. Crystal Black began to cry which upset Crystal Black. Crystal Black asked her not to cry and she stated, we will fix it. Crystal Black is very reasonable.   Crystal Black stated that she is doing her best to arrange transportation but advised that it will take some time as it is a holiday and she is short staffed. She appeared to be much more reasonable and compliant after CSW spoke with her. Patient will return to the Group Home.   9:15am: The patient's Legal Guardian has expressed frustration with the current situation. I have contacted the guardian by phone, and he reports that he has also contacted the owner of the patient's residence. The owner has agreed to assist with patient pickup and is currently managing staffing issues to facilitate transport.  At this time, it is recommended to pause further action briefly to allow the guardian and group home owner time to coordinate the patient's discharge and pickup from the hospital. Continued monitoring and communication with the guardian and facility staff will be maintained to ensure safe transfer and appropriate support arrangements by ICM.   *There is no need for duplicate calls; one coordinated communication is sufficient.      12:26PM: CSW followed up with Crystal Black. She stated that she sent a staff member to the ED to  pick patient up. CSW advised that CSW has not received an update from the ED. She stated that she is in the middle of preparing for the holiday. She advised that when she is finished she will come and pick patient up herself if patient is still in the ED. Crystal Black continued to advise that she is picking patient up today. It is clear that Crystal Black is intentionally delaying the discharge.    2:55pm: [facility] complaint made regarding the group home. Complaint was emailed to [email or web address]   3:20: CSW received a call from Crystal Black. Crystal Black advised that she was on  her way to the ED to discharge patient.    Patient Goals and CMS Choice            Expected Discharge Plan and Services                                              Prior Living Arrangements/Services                       Activities of Daily Living      Permission Sought/Granted                  Emotional Assessment              Admission diagnosis:  Hallucinations Patient Active Problem List   Diagnosis Date Noted   Bipolar disorder, in partial remission, most recent episode mixed (HCC) 06/19/2024   Malingering 06/19/2024   Frequent patient in emergency department 06/19/2024   Feels unsafe at home 06/18/2024   Delusions (HCC) 06/07/2024   History of posttraumatic stress disorder (PTSD) 06/07/2024   Suicide attempt (HCC) 06/07/2024   Deliberate self-cutting 06/07/2024   Homicidal ideations 06/07/2024   Thrombosed hemorrhoids 07/18/2023   Abdominal pain 07/18/2023   Sore throat 07/10/2023   Bipolar I disorder, most recent episode mixed (HCC) 07/07/2023   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 [place]  [place] [address] Phone: [phone] Fax: [phone]  [facility] [address] [place] [address] Phone: [phone] Fax: [phone]     Social Drivers of Health (SDOH) Social History: SDOH Screenings   Food Insecurity: Low Risk (12/25/2023)   [facility]  Housing: Low Risk  (11/02/2023)   Received from [facility]  Transportation Needs: Low Risk (12/25/2023)   Received from [facility]  Utilities: Low Risk (12/25/2023)   Received [facility]  Alcohol Screen: Low Risk (07/18/2023)  Depression (PHQ2-9): Low Risk (05/04/2023)  Financial Resource Strain: Low Risk (12/27/2023)   Received from [facility]  Physical Activity: Inactive (12/27/2023)   Received from [facility]  Social Connections: Socially Isolated (12/27/2023)   Received from [facility]  Stress: Stress Concern Present (12/27/2023)   Received from [facility]  Tobacco Use: High Risk (06/20/2024)   SDOH Interventions:     Readmission Risk Interventions  No data to display

## 2024-06-20 NOTE — ED Notes (Signed)
 Crystal Black called back and states that she don't have anyone that can come get the patient, Crystal Black stated that she is going to try to call the sherriff's office to see if they will bring her to the group home because she has her grandchildren with her and does not want to transport Crystal Black with the grandchildren in the same car. Crystal Black reports that she will call me back.

## 2024-06-20 NOTE — ED Notes (Signed)
VOL/pending placement 

## 2024-06-20 NOTE — ED Notes (Signed)
 Spoke with Guardian, Crystal Black is arranging transport at this time and will call me back with a time that they will come and get her.

## 2024-06-20 NOTE — ED Notes (Signed)
 Lunch tray provided to pt.

## 2024-06-20 NOTE — ED Notes (Signed)
 Patient is vol pending placement
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
## 2024-06-20 NOTE — ED Notes (Signed)
 Belongings: Red shirt Red dress Black shoes Orange head band Cigarettes Lighter
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
## 2024-06-20 NOTE — ED Notes (Signed)
 Crystal Black stated a woman named Crystal Black will pick her up within an hour from now. [phone] is Crystal Black number.
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
## 2024-06-20 NOTE — ED Provider Notes (Signed)
 Emergency Medicine Observation Re-evaluation Note  Elvina Bosch is a 28 y.o. female, seen on rounds today.  Pt initially presented to the ED for complaints of Hallucinations (Chronic noncommanding hallucinations, states I was taken to a different group home and I didn't want to be there so I came here. Denies SI/HI)  Currently, the patient is resting comfortably.  Physical Exam  BP 126/71 (BP Location: Right Arm)   Pulse 84   Temp 98.2 F (36.8 C) (Oral)   Resp 18   Ht 5' 3 (1.6 m)   Wt 90 kg   LMP 06/08/2024   SpO2 96%   BMI 35.15 kg/m  General: No acute distress Cardiac: Well-perfused extremities Lungs: No respiratory distress Psych: Appropriate mood and affect  ED Course / MDM  EKG:   I have reviewed the labs performed to date as well as medications administered while in observation.  Recent changes in the last 24 hours include none.  Plan  Current plan is for discharge.   Aisea Bouldin K, MD 06/20/24 [phone]

## 2024-06-20 NOTE — ED Notes (Addendum)
 Pt denies SI/HI/AVH on assessment. Reports she is going home today. Per TOC pt going back to [facility] and [facility] owner arranging transport at this time.
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
## 2024-06-21 ENCOUNTER — Encounter: Payer: Self-pay | Admitting: Emergency Medicine

## 2024-06-21 ENCOUNTER — Other Ambulatory Visit: Payer: Self-pay

## 2024-06-21 ENCOUNTER — Emergency Department: Payer: MEDICAID

## 2024-06-21 DIAGNOSIS — R112 Nausea with vomiting, unspecified: Secondary | ICD-10-CM | POA: Diagnosis not present

## 2024-06-21 DIAGNOSIS — R509 Fever, unspecified: Secondary | ICD-10-CM | POA: Diagnosis not present

## 2024-06-21 DIAGNOSIS — M791 Myalgia, unspecified site: Secondary | ICD-10-CM | POA: Diagnosis not present

## 2024-06-21 DIAGNOSIS — R059 Cough, unspecified: Secondary | ICD-10-CM | POA: Diagnosis present

## 2024-06-21 DIAGNOSIS — R0981 Nasal congestion: Secondary | ICD-10-CM | POA: Insufficient documentation

## 2024-06-21 NOTE — ED Triage Notes (Signed)
 Pt to ED via [facility], states has had sore throat, cough, sneezing. Pt states that people at her group home have had flu and covid. Pt states symptoms started 3 days ago, however is the worst it has been. Pt with noted cough in triage.

## 2024-06-21 NOTE — ED Notes (Signed)
 Pt to ed from group off [employer] for flu like symptoms and cough. Pt is caox4, in no acute distress. HIV + worried about her immune system.

## 2024-06-22 ENCOUNTER — Emergency Department
Admission: EM | Admit: 2024-06-22 | Discharge: 2024-06-22 | Disposition: A | Discharge status: Home / Self Care | Payer: MEDICAID | Attending: Emergency Medicine | Admitting: Emergency Medicine

## 2024-06-22 ENCOUNTER — Other Ambulatory Visit (HOSPITAL_COMMUNITY): Payer: Self-pay

## 2024-06-22 ENCOUNTER — Other Ambulatory Visit: Payer: Self-pay

## 2024-06-22 DIAGNOSIS — R6889 Other general symptoms and signs: Secondary | ICD-10-CM

## 2024-06-22 LAB — RESP PANEL BY RT-PCR (RSV, FLU A&B, COVID)  RVPGX2
Influenza A by PCR: NEGATIVE
Influenza B by PCR: NEGATIVE
Resp Syncytial Virus by PCR: NEGATIVE
SARS Coronavirus 2 by RT PCR: NEGATIVE

## 2024-06-22 LAB — GROUP A STREP BY PCR: Group A Strep by PCR: NOT DETECTED

## 2024-06-22 MED ORDER — ACETAMINOPHEN 500 MG PO TABS
1000.0000 mg | ORAL_TABLET | Freq: Once | ORAL | Status: AC
  Administered 2024-06-22: 1000 mg via ORAL
  Filled 2024-06-22: qty 2

## 2024-06-22 MED ORDER — HYDROCOD POLI-CHLORPHE POLI ER 10-8 MG/5ML PO SUER
5.0000 mL | Freq: Once | ORAL | Status: AC
  Administered 2024-06-22: 5 mL via ORAL
  Filled 2024-06-22: qty 5

## 2024-06-22 MED ORDER — NICOTINE POLACRILEX 2 MG MT GUM
2.0000 mg | CHEWING_GUM | OROMUCOSAL | Status: DC | PRN
  Administered 2024-06-22: 2 mg via ORAL
  Filled 2024-06-22: qty 1

## 2024-06-22 MED ORDER — BENZONATATE 100 MG PO CAPS
100.0000 mg | ORAL_CAPSULE | Freq: Three times a day (TID) | ORAL | 0 refills | Status: AC | PRN
  Filled 2024-06-22: qty 20, 7d supply, fill #0

## 2024-06-22 MED ORDER — ONDANSETRON 4 MG PO TBDP
ORAL_TABLET | ORAL | 0 refills | Status: AC
  Filled 2024-06-22: qty 30, 10d supply, fill #0

## 2024-06-22 MED ORDER — ONDANSETRON 4 MG PO TBDP
4.0000 mg | ORAL_TABLET | Freq: Once | ORAL | Status: AC
  Administered 2024-06-22: 4 mg via ORAL
  Filled 2024-06-22: qty 1

## 2024-06-22 NOTE — ED Provider Notes (Signed)
 "  [facility] Provider Note    Event Date/Time   First MD Initiated Contact with Patient 06/22/24 [phone]     (approximate)   History   URI   HPI Kendalyn Cranfield is a 28 y.o. female from a group home with the legal guardian who presents for evaluation of about 3 days of viral symptoms including cough, body aches, intermittent fever, nasal congestion.  She has also had some nausea and vomiting.  She said multiple people in the group home have been ill with the flu.  She said it feels like that.  She says that she has not been able to keep anything down but she has an empty bag of food that she brought with her.     Physical Exam   Triage Vital Signs: ED Triage Vitals  Encounter Vitals Group     BP 06/21/24 2320 119/75     Girls Systolic BP Percentile --      Girls Diastolic BP Percentile --      Boys Systolic BP Percentile --      Boys Diastolic BP Percentile --      Pulse Rate 06/21/24 2320 (!) 109     Resp 06/21/24 2320 20     Temp 06/21/24 2320 98.4 F (36.9 C)     Temp Source 06/21/24 2320 Oral     SpO2 06/21/24 2320 97 %     Weight 06/21/24 2320 90 kg (198 lb 6.6 oz)     Height 06/21/24 2320 1.6 m (5' 3)     Head Circumference --      Peak Flow --      Pain Score 06/21/24 2319 7     Pain Loc --      Pain Education --      Exclude from Growth Chart --     Most recent vital signs: Vitals:   06/21/24 2320 06/22/24 0238  BP: 119/75   Pulse: (!) 109   Resp: 20   Temp: 98.4 F (36.9 C)   SpO2: 97% 97%    General: Awake, appears ill from viral symptoms but not in acute distress and is pleasant and conversant. CV:  Good peripheral perfusion.  Mild tachycardia, regular rhythm. Resp:  Normal effort. Speaking easily and comfortably, no accessory muscle usage nor intercostal retractions.  Frequent thick sounding cough.  Lungs are clear to auscultation. Abd:  No distention.  Tolerating oral intake in the ED.   ED Results / Procedures /  Treatments   Labs (all labs ordered are listed, but only abnormal results are displayed) Labs Reviewed  RESP PANEL BY RT-PCR (RSV, FLU A&B, COVID)  [id number]  GROUP A STREP BY PCR     RADIOLOGY I independently viewed and interpreted the patient's two-view chest x-ray and I see no evidence of pneumonia or pneumothorax.  I also read the radiologist's report, which confirmed no acute findings.   PROCEDURES:  Critical Care performed: No  Procedures    IMPRESSION / MDM / ASSESSMENT AND PLAN / ED COURSE  I reviewed the triage vital signs and the nursing notes.                              Differential diagnosis includes, but is not limited to, viral illness, community-acquired pneumonia, other bacterial infection such as strep  Patient's presentation is most consistent with acute presentation with potential threat to life or bodily function.  Labs/studies ordered:  Respiratory viral panel, group A strep, two-view chest x-ray  Interventions/Medications given:  Medications  ondansetron  (ZOFRAN -ODT) disintegrating tablet 4 mg (has no administration in time range)  acetaminophen  (TYLENOL ) tablet 1,000 mg (has no administration in time range)  chlorpheniramine-HYDROcodone (TUSSIONEX) 10-8 MG/5ML suspension 5 mL (has no administration in time range)  nicotine  polacrilex (NICORETTE ) gum 2 mg (has no administration in time range)    (Note:  hospital course my include additional interventions and/or labs/studies not listed above.)   Patient has flulike symptoms with extensive flu positive contacts.  I think she probably has a false negative test tonight, but it does not change management.  We talked about conservative management and I provided medications above and prescriptions below.  She is from a group home with a legal guardian and they cannot pick her up tonight so she will need to stay here until she can be picked up.  I provided nicotine  gum for her because she keeps wanting to go  outside to smoke.  She agrees with the plan and understands she cannot leave         FINAL CLINICAL IMPRESSION(S) / ED DIAGNOSES   Final diagnoses:  Flu-like symptoms     Rx / DC Orders   ED Discharge Orders          Ordered    benzonatate  (TESSALON ) 100 MG capsule  3 times daily PRN        06/22/24 0338    ondansetron  (ZOFRAN -ODT) 4 MG disintegrating tablet        06/22/24 9660             Note:  This document was prepared using Dragon voice recognition software and may include unintentional dictation errors.   Gordan Huxley, MD 06/22/24 [phone]  "

## 2024-06-22 NOTE — Discharge Instructions (Signed)
 You have been seen in the Emergency Department (ED) today for a likely viral illness.  Please drink plenty of clear fluids (water, Gatorade, chicken broth, etc).  You may use Tylenol and/or Motrin according to label instructions.  You can alternate between the two without any side effects.   Please follow up with your doctor as listed above.  Call your doctor or return to the Emergency Department (ED) if you are unable to tolerate fluids due to vomiting, have worsening trouble breathing, become extremely tired or difficult to awaken, or if you develop any other symptoms that concern you.

## 2024-06-27 ENCOUNTER — Emergency Department
Admission: EM | Admit: 2024-06-27 | Discharge: 2024-06-28 | Disposition: A | Discharge status: Other Acute Inpatient Hospital | Payer: MEDICAID | Source: Home / Self Care | Attending: Emergency Medicine | Admitting: Emergency Medicine

## 2024-06-27 ENCOUNTER — Other Ambulatory Visit: Payer: Self-pay

## 2024-06-27 DIAGNOSIS — X788XXA Intentional self-harm by other sharp object, initial encounter: Secondary | ICD-10-CM | POA: Diagnosis not present

## 2024-06-27 DIAGNOSIS — J45909 Unspecified asthma, uncomplicated: Secondary | ICD-10-CM | POA: Diagnosis not present

## 2024-06-27 DIAGNOSIS — F603 Borderline personality disorder: Secondary | ICD-10-CM

## 2024-06-27 DIAGNOSIS — F319 Bipolar disorder, unspecified: Secondary | ICD-10-CM | POA: Diagnosis not present

## 2024-06-27 DIAGNOSIS — S61512A Laceration without foreign body of left wrist, initial encounter: Secondary | ICD-10-CM | POA: Insufficient documentation

## 2024-06-27 DIAGNOSIS — R45850 Homicidal ideations: Secondary | ICD-10-CM

## 2024-06-27 DIAGNOSIS — S61519A Laceration without foreign body of unspecified wrist, initial encounter: Secondary | ICD-10-CM

## 2024-06-27 DIAGNOSIS — F17210 Nicotine dependence, cigarettes, uncomplicated: Secondary | ICD-10-CM | POA: Insufficient documentation

## 2024-06-27 DIAGNOSIS — Z21 Asymptomatic human immunodeficiency virus [HIV] infection status: Secondary | ICD-10-CM | POA: Diagnosis not present

## 2024-06-27 DIAGNOSIS — F4325 Adjustment disorder with mixed disturbance of emotions and conduct: Secondary | ICD-10-CM

## 2024-06-27 DIAGNOSIS — F4310 Post-traumatic stress disorder, unspecified: Secondary | ICD-10-CM

## 2024-06-27 DIAGNOSIS — R45851 Suicidal ideations: Secondary | ICD-10-CM | POA: Diagnosis not present

## 2024-06-27 LAB — COMPREHENSIVE METABOLIC PANEL WITH GFR
ALT: 48 U/L — ABNORMAL HIGH (ref 0–44)
AST: 37 U/L (ref 15–41)
Albumin: 4.2 g/dL (ref 3.5–5.0)
Alkaline Phosphatase: 80 U/L (ref 38–126)
Anion gap: 14 (ref 5–15)
BUN: 13 mg/dL (ref 6–20)
CO2: 21 mmol/L — ABNORMAL LOW (ref 22–32)
Calcium: 9.4 mg/dL (ref 8.9–10.3)
Chloride: 104 mmol/L (ref 98–111)
Creatinine, Ser: 0.71 mg/dL (ref 0.44–1.00)
GFR, Estimated: >60 mL/min
Glucose, Bld: 88 mg/dL (ref 70–99)
Potassium: 4.3 mmol/L (ref 3.5–5.1)
Sodium: 139 mmol/L (ref 135–145)
Total Bilirubin: 0.3 mg/dL (ref 0.0–1.2)
Total Protein: 7.4 g/dL (ref 6.5–8.1)

## 2024-06-27 LAB — CBC
HCT: 38.7 % (ref 36.0–46.0)
Hemoglobin: 12.9 g/dL (ref 12.0–15.0)
MCH: 30.9 pg (ref 26.0–34.0)
MCHC: 33.3 g/dL (ref 30.0–36.0)
MCV: 92.6 fL (ref 80.0–100.0)
Platelets: 283 K/uL (ref 150–400)
RBC: 4.18 MIL/uL (ref 3.87–5.11)
RDW: 13.2 % (ref 11.5–15.5)
WBC: 9.1 K/uL (ref 4.0–10.5)
nRBC: 0 % (ref 0.0–0.2)

## 2024-06-27 LAB — URINE DRUG SCREEN
Amphetamines: NEGATIVE
Barbiturates: NEGATIVE
Benzodiazepines: NEGATIVE
Cocaine: NEGATIVE
Fentanyl: NEGATIVE
Methadone Scn, Ur: NEGATIVE
Opiates: NEGATIVE
Tetrahydrocannabinol: NEGATIVE

## 2024-06-27 LAB — POC URINE PREG, ED: Preg Test, Ur: NEGATIVE

## 2024-06-27 LAB — ETHANOL: Alcohol, Ethyl (B): <15 mg/dL

## 2024-06-27 MED ORDER — OLANZAPINE 5 MG PO TABS: 5.0000 mg | ORAL_TABLET | Freq: Three times a day (TID) | ORAL | Status: DC | PRN

## 2024-06-27 MED ORDER — LORAZEPAM 1 MG PO TABS: 1.0000 mg | ORAL_TABLET | Freq: Three times a day (TID) | ORAL | Status: DC | PRN

## 2024-06-27 MED ORDER — NICOTINE POLACRILEX 2 MG MT GUM
2.0000 mg | CHEWING_GUM | OROMUCOSAL | Status: DC | PRN
  Administered 2024-06-27 – 2024-06-28 (×6): 2 mg via ORAL
  Filled 2024-06-27 (×6): qty 1

## 2024-06-27 MED ORDER — OLANZAPINE 10 MG IM SOLR: 5.0000 mg | Freq: Three times a day (TID) | INTRAMUSCULAR | Status: DC | PRN

## 2024-06-27 MED ORDER — DARUNAVIR-COBICISTAT 800-150 MG PO TABS
1.0000 | ORAL_TABLET | Freq: Every day | ORAL | Status: DC
  Administered 2024-06-27: 1 via ORAL
  Filled 2024-06-27 (×3): qty 1

## 2024-06-27 MED ORDER — DOLUTEGRAVIR SODIUM 50 MG PO TABS
50.0000 mg | ORAL_TABLET | Freq: Every day | ORAL | Status: DC
  Administered 2024-06-27: 50 mg via ORAL
  Filled 2024-06-27 (×2): qty 1

## 2024-06-27 MED ORDER — DARUNAVIR-COBICISTAT 800-150 MG PO TABS: 1.0000 | ORAL_TABLET | Freq: Every day | ORAL | Status: DC

## 2024-06-27 MED ORDER — LORAZEPAM 2 MG/ML IJ SOLN: 1.0000 mg | Freq: Three times a day (TID) | INTRAMUSCULAR | Status: DC | PRN

## 2024-06-27 MED ORDER — POLYETHYLENE GLYCOL 3350 17 G PO PACK
17.0000 g | PACK | Freq: Every day | ORAL | Status: DC
  Administered 2024-06-27: 17 g via ORAL
  Filled 2024-06-27: qty 1

## 2024-06-27 NOTE — ED Notes (Signed)
 Patient given snack at the bedside. No acute needs at this time.
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
## 2024-06-27 NOTE — ED Notes (Signed)
 Belongings: 1 vape, 1 charger, $6, 1 pair shoes, 1 pair jeans, grey sweater, blue shirt
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
## 2024-06-27 NOTE — ED Notes (Signed)
 Has superficial abrasions to left anterior forearm. No bleeding noted. Patient states did it with the lid of a can because she don't like her group home, states that they don't know how to deal with her and she don't know how to deal with them. No redness or bleeding noted to abrasions.

## 2024-06-27 NOTE — ED Notes (Signed)
 Home prescription of prezcobix  dropped off by group home staff at this time. Plan to transfer to [facility] sometime tomorrow. No available PD to transfer today due to holiday. Pt resting with RR even and unlabored without acute complaints at this time.

## 2024-06-27 NOTE — BH Assessment (Signed)
 Destination  Service Provider Request Status Services Address Phone Fax Patient Preferred  [facility] Patient Placement  Pending - Request Sent -- [facility] [place], [place] [phone] --  [facility]  Pending - Request Sent -- [address] [place] [address] [phone] --  [facility]  Pending - Request Sent -- [address] [place] [address] [phone] --  [facility]  Pending - Request Sent -- [address] [place], [place] [address] [phone] --  [facility]  Pending - Request Sent -- [address] [place] [address] [phone] --  [facility]  Pending - Request Sent -- [address] [place] [address] [phone] --  [facility]  Pending - Request Sent -- [address] [place] [address] [phone] --  [facility]  Pending - Request Sent -- [address], [place] [address] [phone] --  [facility]  Pending - Request Sent -- [place] [phone] --  [facility]  Pending - Request Sent -- [address] [place] [address] [phone] --  [facility]  Pending - Request Sent -- [address] [place] [address] [phone] --  [facility]  Pending - Request Sent -- [address] [place] [address] [phone] --  [facility]  Pending - Request Sent -- [address], [place] [address] [phone] --  [facility]  Pending - Request Sent -- [address] [place] [address] [phone] --

## 2024-06-27 NOTE — ED Notes (Signed)
 IVC /psych consult complete /recommend inpatient psych for safety and stabilization

## 2024-06-27 NOTE — BH Assessment (Signed)
 TTS spoke with group home [facility]) about [facility] accepting patient but needing seven day of her HIV medications to take with her. She stated she was going to have one of the staff members bring them to the hospital to take with her.

## 2024-06-27 NOTE — BH Assessment (Signed)
 Patient has been accepted to [facility].  Patient assigned to the adult unit Accepting physician is Dr. Millie Manners.  Call report to [phone] option 2.  Representative was [employer]   ER Staff is aware of it:  Randine, ER Secretary Ronnald FALCON., Patient's Nurse     Patient's Group Home [facility]) have been updated as well.  Address: [address] [place], [place] [address]

## 2024-06-27 NOTE — ED Notes (Signed)
 Hospital meal provided, pt tolerated w/o complaints.  Waste discarded appropriately.

## 2024-06-27 NOTE — ED Triage Notes (Signed)
 Pt presents for IVC. Per PD paperwork, pt is schizophrenic, HIV+, and Bipolar but has not taken medications. Attempted to harm self today with cutting (superficial lacerations/abrasions noted to left forearm). Pt endorsed to PD that she wanted to run into traffic and kill herself.

## 2024-06-27 NOTE — BH Assessment (Signed)
 Writer attempted to contact [name] (Legal Guardian) at [phone] to notify her of the patients arrival. A HIPAA-compliant voicemail was left.  Following the instructions on the Legal Guardians voicemail recording, the writer called [phone] and spoke with Lenon, the after-hours representative, to provide notification of the patients arrival to the ED.
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
## 2024-06-27 NOTE — ED Notes (Signed)
 Lunch tray provided to pt.
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
## 2024-06-27 NOTE — ED Notes (Signed)
 Meal tray provided. Tray checked for any potential hazards. Pt denies no additional needs at this time.

## 2024-06-28 NOTE — ED Notes (Signed)
 Pt safe for transfer to [facility] at this time with sheriff. Nichole RN called at [facility] to update her on ETA and pt not having received her morning HIV med due to medication not yet received from pharmacy. All questions answered and pt ambulatory out of department with [facility] at this time.

## 2024-06-28 NOTE — ED Provider Notes (Signed)
 Emergency Medicine Observation Re-evaluation Note  Crystal Black is a 29 y.o. female, seen on rounds today.  Pt initially presented to the ED for complaints of Psychiatric Evaluation Currently, the patient is resting.  Physical Exam  BP 104/60 (BP Location: Right Arm)   Pulse 80   Temp 97.7 F (36.5 C) (Oral)   Resp 16   Ht 1.6 m (5' 3)   Wt 90 kg   LMP 06/08/2024   SpO2 96%   BMI 35.15 kg/m  Physical Exam Gen:  No acute distress Resp:  Breathing easily and comfortably, no accessory muscle usage Neuro:  Moving all four extremities, no gross focal neuro deficits Psych:  Resting currently, calm when awake ED Course / MDM  EKG:   I have reviewed the labs performed to date as well as medications administered while in observation.  Recent changes in the last 24 hours include evaluation and placement plan.  Plan  Current plan is for transfer to [facility].    Gordan Huxley, MD 06/28/24 [phone]

## 2024-06-28 NOTE — ED Notes (Signed)
 IVC/pending [facility] admission

## 2024-06-28 NOTE — ED Notes (Signed)
Pt given breakfast tray and beverage.  

## 2024-06-28 NOTE — ED Provider Notes (Signed)
 Emergency Medicine Observation Re-evaluation Note  Crystal Black is a 29 y.o. female, seen on rounds today.  Pt initially presented to the ED for complaints of Psychiatric Evaluation  Currently, the patient is is no acute distress. Denies any concerns at this time.  Physical Exam  Blood pressure 112/84, pulse 95, temperature 97.9 F (36.6 C), temperature source Oral, resp. rate 19, height 5' 3 (1.6 m), weight 90 kg, last menstrual period 06/08/2024, SpO2 96%, unknown if currently breastfeeding.  Physical Exam: General: No apparent distress Pulm: Normal WOB Neuro: Moving all extremities Psych: Resting comfortably     ED Course / MDM     I have reviewed the labs performed to date as well as medications administered while in observation.  Recent changes in the last 24 hours include: No acute events overnight.  Plan   Current plan: Patient transferred to [facility] Patient is under full IVC at this time.    Suzanne Kirsch, MD 06/28/24 [phone]

## 2024-06-28 NOTE — ED Notes (Signed)
 EMTALA Reviewed by this RN.

## 2024-06-28 NOTE — ED Notes (Signed)
 IVC called c com for transport to [facility]

## 2024-07-05 ENCOUNTER — Other Ambulatory Visit: Payer: Self-pay

## 2024-07-30 ENCOUNTER — Ambulatory Visit: Payer: MEDICAID | Admitting: Student

## 2024-07-31 ENCOUNTER — Ambulatory Visit: Payer: MEDICAID | Admitting: Student

## 2024-07-31 ENCOUNTER — Encounter: Payer: Self-pay | Admitting: Student

## 2024-07-31 VITALS — BP 138/80 | HR 86 | Ht 63.0 in | Wt 206.5 lb

## 2024-07-31 DIAGNOSIS — J4520 Mild intermittent asthma, uncomplicated: Secondary | ICD-10-CM

## 2024-07-31 DIAGNOSIS — F3160 Bipolar disorder, current episode mixed, unspecified: Secondary | ICD-10-CM

## 2024-07-31 DIAGNOSIS — B20 Human immunodeficiency virus [HIV] disease: Secondary | ICD-10-CM

## 2024-07-31 DIAGNOSIS — E119 Type 2 diabetes mellitus without complications: Secondary | ICD-10-CM | POA: Insufficient documentation

## 2024-07-31 DIAGNOSIS — J45909 Unspecified asthma, uncomplicated: Secondary | ICD-10-CM | POA: Insufficient documentation

## 2024-07-31 DIAGNOSIS — Z794 Long term (current) use of insulin: Secondary | ICD-10-CM

## 2024-07-31 NOTE — Assessment & Plan Note (Signed)
 Rarely uses albuterol
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
## 2024-07-31 NOTE — Assessment & Plan Note (Signed)
 Going to a program for  on metformin  500 mg twice day, day program no insulin  then

## 2024-08-01 ENCOUNTER — Telehealth: Payer: Self-pay

## 2024-08-01 NOTE — Telephone Encounter (Signed)
 Attempted to contact Crystal Black caregiver at [facility] to speak top her regarding an appointment that was scheduled for patient with [facility] for August 26, 2024 at 3:30 PM. There was no answer to my phone call. Left voicemail message for Crystal to return call.

## 2024-10-28 ENCOUNTER — Ambulatory Visit: Payer: MEDICAID | Admitting: Student
